# Patient Record
Sex: Female | Born: 1945 | Race: White | Hispanic: No | Smoking: Never smoker
Health system: Southern US, Community
[De-identification: ages and names within clinical notes are randomized; demographics above are authoritative.]

## PROBLEM LIST (undated history)

## (undated) DIAGNOSIS — Z95810 Presence of automatic (implantable) cardiac defibrillator: Secondary | ICD-10-CM

## (undated) DIAGNOSIS — I517 Cardiomegaly: Secondary | ICD-10-CM

## (undated) DIAGNOSIS — E039 Hypothyroidism, unspecified: Secondary | ICD-10-CM

## (undated) DIAGNOSIS — E785 Hyperlipidemia, unspecified: Secondary | ICD-10-CM

## (undated) DIAGNOSIS — J42 Unspecified chronic bronchitis: Secondary | ICD-10-CM

## (undated) DIAGNOSIS — F329 Major depressive disorder, single episode, unspecified: Secondary | ICD-10-CM

## (undated) DIAGNOSIS — N289 Disorder of kidney and ureter, unspecified: Secondary | ICD-10-CM

## (undated) DIAGNOSIS — K219 Gastro-esophageal reflux disease without esophagitis: Secondary | ICD-10-CM

## (undated) DIAGNOSIS — K589 Irritable bowel syndrome without diarrhea: Secondary | ICD-10-CM

## (undated) DIAGNOSIS — I340 Nonrheumatic mitral (valve) insufficiency: Secondary | ICD-10-CM

## (undated) DIAGNOSIS — F419 Anxiety disorder, unspecified: Secondary | ICD-10-CM

## (undated) DIAGNOSIS — Z8674 Personal history of sudden cardiac arrest: Secondary | ICD-10-CM

## (undated) DIAGNOSIS — I10 Essential (primary) hypertension: Secondary | ICD-10-CM

## (undated) DIAGNOSIS — R51 Headache: Secondary | ICD-10-CM

## (undated) DIAGNOSIS — J189 Pneumonia, unspecified organism: Secondary | ICD-10-CM

## (undated) DIAGNOSIS — I509 Heart failure, unspecified: Secondary | ICD-10-CM

## (undated) DIAGNOSIS — F32A Depression, unspecified: Secondary | ICD-10-CM

## (undated) DIAGNOSIS — R011 Cardiac murmur, unspecified: Secondary | ICD-10-CM

## (undated) DIAGNOSIS — M1990 Unspecified osteoarthritis, unspecified site: Secondary | ICD-10-CM

## (undated) DIAGNOSIS — I480 Paroxysmal atrial fibrillation: Secondary | ICD-10-CM

## (undated) DIAGNOSIS — M419 Scoliosis, unspecified: Secondary | ICD-10-CM

## (undated) HISTORY — DX: Nonrheumatic mitral (valve) insufficiency: I34.0

## (undated) HISTORY — PX: APPENDECTOMY: SHX54

## (undated) HISTORY — PX: COLONOSCOPY: SHX174

## (undated) HISTORY — PX: FRACTURE SURGERY: SHX138

## (undated) HISTORY — DX: Hyperlipidemia, unspecified: E78.5

## (undated) HISTORY — PX: CARDIAC CATHETERIZATION: SHX172

## (undated) HISTORY — PX: HERNIA REPAIR: SHX51

## (undated) HISTORY — DX: Paroxysmal atrial fibrillation: I48.0

## (undated) HISTORY — PX: ABDOMINAL HYSTERECTOMY: SHX81

---

## 2001-04-02 ENCOUNTER — Ambulatory Visit (HOSPITAL_COMMUNITY): Admission: RE | Admit: 2001-04-02 | Discharge: 2001-04-02 | Payer: Self-pay | Admitting: Internal Medicine

## 2001-04-02 ENCOUNTER — Encounter: Payer: Self-pay | Admitting: Internal Medicine

## 2001-10-10 ENCOUNTER — Emergency Department (HOSPITAL_COMMUNITY): Admission: EM | Admit: 2001-10-10 | Discharge: 2001-10-11 | Payer: Self-pay | Admitting: Emergency Medicine

## 2001-10-10 ENCOUNTER — Encounter: Payer: Self-pay | Admitting: Emergency Medicine

## 2002-01-20 ENCOUNTER — Encounter: Payer: Self-pay | Admitting: Internal Medicine

## 2002-01-20 ENCOUNTER — Encounter: Admission: RE | Admit: 2002-01-20 | Discharge: 2002-01-20 | Payer: Self-pay | Admitting: Internal Medicine

## 2002-02-28 ENCOUNTER — Other Ambulatory Visit: Admission: RE | Admit: 2002-02-28 | Discharge: 2002-02-28 | Payer: Self-pay | Admitting: Internal Medicine

## 2002-03-15 ENCOUNTER — Encounter: Payer: Self-pay | Admitting: Emergency Medicine

## 2002-03-15 ENCOUNTER — Emergency Department (HOSPITAL_COMMUNITY): Admission: EM | Admit: 2002-03-15 | Discharge: 2002-03-15 | Payer: Self-pay | Admitting: Emergency Medicine

## 2002-06-05 ENCOUNTER — Encounter: Admission: RE | Admit: 2002-06-05 | Discharge: 2002-06-05 | Payer: Self-pay | Admitting: Internal Medicine

## 2002-06-05 ENCOUNTER — Encounter: Payer: Self-pay | Admitting: Internal Medicine

## 2004-02-09 ENCOUNTER — Encounter: Admission: RE | Admit: 2004-02-09 | Discharge: 2004-02-09 | Payer: Self-pay | Admitting: Family Medicine

## 2004-06-24 ENCOUNTER — Ambulatory Visit: Payer: Self-pay | Admitting: *Deleted

## 2004-07-08 ENCOUNTER — Ambulatory Visit: Payer: Self-pay | Admitting: Family Medicine

## 2004-12-07 ENCOUNTER — Ambulatory Visit: Payer: Self-pay | Admitting: Internal Medicine

## 2005-03-02 ENCOUNTER — Encounter: Admission: RE | Admit: 2005-03-02 | Discharge: 2005-03-02 | Payer: Self-pay | Admitting: Nephrology

## 2006-01-05 ENCOUNTER — Ambulatory Visit: Payer: Self-pay | Admitting: Internal Medicine

## 2006-01-27 ENCOUNTER — Encounter: Admission: RE | Admit: 2006-01-27 | Discharge: 2006-01-27 | Payer: Self-pay | Admitting: Internal Medicine

## 2007-07-04 ENCOUNTER — Encounter (INDEPENDENT_AMBULATORY_CARE_PROVIDER_SITE_OTHER): Payer: Self-pay | Admitting: *Deleted

## 2007-11-09 ENCOUNTER — Inpatient Hospital Stay (HOSPITAL_COMMUNITY): Admission: EM | Admit: 2007-11-09 | Discharge: 2007-11-23 | Payer: Self-pay | Admitting: Emergency Medicine

## 2007-11-14 ENCOUNTER — Encounter (INDEPENDENT_AMBULATORY_CARE_PROVIDER_SITE_OTHER): Payer: Self-pay | Admitting: Cardiology

## 2008-06-24 ENCOUNTER — Inpatient Hospital Stay (HOSPITAL_COMMUNITY): Admission: EM | Admit: 2008-06-24 | Discharge: 2008-07-02 | Payer: Self-pay | Admitting: Emergency Medicine

## 2008-08-28 ENCOUNTER — Ambulatory Visit (HOSPITAL_COMMUNITY): Admission: RE | Admit: 2008-08-28 | Discharge: 2008-08-28 | Payer: Self-pay | Admitting: Nephrology

## 2008-10-17 HISTORY — PX: CARDIAC DEFIBRILLATOR PLACEMENT: SHX171

## 2009-05-07 ENCOUNTER — Encounter: Admission: RE | Admit: 2009-05-07 | Discharge: 2009-05-07 | Payer: Self-pay | Admitting: Nephrology

## 2009-09-02 ENCOUNTER — Ambulatory Visit: Payer: Self-pay | Admitting: Internal Medicine

## 2009-09-02 ENCOUNTER — Inpatient Hospital Stay (HOSPITAL_COMMUNITY): Admission: EM | Admit: 2009-09-02 | Discharge: 2009-09-13 | Payer: Self-pay | Admitting: Emergency Medicine

## 2009-09-03 ENCOUNTER — Encounter: Payer: Self-pay | Admitting: Pulmonary Disease

## 2009-09-04 ENCOUNTER — Encounter: Payer: Self-pay | Admitting: Internal Medicine

## 2009-09-11 ENCOUNTER — Encounter: Payer: Self-pay | Admitting: Internal Medicine

## 2009-10-17 DIAGNOSIS — Z8674 Personal history of sudden cardiac arrest: Secondary | ICD-10-CM

## 2009-10-17 HISTORY — DX: Personal history of sudden cardiac arrest: Z86.74

## 2009-10-23 ENCOUNTER — Encounter (INDEPENDENT_AMBULATORY_CARE_PROVIDER_SITE_OTHER): Payer: Self-pay | Admitting: *Deleted

## 2009-12-03 ENCOUNTER — Encounter: Admission: RE | Admit: 2009-12-03 | Discharge: 2009-12-03 | Payer: Self-pay | Admitting: Nephrology

## 2009-12-28 DIAGNOSIS — E785 Hyperlipidemia, unspecified: Secondary | ICD-10-CM | POA: Insufficient documentation

## 2009-12-28 DIAGNOSIS — I10 Essential (primary) hypertension: Secondary | ICD-10-CM | POA: Insufficient documentation

## 2009-12-28 DIAGNOSIS — K589 Irritable bowel syndrome without diarrhea: Secondary | ICD-10-CM | POA: Insufficient documentation

## 2009-12-28 DIAGNOSIS — E039 Hypothyroidism, unspecified: Secondary | ICD-10-CM

## 2009-12-28 DIAGNOSIS — I499 Cardiac arrhythmia, unspecified: Secondary | ICD-10-CM | POA: Insufficient documentation

## 2009-12-28 DIAGNOSIS — I509 Heart failure, unspecified: Secondary | ICD-10-CM | POA: Insufficient documentation

## 2010-04-20 ENCOUNTER — Ambulatory Visit: Payer: Self-pay | Admitting: Internal Medicine

## 2010-04-20 DIAGNOSIS — Z95810 Presence of automatic (implantable) cardiac defibrillator: Secondary | ICD-10-CM

## 2010-04-20 DIAGNOSIS — I5022 Chronic systolic (congestive) heart failure: Secondary | ICD-10-CM | POA: Insufficient documentation

## 2010-07-01 ENCOUNTER — Encounter: Admission: RE | Admit: 2010-07-01 | Discharge: 2010-07-01 | Payer: Self-pay | Admitting: Nephrology

## 2010-07-23 ENCOUNTER — Encounter: Payer: Self-pay | Admitting: Internal Medicine

## 2010-10-13 ENCOUNTER — Encounter (INDEPENDENT_AMBULATORY_CARE_PROVIDER_SITE_OTHER): Payer: Self-pay | Admitting: *Deleted

## 2010-11-12 ENCOUNTER — Encounter: Payer: Self-pay | Admitting: Internal Medicine

## 2010-11-18 NOTE — Letter (Signed)
Summary: Appointment - Missed  [facility], [place]  [address] [place]   [place], [place] [address]   Phone: [phone]  Fax: [phone]     October 23, 2009 MRN: [id number]   JOHNIKA BRIGNOLA [address] [place], [place]  [address]   Dear Ms. Mahaffy,  [place] records indicate you missed your appointment on 09/23/09 [facility].                                    It is very important that we reach you to reschedule this appointment. We look forward to participating in your health care needs. Please contact us at the number listed above at your earliest convenience to reschedule this appointment.     Sincerely,   Luanna Cole Scheduling Team

## 2010-11-18 NOTE — Letter (Signed)
Summary: Device-Delinquent Phone Proofreader, [place]  [address] [place]   [place], [place] [address]   Phone: [phone]  Fax: [phone]     October 13, 2010 MRN: [id number]   [name] [address] [place], [place]  [address]   Dear Ms. Faulcon,  According to our records, you were scheduled for a device phone transmission on  07-22-2010.     We did not receive any results from this check.  If you transmitted on your scheduled day, please call us to help troubleshoot your system.  If you forgot to send your transmission, please send one upon receipt of this letter.  Thank you,  Shelly Bombard  October 13, 2010 3:40 PM   [facility] certified

## 2010-11-18 NOTE — Assessment & Plan Note (Signed)
Summary: pc2  Medications Added * COREG  * SIMVASTATIN  HYDROXYZINE HCL 10 MG TABS (HYDROXYZINE HCL) Out      Allergies Added: ! CODEINE ! DEMEROL  Visit Type:  Follow-up   History of Present Illness: [name] returns today for followup.  She is a pleasant 65 yo woman with a h/o VF arrest, s/p ICD implant.  She denies c/p, sob, or peripheral edema.  No intercurrent ICD therapies.    Current Medications (verified): 1)  Coreg 2)  Simvastatin 3)  Hydroxyzine Hcl 10 Mg Tabs (Hydroxyzine Hcl) .... Out  Allergies (verified): 1)  ! Codeine 2)  ! Demerol  Past History:  Past Medical History: Last updated: 12/28/2009 Current Problems:  CARDIAC ARRHYTHMIA (ICD-427.9) HYPERLIPIDEMIA (ICD-272.4) HYPOTHYROIDISM (ICD-244.9) IBS (ICD-564.1) HYPERTENSION (ICD-401.9) CHF (ICD-428.0)    Review of Systems  The patient denies chest pain, syncope, dyspnea on exertion, and peripheral edema.    Vital Signs:  Patient profile:   65 year old female Height:      64 inches Weight:      171 pounds BMI:     29.46 Pulse rate:   71 / minute BP sitting:   106 / 72  (left arm)  Vitals Entered By: Margaretmary Bayley CMA (April 20, 2010 10:10 AM)  Physical Exam  General:  Middle aged, well developed, well nourished, in no acute distress.  HEENT: normal Neck: supple. No JVD. Carotids 2+ bilaterally no bruits Cor: RRR no rubs, gallops or murmur Lungs: CTA. well healed ICD insertion. Ab: soft, nontender. nondistended. No HSM. Good bowel sounds Ext: warm. no cyanosis, clubbing or edema Neuro: alert and oriented. Grossly nonfocal. affect pleasant     ICD Specifications Following MD:  Cristopher Peru, MD     Referring MD:  [facility] ICD Vendor:  [employer]     ICD Model Number:  [id number]     ICD Serial Number:  [id number] ICD DOI:  09/09/2009     ICD Implanting MD:  Cristopher Peru, MD  Lead 1:    Location: RV     DOI: 09/09/2009     Model #: [id number]     Serial #: [id number]     Status: active  Indications::   VF ARREST   ICD Follow Up Remote Check?  No Charge Time:  8.6 seconds     Battery Est. Longevity:  9.1 years Underlying rhythm:  SR ICD Dependent:  No       ICD Device Measurements Right Ventricle:  Amplitude: 12 mV, Impedance: 430 ohms, Threshold: 0.75 V at 0.4 msec Shock Impedance: 41 ohms   Episodes Coumadin:  No Shock:  0     ATP:  0     Nonsustained:  0     Ventricular Pacing:  0%  Brady Parameters Mode VVI     Lower Rate Limit:  40      Tachy Zones VF:  187     Next Remote Date:  07/22/2010     Next Cardiology Appt Due:  04/17/2011 Tech Comments:  RV reprogrammed 2.5@0 .4.  Merlin transmissions every 3 months.  ROV 1 year with Dr. Lovena Le.   Alma Friendly, LPN  July  5, 624THL 075-GRM AM  MD Comments:  Agree with above.  Impression & Recommendations:  Problem # 1:  AUTOMATIC IMPLANTABLE CARDIAC DEFIBRILLATOR SITU (ICD-V45.02) Her device is working normally.  will recheck in several months.  Problem # 2:  CHRONIC SYSTOLIC HEART FAILURE (123456) Her symptoms remain class 2.  A low  sodium diet is recommended.   The patient did not bring her meds in today, and I have asked her to bring her meds in or call us.  Problem # 3:  HYPERLIPIDEMIA (P102836.4) Will ask her to continue a low fat diet.

## 2010-11-18 NOTE — Cardiovascular Report (Signed)
Summary: Office Visit   Office Visit   Imported By: Sallee Provencal 04/21/2010 15:40:05  _____________________________________________________________________  External Attachment:    Type:   Image     Comment:   External Document

## 2010-11-18 NOTE — Letter (Signed)
Summary: Device-Delinquent Phone Proofreader, [place]  [address] [place]   [place], [place] [address]   Phone: [phone]  Fax: [phone]     July 23, 2010 MRN: [id number]   [name] [address] [place],   [address]   Dear [name],  According to our records, you were scheduled for a device phone transmission on 07-22-2010.     We did not receive any results from this check.  If you transmitted on your scheduled day, please call us to help troubleshoot your system.  If you forgot to send your transmission, please send one upon receipt of this letter.  Thank you,   [facility]

## 2010-11-24 NOTE — Cardiovascular Report (Signed)
Summary: Certified Letter Returned - Not doing f/u  Certified Letter Returned - Not doing f/u   Imported By: Ranell Patrick 11/16/2010 15:18:02  _____________________________________________________________________  External Attachment:    Type:   Image     Comment:   External Document

## 2010-12-09 ENCOUNTER — Ambulatory Visit
Admission: RE | Admit: 2010-12-09 | Discharge: 2010-12-09 | Disposition: A | Discharge status: Home / Self Care | Payer: Medicaid Other | Source: Ambulatory Visit | Attending: Nephrology | Admitting: Nephrology

## 2010-12-09 ENCOUNTER — Other Ambulatory Visit: Payer: Self-pay | Admitting: Nephrology

## 2010-12-09 DIAGNOSIS — R05 Cough: Secondary | ICD-10-CM

## 2011-01-19 LAB — PROTIME-INR
INR: 1.14 (ref 0.00–1.49)
Prothrombin Time: 14.5 seconds (ref 11.6–15.2)

## 2011-01-19 LAB — BASIC METABOLIC PANEL
BUN: 10 mg/dL (ref 6–23)
BUN: 11 mg/dL (ref 6–23)
BUN: 14 mg/dL (ref 6–23)
BUN: 14 mg/dL (ref 6–23)
BUN: 15 mg/dL (ref 6–23)
BUN: 18 mg/dL (ref 6–23)
BUN: 4 mg/dL — ABNORMAL LOW (ref 6–23)
BUN: 5 mg/dL — ABNORMAL LOW (ref 6–23)
BUN: 7 mg/dL (ref 6–23)
BUN: 9 mg/dL (ref 6–23)
CO2: 18 mEq/L — ABNORMAL LOW (ref 19–32)
CO2: 18 mEq/L — ABNORMAL LOW (ref 19–32)
CO2: 21 mEq/L (ref 19–32)
CO2: 21 mEq/L (ref 19–32)
CO2: 22 mEq/L (ref 19–32)
CO2: 24 mEq/L (ref 19–32)
CO2: 33 mEq/L — ABNORMAL HIGH (ref 19–32)
Calcium: 7.6 mg/dL — ABNORMAL LOW (ref 8.4–10.5)
Calcium: 7.8 mg/dL — ABNORMAL LOW (ref 8.4–10.5)
Calcium: 7.9 mg/dL — ABNORMAL LOW (ref 8.4–10.5)
Calcium: 8 mg/dL — ABNORMAL LOW (ref 8.4–10.5)
Calcium: 8.3 mg/dL — ABNORMAL LOW (ref 8.4–10.5)
Calcium: 8.4 mg/dL (ref 8.4–10.5)
Calcium: 8.5 mg/dL (ref 8.4–10.5)
Calcium: 8.5 mg/dL (ref 8.4–10.5)
Chloride: 102 mEq/L (ref 96–112)
Chloride: 106 mEq/L (ref 96–112)
Chloride: 107 mEq/L (ref 96–112)
Chloride: 109 mEq/L (ref 96–112)
Chloride: 109 mEq/L (ref 96–112)
Chloride: 109 mEq/L (ref 96–112)
Chloride: 111 mEq/L (ref 96–112)
Creatinine, Ser: 0.6 mg/dL (ref 0.4–1.2)
Creatinine, Ser: 0.64 mg/dL (ref 0.4–1.2)
Creatinine, Ser: 0.67 mg/dL (ref 0.4–1.2)
Creatinine, Ser: 1.23 mg/dL — ABNORMAL HIGH (ref 0.4–1.2)
Creatinine, Ser: 1.26 mg/dL — ABNORMAL HIGH (ref 0.4–1.2)
Creatinine, Ser: 1.28 mg/dL — ABNORMAL HIGH (ref 0.4–1.2)
GFR calc Af Amer: 49 mL/min — ABNORMAL LOW (ref >60)
GFR calc Af Amer: 52 mL/min — ABNORMAL LOW (ref >60)
GFR calc Af Amer: >60 mL/min (ref >60)
GFR calc Af Amer: >60 mL/min (ref >60)
GFR calc Af Amer: >60 mL/min (ref >60)
GFR calc Af Amer: >60 mL/min (ref >60)
GFR calc Af Amer: >60 mL/min (ref >60)
GFR calc non Af Amer: 40 mL/min — ABNORMAL LOW (ref >60)
GFR calc non Af Amer: 41 mL/min — ABNORMAL LOW (ref >60)
GFR calc non Af Amer: 42 mL/min — ABNORMAL LOW (ref >60)
GFR calc non Af Amer: 44 mL/min — ABNORMAL LOW (ref >60)
GFR calc non Af Amer: 46 mL/min — ABNORMAL LOW (ref >60)
GFR calc non Af Amer: 47 mL/min — ABNORMAL LOW (ref >60)
GFR calc non Af Amer: 49 mL/min — ABNORMAL LOW (ref >60)
GFR calc non Af Amer: 50 mL/min — ABNORMAL LOW (ref >60)
GFR calc non Af Amer: >60 mL/min (ref >60)
GFR calc non Af Amer: >60 mL/min (ref >60)
GFR calc non Af Amer: >60 mL/min (ref >60)
Glucose, Bld: 106 mg/dL — ABNORMAL HIGH (ref 70–99)
Glucose, Bld: 111 mg/dL — ABNORMAL HIGH (ref 70–99)
Glucose, Bld: 112 mg/dL — ABNORMAL HIGH (ref 70–99)
Glucose, Bld: 117 mg/dL — ABNORMAL HIGH (ref 70–99)
Glucose, Bld: 126 mg/dL — ABNORMAL HIGH (ref 70–99)
Glucose, Bld: 184 mg/dL — ABNORMAL HIGH (ref 70–99)
Glucose, Bld: 198 mg/dL — ABNORMAL HIGH (ref 70–99)
Glucose, Bld: 255 mg/dL — ABNORMAL HIGH (ref 70–99)
Glucose, Bld: 94 mg/dL (ref 70–99)
Glucose, Bld: 96 mg/dL (ref 70–99)
Glucose, Bld: 99 mg/dL (ref 70–99)
Potassium: 3.7 mEq/L (ref 3.5–5.1)
Potassium: 3.8 mEq/L (ref 3.5–5.1)
Potassium: 3.8 mEq/L (ref 3.5–5.1)
Potassium: 3.9 mEq/L (ref 3.5–5.1)
Potassium: 3.9 mEq/L (ref 3.5–5.1)
Potassium: 4 mEq/L (ref 3.5–5.1)
Potassium: 4.1 mEq/L (ref 3.5–5.1)
Potassium: 4.1 mEq/L (ref 3.5–5.1)
Potassium: 4.2 mEq/L (ref 3.5–5.1)
Potassium: 4.5 mEq/L (ref 3.5–5.1)
Potassium: 4.7 mEq/L (ref 3.5–5.1)
Sodium: 134 mEq/L — ABNORMAL LOW (ref 135–145)
Sodium: 135 mEq/L (ref 135–145)
Sodium: 136 mEq/L (ref 135–145)
Sodium: 137 mEq/L (ref 135–145)
Sodium: 137 mEq/L (ref 135–145)
Sodium: 138 mEq/L (ref 135–145)
Sodium: 138 mEq/L (ref 135–145)
Sodium: 138 mEq/L (ref 135–145)
Sodium: 138 mEq/L (ref 135–145)
Sodium: 139 mEq/L (ref 135–145)
Sodium: 143 mEq/L (ref 135–145)

## 2011-01-19 LAB — COMPREHENSIVE METABOLIC PANEL
ALT: 688 U/L — ABNORMAL HIGH (ref 0–35)
CO2: 20 mEq/L (ref 19–32)
Calcium: 8.2 mg/dL — ABNORMAL LOW (ref 8.4–10.5)
GFR calc non Af Amer: 55 mL/min — ABNORMAL LOW (ref >60)
Glucose, Bld: 108 mg/dL — ABNORMAL HIGH (ref 70–99)
Sodium: 137 mEq/L (ref 135–145)

## 2011-01-19 LAB — BRAIN NATRIURETIC PEPTIDE: Pro B Natriuretic peptide (BNP): 1483 pg/mL — ABNORMAL HIGH (ref 0.0–100.0)

## 2011-01-19 LAB — MAGNESIUM
Magnesium: 1.8 mg/dL (ref 1.5–2.5)
Magnesium: 1.9 mg/dL (ref 1.5–2.5)
Magnesium: 1.9 mg/dL (ref 1.5–2.5)
Magnesium: 1.9 mg/dL (ref 1.5–2.5)
Magnesium: 2.2 mg/dL (ref 1.5–2.5)
Magnesium: 2.2 mg/dL (ref 1.5–2.5)
Magnesium: 2.3 mg/dL (ref 1.5–2.5)
Magnesium: 2.3 mg/dL (ref 1.5–2.5)

## 2011-01-19 LAB — CROSSMATCH: ABO/RH(D): O POS

## 2011-01-19 LAB — HEPATIC FUNCTION PANEL
Alkaline Phosphatase: 76 U/L (ref 39–117)
Bilirubin, Direct: 0.2 mg/dL (ref 0.0–0.3)
Indirect Bilirubin: 0.8 mg/dL (ref 0.3–0.9)
Total Bilirubin: 1 mg/dL (ref 0.3–1.2)

## 2011-01-19 LAB — DIFFERENTIAL
Basophils Absolute: 0 10*3/uL (ref 0.0–0.1)
Basophils Absolute: 0 10*3/uL (ref 0.0–0.1)
Basophils Relative: 0 % (ref 0–1)
Eosinophils Absolute: 0 10*3/uL (ref 0.0–0.7)
Eosinophils Absolute: 0.1 10*3/uL (ref 0.0–0.7)
Eosinophils Relative: 1 % (ref 0–5)
Lymphocytes Relative: 22 % (ref 12–46)
Monocytes Relative: 12 % (ref 3–12)
Neutro Abs: 4.3 10*3/uL (ref 1.7–7.7)
Neutrophils Relative %: 78 % — ABNORMAL HIGH (ref 43–77)

## 2011-01-19 LAB — BLOOD GAS, ARTERIAL
Acid-base deficit: 4.3 mmol/L — ABNORMAL HIGH (ref 0.0–2.0)
Acid-base deficit: 4.7 mmol/L — ABNORMAL HIGH (ref 0.0–2.0)
Acid-base deficit: 6.1 mmol/L — ABNORMAL HIGH (ref 0.0–2.0)
Bicarbonate: 18.4 mEq/L — ABNORMAL LOW (ref 20.0–24.0)
Bicarbonate: 19.8 mEq/L — ABNORMAL LOW (ref 20.0–24.0)
Bicarbonate: 20.5 mEq/L (ref 20.0–24.0)
FIO2: 0.3 %
FIO2: 0.8 %
MECHVT: 500 mL
MECHVT: 500 mL
MECHVT: 500 mL
O2 Saturation: 94.8 %
O2 Saturation: 99.8 %
PEEP: 5 cmH2O
Patient temperature: 91.4
Patient temperature: 98.6
RATE: 12 resp/min
TCO2: 19.6 mmol/L (ref 0–100)
TCO2: 21.7 mmol/L (ref 0–100)
pCO2 arterial: 28.8 mmHg — ABNORMAL LOW (ref 35.0–45.0)
pCO2 arterial: 36.4 mmHg (ref 35.0–45.0)
pH, Arterial: 7.401 — ABNORMAL HIGH (ref 7.350–7.400)
pO2, Arterial: 191 mmHg — ABNORMAL HIGH (ref 80.0–100.0)
pO2, Arterial: 73.7 mmHg — ABNORMAL LOW (ref 80.0–100.0)

## 2011-01-19 LAB — URINE CULTURE: Special Requests: NEGATIVE

## 2011-01-19 LAB — GLUCOSE, CAPILLARY
Glucose-Capillary: 101 mg/dL — ABNORMAL HIGH (ref 70–99)
Glucose-Capillary: 106 mg/dL — ABNORMAL HIGH (ref 70–99)
Glucose-Capillary: 109 mg/dL — ABNORMAL HIGH (ref 70–99)
Glucose-Capillary: 109 mg/dL — ABNORMAL HIGH (ref 70–99)
Glucose-Capillary: 112 mg/dL — ABNORMAL HIGH (ref 70–99)
Glucose-Capillary: 114 mg/dL — ABNORMAL HIGH (ref 70–99)
Glucose-Capillary: 115 mg/dL — ABNORMAL HIGH (ref 70–99)
Glucose-Capillary: 117 mg/dL — ABNORMAL HIGH (ref 70–99)
Glucose-Capillary: 129 mg/dL — ABNORMAL HIGH (ref 70–99)
Glucose-Capillary: 129 mg/dL — ABNORMAL HIGH (ref 70–99)
Glucose-Capillary: 132 mg/dL — ABNORMAL HIGH (ref 70–99)
Glucose-Capillary: 136 mg/dL — ABNORMAL HIGH (ref 70–99)
Glucose-Capillary: 139 mg/dL — ABNORMAL HIGH (ref 70–99)
Glucose-Capillary: 147 mg/dL — ABNORMAL HIGH (ref 70–99)
Glucose-Capillary: 150 mg/dL — ABNORMAL HIGH (ref 70–99)
Glucose-Capillary: 153 mg/dL — ABNORMAL HIGH (ref 70–99)
Glucose-Capillary: 171 mg/dL — ABNORMAL HIGH (ref 70–99)
Glucose-Capillary: 187 mg/dL — ABNORMAL HIGH (ref 70–99)
Glucose-Capillary: 206 mg/dL — ABNORMAL HIGH (ref 70–99)
Glucose-Capillary: 206 mg/dL — ABNORMAL HIGH (ref 70–99)
Glucose-Capillary: 244 mg/dL — ABNORMAL HIGH (ref 70–99)
Glucose-Capillary: 271 mg/dL — ABNORMAL HIGH (ref 70–99)
Glucose-Capillary: 67 mg/dL — ABNORMAL LOW (ref 70–99)
Glucose-Capillary: 67 mg/dL — ABNORMAL LOW (ref 70–99)
Glucose-Capillary: 82 mg/dL (ref 70–99)
Glucose-Capillary: 91 mg/dL (ref 70–99)
Glucose-Capillary: 94 mg/dL (ref 70–99)
Glucose-Capillary: 97 mg/dL (ref 70–99)
Glucose-Capillary: 97 mg/dL (ref 70–99)
Glucose-Capillary: 98 mg/dL (ref 70–99)
Glucose-Capillary: 99 mg/dL (ref 70–99)

## 2011-01-19 LAB — CBC
HCT: 24 % — ABNORMAL LOW (ref 36.0–46.0)
HCT: 24.5 % — ABNORMAL LOW (ref 36.0–46.0)
HCT: 24.7 % — ABNORMAL LOW (ref 36.0–46.0)
HCT: 27.1 % — ABNORMAL LOW (ref 36.0–46.0)
HCT: 27.2 % — ABNORMAL LOW (ref 36.0–46.0)
HCT: 29 % — ABNORMAL LOW (ref 36.0–46.0)
HCT: 29.7 % — ABNORMAL LOW (ref 36.0–46.0)
HCT: 31.6 % — ABNORMAL LOW (ref 36.0–46.0)
HCT: 31.8 % — ABNORMAL LOW (ref 36.0–46.0)
Hemoglobin: 10.1 g/dL — ABNORMAL LOW (ref 12.0–15.0)
Hemoglobin: 10.9 g/dL — ABNORMAL LOW (ref 12.0–15.0)
Hemoglobin: 11 g/dL — ABNORMAL LOW (ref 12.0–15.0)
Hemoglobin: 8.3 g/dL — ABNORMAL LOW (ref 12.0–15.0)
Hemoglobin: 8.6 g/dL — ABNORMAL LOW (ref 12.0–15.0)
Hemoglobin: 9.3 g/dL — ABNORMAL LOW (ref 12.0–15.0)
Hemoglobin: 9.3 g/dL — ABNORMAL LOW (ref 12.0–15.0)
MCHC: 34.7 g/dL (ref 30.0–36.0)
MCHC: 34.7 g/dL (ref 30.0–36.0)
MCHC: 34.9 g/dL (ref 30.0–36.0)
MCHC: 35 g/dL (ref 30.0–36.0)
MCV: 94.6 fL (ref 78.0–100.0)
MCV: 95.7 fL (ref 78.0–100.0)
MCV: 96.4 fL (ref 78.0–100.0)
MCV: 97.3 fL (ref 78.0–100.0)
MCV: 97.4 fL (ref 78.0–100.0)
MCV: 97.8 fL (ref 78.0–100.0)
MCV: 98.1 fL (ref 78.0–100.0)
Platelets: 118 10*3/uL — ABNORMAL LOW (ref 150–400)
Platelets: 150 10*3/uL (ref 150–400)
Platelets: 172 10*3/uL (ref 150–400)
Platelets: 209 10*3/uL (ref 150–400)
Platelets: 216 10*3/uL (ref 150–400)
Platelets: 253 10*3/uL (ref 150–400)
Platelets: 306 10*3/uL (ref 150–400)
RBC: 2.22 MIL/uL — ABNORMAL LOW (ref 3.87–5.11)
RBC: 2.98 MIL/uL — ABNORMAL LOW (ref 3.87–5.11)
RDW: 13.2 % (ref 11.5–15.5)
RDW: 13.5 % (ref 11.5–15.5)
RDW: 13.8 % (ref 11.5–15.5)
RDW: 15.1 % (ref 11.5–15.5)
RDW: 15.3 % (ref 11.5–15.5)
RDW: 15.5 % (ref 11.5–15.5)
WBC: 5.6 10*3/uL (ref 4.0–10.5)
WBC: 6.5 10*3/uL (ref 4.0–10.5)
WBC: 6.6 10*3/uL (ref 4.0–10.5)
WBC: 7.7 10*3/uL (ref 4.0–10.5)
WBC: 7.9 10*3/uL (ref 4.0–10.5)
WBC: 8 10*3/uL (ref 4.0–10.5)

## 2011-01-19 LAB — URINE MICROSCOPIC-ADD ON

## 2011-01-19 LAB — APTT: aPTT: 28 seconds (ref 24–37)

## 2011-01-19 LAB — POCT I-STAT, CHEM 8
BUN: 12 mg/dL (ref 6–23)
Calcium, Ion: 1.16 mmol/L (ref 1.12–1.32)
Creatinine, Ser: 0.8 mg/dL (ref 0.4–1.2)
Glucose, Bld: 100 mg/dL — ABNORMAL HIGH (ref 70–99)
TCO2: 21 mmol/L (ref 0–100)

## 2011-01-19 LAB — PHOSPHORUS
Phosphorus: 3 mg/dL (ref 2.3–4.6)
Phosphorus: 3.1 mg/dL (ref 2.3–4.6)
Phosphorus: 3.2 mg/dL (ref 2.3–4.6)
Phosphorus: 3.2 mg/dL (ref 2.3–4.6)
Phosphorus: 3.4 mg/dL (ref 2.3–4.6)
Phosphorus: 3.4 mg/dL (ref 2.3–4.6)
Phosphorus: 3.4 mg/dL (ref 2.3–4.6)
Phosphorus: 4.5 mg/dL (ref 2.3–4.6)

## 2011-01-19 LAB — URINALYSIS, ROUTINE W REFLEX MICROSCOPIC
Glucose, UA: 500 mg/dL — AB
Ketones, ur: NEGATIVE mg/dL
Leukocytes, UA: NEGATIVE
Nitrite: NEGATIVE
Specific Gravity, Urine: 1.029 (ref 1.005–1.030)
pH: 5 (ref 5.0–8.0)

## 2011-01-19 LAB — ABO/RH: ABO/RH(D): O POS

## 2011-01-19 LAB — CARDIAC PANEL(CRET KIN+CKTOT+MB+TROPI)
CK, MB: 8.7 ng/mL — ABNORMAL HIGH (ref 0.3–4.0)
Troponin I: 0.05 ng/mL (ref 0.00–0.06)

## 2011-01-19 LAB — CULTURE, RESPIRATORY W GRAM STAIN

## 2011-01-19 LAB — CULTURE, BLOOD (ROUTINE X 2): Culture: NO GROWTH

## 2011-01-19 LAB — CK TOTAL AND CKMB (NOT AT ARMC): Relative Index: 5.5 — ABNORMAL HIGH (ref 0.0–2.5)

## 2011-01-19 LAB — LEGIONELLA ANTIGEN, URINE: Legionella Antigen, Urine: NEGATIVE

## 2011-01-19 LAB — HEMOCCULT GUIAC POC 1CARD (OFFICE): Fecal Occult Bld: NEGATIVE

## 2011-01-19 LAB — D-DIMER, QUANTITATIVE

## 2011-01-19 LAB — CLOSTRIDIUM DIFFICILE EIA

## 2011-01-19 LAB — LACTIC ACID, PLASMA: Lactic Acid, Venous: 1.2 mmol/L (ref 0.5–2.2)

## 2011-02-24 ENCOUNTER — Other Ambulatory Visit: Payer: Self-pay | Admitting: Nephrology

## 2011-02-24 ENCOUNTER — Ambulatory Visit
Admission: RE | Admit: 2011-02-24 | Discharge: 2011-02-24 | Disposition: A | Discharge status: Home / Self Care | Payer: Medicaid Other | Source: Ambulatory Visit | Attending: Nephrology | Admitting: Nephrology

## 2011-02-24 DIAGNOSIS — R05 Cough: Secondary | ICD-10-CM

## 2011-03-01 NOTE — Cardiovascular Report (Signed)
[name], [name]                ACCOUNT NO.:  [id number]   MEDICAL RECORD NO.:  [id number]          PATIENT TYPE:  INP   LOCATION:  33                         FACILITY:  [facility]   PHYSICIAN:  Mohan N. Terrence Dupont, M.D. DATE OF BIRTH:  January 19, 1946   DATE OF PROCEDURE:  DATE OF DISCHARGE:                            CARDIAC CATHETERIZATION   PROCEDURE:  Left cardiac catheterization with selective left and right  coronary angiography, left ventriculography via right groin using  Judkins technique.   INDICATIONS FOR PROCEDURE:  Ms. [name] is 65 year old white female with  past medical history significant for hypertension, hypothyroidism,  irritable bowel syndrome, history of CHF secondary to systolic heart  failure, hypercholesteremia, and scoliosis was admitted on June 24, 2008, following syncopal episode.  Cardiology consult is called as the  patient had episodes of bradycardia with heart rate in 30s and  occasional pause of 2.14 second.  The patient states she has been  feeling dizzy off and on for last few days as if room was spinning, took  Antivert with relief and then subsequently had syncopal episode.  The  patient also complains of dull aching retrosternal and right-sided chest  pain off and on with exertion relieves with aspirin.  States lately she  feels tired, fatigued with minimal exertion.  Denies any PND, orthopnea,  or leg swelling.  The patient was noted to be on beta blockers and dig,  although her dig level was in therapeutic range.  EKG showed normal  sinus bradycardia with LVH with strain pattern versus anterolateral wall  ischemic changes.  Due to multiple risk factors, chest pain, history of  heart failure, and abnormal EKG, discussed with the patient regarding  left cath, possible PTCA stenting, its risks and benefits i.e. death,  MI, stroke, need for emergency CABG, risk of restenosis, local vascular  complications, etc., and consented for the procedure.   PROCEDURE:  After obtaining the informed consent, the patient was  brought to the cath lab and was placed on fluoroscopy table.  Right  groin was prepped and draped in usual fashion.  A 2% Xylocaine was used  for local anesthesia in the right groin.  With the help of thin-wall  needle, 6-French arterial sheath was placed.  The sheath was aspirated  and flushed.  Next, 6-French left Judkins catheter was advanced over the  wire under fluoroscopic guidance up to the ascending aorta.  Wire was  pulled out.  The catheter was aspirated and connected to the manifold.  Catheter was further advanced and engaged into left coronary ostium.  Multiple views of the left system were taken.  Next, the catheter was  disengaged and was pulled out over the wire and was replaced with 6-  [place] right Judkins catheter, which was advanced over the wire under  fluoroscopic guidance up to the ascending aorta.  Wire was pulled out.  The catheter was aspirated and connected to the manifold.  Catheter was  further advanced and engaged into right coronary ostium.  Multiple views  of the right system were taken.  Next, the catheter was disengaged and  was pulled out over the wire and was replaced with 6-French pigtail  catheter, which was advanced over the wire under fluoroscopic guidance  up to the ascending aorta.  Wire was pulled out.  The catheter was  aspirated and connected to the manifold.  Catheter was further advanced  across the aortic valve into the LV.  LV pressures were recorded.  Next,  LV graft was done in 30 degrees RAO position.  Post angiographic  pressures were recorded from LV and then pullback pressures were  recorded from the aorta.  There was no gradient across the aortic valve.  Next, pigtail catheter was pulled out over the wire.  Sheaths were  aspirated and flushed.   FINDINGS:  LV showed mild global hypokinesia, EF of 45%-50%.  There was  2+ MR.  Left main was long which was patent.  LAD  was patent proximally  and in midportion, distally it tapers down before reaching apex.  Diagonal 1  to 3 were small which were patent.  Ramus was small which was patent.  Left circumflex was patent.  RCA was large which has 10%-15% proximal  and distal stenosis.  PDA and PLV branches were patent.  The patient  tolerated procedure well.  There are no complications.  The patient was  transferred to recovery room in stable condition.      [name]. Terrence Dupont, M.D.  Electronically Signed     MNH/MEDQ  D:  07/01/2008  T:  07/01/2008  Job:  [id number]   cc:   Jessica Minors, MD  Cath Lab

## 2011-03-01 NOTE — H&P (Signed)
NAMEMACLYNN, [name]                ACCOUNT NO.:  [id number]   MEDICAL RECORD NO.:  [id number]          PATIENT TYPE:  INP   LOCATION:  [id number]                         FACILITY:  [facility]   PHYSICIAN:  Benito Mccreedy, M.D.DATE OF BIRTH:  1946-07-21   DATE OF ADMISSION:  06/24/2008  DATE OF DISCHARGE:                              HISTORY & PHYSICAL   CHIEF COMPLAINT:  Syncope.   HISTORY OF PRESENT ILLNESS:  The patient is a 65 year old lady who  presented with 1-day history of syncope.  She has been feeling dizzy  with vertiginous component.  She said she passed out for an unknown  period.  No known bowel or bladder incontinence.  There was no  antecedent chest pain or shortness of breath but had felt little bit  nauseated, diaphoresis, and vomited post syncopal episode.  No extremity  weakness, headaches, or visual changes.  No abdominal pain.  No diarrhea  or constipation.  She apparently has been treated for some sort of  respiratory illness by PCP, Dr. __________ with Avelox a couple of days  ago.  On account of her syncopal episode the patient came to the ED  whereupon she was seen by the ED physician.  A 12-lead EKG was obtained,  which was negative for any acute changes/ischemic changes.   White count was 6.5, hemoglobin 12.3, hematocrit 36.2, MCV 97.6, and  platelet count 224.  Protime was 13.2, INR 1.0, and PTT 32.  Sodium 136,  potassium 4.0, chloride 97, CO2 of 28, glucose 111, BUN 11, creatinine  0.8, and calcium 9.4.  Total CPK was 43, CK-MB was 1.8, and troponin I  was 0.03, and B-type natriuretic peptide was 335.  The patient's digoxin  level was 0.9, and UA was negative for evidence of UTI.  She had an x-  ray of her chest done, which showed cardiomegaly without acute  infiltrates.  The patient was admitted for further evaluation and  management of syncope.   PAST MEDICAL HISTORY:  Positive for history of,  1. CHF.  2. Hypertension.  3. Irritable bowel syndrome.  4.  Hypothyroidism.  5. Hyperlipidemia.  6. Cardiac dysrhythmia.   MEDICATIONS:  The patient is on,  1. Levothyroxine 88 mcg daily.  2. Metoprolol 25 mg b.i.d.  3. Lasix 40 mg daily.  4. Digoxin 0.25 mg daily.  5. She is also supposed to be on aspirin 325 mg daily.  6. Atacand 8 mg daily.   ALLERGIES:  The patient is allergic to CODEINE and to MEDROL, reactions  unclear.   FAMILY HISTORY:  Positive for heart disease.   SOCIAL HISTORY:  The patient does not smoke cigarette and not drink  alcohol.   REVIEW OF SYSTEMS:  Significant positive/negatives as listed above in  the HPI, otherwise unremarkable.   PHYSICAL EXAMINATION:  VITAL SIGNS:  BP of 141/64, pulse 74,  respirations 20, and temperature 97.4 degrees Fahrenheit.  GENERAL:  Not in acute distress.  HEENT:  Oropharynx moist.  __________ present.  NECK:  Supple.  No JVD.  LUNGS:  __________ sounds at the bases.  CARDIAC:  Regular.  No gallops.  ABDOMEN:  Soft.  Bowel sounds present.  EXTREMITIES:  No cyanosis.  NEUROLOGIC:  She is alert and oriented x3.   The labs as mentioned above.   Plus/minus syncope, etiology unclear in a patient with significant  cardiac history.  The patient admitted for cardiac monitoring, serial  cardiac enzymes, and orthostatic checks.  Possible cardiology consult  and further recommendations made as __________.      Benito Mccreedy, M.D.  Electronically Signed     GO/MEDQ  D:  06/24/2008  T:  06/24/2008  Job:  [id number]

## 2011-03-01 NOTE — Consult Note (Signed)
NAMESHARNAE, Moyer                ACCOUNT NO.:  [id number]   MEDICAL RECORD NO.:  [id number]          PATIENT TYPE:  INP   LOCATION:  [id number]                         FACILITY:  [facility]   PHYSICIAN:  Felizardo Hoffmann, M.D.  DATE OF BIRTH:  01-17-1946   DATE OF CONSULTATION:  06/27/2008  DATE OF DISCHARGE:                                 CONSULTATION   REASON FOR CONSULTATION:  Depression.   HISTORY OF PRESENT ILLNESS:  [name] is a 65 year old female  admitted to the [facility] on June 24, 2008 due to chest pain and  syncope.   [name] describes a effective treatment with Paxil for depression.  She has been having some depressed mood associated with worrying about  her physical condition.  However, she does describe intact interests as  well as future hope.  She does not have any thoughts of harming herself  or others.  She does not have any delusions or hallucinations.  Her  orientation and memory function are intact.  She expressed her greatest  worry and dysphoria this morning.   This afternoon she is expressing mainly worry about her brain.  She does  describe a number of headaches and is concerned, and she wants a brain  image.   In terms of the consistency of her mood state, she describes regular  intact interests and energy as well as normal mood.  She is not  describing any adverse effects from Paxil.   PAST PSYCHIATRIC HISTORY:  The patient developed depression several  years ago and was treated with Zoloft, which was associated with an  exacerbation of her irritable bowel syndrome.   Paxil was started for anti depression and anti panic with excellent  results.   The patient does have a history of excessive worry, feeling on edge.  She also describes short lived periods of intense anxiety that have  resolved with Paxil.   FAMILY PSYCHIATRIC HISTORY:  None known.   SOCIAL HISTORY:  [name] has a supportive husband.  She lives at  home with him.   She has a son at home.  She also has a Loss adjuster, chartered [place].   She does not use any alcohol or illegal drugs.   PAST MEDICAL HISTORY:  Congestive heart failure, hypertension, irritable  well syndrome, hypothyroidism, cardiac dysrhythmia.   MEDICATIONS:  The MAR is reviewed.  Psychotropics include:  1. Valium 2 mg t.i.d. for anxiety.  2. Synthroid 88 mcg daily.  3. Paxil 40 mg daily.  4. Ambien 10 mg nightly.   ALLERGIES:  CODEINE, DEMEROL.   TSH unremarkable.   REVIEW OF SYSTEMS:  Unremarkable.   EXAMINATION:  VITAL SIGNS:  Temperature 97.9, pulse 62, respiratory rate  20, blood pressure 130/70, O2 saturation on room air 94%.   MENTAL STATUS EXAM:  [name] is alert.  She is oriented to all  spheres.  Concentration within normal limits.  Affect mildly anxious.  Mood mildly anxious.  Memory intact.  Speech normal.  Thought process  logical, coherent, goal-directed.  No looseness of associations.  Thought content, no thoughts of harming herself,  no thoughts of harming  others, no delusions, no hallucinations.  Insight is intact.  Judgment  is intact.   ASSESSMENT:  AXIS I:  1. 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  2. 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  AXIS II:  None.  AXIS III:  See past medical history.  AXIS IV:  General medical.  AXIS V:  55.   [name] is not at risk to harm herself or others.  She agrees to  call emergency services immediately for any thoughts of harming herself,  thoughts of harming others, or other emergency symptoms.   The undersigned provided ego supportive psychotherapy and education.   The indications, alternatives and adverse effects of Paxil were  discussed.  She understands and wants to proceed with Paxil.   RECOMMENDATIONS:  1. No change in the Paxil for anti depression/anti anxiety.  2. The patient could greatly benefit from the case  manager helping her      to set up psychotherapy including cognitive behavioral therapy,      deep breathing, and progressive muscle relaxation.  3. While at a head CT could involve a negative therapeutic effect of      providing form of a compulsion for the patient, it could mainly      provide a positive therapeutic benefit of reinforcing a scientific      cognition for the patient that she could utilize and cognitive      behavioral therapy for example, the doctors have provided a      scientific evaluation which reinforces that it is unlikely that I      have a general medical source of my symptoms.  Cognition such as      this reinforces reason and counteracts catastrophic thinking which      can reduce anxiety.  4. Therefore, if there is general medical justification for a head CT,      for example, given her headache pattern, it would provide      psychologic benefit.      Felizardo Hoffmann, M.D.  Electronically Signed    JW/MEDQ  D:  06/29/2008  T:  06/29/2008  Job:  [id number]

## 2011-03-04 NOTE — Discharge Summary (Signed)
[name], [name]                ACCOUNT NO.:  [id number]   MEDICAL RECORD NO.:  [id number]          PATIENT TYPE:  INP   LOCATION:  2038                         FACILITY:  [facility]   PHYSICIAN:  Melburn Hake, M.D.DATE OF BIRTH:  [birth year]   DATE OF ADMISSION:  11/09/2007  DATE OF DISCHARGE:  11/23/2007                               DISCHARGE SUMMARY   ADMISSION DIAGNOSES:  Shortness of breath, coughing.   DISCHARGE DIAGNOSES:  1. Bronchitis, coughing, and also some congestive heart failure and      cardiomegaly.  2. Hypertension.  3. Pneumonia.  4. Irritable bowel syndrome.  5. Hypothyroidism.  6. Hyperlipidemia.  7. Cardiac dysrhythmia.   BRIEF HISTORY/PHYSICAL/HOSPITAL COURSE:  Ms. [name] is a 65-year-  old white female who resides with her husband.  She had been in the  office several days prior to admission with coughing and some evidence  of bronchitis.  The chest x-ray at that time did show some cardiomegaly  but no evidence of congestive heart failure at that time.  However, in  the emergency room, chest x-ray did show some congestive failure and  cardiomegaly.  She had been treated with Norvasc on blood pressure and a  Proventil inhaler.   Did show that she had a history also of hypothyroid, and she had been on  Synthroid.  She had run out of the Synthroid and had been off for a time  when we saw her.  The patient lives with her husband, who has severe  COPD.  The patient's mother died as a diabetic, and father deceased with  hypertension.  She has one brother who has liver disease, he is an  alcoholic.  She has a sister with coronary artery disease and another  brother who is a hypertensive.   PHYSICAL EXAMINATION:  Temp 98.4, pulse 92, respirations 18, blood  pressure 133/88, 99% saturation on 2 liters.  HEENT:  She has some caries in her teeth.  CHEST:  Some slight rales at the bases, regular sinus rhythm.  ABDOMEN:  Soft.  Active bowel sounds.  She  can move all of her extremities.  She had no leg edema.   Patient had a hysterectomy at the age of 44.   Patient has some congestive failure, cardiomegaly, and bronchitis.   Patient did have a chest x-ray with some evidence of left lower lobe  pneumonia.   Hemoglobin on admission, she had a BNP of 2095.  She had some congestive  heart failure, some pneumonia, some hypertension, and hypothyroidism.  She complained of some constipation also.   The patient was admitted and put to bedrest.  We gave her some Lasix and  Toprol XL 6.25 mg b.i.d.  Lasix 40 mg daily.  Put her on some  prophylaxis, Lovenox.  She was started on Lanoxin.  In the hospital, we  had her on Altace 2.5 mg daily, Lopressor 25 mg b.i.d., and the  cardiologist put her on those and an aspirin.  Because of her  constipation, she got a soapsuds enema until clear.  We tried a Fleet  enema  also.  Diflucan 150 mg for 11 days.  Miconazole vaginal cream for  fungus daily.  Started on some PT/OT.  The patient started having a  cough on Altace, and we stopped that and put her on Atacand 8 mg daily  and Allegra 110 mg daily.   Because of her depression, we started her on some Paxil 40 mg daily,  Synthroid 88 mcg daily, and gave her an Advair inhaler 100/50 1 puff  twice daily.  The patient had been started on Cipro, and we had been  treating her with Cipro.  She was on Zofran for nausea, 4 mg q.6h. as  needed for nausea.  She had been the Cipro for most of her stay for her  left lower lobe pneumonia.   The patient began to feel better.  Her shortness of breath improved.  Her depression seemingly got some better.  We discharged her on Paxil 40  mg a day.  We did not want to keep her on an ACE because it caused her  to cough, so we have put her on an ARB.  We are sending her home on  Lopressor 25 mg b.i.d. and Atacand 8 mg daily, and that is the ARB we  are going to send her home on.  Lasix 40 mg daily.  We may reduce that  to  every other day.  She stated that she would not need the Monistat,  she was not having any more vaginal itching or discharge.  Synthroid we  are going to keep her on, 88 mcg daily.  The Advair inhaler 100/50 1  puff b.i.d.  We will keep her on a baby aspirin 81 mg daily and  Phenergan 25 mg p.r.n. nausea.   The patient is to return to the office in about two weeks and see Dr.  Terrence Dupont in about two weeks as a cardiology outpatient.           ______________________________  Melburn Hake, M.D.     CEF/MEDQ  D:  12/22/2007  T:  12/22/2007  Job:  [id number]

## 2011-03-04 NOTE — Discharge Summary (Signed)
NAMEMARLAINA, MATURINO                ACCOUNT NO.:  [id number]   MEDICAL RECORD NO.:  [id number]          PATIENT TYPE:  INP   LOCATION:  [id number]                         FACILITY:  [facility]   PHYSICIAN:  Melburn Hake, M.D.DATE OF BIRTH:  09-15-1946   DATE OF ADMISSION:  06/24/2008  DATE OF DISCHARGE:  07/02/2008                               DISCHARGE SUMMARY   ADMITTING DIAGNOSIS:  Circulatory collapse.   DISCHARGE DIAGNOSES:  1. Circulatory collapse.  2. Systolic heart failure, unspecified.  3. Congestive heart failure.  4. Hypothyroidism.  5. Hypertension.  6. Irritable colon.  7. Hyperlipidemia and some chronic depression.   BRIEF HISTORY AND PHYSICAL AND HOSPITAL COURSE:  Ms. [name] is a  65 year old white female.  She was brought to the emergency room earlier  this morning after passing out at home.  She lives with her husband.  She stated that the room started spinning last Saturday, and she got  severe headache, some loss of balance, and occasional nausea.  She  denies shortness of breath or chest pain.  The patient has been admitted  in January 2009 with symptoms of chest pain, she has worked up for  congestive heart failure and coronary artery disease.  On this  admission, she has not complained of chest pain.  She states that after  the last episode of dizziness at home, she woke up in [facility]  Emergency Room.  In the admission, she had briefly marked bradycardia  and some congestive heart failure.  She has a history of irritable bowel  syndrome.   PHYSICAL EXAMINATION:  VITAL SIGNS:  Temperature of 98.1, pulse of 67,  respirations were 18, the blood pressure 142/78, and 94% on room air O2.  CHEST:  Clear to auscultation and percussion.  Regular sinus rhythm.  She has had some labyrinthitis for which she has been treated with some  diazepam and also she had some congestive heart failure and decrease in  her ejection fraction of heart.  The patient has been  very depressed and  we restarted her Paxil and her Synthroid for her hypothyroidism and we  will give her diazepam for labyrinthitis.   PERTINENT LABS:  C-reactive protein is 1.0, which was slightly elevated.  TSH, check of her thyroid was 3.538 which is in the normal range.  She  is on Synthroid.  CK-MB were all negative.  Lipids showed increase in  her cholesterol 265.  Glucose has been slightly elevated, not too bad.  Remainder of her labs have been fairly stable.   The therapeutic CT of the brain showed no specific abnormality.  The  patient did get cardiac cath by Dr. Terrence Dupont and the left ventricle  showed mild global hypokinesis.  The ejection fraction of 45-50% and 2+  mitral regurgitation.  The left main was patent, LAD was patent  proximally in the mid portion, distally it tapered down before reaching  the apex.  Diagonal 1 to 3 were small, which were patent.  Ramus was  small, which was patent.  The left circumflex was patent.  The right  carotid  artery was large which has 15% proximal and distal stenosis.  PDA and PLV branches were patent.  The patient was otherwise normal.   So, after giving the CT of the brain with no acute changes and cardiac  cath, which was completed and medical treatment recommended Coreg 3.12  mg every morning.  The patient states she feels better, but she still  gets some headaches, seemed to be helped by Tylenol.  The patient has  had some low hemoglobin and is complained of being fatigue from time-to-  time, some hypokalemia in the past.  The patient felt some better and  she has had some decrease in hemoglobin and so we will work that up as  an outpatient, so we will increase the Coreg that she was on from 3.125  daily to 3.125 b.i.d., lisinopril 10 mg daily, and Crestor 20 mg daily  was added by Dr. Terrence Dupont.  We will continue the patient on aspirin and  diazepam 2 mg t.i.d., Flexeril 10 mg b.i.d., and Tylenol as needed for  pain, Tessalon Perles  for cough and that seems to help her, Paxil 40 mg  daily and Advair inhaler 250/50 one puff twice a day, Lasix 40 mg daily,  and Synthroid 88 mcg daily.  The patient states that __________stomach  and so we would work up and follow the hemoglobin as an outpatient.  The  patient was discharged to home.  She should to return back to the office  in 2 weeks.           ______________________________  Melburn Hake, M.D.     CEF/MEDQ  D:  10/23/2008  T:  10/24/2008  Job:  [id number]

## 2011-07-08 LAB — RENAL FUNCTION PANEL
Albumin: 3.4 — ABNORMAL LOW
Albumin: 3.5
Albumin: 3.7
Albumin: 4
Albumin: 4.2
BUN: 13
BUN: 15
CO2: 30
CO2: 30
Calcium: 8.7
Calcium: 9
Calcium: 9.7
Chloride: 101
Chloride: 101
Chloride: 100
Chloride: 98
Creatinine, Ser: 0.87
Creatinine, Ser: 0.96
GFR calc Af Amer: >60
GFR calc Af Amer: >60
GFR calc Af Amer: >60
GFR calc Af Amer: >60
GFR calc non Af Amer: >60
GFR calc non Af Amer: >60
GFR calc non Af Amer: >60
GFR calc non Af Amer: >60
Glucose, Bld: 95
Glucose, Bld: 104 — ABNORMAL HIGH
Phosphorus: 4.3
Phosphorus: 4.6
Phosphorus: 4.7 — ABNORMAL HIGH
Phosphorus: 4.8 — ABNORMAL HIGH
Potassium: 4.2
Potassium: 4.4
Potassium: 4.6
Sodium: 137
Sodium: 138
Sodium: 138
Sodium: 139

## 2011-07-08 LAB — DIFFERENTIAL
Basophils Absolute: 0
Basophils Absolute: 0
Basophils Absolute: 0
Basophils Absolute: 0
Basophils Absolute: 0
Basophils Relative: 0
Basophils Relative: 0
Basophils Relative: 0
Basophils Relative: 1
Basophils Relative: 1
Eosinophils Absolute: 0
Eosinophils Absolute: 0.1
Eosinophils Absolute: 0.2
Eosinophils Relative: 4
Eosinophils Relative: 5
Lymphocytes Relative: 26
Lymphocytes Relative: 32
Lymphocytes Relative: 36
Lymphocytes Relative: 24
Lymphocytes Relative: 25
Lymphs Abs: 0.3 — ABNORMAL LOW
Lymphs Abs: 1
Lymphs Abs: 1.4
Monocytes Absolute: 0.4
Monocytes Absolute: 0.4
Monocytes Absolute: 0.4
Monocytes Absolute: 0.5
Monocytes Relative: 4
Monocytes Relative: 7
Monocytes Relative: 8
Monocytes Relative: 9
Neutro Abs: 1.7
Neutro Abs: 3.4
Neutro Abs: 6.7
Neutro Abs: 3.5
Neutrophils Relative %: 46
Neutrophils Relative %: 49
Neutrophils Relative %: 61
Neutrophils Relative %: 64
Neutrophils Relative %: 88 — ABNORMAL HIGH
Neutrophils Relative %: 92 — ABNORMAL HIGH

## 2011-07-08 LAB — COMPREHENSIVE METABOLIC PANEL
ALT: 163 — ABNORMAL HIGH
AST: 130 — ABNORMAL HIGH
Albumin: 3.5
Albumin: 3.8
Alkaline Phosphatase: 67
Alkaline Phosphatase: 69
BUN: 8
Calcium: 8.2 — ABNORMAL LOW
Calcium: 8.7
Creatinine, Ser: 0.8
GFR calc Af Amer: >60
GFR calc Af Amer: >60
Glucose, Bld: 122 — ABNORMAL HIGH
Glucose, Bld: 131 — ABNORMAL HIGH
Potassium: 3.6
Potassium: 4
Sodium: 134 — ABNORMAL LOW
Sodium: 137
Total Protein: 6.3
Total Protein: 6.5

## 2011-07-08 LAB — URINALYSIS, ROUTINE W REFLEX MICROSCOPIC
Hgb urine dipstick: NEGATIVE
Leukocytes, UA: NEGATIVE
Nitrite: NEGATIVE
Specific Gravity, Urine: 1.031 — ABNORMAL HIGH
Urobilinogen, UA: 1

## 2011-07-08 LAB — CBC
HCT: 30.6 — ABNORMAL LOW
HCT: 37.1
HCT: 38.8
HCT: 38.9
Hemoglobin: 10.3 — ABNORMAL LOW
Hemoglobin: 10.3 — ABNORMAL LOW
Hemoglobin: 10.6 — ABNORMAL LOW
Hemoglobin: 11.7 — ABNORMAL LOW
Hemoglobin: 12.3
Hemoglobin: 13.1
MCHC: 33.2
MCHC: 33.4
MCHC: 33.4
MCHC: 33.6
MCHC: 33.7
MCV: 95.4
MCV: 96.2
MCV: 97.1
Platelets: 181
Platelets: 186
Platelets: 200
Platelets: 215
Platelets: 275
Platelets: 293
RBC: 3.17 — ABNORMAL LOW
RBC: 3.38 — ABNORMAL LOW
RBC: 3.8 — ABNORMAL LOW
RBC: 3.89
RBC: 4.04
RDW: 15.4
RDW: 15.4
RDW: 15.6 — ABNORMAL HIGH
RDW: 15.6 — ABNORMAL HIGH
RDW: 15.6 — ABNORMAL HIGH
RDW: 15.7 — ABNORMAL HIGH
RDW: 14.3
WBC: 3.1 — ABNORMAL LOW
WBC: 3.8 — ABNORMAL LOW
WBC: 5.5
WBC: 5.7

## 2011-07-08 LAB — CK TOTAL AND CKMB (NOT AT ARMC)
CK, MB: 1.7
Relative Index: INVALID
Relative Index: INVALID
Relative Index: INVALID
Total CK: 47
Total CK: 50

## 2011-07-08 LAB — URINE MICROSCOPIC-ADD ON

## 2011-07-08 LAB — TROPONIN I
Troponin I: 0.03
Troponin I: 0.05
Troponin I: 0.13 — ABNORMAL HIGH

## 2011-07-08 LAB — AFB CULTURE WITH SMEAR (NOT AT ARMC): Acid Fast Smear: NONE SEEN

## 2011-07-08 LAB — BASIC METABOLIC PANEL
CO2: 25
Calcium: 8.7
Creatinine, Ser: 0.7
GFR calc Af Amer: >60

## 2011-07-08 LAB — TSH: TSH: 0.776

## 2011-07-08 LAB — LIPID PANEL
Cholesterol: 123
LDL Cholesterol: 81
Triglycerides: 73

## 2011-07-08 LAB — B-NATRIURETIC PEPTIDE (CONVERTED LAB)
Pro B Natriuretic peptide (BNP): 1795 — ABNORMAL HIGH
Pro B Natriuretic peptide (BNP): 2091 — ABNORMAL HIGH
Pro B Natriuretic peptide (BNP): 264 — ABNORMAL HIGH
Pro B Natriuretic peptide (BNP): 268 — ABNORMAL HIGH
Pro B Natriuretic peptide (BNP): 584 — ABNORMAL HIGH

## 2011-07-08 LAB — CARDIAC PANEL(CRET KIN+CKTOT+MB+TROPI)
CK, MB: 1.7
Relative Index: INVALID
Total CK: 82
Troponin I: 0.08 — ABNORMAL HIGH

## 2011-07-08 LAB — URINE CULTURE: Colony Count: NO GROWTH

## 2011-07-08 LAB — T4, FREE: Free T4: 1.14

## 2011-07-08 LAB — MAGNESIUM: Magnesium: 2.2

## 2011-07-18 LAB — CBC
HCT: 30.1 — ABNORMAL LOW
Hemoglobin: 10.4 — ABNORMAL LOW
MCHC: 34.6
MCV: 95.8
RDW: 13.1

## 2011-07-18 LAB — DIFFERENTIAL
Basophils Absolute: 0
Basophils Relative: 0
Eosinophils Absolute: 0.1
Eosinophils Relative: 3
Monocytes Absolute: 0.3
Monocytes Relative: 7

## 2011-07-18 LAB — RENAL FUNCTION PANEL
Albumin: 3.8
BUN: 14
Creatinine, Ser: 0.78
Glucose, Bld: 101 — ABNORMAL HIGH
Phosphorus: 4.3

## 2011-07-18 LAB — CARDIAC PANEL(CRET KIN+CKTOT+MB+TROPI): Total CK: 50

## 2011-07-20 LAB — DIFFERENTIAL
Basophils Absolute: 0
Basophils Relative: 0
Eosinophils Absolute: 0.1
Eosinophils Absolute: 0.1
Eosinophils Absolute: 0.2
Eosinophils Relative: 2
Lymphocytes Relative: 24
Lymphocytes Relative: 29
Lymphs Abs: 1.6
Lymphs Abs: 1.6
Monocytes Absolute: 0.6
Monocytes Relative: 7
Monocytes Relative: 8
Neutro Abs: 4.8
Neutrophils Relative %: 62
Neutrophils Relative %: 74

## 2011-07-20 LAB — CBC
HCT: 31.4 — ABNORMAL LOW
HCT: 31.7 — ABNORMAL LOW
HCT: 33.3 — ABNORMAL LOW
HCT: 33.4 — ABNORMAL LOW
HCT: 33.7 — ABNORMAL LOW
Hemoglobin: 10.8 — ABNORMAL LOW
Hemoglobin: 10.9 — ABNORMAL LOW
Hemoglobin: 11.3 — ABNORMAL LOW
Hemoglobin: 11.4 — ABNORMAL LOW
MCHC: 34
MCHC: 34
MCHC: 34.3
MCV: 95.9
MCV: 96.3
MCV: 97.6
MCV: 98
Platelets: 193
Platelets: 210
Platelets: 211
Platelets: 224
Platelets: 235
RBC: 3.26 — ABNORMAL LOW
RBC: 3.39 — ABNORMAL LOW
RBC: 3.48 — ABNORMAL LOW
RBC: 3.71 — ABNORMAL LOW
RDW: 13.6
RDW: 13.6
RDW: 13.6
WBC: 6.5
WBC: 6.5
WBC: 6.9
WBC: 9

## 2011-07-20 LAB — RENAL FUNCTION PANEL
Albumin: 4.1
BUN: 17
CO2: 25
Calcium: 9.1
Calcium: 9.3
Chloride: 102
Chloride: 103
Creatinine, Ser: 0.84
GFR calc Af Amer: >60
GFR calc Af Amer: >60
GFR calc non Af Amer: >60
Glucose, Bld: 99
Sodium: 138

## 2011-07-20 LAB — COMPREHENSIVE METABOLIC PANEL
ALT: 17
ALT: 22
AST: 34
Albumin: 3.9
Alkaline Phosphatase: 69
Alkaline Phosphatase: 71
Alkaline Phosphatase: 71
BUN: 10
BUN: 14
CO2: 26
CO2: 27
Calcium: 9.1
Calcium: 9.1
GFR calc Af Amer: >60
GFR calc non Af Amer: >60
GFR calc non Af Amer: >60
Glucose, Bld: 101 — ABNORMAL HIGH
Glucose, Bld: 103 — ABNORMAL HIGH
Glucose, Bld: 106 — ABNORMAL HIGH
Potassium: 4.3
Potassium: 4.6
Sodium: 137
Sodium: 139
Sodium: 140
Total Protein: 6.9

## 2011-07-20 LAB — URINE MICROSCOPIC-ADD ON

## 2011-07-20 LAB — BASIC METABOLIC PANEL
BUN: 11
BUN: 20
Chloride: 100
Chloride: 97
Creatinine, Ser: 0.89
Creatinine, Ser: 0.98
GFR calc Af Amer: >60
GFR calc non Af Amer: 58 — ABNORMAL LOW
GFR calc non Af Amer: >60
Glucose, Bld: 102 — ABNORMAL HIGH
Potassium: 4
Potassium: 4.4

## 2011-07-20 LAB — CK TOTAL AND CKMB (NOT AT ARMC)
CK, MB: 1.9
Relative Index: INVALID
Total CK: 57

## 2011-07-20 LAB — CARDIAC PANEL(CRET KIN+CKTOT+MB+TROPI)
CK, MB: 1.5
CK, MB: 1.8
Relative Index: INVALID
Total CK: 42
Total CK: 43
Total CK: 48
Troponin I: 0.03

## 2011-07-20 LAB — EXTRACTABLE NUCLEAR ANTIGEN ANTIBODY
ENA SM Ab Ser-aCnc: <0.2 AI (ref <1.0)
Ribonucleic Protein(ENA) Antibody, IgG: <0.2 AI (ref <1.0)
SSA (Ro) (ENA) Antibody, IgG: <0.2 AI (ref <1.0)
SSB (La) (ENA) Antibody, IgG: <0.2 AI (ref <1.0)
Scleroderma (Scl-70) (ENA) Antibody, IgG: <0.2 AI (ref <1.0)

## 2011-07-20 LAB — DIGOXIN LEVEL
Digoxin Level: 0.9
Digoxin Level: 0.9

## 2011-07-20 LAB — C-REACTIVE PROTEIN: CRP: 1 — ABNORMAL HIGH (ref <0.6)

## 2011-07-20 LAB — PROTIME-INR
INR: 1
Prothrombin Time: 13.2

## 2011-07-20 LAB — TSH: TSH: 3.538

## 2011-07-20 LAB — LIPID PANEL
HDL: 49
Total CHOL/HDL Ratio: 5.4
VLDL: 36

## 2011-07-20 LAB — SEDIMENTATION RATE: Sed Rate: 30 — ABNORMAL HIGH

## 2011-07-20 LAB — URINALYSIS, ROUTINE W REFLEX MICROSCOPIC
Bilirubin Urine: NEGATIVE
Glucose, UA: NEGATIVE
Hgb urine dipstick: NEGATIVE
Protein, ur: 100 — AB
Urobilinogen, UA: 0.2

## 2011-07-20 LAB — B-NATRIURETIC PEPTIDE (CONVERTED LAB): Pro B Natriuretic peptide (BNP): 335 — ABNORMAL HIGH

## 2011-09-29 ENCOUNTER — Other Ambulatory Visit: Payer: Self-pay | Admitting: Nephrology

## 2011-09-29 ENCOUNTER — Ambulatory Visit
Admission: RE | Admit: 2011-09-29 | Discharge: 2011-09-29 | Disposition: A | Discharge status: Home / Self Care | Payer: Medicaid Other | Source: Ambulatory Visit | Attending: Nephrology | Admitting: Nephrology

## 2011-09-29 DIAGNOSIS — R05 Cough: Secondary | ICD-10-CM

## 2012-05-07 ENCOUNTER — Telehealth: Payer: Self-pay | Admitting: Internal Medicine

## 2012-05-07 NOTE — Telephone Encounter (Signed)
New msg Pt said her defib shocked her on Tues and hasnt happened again. She wanted to talk to you

## 2012-05-08 NOTE — Telephone Encounter (Signed)
LMOM in regards to defib shocking pt

## 2012-05-09 NOTE — Telephone Encounter (Signed)
LMOM for pt to return call/ [phone]

## 2012-05-10 NOTE — Telephone Encounter (Signed)
Pt scheduled for Dr Lovena Le on 05-25-12 @ 1115 for icd shock

## 2012-05-25 ENCOUNTER — Encounter: Payer: Self-pay | Admitting: Internal Medicine

## 2012-05-25 ENCOUNTER — Ambulatory Visit (INDEPENDENT_AMBULATORY_CARE_PROVIDER_SITE_OTHER): Payer: Self-pay | Admitting: Internal Medicine

## 2012-05-25 VITALS — BP 111/76 | HR 67 | Ht 64.0 in | Wt 173.8 lb

## 2012-05-25 DIAGNOSIS — I5022 Chronic systolic (congestive) heart failure: Secondary | ICD-10-CM

## 2012-05-25 DIAGNOSIS — Z95810 Presence of automatic (implantable) cardiac defibrillator: Secondary | ICD-10-CM

## 2012-05-25 DIAGNOSIS — I499 Cardiac arrhythmia, unspecified: Secondary | ICD-10-CM

## 2012-05-25 LAB — ICD DEVICE OBSERVATION
BRDY-0002RV: 40 {beats}/min
DEV-0020ICD: NEGATIVE
DEVICE MODEL ICD: 745406
RV LEAD AMPLITUDE: 12 mv
RV LEAD AMPLITUDE: 12 mv
RV LEAD IMPEDENCE ICD: 362.5 Ohm
RV LEAD THRESHOLD: 0.75 V
TOT-0007: 3
TOT-0008: 0
TOT-0009: 1
TOT-0010: 12
TOT-0010: 12
TZAT-0001SLOWVT: 1
TZAT-0004SLOWVT: 8
TZAT-0019SLOWVT: 7.5 V
TZON-0004SLOWVT: 40
TZON-0010SLOWVT: 40 ms
TZST-0001SLOWVT: 3
TZST-0001SLOWVT: 4
TZST-0003SLOWVT: 25 J
TZST-0003SLOWVT: 40 J
VF: 0

## 2012-05-25 NOTE — Progress Notes (Signed)
HPI [name] returns today for followup. She is a very pleasant 66 year old woman with a nonischemic cardiomyopathy, status post ICD implantation. In the interim, she has been stable. She denies chest pain or shortness of breath. She has had one shock to her defibrillator secondary to atrial fibrillation with a rapid ventricular response. Prior to the episode, she felt palpitations and some minimal dyspnea but no other symptoms. She had her dose of carvedilol up titrated by Dr. Terrence Dupont. She denies syncope. Allergies  Allergen Reactions  . Codeine   . Meperidine Hcl      Current Outpatient Prescriptions  Medication Sig Dispense Refill  . ARIPiprazole (ABILIFY) 5 MG tablet Take 5 mg by mouth daily.      . carvedilol (COREG) 12.5 MG tablet Take 12.5 mg by mouth 2 (two) times daily.      . diazepam (VALIUM) 2 MG tablet Take 2 mg by mouth every 6 (six) hours as needed.      . hydrOXYzine (ATARAX/VISTARIL) 10 MG tablet Take 10 mg by mouth daily.      Marland Kitchen levothyroxine (SYNTHROID, LEVOTHROID) 88 MCG tablet Take 88 mcg by mouth daily.      Marland Kitchen lisinopril (PRINIVIL,ZESTRIL) 10 MG tablet Take 10 mg by mouth 2 (two) times daily.      . Pancrelipase, Lip-Prot-Amyl, (CREON PO) Take by mouth 3 (three) times daily with meals.      . pantoprazole (PROTONIX) 40 MG tablet Take 40 mg by mouth daily.      Marland Kitchen PAROXETINE HCL PO Take 30 mg by mouth daily. Two tablets daily      . simvastatin (ZOCOR) 40 MG tablet Take 40 mg by mouth at bedtime.      . sulindac (CLINORIL) 200 MG tablet Take 200 mg by mouth 2 (two) times daily with a meal.         No past medical history on file.  ROS:   All systems reviewed and negative except as noted in the HPI.   No past surgical history on file.   No family history on file.   History   Social History  . Marital Status: Widowed    Spouse Name: N/A    Number of Children: N/A  . Years of Education: N/A   Occupational History  . Not on file.   Social History  Main Topics  . Smoking status: Never Smoker   . Smokeless tobacco: Never Used  . Alcohol Use: Not on file  . Drug Use: Not on file  . Sexually Active: Not on file   Other Topics Concern  . Not on file   Social History Narrative  . No narrative on file     BP 111/76  Pulse 67  Ht 5\' 4"  (1.626 m)  Wt 173 lb 12.8 oz (78.835 kg)  BMI 29.83 kg/m2  Physical Exam:  Well appearing middle-aged woman, NAD HEENT: Unremarkable Neck:  No JVD, no thyromegally Lungs:  Clear with no wheezes, rales, or rhonchi. Well-healed ICD incision. HEART:  Regular rate rhythm, no murmurs, no rubs, no clicks Abd:  soft, positive bowel sounds, no organomegally, no rebound, no guarding Ext:  2 plus pulses, no edema, no cyanosis, no clubbing Skin:  No rashes no nodules Neuro:  CN II through XII intact, motor grossly intact  DEVICE  Normal device function.  See PaceArt for details.   Assess/Plan:

## 2012-05-25 NOTE — Patient Instructions (Signed)
Your physician recommends that you schedule a follow-up appointment in: 3 months with device clinic

## 2012-05-25 NOTE — Assessment & Plan Note (Addendum)
Her [employer] single chamber ICD is working normally. We'll plan to recheck in several months. She did receive an ICD shock for atrial fibrillation. Today we reprogrammed her ICD to reduce the likelihood of unnecessary shocks.

## 2012-05-25 NOTE — Assessment & Plan Note (Signed)
Her symptoms are class II. She will continue her current medical therapy and maintain a low-sodium diet.

## 2012-06-28 ENCOUNTER — Other Ambulatory Visit: Payer: Self-pay

## 2012-06-28 ENCOUNTER — Emergency Department (HOSPITAL_COMMUNITY)
Admission: EM | Admit: 2012-06-28 | Discharge: 2012-06-28 | Disposition: A | Discharge status: Home / Self Care | Payer: Medicare Other | Attending: Emergency Medicine | Admitting: Emergency Medicine

## 2012-06-28 ENCOUNTER — Emergency Department (HOSPITAL_COMMUNITY): Payer: Medicare Other

## 2012-06-28 ENCOUNTER — Encounter (HOSPITAL_COMMUNITY): Payer: Self-pay

## 2012-06-28 DIAGNOSIS — I498 Other specified cardiac arrhythmias: Secondary | ICD-10-CM | POA: Diagnosis not present

## 2012-06-28 DIAGNOSIS — I517 Cardiomegaly: Secondary | ICD-10-CM | POA: Insufficient documentation

## 2012-06-28 DIAGNOSIS — R5381 Other malaise: Secondary | ICD-10-CM | POA: Diagnosis not present

## 2012-06-28 DIAGNOSIS — I4891 Unspecified atrial fibrillation: Secondary | ICD-10-CM | POA: Insufficient documentation

## 2012-06-28 DIAGNOSIS — Z95810 Presence of automatic (implantable) cardiac defibrillator: Secondary | ICD-10-CM | POA: Diagnosis not present

## 2012-06-28 DIAGNOSIS — I499 Cardiac arrhythmia, unspecified: Secondary | ICD-10-CM | POA: Diagnosis not present

## 2012-06-28 DIAGNOSIS — R5383 Other fatigue: Secondary | ICD-10-CM | POA: Insufficient documentation

## 2012-06-28 DIAGNOSIS — I10 Essential (primary) hypertension: Secondary | ICD-10-CM | POA: Diagnosis not present

## 2012-06-28 DIAGNOSIS — T85698A Other mechanical complication of other specified internal prosthetic devices, implants and grafts, initial encounter: Secondary | ICD-10-CM | POA: Diagnosis not present

## 2012-06-28 DIAGNOSIS — R079 Chest pain, unspecified: Secondary | ICD-10-CM | POA: Diagnosis not present

## 2012-06-28 HISTORY — DX: Unspecified osteoarthritis, unspecified site: M19.90

## 2012-06-28 HISTORY — DX: Essential (primary) hypertension: I10

## 2012-06-28 LAB — BASIC METABOLIC PANEL
CO2: 24 mEq/L (ref 19–32)
Chloride: 101 mEq/L (ref 96–112)
Glucose, Bld: 103 mg/dL — ABNORMAL HIGH (ref 70–99)
Potassium: 4.5 mEq/L (ref 3.5–5.1)
Sodium: 137 mEq/L (ref 135–145)

## 2012-06-28 MED ORDER — METOPROLOL SUCCINATE ER 25 MG PO TB24: 50.0000 mg | ORAL_TABLET | Freq: Every day | ORAL | Status: DC

## 2012-06-28 MED ORDER — METOPROLOL SUCCINATE ER 50 MG PO TB24
50.0000 mg | ORAL_TABLET | ORAL | Status: DC
  Filled 2012-06-28: qty 1

## 2012-06-28 MED ORDER — METOPROLOL SUCCINATE ER 50 MG PO TB24
50.0000 mg | ORAL_TABLET | Freq: Every day | ORAL | Status: DC
  Administered 2012-06-28: 50 mg via ORAL

## 2012-06-28 MED ORDER — METOPROLOL SUCCINATE ER 50 MG PO TB24
50.0000 mg | ORAL_TABLET | Freq: Every day | ORAL | Status: DC
  Filled 2012-06-28: qty 1

## 2012-06-28 NOTE — ED Notes (Signed)
Family at bedside. 

## 2012-06-28 NOTE — ED Provider Notes (Signed)
History     CSN: [id number]  Arrival date & time 06/28/12  77   First MD Initiated Contact with Patient 06/28/12 1714      Chief Complaint  Patient presents with  . ICD fired     (Consider location/radiation/quality/duration/timing/severity/associated sxs/prior treatment) The history is provided by the patient.   patient states that she was at the grocery store and felt her AICD fire. She states it felt as if she got hit in head. She did not feel dizzy or feels her heart racing before. The lightheadedness or dizziness. She's had a recent episode where she had to get her AICD adjusted do to it firing due to A. fib. She sees Dr. Lovena Le. No chest pain. No cough.  Past Medical History  Diagnosis Date  . Arthritis   . Hypertension   . A-fib   . High cholesterol     Past Surgical History  Procedure Date  . Cardiac defibrillator placement   . Abdominal hysterectomy   . Hernia repair as child    x 2    History reviewed. No pertinent family history.  History  Substance Use Topics  . Smoking status: Never Smoker   . Smokeless tobacco: Never Used  . Alcohol Use: No    OB History    Grav Para Term Preterm Abortions TAB SAB Ect Mult Living                  Review of Systems  Constitutional: Positive for fatigue. Negative for activity change and appetite change.  HENT: Negative for neck stiffness.   Eyes: Negative for pain.  Respiratory: Negative for chest tightness and shortness of breath.   Cardiovascular: Negative for chest pain and leg swelling.  Gastrointestinal: Negative for nausea, vomiting, abdominal pain and diarrhea.  Genitourinary: Negative for flank pain.  Musculoskeletal: Negative for back pain.  Skin: Negative for rash.  Neurological: Negative for weakness, numbness and headaches.  Psychiatric/Behavioral: Negative for behavioral problems.    Allergies  Codeine and Meperidine hcl  Home Medications   Current Outpatient Rx  Name Route Sig Dispense  Refill  . ARIPIPRAZOLE 5 MG PO TABS Oral Take 5 mg by mouth daily.    . ASPIRIN 81 MG PO CHEW Oral Chew 81 mg by mouth as needed. For chest pain    . GOODY HEADACHE PO Oral Take 1 packet by mouth as needed. For pain    . CARVEDILOL 12.5 MG PO TABS Oral Take 12.5 mg by mouth 2 (two) times daily.    Marland Kitchen DIAZEPAM 2 MG PO TABS Oral Take 2 mg by mouth every 6 (six) hours as needed. For anxiety    . OMEGA-3 FATTY ACIDS 1000 MG PO CAPS Oral Take 1 g by mouth daily.    Marland Kitchen HYDROXYZINE HCL 10 MG PO TABS Oral Take 10 mg by mouth at bedtime.     Marland Kitchen LEVOTHYROXINE SODIUM 88 MCG PO TABS Oral Take 88 mcg by mouth daily.    Marland Kitchen LISINOPRIL 10 MG PO TABS Oral Take 10 mg by mouth 2 (two) times daily.    Marland Kitchen PANCRELIPASE (LIP-PROT-AMYL) 24000 UNITS PO CPEP Oral Take 1 capsule by mouth 3 (three) times daily with meals.    Marland Kitchen PANTOPRAZOLE SODIUM 40 MG PO TBEC Oral Take 40 mg by mouth daily.    Marland Kitchen PAROXETINE HCL 30 MG PO TABS Oral Take 30 mg by mouth every morning.    Marland Kitchen SIMVASTATIN 40 MG PO TABS Oral Take 40 mg by mouth  at bedtime.    . SULINDAC 200 MG PO TABS Oral Take 200 mg by mouth 2 (two) times daily with a meal.    . METOPROLOL SUCCINATE ER 25 MG PO TB24 Oral Take 2 tablets (50 mg total) by mouth daily. 14 tablet 0    BP 115/66  Pulse 80  Temp 97.9 F (36.6 C) (Oral)  Resp 13  SpO2 100%  Physical Exam  Nursing note and vitals reviewed. Constitutional: She is oriented to person, place, and time. She appears well-developed and well-nourished.  HENT:  Head: Normocephalic and atraumatic.  Eyes: EOM are normal. Pupils are equal, round, and reactive to light.  Neck: Normal range of motion. Neck supple.  Cardiovascular: Normal rate, regular rhythm and normal heart sounds.   No murmur heard. Pulmonary/Chest: Effort normal and breath sounds normal. No respiratory distress. She has no wheezes. She has no rales.       AICD to right upper chest wall.  Abdominal: Soft. Bowel sounds are normal. She exhibits no distension.  There is no tenderness. There is no rebound and no guarding.  Musculoskeletal: Normal range of motion.  Neurological: She is alert and oriented to person, place, and time. No cranial nerve deficit.  Skin: Skin is warm and dry.  Psychiatric: She has a normal mood and affect. Her speech is normal.    ED Course  Procedures (including critical care time)  Labs Reviewed  BASIC METABOLIC PANEL - Abnormal; Notable for the following:    Glucose, Bld 103 (*)     GFR calc non Af Amer 55 (*)     GFR calc Af Amer 64 (*)     All other components within normal limits  MAGNESIUM   Dg Chest 2 View  06/28/2012  *RADIOLOGY REPORT*  Clinical Data: AICD fired.  CHEST - 2 VIEW  Comparison: 09/29/2011.  Findings: Poor inspiration.  Mild increase in enlargement of the cardiac silhouette and increased prominence of the pulmonary vasculature, both magnified by the decreased inspiration.  No pleural fluid.  Stable right subclavian AICD lead.  Unremarkable bones.  IMPRESSION: Mildly progressive cardiomegaly with interval pulmonary vascular congestion.   Original Report Authenticated By: Gerald Stabs, M.D.      1. Atrial fibrillation with RVR     Date: 06/28/2012  Rate: 75  Rhythm: normal sinus rhythm  QRS Axis: normal  Intervals: normal  ST/T Wave abnormalities: nonspecific T wave changes  Conduction Disutrbances:none  Narrative Interpretation:   Old EKG Reviewed: unchanged     MDM  Patient's AICD fired. After interrogation showed that fired for in A. fib with a rate of 230. After discussion with Dr. Caryl Comes, patient was started on Toprol and will be discharged home to follow with her EP. She is in sinus rhythm here.        Jasper Riling. Alvino Chapel, MD 06/28/12 2028

## 2012-06-28 NOTE — ED Notes (Signed)
Per EMS, pt was grocery shopping and her ICD fired x 1. Pt denies any pain at that time or now. 20g to LAC, VSS. States felt like "something fell on my head"

## 2012-06-28 NOTE — ED Notes (Signed)
Pharmacy reports they are sending Metoprolol now

## 2012-07-03 ENCOUNTER — Ambulatory Visit (INDEPENDENT_AMBULATORY_CARE_PROVIDER_SITE_OTHER): Payer: Medicare Other | Admitting: Internal Medicine

## 2012-07-03 ENCOUNTER — Encounter: Payer: Self-pay | Admitting: Internal Medicine

## 2012-07-03 VITALS — BP 142/90 | HR 86 | Ht 64.0 in | Wt 172.0 lb

## 2012-07-03 DIAGNOSIS — Z95810 Presence of automatic (implantable) cardiac defibrillator: Secondary | ICD-10-CM

## 2012-07-03 DIAGNOSIS — I10 Essential (primary) hypertension: Secondary | ICD-10-CM | POA: Diagnosis not present

## 2012-07-03 DIAGNOSIS — I5022 Chronic systolic (congestive) heart failure: Secondary | ICD-10-CM

## 2012-07-03 MED ORDER — FUROSEMIDE 40 MG PO TABS: 40.0000 mg | ORAL_TABLET | Freq: Every day | ORAL | Status: DC

## 2012-07-03 MED ORDER — POTASSIUM CHLORIDE CRYS ER 20 MEQ PO TBCR: 20.0000 meq | EXTENDED_RELEASE_TABLET | Freq: Every day | ORAL | Status: DC

## 2012-07-03 NOTE — Patient Instructions (Signed)
Your physician recommends that you schedule a follow-up appointment in: 3-4 weeks with Dr Lovena Le   Your physician has requested that you have an echocardiogram. Echocardiography is a painless test that uses sound waves to create images of your heart. It provides your doctor with information about the size and shape of your heart and how well your heart's chambers and valves are working. This procedure takes approximately one hour. There are no restrictions for this procedure.---THIS WEEK   Your physician has recommended you make the following change in your medication: 1) Start Furosemide 40mg  daily 2) Potassium 56meq daily

## 2012-07-03 NOTE — Assessment & Plan Note (Signed)
Her symptoms are not well controlled. Today we started the patient on Lasix. I'll see her back in several weeks.

## 2012-07-03 NOTE — Assessment & Plan Note (Signed)
Her device is working normally but is been reprogrammed to help minimize shocks in atrial fibrillation. Additional antiarrhythmic drug therapy would be considered if these episodes recur.

## 2012-07-03 NOTE — Assessment & Plan Note (Signed)
Her blood pressure is mildly elevated. Hopefully Lasix therapy and adjustment of her medical therapy will help improve her blood pressure.

## 2012-07-03 NOTE — Progress Notes (Signed)
HPI [name] returns today for followup. She is a very pleasant 66 year old woman with a history of chronic systolic heart failure, status post ICD implantation, with recent hospitalization where she received an ICD shock secondary to atrial fibrillation with a very rapid ventricular response. She complains of fatigue weakness and shortness of breath. No chest pain. She has dyspnea with exertion. Allergies  Allergen Reactions  . Codeine Nausea And Vomiting  . Meperidine Hcl Nausea And Vomiting     Current Outpatient Prescriptions  Medication Sig Dispense Refill  . ARIPiprazole (ABILIFY) 5 MG tablet Take 5 mg by mouth daily.      Marland Kitchen aspirin 325 MG tablet Take 325 mg by mouth daily.      . Aspirin-Acetaminophen-Caffeine (GOODY HEADACHE PO) Take 1 packet by mouth as needed. For pain      . diazepam (VALIUM) 2 MG tablet Take 2 mg by mouth every 6 (six) hours as needed. For anxiety      . fish oil-omega-3 fatty acids 1000 MG capsule Take 1 g by mouth daily.      . hydrOXYzine (ATARAX/VISTARIL) 10 MG tablet Take 10 mg by mouth at bedtime.       Marland Kitchen levothyroxine (SYNTHROID, LEVOTHROID) 88 MCG tablet Take 88 mcg by mouth daily.      Marland Kitchen lisinopril (PRINIVIL,ZESTRIL) 10 MG tablet Take 10 mg by mouth 2 (two) times daily.      . metoprolol succinate (TOPROL-XL) 25 MG 24 hr tablet Take 2 tablets (50 mg total) by mouth daily.  14 tablet  0  . Pancrelipase, Lip-Prot-Amyl, 24000 UNITS CPEP Take 1 capsule by mouth 3 (three) times daily with meals.      . pantoprazole (PROTONIX) 40 MG tablet Take 40 mg by mouth daily.      Marland Kitchen PARoxetine (PAXIL) 30 MG tablet Take 30 mg by mouth every morning.      . simvastatin (ZOCOR) 40 MG tablet Take 40 mg by mouth at bedtime.      . sulindac (CLINORIL) 200 MG tablet Take 200 mg by mouth 2 (two) times daily with a meal.      . furosemide (LASIX) 40 MG tablet Take 1 tablet (40 mg total) by mouth daily.  90 tablet  3  . potassium chloride SA (KLOR-CON M20) 20 MEQ tablet Take  1 tablet (20 mEq total) by mouth daily.  90 tablet  3     Past Medical History  Diagnosis Date  . Arthritis   . Hypertension   . A-fib   . High cholesterol     ROS:   All systems reviewed and negative except as noted in the HPI.   Past Surgical History  Procedure Date  . Cardiac defibrillator placement   . Abdominal hysterectomy   . Hernia repair as child    x 2     No family history on file.   History   Social History  . Marital Status: Widowed    Spouse Name: N/A    Number of Children: N/A  . Years of Education: N/A   Occupational History  . Not on file.   Social History Main Topics  . Smoking status: Never Smoker   . Smokeless tobacco: Never Used  . Alcohol Use: No  . Drug Use: No  . Sexually Active: Not on file   Other Topics Concern  . Not on file   Social History Narrative  . No narrative on file     BP 142/90  Pulse 86  Ht 5\' 4"  (1.626 m)  Wt 172 lb (78.019 kg)  BMI 29.52 kg/m2  SpO2 92%  Physical Exam:  ill appearing 65 year old woman, who looks older than her stated age, but in NAD HEENT: Unremarkable Neck:  7 cm JVD, no thyromegally Lungs:  Clear except for rales in the bases bilaterally. O2 saturation 90% on room air at rest HEART:  Regular rate rhythm, no murmurs, no rubs, no clicks Abd:  soft, positive bowel sounds, no organomegally, no rebound, no guarding Ext:  2 plus pulses, no edema, no cyanosis, no clubbing Skin:  No rashes no nodules Neuro:  CN II through XII intact, motor grossly intact  DEVICE  Normal device function.  See PaceArt for details.   Assess/Plan:

## 2012-07-04 ENCOUNTER — Other Ambulatory Visit (HOSPITAL_COMMUNITY): Payer: Self-pay | Admitting: Internal Medicine

## 2012-07-04 DIAGNOSIS — R0600 Dyspnea, unspecified: Secondary | ICD-10-CM

## 2012-07-05 ENCOUNTER — Ambulatory Visit (HOSPITAL_COMMUNITY): Payer: Medicare Other | Attending: Cardiology | Admitting: Radiology

## 2012-07-05 DIAGNOSIS — I5020 Unspecified systolic (congestive) heart failure: Secondary | ICD-10-CM | POA: Insufficient documentation

## 2012-07-05 DIAGNOSIS — R0609 Other forms of dyspnea: Secondary | ICD-10-CM | POA: Insufficient documentation

## 2012-07-05 DIAGNOSIS — R0600 Dyspnea, unspecified: Secondary | ICD-10-CM

## 2012-07-05 DIAGNOSIS — I059 Rheumatic mitral valve disease, unspecified: Secondary | ICD-10-CM | POA: Insufficient documentation

## 2012-07-05 DIAGNOSIS — I10 Essential (primary) hypertension: Secondary | ICD-10-CM | POA: Insufficient documentation

## 2012-07-05 DIAGNOSIS — I428 Other cardiomyopathies: Secondary | ICD-10-CM | POA: Diagnosis not present

## 2012-07-05 DIAGNOSIS — I369 Nonrheumatic tricuspid valve disorder, unspecified: Secondary | ICD-10-CM | POA: Diagnosis not present

## 2012-07-05 DIAGNOSIS — R0989 Other specified symptoms and signs involving the circulatory and respiratory systems: Secondary | ICD-10-CM | POA: Diagnosis not present

## 2012-07-05 DIAGNOSIS — I2510 Atherosclerotic heart disease of native coronary artery without angina pectoris: Secondary | ICD-10-CM | POA: Diagnosis not present

## 2012-07-05 NOTE — Progress Notes (Signed)
Echocardiogram performed.  

## 2012-07-06 ENCOUNTER — Telehealth: Payer: Self-pay | Admitting: Internal Medicine

## 2012-07-06 NOTE — Telephone Encounter (Signed)
Walk in pt From " Pt Needs conformation he needs to still be taking Medication" Sent to [facility] 07/06/12/KM

## 2012-07-09 DIAGNOSIS — I498 Other specified cardiac arrhythmias: Secondary | ICD-10-CM | POA: Diagnosis not present

## 2012-07-09 DIAGNOSIS — I5020 Unspecified systolic (congestive) heart failure: Secondary | ICD-10-CM | POA: Diagnosis not present

## 2012-07-09 DIAGNOSIS — I10 Essential (primary) hypertension: Secondary | ICD-10-CM | POA: Diagnosis not present

## 2012-07-09 DIAGNOSIS — R002 Palpitations: Secondary | ICD-10-CM | POA: Diagnosis not present

## 2012-07-09 DIAGNOSIS — E7800 Pure hypercholesterolemia, unspecified: Secondary | ICD-10-CM | POA: Diagnosis not present

## 2012-07-13 ENCOUNTER — Telehealth: Payer: Self-pay | Admitting: Internal Medicine

## 2012-07-20 ENCOUNTER — Encounter: Payer: Self-pay | Admitting: *Deleted

## 2012-07-26 ENCOUNTER — Telehealth: Payer: Self-pay | Admitting: Internal Medicine

## 2012-07-26 NOTE — Telephone Encounter (Signed)
Pt advised, verbalized understanding. Pt has rescheduled her appt with Dr Lovena Le from 07/27/12 to 08/15/12.

## 2012-07-26 NOTE — Telephone Encounter (Signed)
Pt would like results of echo

## 2012-07-27 ENCOUNTER — Encounter: Payer: Medicare Other | Admitting: Internal Medicine

## 2012-08-15 ENCOUNTER — Encounter: Payer: Medicare Other | Admitting: Internal Medicine

## 2012-08-22 ENCOUNTER — Encounter: Payer: Medicare Other | Admitting: Internal Medicine

## 2012-08-26 ENCOUNTER — Emergency Department (HOSPITAL_COMMUNITY): Payer: Medicare Other

## 2012-08-26 ENCOUNTER — Encounter (HOSPITAL_COMMUNITY): Payer: Self-pay | Admitting: Emergency Medicine

## 2012-08-26 ENCOUNTER — Emergency Department (HOSPITAL_COMMUNITY)
Admission: EM | Admit: 2012-08-26 | Discharge: 2012-08-28 | Disposition: A | Discharge status: Other Acute Inpatient Hospital | Payer: Medicare Other | Attending: Emergency Medicine | Admitting: Emergency Medicine

## 2012-08-26 DIAGNOSIS — K2990 Gastroduodenitis, unspecified, without bleeding: Secondary | ICD-10-CM | POA: Diagnosis not present

## 2012-08-26 DIAGNOSIS — F3289 Other specified depressive episodes: Secondary | ICD-10-CM | POA: Diagnosis not present

## 2012-08-26 DIAGNOSIS — R45851 Suicidal ideations: Secondary | ICD-10-CM | POA: Diagnosis not present

## 2012-08-26 DIAGNOSIS — I4891 Unspecified atrial fibrillation: Secondary | ICD-10-CM | POA: Diagnosis not present

## 2012-08-26 DIAGNOSIS — I10 Essential (primary) hypertension: Secondary | ICD-10-CM | POA: Diagnosis not present

## 2012-08-26 DIAGNOSIS — R197 Diarrhea, unspecified: Secondary | ICD-10-CM | POA: Diagnosis not present

## 2012-08-26 DIAGNOSIS — Z79899 Other long term (current) drug therapy: Secondary | ICD-10-CM | POA: Diagnosis not present

## 2012-08-26 DIAGNOSIS — E7800 Pure hypercholesterolemia, unspecified: Secondary | ICD-10-CM | POA: Insufficient documentation

## 2012-08-26 DIAGNOSIS — R110 Nausea: Secondary | ICD-10-CM | POA: Diagnosis not present

## 2012-08-26 DIAGNOSIS — Z7982 Long term (current) use of aspirin: Secondary | ICD-10-CM | POA: Insufficient documentation

## 2012-08-26 DIAGNOSIS — F329 Major depressive disorder, single episode, unspecified: Secondary | ICD-10-CM | POA: Insufficient documentation

## 2012-08-26 DIAGNOSIS — M129 Arthropathy, unspecified: Secondary | ICD-10-CM | POA: Diagnosis not present

## 2012-08-26 DIAGNOSIS — R109 Unspecified abdominal pain: Secondary | ICD-10-CM | POA: Diagnosis not present

## 2012-08-26 LAB — CBC WITH DIFFERENTIAL/PLATELET
Basophils Absolute: 0 10*3/uL (ref 0.0–0.1)
Basophils Relative: 0 % (ref 0–1)
HCT: 30.5 % — ABNORMAL LOW (ref 36.0–46.0)
Hemoglobin: 10.7 g/dL — ABNORMAL LOW (ref 12.0–15.0)
Lymphocytes Relative: 20 % (ref 12–46)
Monocytes Absolute: 0.5 10*3/uL (ref 0.1–1.0)
Neutro Abs: 4.1 10*3/uL (ref 1.7–7.7)
Neutrophils Relative %: 71 % (ref 43–77)
RDW: 12.3 % (ref 11.5–15.5)
WBC: 5.8 10*3/uL (ref 4.0–10.5)

## 2012-08-26 LAB — COMPREHENSIVE METABOLIC PANEL
ALT: 10 U/L (ref 0–35)
AST: 25 U/L (ref 0–37)
Albumin: 4.6 g/dL (ref 3.5–5.2)
Alkaline Phosphatase: 65 U/L (ref 39–117)
CO2: 22 mEq/L (ref 19–32)
Chloride: 95 mEq/L — ABNORMAL LOW (ref 96–112)
Creatinine, Ser: 1.72 mg/dL — ABNORMAL HIGH (ref 0.50–1.10)
GFR calc non Af Amer: 30 mL/min — ABNORMAL LOW (ref >90)
Potassium: 3.9 mEq/L (ref 3.5–5.1)
Total Bilirubin: 0.4 mg/dL (ref 0.3–1.2)

## 2012-08-26 LAB — RAPID URINE DRUG SCREEN, HOSP PERFORMED
Amphetamines: NOT DETECTED
Benzodiazepines: POSITIVE — AB
Cocaine: NOT DETECTED
Opiates: NOT DETECTED

## 2012-08-26 LAB — ETHANOL: Alcohol, Ethyl (B): <11 mg/dL (ref 0–11)

## 2012-08-26 LAB — LIPASE, BLOOD: Lipase: 56 U/L (ref 11–59)

## 2012-08-26 LAB — URINALYSIS, ROUTINE W REFLEX MICROSCOPIC
Glucose, UA: NEGATIVE mg/dL
Hgb urine dipstick: NEGATIVE
Ketones, ur: 15 mg/dL — AB
Protein, ur: NEGATIVE mg/dL
Urobilinogen, UA: 0.2 mg/dL (ref 0.0–1.0)

## 2012-08-26 LAB — URINE MICROSCOPIC-ADD ON

## 2012-08-26 MED ORDER — LEVOTHYROXINE SODIUM 88 MCG PO TABS
88.0000 ug | ORAL_TABLET | Freq: Every day | ORAL | Status: DC
  Administered 2012-08-26 – 2012-08-28 (×3): 88 ug via ORAL
  Filled 2012-08-26 (×3): qty 1

## 2012-08-26 MED ORDER — METOPROLOL SUCCINATE ER 50 MG PO TB24
50.0000 mg | ORAL_TABLET | Freq: Every day | ORAL | Status: DC
  Administered 2012-08-27 – 2012-08-28 (×2): 50 mg via ORAL
  Filled 2012-08-26 (×2): qty 1

## 2012-08-26 MED ORDER — METOPROLOL SUCCINATE ER 50 MG PO TB24
50.0000 mg | ORAL_TABLET | Freq: Every day | ORAL | Status: DC
  Filled 2012-08-26 (×2): qty 1

## 2012-08-26 MED ORDER — FENTANYL CITRATE 0.05 MG/ML IJ SOLN
50.0000 ug | Freq: Once | INTRAMUSCULAR | Status: AC
  Administered 2012-08-26: 50 ug via INTRAVENOUS
  Filled 2012-08-26: qty 2

## 2012-08-26 MED ORDER — ACETAMINOPHEN 325 MG PO TABS
650.0000 mg | ORAL_TABLET | Freq: Four times a day (QID) | ORAL | Status: DC | PRN
  Administered 2012-08-27 – 2012-08-28 (×3): 650 mg via ORAL
  Filled 2012-08-26 (×3): qty 2

## 2012-08-26 MED ORDER — SODIUM CHLORIDE 0.9 % IV BOLUS (SEPSIS)
1000.0000 mL | Freq: Once | INTRAVENOUS | Status: AC
  Administered 2012-08-26: 1000 mL via INTRAVENOUS

## 2012-08-26 MED ORDER — HYDROXYZINE HCL 10 MG PO TABS
10.0000 mg | ORAL_TABLET | Freq: Every day | ORAL | Status: DC
  Administered 2012-08-26 – 2012-08-27 (×2): 10 mg via ORAL
  Filled 2012-08-26 (×4): qty 1

## 2012-08-26 MED ORDER — PAROXETINE HCL 30 MG PO TABS
60.0000 mg | ORAL_TABLET | Freq: Every day | ORAL | Status: DC
  Administered 2012-08-26 – 2012-08-28 (×3): 60 mg via ORAL
  Filled 2012-08-26 (×3): qty 2

## 2012-08-26 MED ORDER — ARIPIPRAZOLE 5 MG PO TABS
5.0000 mg | ORAL_TABLET | Freq: Every day | ORAL | Status: DC
  Administered 2012-08-26 – 2012-08-28 (×3): 5 mg via ORAL
  Filled 2012-08-26 (×3): qty 1

## 2012-08-26 MED ORDER — SULINDAC 200 MG PO TABS
200.0000 mg | ORAL_TABLET | Freq: Two times a day (BID) | ORAL | Status: DC
  Administered 2012-08-27 – 2012-08-28 (×4): 200 mg via ORAL
  Filled 2012-08-26 (×6): qty 1

## 2012-08-26 MED ORDER — FUROSEMIDE 20 MG PO TABS
40.0000 mg | ORAL_TABLET | Freq: Every day | ORAL | Status: DC
  Administered 2012-08-26 – 2012-08-28 (×3): 40 mg via ORAL
  Filled 2012-08-26 (×3): qty 2

## 2012-08-26 MED ORDER — ASPIRIN EC 325 MG PO TBEC
325.0000 mg | DELAYED_RELEASE_TABLET | ORAL | Status: DC
  Administered 2012-08-26 – 2012-08-28 (×2): 325 mg via ORAL
  Filled 2012-08-26 (×2): qty 1

## 2012-08-26 MED ORDER — HYDROMORPHONE HCL PF 1 MG/ML IJ SOLN: 1.0000 mg | Freq: Once | INTRAMUSCULAR | Status: DC

## 2012-08-26 MED ORDER — ONDANSETRON HCL 4 MG/2ML IJ SOLN
4.0000 mg | Freq: Once | INTRAMUSCULAR | Status: AC
  Administered 2012-08-26: 4 mg via INTRAVENOUS
  Filled 2012-08-26: qty 2

## 2012-08-26 MED ORDER — LISINOPRIL 10 MG PO TABS
10.0000 mg | ORAL_TABLET | Freq: Two times a day (BID) | ORAL | Status: DC
  Administered 2012-08-27 – 2012-08-28 (×3): 10 mg via ORAL
  Filled 2012-08-26 (×5): qty 1

## 2012-08-26 MED ORDER — PANTOPRAZOLE SODIUM 40 MG PO TBEC
40.0000 mg | DELAYED_RELEASE_TABLET | Freq: Every day | ORAL | Status: DC
Start: 2012-08-26 — End: 2012-08-28
  Administered 2012-08-26 – 2012-08-28 (×3): 40 mg via ORAL
  Filled 2012-08-26 (×3): qty 1

## 2012-08-26 MED ORDER — DIAZEPAM 2 MG PO TABS
2.0000 mg | ORAL_TABLET | Freq: Three times a day (TID) | ORAL | Status: DC
  Administered 2012-08-27 – 2012-08-28 (×5): 2 mg via ORAL
  Filled 2012-08-26 (×6): qty 1

## 2012-08-26 MED ORDER — DICYCLOMINE HCL 10 MG PO CAPS
10.0000 mg | ORAL_CAPSULE | Freq: Once | ORAL | Status: AC
  Administered 2012-08-26: 10 mg via ORAL
  Filled 2012-08-26: qty 1

## 2012-08-26 NOTE — ED Notes (Signed)
Pt given paper scrubs to change into and security has arrived to wand pt.

## 2012-08-26 NOTE — ED Notes (Signed)
Patient transported to X-ray 

## 2012-08-26 NOTE — ED Provider Notes (Signed)
History     CSN: [id number]  Arrival date & time 08/26/12  1650   First MD Initiated Contact with Patient 08/26/12 1653      Chief Complaint  Patient presents with  . Diarrhea    (Consider location/radiation/quality/duration/timing/severity/associated sxs/prior treatment) HPI Pt with history of arthritis and depression reports about 2 weeks of diarrhea, nausea, dry heaves and intermittent abdominal cramping since she ran out of her antidepressants. She Has had frequent back and leg spasms over the last two days as well. Last vomiting and diarrhea was earlier today. She denies fever, no blood in stool, no recent admissions or antibiotics.   Past Medical History  Diagnosis Date  . Arthritis   . Hypertension   . A-fib   . High cholesterol     Past Surgical History  Procedure Date  . Cardiac defibrillator placement   . Abdominal hysterectomy   . Hernia repair as child    x 2    No family history on file.  History  Substance Use Topics  . Smoking status: Never Smoker   . Smokeless tobacco: Never Used  . Alcohol Use: No    OB History    Grav Para Term Preterm Abortions TAB SAB Ect Mult Living                  Review of Systems All other systems reviewed and are negative except as noted in HPI.   Allergies  Codeine and Meperidine hcl  Home Medications   Current Outpatient Rx  Name  Route  Sig  Dispense  Refill  . ARIPIPRAZOLE 5 MG PO TABS   Oral   Take 5 mg by mouth daily.         . ASPIRIN 325 MG PO TABS   Oral   Take 325 mg by mouth daily.         Gabriel Earing HEADACHE PO   Oral   Take 1 packet by mouth as needed. For pain         . DIAZEPAM 2 MG PO TABS   Oral   Take 2 mg by mouth every 6 (six) hours as needed. For anxiety         . OMEGA-3 FATTY ACIDS 1000 MG PO CAPS   Oral   Take 1 g by mouth daily.         . FUROSEMIDE 40 MG PO TABS   Oral   Take 1 tablet (40 mg total) by mouth daily.   90 tablet   3   . HYDROXYZINE HCL 10 MG  PO TABS   Oral   Take 10 mg by mouth at bedtime.          Marland Kitchen LEVOTHYROXINE SODIUM 88 MCG PO TABS   Oral   Take 88 mcg by mouth daily.         Marland Kitchen LISINOPRIL 10 MG PO TABS   Oral   Take 10 mg by mouth 2 (two) times daily.         Marland Kitchen METOPROLOL SUCCINATE ER 25 MG PO TB24   Oral   Take 2 tablets (50 mg total) by mouth daily.   14 tablet   0   . PANCRELIPASE (LIP-PROT-AMYL) 24000 UNITS PO CPEP   Oral   Take 1 capsule by mouth 3 (three) times daily with meals.         Marland Kitchen PANTOPRAZOLE SODIUM 40 MG PO TBEC   Oral   Take 40 mg by mouth daily.         Marland Kitchen  PAROXETINE HCL 30 MG PO TABS   Oral   Take 30 mg by mouth every morning.         Marland Kitchen POTASSIUM CHLORIDE CRYS ER 20 MEQ PO TBCR   Oral   Take 1 tablet (20 mEq total) by mouth daily.   90 tablet   3   . SIMVASTATIN 40 MG PO TABS   Oral   Take 40 mg by mouth at bedtime.         . SULINDAC 200 MG PO TABS   Oral   Take 200 mg by mouth 2 (two) times daily with a meal.           BP 115/58  Pulse 68  Temp 98.6 F (37 C)  Resp 18  SpO2 98%  Physical Exam  Nursing note and vitals reviewed. Constitutional: She is oriented to person, place, and time. She appears well-developed and well-nourished.  HENT:  Head: Normocephalic and atraumatic.  Eyes: EOM are normal. Pupils are equal, round, and reactive to light.  Neck: Normal range of motion. Neck supple.  Cardiovascular: Normal rate and intact distal pulses.   Murmur heard. Pulmonary/Chest: Effort normal and breath sounds normal.  Abdominal: Bowel sounds are normal. She exhibits no distension. There is no tenderness.  Musculoskeletal: Normal range of motion. She exhibits no edema and no tenderness.  Neurological: She is alert and oriented to person, place, and time. She has normal strength. No cranial nerve deficit or sensory deficit.  Skin: Skin is warm and dry. No rash noted.  Psychiatric: She has a normal mood and affect.    ED Course  Procedures (including  critical care time)  Labs Reviewed  CBC WITH DIFFERENTIAL - Abnormal; Notable for the following:    RBC 3.41 (*)     Hemoglobin 10.7 (*)     HCT 30.5 (*)     All other components within normal limits  COMPREHENSIVE METABOLIC PANEL - Abnormal; Notable for the following:    Sodium 131 (*)     Chloride 95 (*)     BUN 36 (*)     Creatinine, Ser 1.72 (*)     GFR calc non Af Amer 30 (*)     GFR calc Af Amer 35 (*)     All other components within normal limits  URINALYSIS, ROUTINE W REFLEX MICROSCOPIC - Abnormal; Notable for the following:    APPearance CLOUDY (*)     Bilirubin Urine SMALL (*)     Ketones, ur 15 (*)     Leukocytes, UA MODERATE (*)     All other components within normal limits  LIPASE, BLOOD  URINE MICROSCOPIC-ADD ON   Dg Abd Acute W/chest  08/26/2012  *RADIOLOGY REPORT*  Clinical Data: Abdominal pain, vomiting  ACUTE ABDOMEN SERIES (ABDOMEN 2 VIEW & CHEST 1 VIEW)  Comparison: 06/28/2012  Findings: Cardiomegaly again noted.  A single lead cardiac pacemaker is unchanged in position.  No acute infiltrate or pulmonary edema.  There is nonspecific nonobstructive bowel gas pattern.  No free abdominal air.  Distal colonic diverticula are noted.  IMPRESSION: No acute disease.  Nonspecific nonobstructive bowel gas pattern. No free abdominal air.   Original Report Authenticated By: Lahoma Crocker, M.D.      No diagnosis found.    MDM  Pt has not had any vomiting or diarrhea in the ED. Now states she is suicidal because she doesn't feel well. She does not feel safe at home. Will move to Pod-C to hold for  ACT eval. Restart antidepressant. Add additional screening labs.        Charles B. Karle Starch, MD 08/28/12 [phone]

## 2012-08-26 NOTE — ED Notes (Signed)
Pt. From home via EMS. Pt states she has had Diarrhea for the last week with stomach cramping.

## 2012-08-26 NOTE — ED Notes (Signed)
[name] (Son) [phone]

## 2012-08-26 NOTE — BH Assessment (Signed)
Assessment Note   [name] is an 66 y.o. female. Pt reports she is depressed and has been off her paxil due to a problem getting the prescription for 2-3 weeks.  Pt states she has been "going downhill ever since issues came up with my heart."  Pt reports she began to have suicidal thoughts last night and seriously considered hanging herself.  Pt quite tearful while relaying this information.  Pt states she is having financial problems as well and still deals with the death of her husband in 01-28-2008.  Pt reports she continues to have SI currently.  Denies HI/AV.  No prior psych or CD treatment.  Pt denies any alcohol or drug use.  Axis I: Major Depression, single episode Axis II: Deferred Axis III:  Past Medical History  Diagnosis Date  . Arthritis   . Hypertension   . A-fib   . High cholesterol    Axis IV: economic problems and medical issues Axis V: 31-40 impairment in reality testing  Past Medical History:  Past Medical History  Diagnosis Date  . Arthritis   . Hypertension   . A-fib   . High cholesterol     Past Surgical History  Procedure Date  . Cardiac defibrillator placement   . Abdominal hysterectomy   . Hernia repair as child    x 2    Family History: No family history on file.  Social History:  reports that she has never smoked. She has never used smokeless tobacco. She reports that she does not drink alcohol or use illicit drugs.  Additional Social History:  Alcohol / Drug Use Pain Medications: Pt denies alcohol or drug use. History of alcohol / drug use?: No history of alcohol / drug abuse  CIWA: CIWA-Ar BP: 128/49 mmHg Pulse Rate: 75  COWS:    Allergies:  Allergies  Allergen Reactions  . Codeine Nausea And Vomiting  . Meperidine Hcl Nausea And Vomiting    Home Medications:  (Not in a hospital admission)  OB/GYN Status:  No LMP recorded. Patient has had a hysterectomy.  General Assessment Data Location of Assessment: [facility] ED ACT Assessment:  Yes Living Arrangements: Alone Can pt return to current living arrangement?: Yes Admission Status: Voluntary Is patient capable of signing voluntary admission?: Yes Transfer from: [facility]  Education Status Is patient currently in school?: No  Risk to self Suicidal Ideation: Yes-Currently Present Suicidal Intent: No Is patient at risk for suicide?: Yes Suicidal Plan?: Yes-Currently Present Specify Current Suicidal Plan: hang self Access to Means: Yes Specify Access to Suicidal Means: phone cord What has been your use of drugs/alcohol within the last 12 months?: pt denies use Previous Attempts/Gestures: No Intentional Self Injurious Behavior: None Family Suicide History: No Recent stressful life event(s): Financial Problems;Other (Comment) (medical issues with heart) Persecutory voices/beliefs?: No Depression: Yes Depression Symptoms: Despondent;Insomnia;Tearfulness;Isolating;Fatigue;Loss of interest in usual pleasures;Feeling worthless/self pity;Feeling angry/irritable Substance abuse history and/or treatment for substance abuse?: No Suicide prevention information given to non-admitted patients: Not applicable  Risk to Others Homicidal Ideation: No Thoughts of Harm to Others: No Current Homicidal Intent: No Current Homicidal Plan: No Access to Homicidal Means: No History of harm to others?: No Assessment of Violence: None Noted Does patient have access to weapons?: No Criminal Charges Pending?: No Does patient have a court date: No  Psychosis Hallucinations: None noted Delusions: None noted  Mental Status Report Appear/Hygiene: Other (Comment);Disheveled (casual) Eye Contact: Fair Motor Activity: Unremarkable Speech: Logical/coherent Level of Consciousness: Alert Mood: Depressed Affect:  Appropriate to circumstance;Frightened Anxiety Level: None Thought Processes: Coherent;Relevant Judgement: Unimpaired Orientation: Person;Place;Time;Situation Obsessive  Compulsive Thoughts/Behaviors: None  Cognitive Functioning Concentration: Normal Memory: Recent Intact;Remote Intact IQ: Average Insight: Good Impulse Control: Fair Appetite: Poor Weight Loss:  (unsure) Weight Gain: 0  Sleep: Decreased Total Hours of Sleep:  (unsure how much-up and down all night) Vegetative Symptoms: None  ADLScreening [facility] Assessment Services) Patient's cognitive ability adequate to safely complete daily activities?: Yes Patient able to express need for assistance with ADLs?: Yes Independently performs ADLs?: Yes (appropriate for developmental age)  Abuse/Neglect [facility]) Physical Abuse: Denies Verbal Abuse: Denies Sexual Abuse: Denies  Prior Inpatient Therapy Prior Inpatient Therapy: No  Prior Outpatient Therapy Prior Outpatient Therapy: No  ADL Screening (condition at time of admission) Patient's cognitive ability adequate to safely complete daily activities?: Yes Patient able to express need for assistance with ADLs?: Yes Independently performs ADLs?: Yes (appropriate for developmental age) Weakness of Legs: None Weakness of Arms/Hands: None  Home Assistive Devices/Equipment Home Assistive Devices/Equipment: None    Abuse/Neglect Assessment (Assessment to be complete while patient is alone) Physical Abuse: Denies Verbal Abuse: Denies Sexual Abuse: Denies Exploitation of patient/patient's resources: Denies Self-Neglect: Denies Values / Beliefs Cultural Requests During Hospitalization: None Spiritual Requests During Hospitalization: None   Advance Directives (For Healthcare) Advance Directive: Patient does not have advance directive;Patient would not like information    Additional Information 1:1 In Past 12 Months?: No CIRT Risk: No Elopement Risk: No Does patient have medical clearance?: Yes     Disposition: Pt will be referred for inpt treatment. Disposition Disposition of Patient: Inpatient treatment program Type of inpatient  treatment program: Adult  On Site Evaluation by:   Reviewed with Physician:     Joanne Chars 08/26/2012 10:12 PM

## 2012-08-27 LAB — BASIC METABOLIC PANEL
Calcium: 9.2 mg/dL (ref 8.4–10.5)
Creatinine, Ser: 1.64 mg/dL — ABNORMAL HIGH (ref 0.50–1.10)
GFR calc Af Amer: 37 mL/min — ABNORMAL LOW (ref >90)

## 2012-08-27 MED ORDER — DICYCLOMINE HCL 10 MG PO CAPS
10.0000 mg | ORAL_CAPSULE | Freq: Three times a day (TID) | ORAL | Status: DC | PRN
  Administered 2012-08-27: 10 mg via ORAL
  Filled 2012-08-27: qty 1

## 2012-08-27 MED ORDER — SODIUM CHLORIDE 0.9 % IV SOLN
INTRAVENOUS | Status: DC
  Administered 2012-08-27 – 2012-08-28 (×2): via INTRAVENOUS

## 2012-08-27 MED ORDER — SODIUM CHLORIDE 0.9 % IV BOLUS (SEPSIS)
250.0000 mL | Freq: Once | INTRAVENOUS | Status: AC
  Administered 2012-08-27: 250 mL via INTRAVENOUS

## 2012-08-27 NOTE — ED Notes (Signed)
Patient resting while watching tv and talking with sitter with NAD. Sitter at bedside.

## 2012-08-27 NOTE — ED Provider Notes (Signed)
Patient is awaiting approval from [place]. [facility] requested chest x-ray and EKG. They have been ordered. Patient's BUN and creatinine is a little elevated her sodium and chloride a little on the low side. Patient is on Lasix is probably contributory to this. We'll give patient some normal saline IV to help hydrate her. No history of congestive heart failure.  Mervin Kung, MD 08/27/12 1336

## 2012-08-27 NOTE — BH Assessment (Signed)
[facility] Assessment Progress Note      Update:  [facility] declined pt due to her acuity and recommended geropsych.  Called [facility] and per [facility], beds available @ [phone].  Received call back from April asking if pt could be made IVC for transport as well as if another BMP could be drawn due to low levels of sodium and chloride as well as pt's BUN and creatinine levels elevated.  [facility] EDP Zackowski.  Writer followed up and EDP Wentz ordered it to be redrawn.  Horton Chin at [facility] @ [phone] and she will inform her that IVC papers to be faxed as well as new labs once received.  Oncoming staff to follow up and fax for review.

## 2012-08-27 NOTE — ED Notes (Signed)
Pt complaint of abdominal cramping and diarrhea. Pt ambulated to bathroom and provided with soap and wash cloth to clean up

## 2012-08-27 NOTE — ED Notes (Signed)
Patient sitting up in chair eating dinner with NAD. Sitter at bedside.

## 2012-08-27 NOTE — ED Notes (Signed)
Patient sleeping with NAD. Sitter at bedside.

## 2012-08-27 NOTE — ED Notes (Signed)
Patient sleeping and was easily aroused with NAD. Sitter at bedside.

## 2012-08-27 NOTE — ED Notes (Signed)
Patient resting with NAD. Sitter at bedside and has assisted patient to restroom.

## 2012-08-27 NOTE — ED Notes (Signed)
Patient resting with NAD. Sitter at bedside.

## 2012-08-27 NOTE — ED Notes (Signed)
First meeting with patient. Patient up with assistance to restroom. IV fluids running. Sitter at bedside.

## 2012-08-27 NOTE — BHH Counselor (Signed)
Patient has been declined at [facility] per Nena Polio PA; patient is too medically acute needs geropsychiatry.

## 2012-08-28 DIAGNOSIS — Z9079 Acquired absence of other genital organ(s): Secondary | ICD-10-CM | POA: Diagnosis not present

## 2012-08-28 DIAGNOSIS — G8929 Other chronic pain: Secondary | ICD-10-CM | POA: Diagnosis present

## 2012-08-28 DIAGNOSIS — M1990 Unspecified osteoarthritis, unspecified site: Secondary | ICD-10-CM | POA: Diagnosis present

## 2012-08-28 DIAGNOSIS — Z79899 Other long term (current) drug therapy: Secondary | ICD-10-CM | POA: Diagnosis not present

## 2012-08-28 DIAGNOSIS — Z95810 Presence of automatic (implantable) cardiac defibrillator: Secondary | ICD-10-CM | POA: Diagnosis not present

## 2012-08-28 DIAGNOSIS — F332 Major depressive disorder, recurrent severe without psychotic features: Secondary | ICD-10-CM | POA: Diagnosis not present

## 2012-08-28 DIAGNOSIS — E039 Hypothyroidism, unspecified: Secondary | ICD-10-CM | POA: Diagnosis present

## 2012-08-28 DIAGNOSIS — Z885 Allergy status to narcotic agent status: Secondary | ICD-10-CM | POA: Diagnosis not present

## 2012-08-28 DIAGNOSIS — E785 Hyperlipidemia, unspecified: Secondary | ICD-10-CM | POA: Diagnosis present

## 2012-08-28 DIAGNOSIS — F322 Major depressive disorder, single episode, severe without psychotic features: Secondary | ICD-10-CM | POA: Diagnosis present

## 2012-08-28 DIAGNOSIS — Z9889 Other specified postprocedural states: Secondary | ICD-10-CM | POA: Diagnosis not present

## 2012-08-28 DIAGNOSIS — F329 Major depressive disorder, single episode, unspecified: Secondary | ICD-10-CM | POA: Diagnosis not present

## 2012-08-28 DIAGNOSIS — I4891 Unspecified atrial fibrillation: Secondary | ICD-10-CM | POA: Diagnosis present

## 2012-08-28 DIAGNOSIS — M549 Dorsalgia, unspecified: Secondary | ICD-10-CM | POA: Diagnosis present

## 2012-08-28 DIAGNOSIS — M4120 Other idiopathic scoliosis, site unspecified: Secondary | ICD-10-CM | POA: Diagnosis present

## 2012-08-28 DIAGNOSIS — K589 Irritable bowel syndrome without diarrhea: Secondary | ICD-10-CM | POA: Diagnosis present

## 2012-08-28 DIAGNOSIS — D649 Anemia, unspecified: Secondary | ICD-10-CM | POA: Diagnosis present

## 2012-08-28 DIAGNOSIS — Z7982 Long term (current) use of aspirin: Secondary | ICD-10-CM | POA: Diagnosis not present

## 2012-08-28 DIAGNOSIS — E7800 Pure hypercholesterolemia, unspecified: Secondary | ICD-10-CM | POA: Diagnosis present

## 2012-08-28 DIAGNOSIS — E860 Dehydration: Secondary | ICD-10-CM | POA: Diagnosis present

## 2012-08-28 DIAGNOSIS — N179 Acute kidney failure, unspecified: Secondary | ICD-10-CM | POA: Diagnosis present

## 2012-08-28 DIAGNOSIS — R45851 Suicidal ideations: Secondary | ICD-10-CM | POA: Diagnosis not present

## 2012-08-28 DIAGNOSIS — R011 Cardiac murmur, unspecified: Secondary | ICD-10-CM | POA: Diagnosis present

## 2012-08-28 DIAGNOSIS — I10 Essential (primary) hypertension: Secondary | ICD-10-CM | POA: Diagnosis present

## 2012-08-28 DIAGNOSIS — Z23 Encounter for immunization: Secondary | ICD-10-CM | POA: Diagnosis not present

## 2012-08-28 NOTE — ED Notes (Signed)
This RN spoke with April at behavorial health. She is requesting repeat labs and rule out c. Diff if she can continues to have diarrhea if possible before pt can come to facility.

## 2012-08-28 NOTE — ED Notes (Signed)
Patient resting with NAD. Sitter at bedside.

## 2012-08-28 NOTE — ED Notes (Signed)
Waiting on return call from sheriff to transport pt to [facility].

## 2012-08-28 NOTE — ED Notes (Signed)
Updated on meds given & medication scheduled/ ordered per pt request.

## 2012-08-28 NOTE — ED Notes (Signed)
Per pt. Request belongings given to son Corian Ruse. Except 1 navy blue zipper hoodie and pharmacy medications as listed on patient belonging inventory

## 2012-08-28 NOTE — ED Notes (Signed)
Sleeping, sitter at [facility].

## 2012-08-28 NOTE — ED Notes (Signed)
Awake, sitting upright, watching TV, calm, NAD, sitter at [facility], no changes.

## 2012-08-28 NOTE — ED Notes (Signed)
This RN spoke to [employer]. States pt. Will be picked up next.

## 2012-08-28 NOTE — ED Notes (Signed)
Patient resting while watching tv with NAD. Sitter at bedside.

## 2012-08-28 NOTE — ED Notes (Signed)
Sitter given comb, lotion and tissue box for pt.

## 2012-08-28 NOTE — ED Notes (Signed)
Called sheriff for transport. VM left.

## 2012-08-28 NOTE — ED Notes (Signed)
Sleeping, sitter at [facility].

## 2012-08-28 NOTE — BH Assessment (Signed)
Assessment Note  Update:  Received call from Nunzio Cory at [facility] stating pt accepted to Dr. Ronnald Ramp @ 1600 and that pt could be transported.  Updated EDP Linker and ED staff.  As pt is IVC, Called and left message for [employer] for transport.  ED staff to arrange transport.  Updated assessment disposition, completed assessment notification and faxed to [facility] to log.  Pt's family aware of transport, as her son here visiting at [facility].  Pt to be transferred to [facility].     Disposition:  Disposition Disposition of Patient: Inpatient treatment program Type of inpatient treatment program: Adult (Pt accepted [facility])  On Site Evaluation by:   Reviewed with Physician:  Chalmers Guest 08/28/2012 6:16 PM

## 2012-08-28 NOTE — ED Notes (Signed)
CONTACT  Kaymani Fauth (son of patient)    (Home) [phone]  Call @ Night  (Cell) [phone]     Call during Day

## 2012-08-28 NOTE — ED Notes (Signed)
Patient resting and awake at this time with NAD. Sitter at bedside.

## 2012-08-28 NOTE — ED Notes (Signed)
Pt denies any episodes of diarrhea in the last 24 hours. Pt noted to be sitting in bed eating peaches.

## 2012-09-03 ENCOUNTER — Encounter: Payer: Self-pay | Admitting: Internal Medicine

## 2012-09-20 ENCOUNTER — Encounter: Payer: Self-pay | Admitting: Internal Medicine

## 2012-09-20 ENCOUNTER — Ambulatory Visit (INDEPENDENT_AMBULATORY_CARE_PROVIDER_SITE_OTHER): Payer: Medicare Other | Admitting: Internal Medicine

## 2012-09-20 VITALS — BP 144/83 | HR 77 | Ht 64.0 in | Wt 167.0 lb

## 2012-09-20 DIAGNOSIS — I499 Cardiac arrhythmia, unspecified: Secondary | ICD-10-CM | POA: Diagnosis not present

## 2012-09-20 DIAGNOSIS — Z95810 Presence of automatic (implantable) cardiac defibrillator: Secondary | ICD-10-CM

## 2012-09-20 DIAGNOSIS — I5022 Chronic systolic (congestive) heart failure: Secondary | ICD-10-CM

## 2012-09-20 DIAGNOSIS — I059 Rheumatic mitral valve disease, unspecified: Secondary | ICD-10-CM | POA: Diagnosis not present

## 2012-09-20 DIAGNOSIS — I340 Nonrheumatic mitral (valve) insufficiency: Secondary | ICD-10-CM

## 2012-09-20 LAB — ICD DEVICE OBSERVATION
BRDY-0002RV: 40 {beats}/min
HV IMPEDENCE: 35 Ohm
PACEART VT: 0
RV LEAD THRESHOLD: 0.75 V
TOT-0010: 16
TZAT-0001SLOWVT: 1
TZAT-0012SLOWVT: 200 ms
TZAT-0020SLOWVT: 1 ms
TZON-0005SLOWVT: 6
TZON-0010SLOWVT: 40 ms
TZST-0001SLOWVT: 2
TZST-0001SLOWVT: 4
TZST-0003SLOWVT: 25 J
TZST-0003SLOWVT: 40 J
VENTRICULAR PACING ICD: 0.22 pct
VF: 3

## 2012-09-20 MED ORDER — SIMVASTATIN 40 MG PO TABS: 20.0000 mg | ORAL_TABLET | Freq: Every evening | ORAL | Status: DC

## 2012-09-20 NOTE — Progress Notes (Signed)
HPI [name] returns today for followup. She is a very pleasant 66 year old woman, with coronary disease, mitral regurgitation, chronic systolic heart failure, status post ventricular fibrillation arrest. She is status post ICD implantation. Several weeks ago, she underwent 2-D echo which demonstrated severe mitral regurgitation and mild left ventricular dysfunction, ejection fraction 50%. She has had class III heart failure symptoms in the last several weeks. She does admit to dietary and medical noncompliance. She is been out of her diuretic for over 2 weeks. She did not understand that her Lasix would help reduce her dyspnea. Minimal peripheral edema. She denies anginal symptoms. Allergies  Allergen Reactions  . Codeine Nausea And Vomiting  . Meperidine Hcl Nausea And Vomiting     Current Outpatient Prescriptions  Medication Sig Dispense Refill  . acetaminophen (TYLENOL) 500 MG tablet Take 1,000 mg by mouth every 6 (six) hours as needed. For pain      . ARIPiprazole (ABILIFY) 5 MG tablet Take 5 mg by mouth daily.      Marland Kitchen aspirin EC 325 MG tablet Take 325 mg by mouth every other day.      . Aspirin-Acetaminophen-Caffeine (GOODY HEADACHE PO) Take 1 packet by mouth as needed. For pain      . diazepam (VALIUM) 2 MG tablet Take 2 mg by mouth 3 (three) times daily. For anxiety      . fish oil-omega-3 fatty acids 1000 MG capsule Take 1 g by mouth daily.      . furosemide (LASIX) 40 MG tablet Take 1 tablet (40 mg total) by mouth daily.  90 tablet  3  . hydrOXYzine (ATARAX/VISTARIL) 10 MG tablet Take 10 mg by mouth at bedtime.       Marland Kitchen levothyroxine (SYNTHROID, LEVOTHROID) 88 MCG tablet Take 88 mcg by mouth daily.      Marland Kitchen lisinopril (PRINIVIL,ZESTRIL) 10 MG tablet Take 10 mg by mouth 2 (two) times daily.      . metoprolol succinate (TOPROL-XL) 50 MG 24 hr tablet Take 50 mg by mouth daily. Take with or immediately following a meal.      . Pancrelipase, Lip-Prot-Amyl, 24000 UNITS CPEP Take 1 capsule by  mouth 3 (three) times daily with meals.      . pantoprazole (PROTONIX) 40 MG tablet Take 40 mg by mouth daily.      Marland Kitchen PARoxetine (PAXIL) 30 MG tablet Take 60 mg by mouth daily.       . Polyvinyl Alcohol-Povidone (MURINE TEARS FOR DRY EYES OP) Place 1 drop into both eyes as needed. For dry eyes      . potassium chloride SA (KLOR-CON M20) 20 MEQ tablet Take 1 tablet (20 mEq total) by mouth daily.  90 tablet  3  . simvastatin (ZOCOR) 40 MG tablet Take 0.5 tablets (20 mg total) by mouth every evening.  30 tablet    . sulindac (CLINORIL) 200 MG tablet Take 200 mg by mouth 2 (two) times daily with a meal.         Past Medical History  Diagnosis Date  . Arthritis   . Hypertension   . A-fib   . High cholesterol     ROS:   All systems reviewed and negative except as noted in the HPI.   Past Surgical History  Procedure Date  . Cardiac defibrillator placement   . Abdominal hysterectomy   . Hernia repair as child    x 2     No family history on file.   History   Social  History  . Marital Status: Widowed    Spouse Name: N/A    Number of Children: N/A  . Years of Education: N/A   Occupational History  . Not on file.   Social History Main Topics  . Smoking status: Never Smoker   . Smokeless tobacco: Never Used  . Alcohol Use: No  . Drug Use: No  . Sexually Active: Not on file   Other Topics Concern  . Not on file   Social History Narrative  . No narrative on file     BP 144/83  Pulse 77  Ht 5\' 4"  (1.626 m)  Wt 167 lb (75.751 kg)  BMI 28.67 kg/m2  Physical Exam:  Well appearing NAD HEENT: Unremarkable Neck:  9 cm JVD, no thyromegally Lungs:  Clear except for basilar rales. No wheezes or rhonchi. Well-healed ICD incision. HEART:  Regular rate rhythm, grade 3/6 systolic murmur, no rubs, no clicks Abd:  soft, positive bowel sounds, no organomegally, no rebound, no guarding Ext:  2 plus pulses, no edema, no cyanosis, no clubbing Skin:  No rashes no  nodules Neuro:  CN II through XII intact, motor grossly intact  DEVICE  Normal device function.  See PaceArt for details.   Assess/Plan:

## 2012-09-20 NOTE — Patient Instructions (Addendum)
Your physician wants you to follow-up in: 3 months with the device clinic and 12 months with Dr Knox Saliva will receive a reminder letter in the mail two months in advance. If you don't receive a letter, please call our office to schedule the follow-up appointment.   Your physician has requested that you have a TEE. During a TEE, sound waves are used to create images of your heart. It provides your doctor with information about the size and shape of your heart and how well your heart's chambers and valves are working. In this test, a transducer is attached to the end of a flexible tube that's guided down your throat and into your esophagus (the tube leading from you mouth to your stomach) to get a more detailed image of your heart. You are not awake for the procedure. Please see the instruction sheet given to you today. For further information please visit [email or web address]. Scheduled for 09/24/12 at [facility].  Check in at Short Stay at 11:00am  DO NOT eat or drink after midnight the night before  You will need someone to drive you home   Your physician has recommended you make the following change in your medication:  1) Start Furosemide 40mg  take 2 daily  2) Potassium 32meq daily---take 2 the first day then 1 daily thereafter 3) Decrease Simvastatin to 20mg  daily---take 1/2 tablet daily

## 2012-09-20 NOTE — Assessment & Plan Note (Signed)
Her device is working normally. There been no ventricular arrhythmias.

## 2012-09-20 NOTE — Assessment & Plan Note (Signed)
The patient has worsening mitral regurgitation. She will undergo transesophageal echocardiography in the next several days. She may require repeat catheterization if her mitral regurgitation is bad enough to warrant surgery.

## 2012-09-20 NOTE — Assessment & Plan Note (Signed)
Her fluid index is elevated, her exam suggest mild volume overload, and she is been out of diuretic therapy for 2 weeks. I've recommended that she start back her Lasix and will take 2 tablets the first day followed by 40 mg daily thereafter.

## 2012-09-24 ENCOUNTER — Encounter (HOSPITAL_COMMUNITY)
Admission: RE | Disposition: A | Discharge status: Home / Self Care | Payer: Self-pay | Source: Ambulatory Visit | Attending: Internal Medicine

## 2012-09-24 ENCOUNTER — Ambulatory Visit (HOSPITAL_COMMUNITY)
Admission: RE | Admit: 2012-09-24 | Discharge: 2012-09-24 | Disposition: A | Discharge status: Home / Self Care | Payer: Medicare Other | Source: Ambulatory Visit | Attending: Internal Medicine | Admitting: Internal Medicine

## 2012-09-24 ENCOUNTER — Encounter (HOSPITAL_COMMUNITY): Payer: Self-pay | Admitting: *Deleted

## 2012-09-24 DIAGNOSIS — I059 Rheumatic mitral valve disease, unspecified: Secondary | ICD-10-CM | POA: Diagnosis not present

## 2012-09-24 DIAGNOSIS — I340 Nonrheumatic mitral (valve) insufficiency: Secondary | ICD-10-CM

## 2012-09-24 HISTORY — DX: Gastro-esophageal reflux disease without esophagitis: K21.9

## 2012-09-24 HISTORY — DX: Hypothyroidism, unspecified: E03.9

## 2012-09-24 HISTORY — DX: Depression, unspecified: F32.A

## 2012-09-24 HISTORY — DX: Headache: R51

## 2012-09-24 HISTORY — DX: Heart failure, unspecified: I50.9

## 2012-09-24 HISTORY — PX: TEE WITHOUT CARDIOVERSION: SHX5443

## 2012-09-24 HISTORY — DX: Major depressive disorder, single episode, unspecified: F32.9

## 2012-09-24 SURGERY — ECHOCARDIOGRAM, TRANSESOPHAGEAL
Anesthesia: Moderate Sedation

## 2012-09-24 MED ORDER — MIDAZOLAM HCL 10 MG/2ML IJ SOLN
INTRAMUSCULAR | Status: DC | PRN
  Administered 2012-09-24 (×3): 2 mg via INTRAVENOUS

## 2012-09-24 MED ORDER — MIDAZOLAM HCL 5 MG/ML IJ SOLN
INTRAMUSCULAR | Status: AC
  Filled 2012-09-24: qty 3

## 2012-09-24 MED ORDER — SODIUM CHLORIDE 0.9 % IV SOLN: INTRAVENOUS | Status: DC

## 2012-09-24 MED ORDER — FENTANYL CITRATE 0.05 MG/ML IJ SOLN
INTRAMUSCULAR | Status: DC | PRN
  Administered 2012-09-24 (×2): 25 ug via INTRAVENOUS

## 2012-09-24 MED ORDER — FENTANYL CITRATE 0.05 MG/ML IJ SOLN
INTRAMUSCULAR | Status: AC
  Filled 2012-09-24: qty 2

## 2012-09-24 MED ORDER — LIDOCAINE VISCOUS 2 % MT SOLN
OROMUCOSAL | Status: DC | PRN
  Administered 2012-09-24: 5 mL via OROMUCOSAL

## 2012-09-24 NOTE — Op Note (Signed)
Full report to follow 

## 2012-09-24 NOTE — Progress Notes (Signed)
  Echocardiogram Echocardiogram Transesophageal has been performed.  Philipp Deputy 09/24/2012, 2:41 PM

## 2012-09-25 ENCOUNTER — Encounter (HOSPITAL_COMMUNITY): Payer: Self-pay | Admitting: Internal Medicine

## 2012-09-26 ENCOUNTER — Encounter: Payer: Medicare Other | Admitting: Thoracic Surgery (Cardiothoracic Vascular Surgery)

## 2012-09-28 ENCOUNTER — Encounter: Payer: Medicare Other | Admitting: Thoracic Surgery (Cardiothoracic Vascular Surgery)

## 2012-10-22 ENCOUNTER — Telehealth: Payer: Self-pay | Admitting: Internal Medicine

## 2012-10-22 NOTE — Telephone Encounter (Signed)
New Problem:    Called in because patient stated that Dr. Lovena Le increase her dose of furosemide (LASIX) 40 MG tablet to two tablets daily and they need a new prescription.  Please call back.

## 2012-10-24 ENCOUNTER — Telehealth: Payer: Self-pay | Admitting: *Deleted

## 2012-10-24 MED ORDER — FUROSEMIDE 40 MG PO TABS: 80.0000 mg | ORAL_TABLET | Freq: Every day | ORAL | Status: DC

## 2012-10-24 NOTE — Telephone Encounter (Signed)
Pt was scheduled for 09/26/12 appt with Dr. Roxy Manns which she cancelled due to transporation issues.  We rescheduled the patient for 09/28/12 and she did not come.  I have left several voice messages on the patient's home AM since then asking pt to call our office to reschedule her appt with Dr. Roxy Manns with no response.  Again today at 9:50 am  I have left a voice message asking pt to call our office so we can reschedule her appt to see Dr. Roxy Manns.

## 2012-11-05 ENCOUNTER — Ambulatory Visit: Payer: Medicare Other | Admitting: Internal Medicine

## 2012-12-13 ENCOUNTER — Ambulatory Visit: Payer: Medicare Other | Admitting: Internal Medicine

## 2012-12-20 ENCOUNTER — Encounter: Payer: Medicare Other | Admitting: Cardiology

## 2013-01-15 ENCOUNTER — Encounter: Payer: Self-pay | Admitting: Internal Medicine

## 2013-01-17 ENCOUNTER — Encounter: Payer: Self-pay | Admitting: Internal Medicine

## 2013-02-08 ENCOUNTER — Ambulatory Visit: Payer: Medicare Other | Admitting: Internal Medicine

## 2013-02-13 ENCOUNTER — Encounter: Payer: Self-pay | Admitting: Internal Medicine

## 2013-03-22 DIAGNOSIS — E7800 Pure hypercholesterolemia, unspecified: Secondary | ICD-10-CM | POA: Diagnosis not present

## 2013-03-22 DIAGNOSIS — I509 Heart failure, unspecified: Secondary | ICD-10-CM | POA: Diagnosis not present

## 2013-03-22 DIAGNOSIS — E039 Hypothyroidism, unspecified: Secondary | ICD-10-CM | POA: Diagnosis not present

## 2013-03-22 DIAGNOSIS — I428 Other cardiomyopathies: Secondary | ICD-10-CM | POA: Diagnosis not present

## 2013-03-22 DIAGNOSIS — I10 Essential (primary) hypertension: Secondary | ICD-10-CM | POA: Diagnosis not present

## 2013-05-09 ENCOUNTER — Encounter (HOSPITAL_COMMUNITY): Payer: Self-pay | Admitting: *Deleted

## 2013-05-09 ENCOUNTER — Emergency Department (HOSPITAL_COMMUNITY)
Admission: EM | Admit: 2013-05-09 | Discharge: 2013-05-09 | Disposition: A | Discharge status: Home / Self Care | Payer: Medicare Other | Attending: Emergency Medicine | Admitting: Emergency Medicine

## 2013-05-09 ENCOUNTER — Emergency Department (HOSPITAL_COMMUNITY): Payer: Medicare Other

## 2013-05-09 DIAGNOSIS — E7800 Pure hypercholesterolemia, unspecified: Secondary | ICD-10-CM | POA: Insufficient documentation

## 2013-05-09 DIAGNOSIS — I509 Heart failure, unspecified: Secondary | ICD-10-CM | POA: Insufficient documentation

## 2013-05-09 DIAGNOSIS — Z950 Presence of cardiac pacemaker: Secondary | ICD-10-CM | POA: Diagnosis not present

## 2013-05-09 DIAGNOSIS — M129 Arthropathy, unspecified: Secondary | ICD-10-CM | POA: Diagnosis not present

## 2013-05-09 DIAGNOSIS — R1011 Right upper quadrant pain: Secondary | ICD-10-CM | POA: Diagnosis not present

## 2013-05-09 DIAGNOSIS — I4891 Unspecified atrial fibrillation: Secondary | ICD-10-CM | POA: Diagnosis not present

## 2013-05-09 DIAGNOSIS — Z95810 Presence of automatic (implantable) cardiac defibrillator: Secondary | ICD-10-CM | POA: Diagnosis not present

## 2013-05-09 DIAGNOSIS — E039 Hypothyroidism, unspecified: Secondary | ICD-10-CM | POA: Insufficient documentation

## 2013-05-09 DIAGNOSIS — Z90710 Acquired absence of both cervix and uterus: Secondary | ICD-10-CM | POA: Insufficient documentation

## 2013-05-09 DIAGNOSIS — Z8709 Personal history of other diseases of the respiratory system: Secondary | ICD-10-CM | POA: Diagnosis not present

## 2013-05-09 DIAGNOSIS — R112 Nausea with vomiting, unspecified: Secondary | ICD-10-CM | POA: Insufficient documentation

## 2013-05-09 DIAGNOSIS — R197 Diarrhea, unspecified: Secondary | ICD-10-CM

## 2013-05-09 DIAGNOSIS — R1013 Epigastric pain: Secondary | ICD-10-CM | POA: Insufficient documentation

## 2013-05-09 DIAGNOSIS — Z9889 Other specified postprocedural states: Secondary | ICD-10-CM | POA: Diagnosis not present

## 2013-05-09 DIAGNOSIS — I10 Essential (primary) hypertension: Secondary | ICD-10-CM | POA: Insufficient documentation

## 2013-05-09 DIAGNOSIS — Z79899 Other long term (current) drug therapy: Secondary | ICD-10-CM | POA: Insufficient documentation

## 2013-05-09 DIAGNOSIS — F329 Major depressive disorder, single episode, unspecified: Secondary | ICD-10-CM | POA: Insufficient documentation

## 2013-05-09 DIAGNOSIS — K219 Gastro-esophageal reflux disease without esophagitis: Secondary | ICD-10-CM | POA: Diagnosis not present

## 2013-05-09 DIAGNOSIS — F3289 Other specified depressive episodes: Secondary | ICD-10-CM | POA: Insufficient documentation

## 2013-05-09 LAB — URINALYSIS, ROUTINE W REFLEX MICROSCOPIC
Ketones, ur: 15 mg/dL — AB
Nitrite: NEGATIVE
Protein, ur: >300 mg/dL — AB
Specific Gravity, Urine: 1.025 (ref 1.005–1.030)
Urobilinogen, UA: 1 mg/dL (ref 0.0–1.0)

## 2013-05-09 LAB — COMPREHENSIVE METABOLIC PANEL
ALT: 119 U/L — ABNORMAL HIGH (ref 0–35)
Albumin: 4.5 g/dL (ref 3.5–5.2)
Alkaline Phosphatase: 67 U/L (ref 39–117)
BUN: 22 mg/dL (ref 6–23)
Chloride: 100 mEq/L (ref 96–112)
GFR calc Af Amer: 71 mL/min — ABNORMAL LOW (ref >90)
Glucose, Bld: 139 mg/dL — ABNORMAL HIGH (ref 70–99)
Potassium: 4 mEq/L (ref 3.5–5.1)
Total Bilirubin: 0.9 mg/dL (ref 0.3–1.2)

## 2013-05-09 LAB — CBC WITH DIFFERENTIAL/PLATELET
Basophils Absolute: 0 10*3/uL (ref 0.0–0.1)
Basophils Relative: 0 % (ref 0–1)
Eosinophils Absolute: 0 10*3/uL (ref 0.0–0.7)
HCT: 29 % — ABNORMAL LOW (ref 36.0–46.0)
MCH: 32.4 pg (ref 26.0–34.0)
MCHC: 33.1 g/dL (ref 30.0–36.0)
Monocytes Absolute: 0.3 10*3/uL (ref 0.1–1.0)
Neutro Abs: 5.9 10*3/uL (ref 1.7–7.7)
RDW: 14.5 % (ref 11.5–15.5)

## 2013-05-09 LAB — POCT I-STAT TROPONIN I: Troponin i, poc: 0.06 ng/mL (ref 0.00–0.08)

## 2013-05-09 LAB — LIPASE, BLOOD: Lipase: 19 U/L (ref 11–59)

## 2013-05-09 LAB — URINE MICROSCOPIC-ADD ON

## 2013-05-09 MED ORDER — HYDROMORPHONE HCL PF 1 MG/ML IJ SOLN
0.5000 mg | Freq: Once | INTRAMUSCULAR | Status: AC
  Administered 2013-05-09: 0.5 mg via INTRAVENOUS
  Filled 2013-05-09: qty 1

## 2013-05-09 MED ORDER — LACTATED RINGERS IV BOLUS (SEPSIS)
1000.0000 mL | Freq: Once | INTRAVENOUS | Status: AC
  Administered 2013-05-09: 1000 mL via INTRAVENOUS

## 2013-05-09 MED ORDER — ONDANSETRON HCL 4 MG PO TABS: 4.0000 mg | ORAL_TABLET | Freq: Four times a day (QID) | ORAL | Status: DC

## 2013-05-09 MED ORDER — ONDANSETRON HCL 4 MG/2ML IJ SOLN
4.0000 mg | Freq: Once | INTRAMUSCULAR | Status: AC
  Administered 2013-05-09: 4 mg via INTRAVENOUS
  Filled 2013-05-09: qty 2

## 2013-05-09 MED ORDER — PANTOPRAZOLE SODIUM 40 MG IV SOLR
40.0000 mg | Freq: Once | INTRAVENOUS | Status: AC
  Administered 2013-05-09: 40 mg via INTRAVENOUS
  Filled 2013-05-09: qty 40

## 2013-05-09 MED ORDER — GI COCKTAIL ~~LOC~~
30.0000 mL | Freq: Once | ORAL | Status: AC
  Administered 2013-05-09: 30 mL via ORAL
  Filled 2013-05-09: qty 30

## 2013-05-09 NOTE — ED Provider Notes (Signed)
History    CSN: [id number] Arrival date & time 05/09/13  [id number]  First MD Initiated Contact with Patient 05/09/13 0636     Chief Complaint  Patient presents with  . Nausea  . Emesis  . Diarrhea   HPI [name] is a 67 y.o. female who presents to the emergency department with nausea vomiting and diarrhea since Monday. Nausea and vomiting has been accompanied with some upper abdominal pain in both left and right upper quadrants she makes a straight-line motion across both upper quadrants and says this pain does not radiate, it is intermittent, it is dull and occasionally burning.  She does have a history of gastroesophageal reflux disease for which he takes medication for. She is keeping food and medications down. During the period lasts 4 days she's also had associated diarrhea without blood or black stools. Denies any chest pain, shortness of breath, fever, chills, rash, myalgias  Past Medical History  Diagnosis Date  . Arthritis   . Hypertension   . A-fib   . High cholesterol   . Hypothyroidism   . Depression   . Shortness of breath     exertion  . Pacemaker     icd  . CHF (congestive heart failure)   . GERD (gastroesophageal reflux disease)   . Headache    Past Surgical History  Procedure Laterality Date  . Cardiac defibrillator placement    . Abdominal hysterectomy    . Hernia repair  as child    x 2  . Tee without cardioversion  09/24/2012    Procedure: TRANSESOPHAGEAL ECHOCARDIOGRAM (TEE);  Surgeon: Fay Records, MD;  Location: [facility] ENDOSCOPY;  Service: Cardiovascular;  Laterality: N/A;   No family history on file. History  Substance Use Topics  . Smoking status: Never Smoker   . Smokeless tobacco: Never Used  . Alcohol Use: No   OB History   Grav Para Term Preterm Abortions TAB SAB Ect Mult Living                 Review of Systems At least 10pt or greater review of systems completed and are negative except where specified in the HPI.  Allergies  Codeine  and Meperidine hcl  Home Medications   Current Outpatient Rx  Name  Route  Sig  Dispense  Refill  . acetaminophen (TYLENOL) 500 MG tablet   Oral   Take 1,000 mg by mouth every 6 (six) hours as needed. For pain         . aspirin EC 325 MG tablet   Oral   Take 325 mg by mouth every other day.         . Aspirin-Acetaminophen-Caffeine (GOODY HEADACHE PO)   Oral   Take 1 packet by mouth as needed. For pain         . diazepam (VALIUM) 2 MG tablet   Oral   Take 2 mg by mouth 3 (three) times daily. For anxiety         . fish oil-omega-3 fatty acids 1000 MG capsule   Oral   Take 1 g by mouth daily.         . furosemide (LASIX) 40 MG tablet   Oral   Take 2 tablets (80 mg total) by mouth daily.   180 tablet   2   . hydrOXYzine (ATARAX/VISTARIL) 10 MG tablet   Oral   Take 10 mg by mouth at bedtime.          Marland Kitchen  levothyroxine (SYNTHROID, LEVOTHROID) 88 MCG tablet   Oral   Take 88 mcg by mouth daily.         Marland Kitchen lisinopril (PRINIVIL,ZESTRIL) 10 MG tablet   Oral   Take 10 mg by mouth 2 (two) times daily.         . metoprolol succinate (TOPROL-XL) 50 MG 24 hr tablet   Oral   Take 50 mg by mouth daily. Take with or immediately following a meal.         . pantoprazole (PROTONIX) 40 MG tablet   Oral   Take 40 mg by mouth daily.         Marland Kitchen PARoxetine (PAXIL) 30 MG tablet   Oral   Take 60 mg by mouth daily.          . Polyvinyl Alcohol-Povidone (MURINE TEARS FOR DRY EYES OP)   Both Eyes   Place 1 drop into both eyes as needed. For dry eyes         . potassium chloride SA (KLOR-CON M20) 20 MEQ tablet   Oral   Take 1 tablet (20 mEq total) by mouth daily.   90 tablet   3   . simvastatin (ZOCOR) 40 MG tablet   Oral   Take 0.5 tablets (20 mg total) by mouth every evening.   30 tablet       There were no vitals taken for this visit. Physical Exam  Nursing notes reviewed.  Electronic medical record reviewed. VITAL SIGNS:   Filed Vitals:   05/09/13  0648 05/09/13 0715  BP: 153/83 146/90  Pulse: 81 78  Temp: 98.2 F (36.8 C)   TempSrc: Oral   Resp: 18   Height: 5\' 4"  (1.626 m)   Weight: 170 lb (77.111 kg)   SpO2: 94% 90%   CONSTITUTIONAL: Awake, oriented, appears non-toxic HENT: Atraumatic, normocephalic, oral mucosa pink and moist, airway patent. Nares patent without drainage. External ears normal. EYES: Conjunctiva clear, EOMI, PERRLA NECK: Trachea midline, non-tender, supple CARDIOVASCULAR: Normal heart rate, Normal rhythm, No murmurs, rubs, gallops PULMONARY/CHEST: Clear to auscultation, no rhonchi, wheezes, or rales. Symmetrical breath sounds. Non-tender. ABDOMINAL: Non-distended, soft, mildly tender to palpation in the epigastrium.  BS normal. NEUROLOGIC: Non-focal, moving all four extremities, no gross sensory or motor deficits. EXTREMITIES: No clubbing, cyanosis, or edema SKIN: Warm, Dry, No erythema, No rash  ED Course  Procedures (including critical care time)  Date: 05/09/2013  Rate: 70  Rhythm: normal sinus rhythm  QRS Axis: normal  Intervals: normal  ST/T Wave abnormalities: normal  Conduction Disutrbances: none  Narrative Interpretation: unremarkable   Labs Reviewed  COMPREHENSIVE METABOLIC PANEL - Abnormal; Notable for the following:    Glucose, Bld 139 (*)    AST 161 (*)    ALT 119 (*)    GFR calc non Af Amer 62 (*)    GFR calc Af Amer 71 (*)    All other components within normal limits  CBC WITH DIFFERENTIAL - Abnormal; Notable for the following:    RBC 2.96 (*)    Hemoglobin 9.6 (*)    HCT 29.0 (*)    Neutrophils Relative % 87 (*)    Lymphocytes Relative 9 (*)    Lymphs Abs 0.6 (*)    All other components within normal limits  LIPASE, BLOOD  URINALYSIS, ROUTINE W REFLEX MICROSCOPIC  POCT I-STAT TROPONIN I   No results found. 1. Nausea and vomiting   2. Diarrhea   3. Epigastric pain  MDM  [name] is a 67 y.o. female presents with 40s history of nausea vomiting and diarrhea  suggestive of acute gastroenteritis. Patient may also have exacerbation of her chronic gastroesophageal reflux disease. Treat the patient with some pain medicine, nausea medicine, GI cocktail as well as Protonix dose. Basic labs checked secondary to possible dehydration although the patient has good skin turgor does not appear dehydrated, vital signs are stable and within normal limits the exception of some mild hypertension, she is on medication for.   Urinalysis is consistent with some mild dehydration, no elevation in BUN or creatinine, she does have some abnormal liver function tests including AST 161 and ALT of 119. No elevation in lipase or alkaline phosphatase or bilirubin-does not suggest an obstructive process at this time. Obtain a right upper quadrant ultrasound for further elucidation. Do not think she's got a urinary tract infection either. Patient is feeling much better after medication, she no longer has pain, she is no longer nauseous. Patient's symptoms have been going on since Monday, do not think are cardiac related, her EKG is unchanged and troponin is negative.  If [place] is unremarkable and pt is still asymptomatic with an unremarkable abdominal exam, I think she may be able to be discharged home with Zofran - pt has loperamide at home.  Care of patient transferred to Dr. Gilford Rile for final disposition.    Rhunette Croft, MD 05/09/13 [phone]

## 2013-05-09 NOTE — ED Notes (Signed)
Per EMS: pt coming from home with c/o n/v/d for two-three days. Pt reports diarrhea on Monday, n/v started on Tuesday. Pt has no vomited with EMS, pt reports last episode of vomiting was 6pm last night. Pt is A&Ox4, respirations equal and unlabored, skin warm and dry.

## 2013-05-14 ENCOUNTER — Encounter (HOSPITAL_COMMUNITY): Payer: Self-pay | Admitting: Emergency Medicine

## 2013-05-14 ENCOUNTER — Emergency Department (HOSPITAL_COMMUNITY): Payer: Medicare Other

## 2013-05-14 ENCOUNTER — Inpatient Hospital Stay (HOSPITAL_COMMUNITY)
Admission: EM | Admit: 2013-05-14 | Discharge: 2013-05-31 | DRG: 216 | Disposition: A | Discharge status: Home Health | Payer: Medicare Other | Attending: Cardiothoracic Surgery | Admitting: Cardiothoracic Surgery

## 2013-05-14 DIAGNOSIS — I509 Heart failure, unspecified: Secondary | ICD-10-CM | POA: Diagnosis present

## 2013-05-14 DIAGNOSIS — E7800 Pure hypercholesterolemia, unspecified: Secondary | ICD-10-CM | POA: Diagnosis not present

## 2013-05-14 DIAGNOSIS — E039 Hypothyroidism, unspecified: Secondary | ICD-10-CM | POA: Diagnosis not present

## 2013-05-14 DIAGNOSIS — I4891 Unspecified atrial fibrillation: Secondary | ICD-10-CM | POA: Diagnosis not present

## 2013-05-14 DIAGNOSIS — R059 Cough, unspecified: Secondary | ICD-10-CM | POA: Diagnosis not present

## 2013-05-14 DIAGNOSIS — Z888 Allergy status to other drugs, medicaments and biological substances status: Secondary | ICD-10-CM

## 2013-05-14 DIAGNOSIS — D62 Acute posthemorrhagic anemia: Secondary | ICD-10-CM | POA: Diagnosis not present

## 2013-05-14 DIAGNOSIS — R0609 Other forms of dyspnea: Secondary | ICD-10-CM | POA: Diagnosis not present

## 2013-05-14 DIAGNOSIS — R404 Transient alteration of awareness: Secondary | ICD-10-CM | POA: Diagnosis not present

## 2013-05-14 DIAGNOSIS — Z7901 Long term (current) use of anticoagulants: Secondary | ICD-10-CM | POA: Diagnosis not present

## 2013-05-14 DIAGNOSIS — K219 Gastro-esophageal reflux disease without esophagitis: Secondary | ICD-10-CM | POA: Diagnosis not present

## 2013-05-14 DIAGNOSIS — I2789 Other specified pulmonary heart diseases: Secondary | ICD-10-CM | POA: Diagnosis present

## 2013-05-14 DIAGNOSIS — I499 Cardiac arrhythmia, unspecified: Secondary | ICD-10-CM | POA: Diagnosis present

## 2013-05-14 DIAGNOSIS — I519 Heart disease, unspecified: Secondary | ICD-10-CM | POA: Diagnosis not present

## 2013-05-14 DIAGNOSIS — J9819 Other pulmonary collapse: Secondary | ICD-10-CM | POA: Diagnosis not present

## 2013-05-14 DIAGNOSIS — R7401 Elevation of levels of liver transaminase levels: Secondary | ICD-10-CM | POA: Diagnosis not present

## 2013-05-14 DIAGNOSIS — Z452 Encounter for adjustment and management of vascular access device: Secondary | ICD-10-CM | POA: Diagnosis not present

## 2013-05-14 DIAGNOSIS — Z79899 Other long term (current) drug therapy: Secondary | ICD-10-CM | POA: Diagnosis not present

## 2013-05-14 DIAGNOSIS — Z7982 Long term (current) use of aspirin: Secondary | ICD-10-CM

## 2013-05-14 DIAGNOSIS — I059 Rheumatic mitral valve disease, unspecified: Secondary | ICD-10-CM | POA: Diagnosis not present

## 2013-05-14 DIAGNOSIS — Z8674 Personal history of sudden cardiac arrest: Secondary | ICD-10-CM | POA: Diagnosis not present

## 2013-05-14 DIAGNOSIS — I5023 Acute on chronic systolic (congestive) heart failure: Secondary | ICD-10-CM | POA: Diagnosis not present

## 2013-05-14 DIAGNOSIS — Z9889 Other specified postprocedural states: Secondary | ICD-10-CM

## 2013-05-14 DIAGNOSIS — I5022 Chronic systolic (congestive) heart failure: Secondary | ICD-10-CM | POA: Diagnosis not present

## 2013-05-14 DIAGNOSIS — Z4682 Encounter for fitting and adjustment of non-vascular catheter: Secondary | ICD-10-CM | POA: Diagnosis not present

## 2013-05-14 DIAGNOSIS — K083 Retained dental root: Secondary | ICD-10-CM | POA: Diagnosis present

## 2013-05-14 DIAGNOSIS — K0530 Chronic periodontitis, unspecified: Secondary | ICD-10-CM | POA: Diagnosis not present

## 2013-05-14 DIAGNOSIS — D649 Anemia, unspecified: Secondary | ICD-10-CM

## 2013-05-14 DIAGNOSIS — R5381 Other malaise: Secondary | ICD-10-CM | POA: Diagnosis not present

## 2013-05-14 DIAGNOSIS — R05 Cough: Secondary | ICD-10-CM | POA: Diagnosis not present

## 2013-05-14 DIAGNOSIS — Z95810 Presence of automatic (implantable) cardiac defibrillator: Secondary | ICD-10-CM

## 2013-05-14 DIAGNOSIS — J811 Chronic pulmonary edema: Secondary | ICD-10-CM | POA: Diagnosis not present

## 2013-05-14 DIAGNOSIS — E785 Hyperlipidemia, unspecified: Secondary | ICD-10-CM | POA: Diagnosis present

## 2013-05-14 DIAGNOSIS — M899 Disorder of bone, unspecified: Secondary | ICD-10-CM | POA: Diagnosis present

## 2013-05-14 DIAGNOSIS — I340 Nonrheumatic mitral (valve) insufficiency: Secondary | ICD-10-CM | POA: Diagnosis present

## 2013-05-14 DIAGNOSIS — F329 Major depressive disorder, single episode, unspecified: Secondary | ICD-10-CM | POA: Diagnosis present

## 2013-05-14 DIAGNOSIS — K589 Irritable bowel syndrome without diarrhea: Secondary | ICD-10-CM | POA: Diagnosis present

## 2013-05-14 DIAGNOSIS — M4120 Other idiopathic scoliosis, site unspecified: Secondary | ICD-10-CM | POA: Diagnosis present

## 2013-05-14 DIAGNOSIS — I10 Essential (primary) hypertension: Secondary | ICD-10-CM | POA: Diagnosis not present

## 2013-05-14 DIAGNOSIS — F411 Generalized anxiety disorder: Secondary | ICD-10-CM | POA: Diagnosis not present

## 2013-05-14 DIAGNOSIS — I5033 Acute on chronic diastolic (congestive) heart failure: Secondary | ICD-10-CM | POA: Diagnosis not present

## 2013-05-14 DIAGNOSIS — K029 Dental caries, unspecified: Secondary | ICD-10-CM | POA: Diagnosis present

## 2013-05-14 DIAGNOSIS — Z602 Problems related to living alone: Secondary | ICD-10-CM

## 2013-05-14 DIAGNOSIS — F3289 Other specified depressive episodes: Secondary | ICD-10-CM | POA: Diagnosis present

## 2013-05-14 DIAGNOSIS — R0602 Shortness of breath: Secondary | ICD-10-CM | POA: Diagnosis not present

## 2013-05-14 DIAGNOSIS — Z01810 Encounter for preprocedural cardiovascular examination: Secondary | ICD-10-CM | POA: Diagnosis not present

## 2013-05-14 DIAGNOSIS — R0989 Other specified symptoms and signs involving the circulatory and respiratory systems: Secondary | ICD-10-CM | POA: Diagnosis not present

## 2013-05-14 DIAGNOSIS — J90 Pleural effusion, not elsewhere classified: Secondary | ICD-10-CM | POA: Diagnosis not present

## 2013-05-14 LAB — CBC WITH DIFFERENTIAL/PLATELET
Eosinophils Relative: 1 % (ref 0–5)
HCT: 28.8 % — ABNORMAL LOW (ref 36.0–46.0)
Hemoglobin: 9.7 g/dL — ABNORMAL LOW (ref 12.0–15.0)
Lymphocytes Relative: 16 % (ref 12–46)
Lymphs Abs: 1 10*3/uL (ref 0.7–4.0)
MCV: 98.6 fL (ref 78.0–100.0)
Monocytes Relative: 6 % (ref 3–12)
Platelets: 236 10*3/uL (ref 150–400)
RBC: 2.92 MIL/uL — ABNORMAL LOW (ref 3.87–5.11)
WBC: 5.8 10*3/uL (ref 4.0–10.5)

## 2013-05-14 NOTE — ED Provider Notes (Signed)
CSN: [id number]     Arrival date & time 05/14/13  2214 History     First MD Initiated Contact with Patient 05/14/13 2304     Chief Complaint  Patient presents with  . Weakness   (Consider location/radiation/quality/duration/timing/severity/associated sxs/prior Treatment) Patient is a 67 y.o. female presenting with weakness. The history is provided by the patient.  Weakness  She was seen in the emergency Department 5 days ago with vomiting and diarrhea. She continues to have nausea. Over the last several days, she has noted dyspnea with exertion and when she does walk, she states that her arms and legs start to ache. That has gotten worse to the point where she states she can only go up one or 2 steps before she is out of breath and arms and legs ache. She continues to have epigastric pain without radiation. There's been a cough which is intermittently productive of yellowish sputum. She denies chest pain, heaviness, tightness, pressure. She states she is no longer having any diarrhea. Nothing makes symptoms better nothing makes it worse. She has been running a low-grade fever at home but has not taken her temperature.  Past Medical History  Diagnosis Date  . Arthritis   . Hypertension   . A-fib   . High cholesterol   . Hypothyroidism   . Depression   . Shortness of breath     exertion  . Pacemaker     icd  . CHF (congestive heart failure)   . GERD (gastroesophageal reflux disease)   . [id number])    Past Surgical History  Procedure Laterality Date  . Cardiac defibrillator placement    . Abdominal hysterectomy    . Hernia repair  as child    x 2  . Tee without cardioversion  09/24/2012    Procedure: TRANSESOPHAGEAL ECHOCARDIOGRAM (TEE);  Surgeon: Fay Records, MD;  Location: [facility] ENDOSCOPY;  Service: Cardiovascular;  Laterality: N/A;   History reviewed. No pertinent family history. History  Substance Use Topics  . Smoking status: Never Smoker   . Smokeless tobacco: Never  Used  . Alcohol Use: No   OB History   Grav Para Term Preterm Abortions TAB SAB Ect Mult Living                 Review of Systems  Neurological: Positive for weakness.  All other systems reviewed and are negative.    Allergies  Codeine and Meperidine hcl  Home Medications   Current Outpatient Rx  Name  Route  Sig  Dispense  Refill  . acetaminophen (TYLENOL) 500 MG tablet   Oral   Take 1,000 mg by mouth every 6 (six) hours as needed. For pain         . aspirin EC 325 MG tablet   Oral   Take 325 mg by mouth every other day.         . Aspirin-Acetaminophen-Caffeine (GOODY HEADACHE PO)   Oral   Take 1 packet by mouth as needed. For pain         . diazepam (VALIUM) 2 MG tablet   Oral   Take 2 mg by mouth 3 (three) times daily. For anxiety         . fish oil-omega-3 fatty acids 1000 MG capsule   Oral   Take 1 g by mouth daily.         . furosemide (LASIX) 40 MG tablet   Oral   Take 2 tablets (80 mg total) by mouth daily.  180 tablet   2   . hydrOXYzine (ATARAX/VISTARIL) 10 MG tablet   Oral   Take 10 mg by mouth at bedtime.          Marland Kitchen levothyroxine (SYNTHROID, LEVOTHROID) 88 MCG tablet   Oral   Take 88 mcg by mouth daily.         Marland Kitchen lisinopril (PRINIVIL,ZESTRIL) 10 MG tablet   Oral   Take 10 mg by mouth 2 (two) times daily.         . metoprolol succinate (TOPROL-XL) 50 MG 24 hr tablet   Oral   Take 50 mg by mouth daily. Take with or immediately following a meal.         . ondansetron (ZOFRAN) 4 MG tablet   Oral   Take 1 tablet (4 mg total) by mouth every 6 (six) hours.   12 tablet   0   . pantoprazole (PROTONIX) 40 MG tablet   Oral   Take 40 mg by mouth daily.         Marland Kitchen PARoxetine (PAXIL) 30 MG tablet   Oral   Take 60 mg by mouth daily.          . Polyvinyl Alcohol-Povidone (MURINE TEARS FOR DRY EYES OP)   Both Eyes   Place 1 drop into both eyes as needed. For dry eyes         . potassium chloride SA (KLOR-CON M20) 20  MEQ tablet   Oral   Take 1 tablet (20 mEq total) by mouth daily.   90 tablet   3   . simvastatin (ZOCOR) 40 MG tablet   Oral   Take 0.5 tablets (20 mg total) by mouth every evening.   30 tablet       BP 141/77  Pulse 65  Temp(Src) 98.8 F (37.1 C) (Oral)  Resp 16  SpO2 96% Physical Exam  Nursing note and vitals reviewed.  67 year old female, resting comfortably and in no acute distress. Vital signs are significant for borderline hypertension with blood pressure 141/77. Oxygen saturation is 96%, which is normal. Head is normocephalic and atraumatic. PERRLA, EOMI. Oropharynx is clear. Conjunctivae are pale. Neck is nontender and supple without adenopathy or JVD. Back is nontender and there is no CVA tenderness. Lungs are clear without rales, wheezes, or rhonchi. Chest is nontender. Heart has regular rate and rhythm with 3/6 systolic ejection murmur along the left sternal border with radiation to the apex. Abdomen is soft, flat, with mild epigastric tenderness. There is no rebound or guarding. There are no masses or hepatosplenomegaly and peristalsis is normoactive. Extremities have trace edema, full range of motion is present. Skin is warm and dry without rash. Neurologic: Mental status is normal, cranial nerves are intact, there are no motor or sensory deficits.  ED Course   Procedures (including critical care time)  Results for orders placed during the hospital encounter of 05/14/13  CBC WITH DIFFERENTIAL      Result Value Range   WBC 5.8  4.0 - 10.5 K/uL   RBC 2.92 (*) 3.87 - 5.11 MIL/uL   Hemoglobin 9.7 (*) 12.0 - 15.0 g/dL   HCT 28.8 (*) 36.0 - 46.0 %   MCV 98.6  78.0 - 100.0 fL   MCH 33.2  26.0 - 34.0 pg   MCHC 33.7  30.0 - 36.0 g/dL   RDW 15.6 (*) 11.5 - 15.5 %   Platelets 236  150 - 400 K/uL   Neutrophils Relative % 77  43 - 77 %   Neutro Abs 4.5  1.7 - 7.7 K/uL   Lymphocytes Relative 16  12 - 46 %   Lymphs Abs 1.0  0.7 - 4.0 K/uL   Monocytes Relative 6  3  - 12 %   Monocytes Absolute 0.3  0.1 - 1.0 K/uL   Eosinophils Relative 1  0 - 5 %   Eosinophils Absolute 0.1  0.0 - 0.7 K/uL   Basophils Relative 0  0 - 1 %   Basophils Absolute 0.0  0.0 - 0.1 K/uL  URINALYSIS, ROUTINE W REFLEX MICROSCOPIC      Result Value Range   Color, Urine AMBER (*) YELLOW   APPearance CLEAR  CLEAR   Specific Gravity, Urine 1.020  1.005 - 1.030   pH 6.0  5.0 - 8.0   Glucose, UA NEGATIVE  NEGATIVE mg/dL   Hgb urine dipstick TRACE (*) NEGATIVE   Bilirubin Urine SMALL (*) NEGATIVE   Ketones, ur 15 (*) NEGATIVE mg/dL   Protein, ur >300 (*) NEGATIVE mg/dL   Urobilinogen, UA 1.0  0.0 - 1.0 mg/dL   Nitrite NEGATIVE  NEGATIVE   Leukocytes, UA SMALL (*) NEGATIVE  TROPONIN I      Result Value Range   Troponin I <0.30  <0.30 ng/mL  COMPREHENSIVE METABOLIC PANEL      Result Value Range   Sodium 136  135 - 145 mEq/L   Potassium 4.8  3.5 - 5.1 mEq/L   Chloride 100  96 - 112 mEq/L   CO2 22  19 - 32 mEq/L   Glucose, Bld 101 (*) 70 - 99 mg/dL   BUN 15  6 - 23 mg/dL   Creatinine, Ser 1.01  0.50 - 1.10 mg/dL   Calcium 9.2  8.4 - 10.5 mg/dL   Total Protein 6.4  6.0 - 8.3 g/dL   Albumin 3.8  3.5 - 5.2 g/dL   AST 69 (*) 0 - 37 U/L   ALT 78 (*) 0 - 35 U/L   Alkaline Phosphatase 64  39 - 117 U/L   Total Bilirubin 0.9  0.3 - 1.2 mg/dL   GFR calc non Af Amer 57 (*) >90 mL/min   GFR calc Af Amer 66 (*) >90 mL/min  LIPASE, BLOOD      Result Value Range   Lipase 19  11 - 59 U/L  URINE MICROSCOPIC-ADD ON      Result Value Range   Squamous Epithelial / LPF FEW (*) RARE   WBC, UA 3-6  <3 WBC/hpf   RBC / HPF 0-2  <3 RBC/hpf   Bacteria, UA RARE  RARE  PRO B NATRIURETIC PEPTIDE      Result Value Range   Pro B Natriuretic peptide (BNP) 13764.0 (*) 0 - 125 pg/mL  D-DIMER, QUANTITATIVE      Result Value Range   D-Dimer, Quant 1.34 (*) 0.00 - 0.48 ug/mL-FEU   Dg Chest 2 View  05/14/2013   *RADIOLOGY REPORT*  Clinical Data: Weakness, shortness of breath and cough.  CHEST - 2  VIEW  Comparison: 06/28/2012  Findings: Implanted cardiac defibrillator shows stable appearance. There is stable cardiomegaly without evidence of pulmonary edema. No infiltrates or pleural fluid are identified.  The bony thorax shows osteopenia and probable mild loss of height of some of the mid thoracic vertebral bodies which is stable.  IMPRESSION: No active disease.  Stable cardiomegaly and appearance of defibrillator.   Original Report Authenticated By: Aletta Edouard, M.D.   Ct Angio Chest  Pe W/cm &/or Wo Cm  05/15/2013   *RADIOLOGY REPORT*  Clinical Data: Short of breath.  Dyspnea.  Chest pain.  Elevated D- dimer.  CT ANGIOGRAPHY CHEST  Technique:  Multidetector CT imaging of the chest using the standard protocol during bolus administration of intravenous contrast. Multiplanar reconstructed images including MIPs were obtained and reviewed to evaluate the vascular anatomy.  Contrast: 159mL OMNIPAQUE IOHEXOL 350 MG/ML SOLN  Comparison: Radiograph 05/14/2013.  Findings: Technically adequate study without pulmonary embolus. Cardiomegaly.  Small pericardial effusion.  Dense calcification of the tracheobronchial tree.  Small bilateral dependently layering pleural effusions.  Aortic atherosclerosis without aneurysm.  Small hiatal hernia.  There is diffuse ground-glass attenuation of the lungs.  This probably relates to a combination of expiratory phase imaging, areas of air trapping and interstitial pulmonary edema.  Interlobular apical septal thickening is present, indicating interstitial pulmonary edema.  There is no axillary adenopathy.  No mediastinal or hilar adenopathy.  Enlargement of the pulmonary artery is probably due to vascular congestion and volume overload.  Exaggerated thoracic kyphosis.  No aggressive osseous lesions.  Right subclavian AICD.  IMPRESSION: 1.  Technically adequate study without pulmonary embolism. 2.  Cardiomegaly, pleural effusions, pulmonary edema are all consistent with CHF.    Original Report Authenticated By: Dereck Ligas, M.D.   Images viewed by me.    Date: 05/14/2013  Rate: 65  Rhythm: normal sinus rhythm  QRS Axis: normal  Intervals: normal  ST/T Wave abnormalities: nonspecific ST changes  Conduction Disutrbances:none  Narrative Interpretation: Nonspecific ST changes. When compared with ECG of 05/09/2013, QT interval has shortened.  Old EKG Reviewed: changes noted   1. Acute on chronic systolic CHF (congestive heart failure), NYHA class 3   2. Anemia   3. Elevated transaminase level     MDM  Weakness with nausea, cough, exertional dyspnea. This may be part of an ongoing viral syndrome. There are ED record from 5 days ago says she was anemic and hemoglobin will need to be rechecked. Chest x-ray or be obtained to rule out pneumonia and urinalysis obtained to rule out UTI.  Initial workup is unremarkable. Hemoglobin is stable. There is mild elevation of transaminases which is improved over most recent transaminases and urinalysis does not show evidence of significant UTI. She will be ambulated in the ED. BNP and d-dimer will be checked.  D-dimer is noted to be elevated and BNP is significantly elevated. I suspect that her main problem is congestive heart failure and she's given a dose of furosemide. However, she will be sent for CT angiogram because of elevated d-dimer.  CT is negative for pulmonary embolism. She is still dyspneic and not stable to go home. Case is discussed with Dr. Arnoldo Morale of [facility] who agrees to admit the patient under observation status.  Delora Fuel, MD [id number]

## 2013-05-14 NOTE — ED Notes (Signed)
Lab finished at [facility], pt alert, NAD< calm, interactive, resps e/u, speaking in clear complete sentences, speaking with EDP Dr. Roxanne Mins at [facility].

## 2013-05-14 NOTE — ED Notes (Signed)
Pt to ED via EMS with c/o generalize weakness, onset about a week. Pt alert and oriented x4. BP-150/100, HR-68, SpO2-98% on room air, RR-18. No S/S of distress noted at this time.

## 2013-05-15 ENCOUNTER — Encounter (HOSPITAL_COMMUNITY): Payer: Self-pay | Admitting: Radiology

## 2013-05-15 ENCOUNTER — Telehealth (HOSPITAL_COMMUNITY): Payer: Self-pay | Admitting: *Deleted

## 2013-05-15 ENCOUNTER — Emergency Department (HOSPITAL_COMMUNITY): Payer: Medicare Other

## 2013-05-15 DIAGNOSIS — F411 Generalized anxiety disorder: Secondary | ICD-10-CM | POA: Diagnosis present

## 2013-05-15 DIAGNOSIS — I059 Rheumatic mitral valve disease, unspecified: Secondary | ICD-10-CM

## 2013-05-15 DIAGNOSIS — I4891 Unspecified atrial fibrillation: Secondary | ICD-10-CM | POA: Diagnosis present

## 2013-05-15 DIAGNOSIS — E785 Hyperlipidemia, unspecified: Secondary | ICD-10-CM

## 2013-05-15 LAB — URINALYSIS, ROUTINE W REFLEX MICROSCOPIC
Glucose, UA: NEGATIVE mg/dL
Ketones, ur: 15 mg/dL — AB
pH: 6 (ref 5.0–8.0)

## 2013-05-15 LAB — D-DIMER, QUANTITATIVE: D-Dimer, Quant: 1.34 ug/mL-FEU — ABNORMAL HIGH (ref 0.00–0.48)

## 2013-05-15 LAB — LIPID PANEL
LDL Cholesterol: 90 mg/dL (ref 0–99)
Total CHOL/HDL Ratio: 4.1 RATIO
Triglycerides: 140 mg/dL (ref <150)
VLDL: 28 mg/dL (ref 0–40)

## 2013-05-15 LAB — COMPREHENSIVE METABOLIC PANEL
ALT: 83 U/L — ABNORMAL HIGH (ref 0–35)
AST: 69 U/L — ABNORMAL HIGH (ref 0–37)
AST: 75 U/L — ABNORMAL HIGH (ref 0–37)
Albumin: 3.8 g/dL (ref 3.5–5.2)
Alkaline Phosphatase: 64 U/L (ref 39–117)
Alkaline Phosphatase: 67 U/L (ref 39–117)
BUN: 15 mg/dL (ref 6–23)
CO2: 25 mEq/L (ref 19–32)
Chloride: 100 mEq/L (ref 96–112)
GFR calc Af Amer: 57 mL/min — ABNORMAL LOW (ref >90)
GFR calc non Af Amer: 49 mL/min — ABNORMAL LOW (ref >90)
Glucose, Bld: 102 mg/dL — ABNORMAL HIGH (ref 70–99)
Potassium: 4.8 mEq/L (ref 3.5–5.1)
Potassium: 3.9 mEq/L (ref 3.5–5.1)
Sodium: 136 mEq/L (ref 135–145)
Total Bilirubin: 0.9 mg/dL (ref 0.3–1.2)
Total Protein: 6.6 g/dL (ref 6.0–8.3)

## 2013-05-15 LAB — CBC WITH DIFFERENTIAL/PLATELET
Basophils Absolute: 0 10*3/uL (ref 0.0–0.1)
Basophils Relative: 0 % (ref 0–1)
Eosinophils Absolute: 0.1 10*3/uL (ref 0.0–0.7)
HCT: 28.8 % — ABNORMAL LOW (ref 36.0–46.0)
MCH: 33.3 pg (ref 26.0–34.0)
MCHC: 34 g/dL (ref 30.0–36.0)
Monocytes Absolute: 0.6 10*3/uL (ref 0.1–1.0)
Monocytes Relative: 9 % (ref 3–12)
Neutro Abs: 5 10*3/uL (ref 1.7–7.7)
RDW: 15.6 % — ABNORMAL HIGH (ref 11.5–15.5)

## 2013-05-15 LAB — MAGNESIUM: Magnesium: 2.4 mg/dL (ref 1.5–2.5)

## 2013-05-15 LAB — TROPONIN I
Troponin I: <0.3 ng/mL (ref <0.30)
Troponin I: <0.3 ng/mL (ref <0.30)
Troponin I: <0.3 ng/mL (ref <0.30)

## 2013-05-15 LAB — URINE MICROSCOPIC-ADD ON

## 2013-05-15 LAB — PRO B NATRIURETIC PEPTIDE: Pro B Natriuretic peptide (BNP): 13764 pg/mL — ABNORMAL HIGH (ref 0–125)

## 2013-05-15 MED ORDER — HYDROXYZINE HCL 10 MG PO TABS
10.0000 mg | ORAL_TABLET | Freq: Every day | ORAL | Status: DC
  Administered 2013-05-15 – 2013-05-30 (×15): 10 mg via ORAL
  Filled 2013-05-15 (×20): qty 1

## 2013-05-15 MED ORDER — FUROSEMIDE 10 MG/ML IJ SOLN: 80.0000 mg | Freq: Once | INTRAMUSCULAR | Status: DC

## 2013-05-15 MED ORDER — ONDANSETRON HCL 4 MG/2ML IJ SOLN
4.0000 mg | Freq: Four times a day (QID) | INTRAMUSCULAR | Status: DC | PRN
  Administered 2013-05-23: 4 mg via INTRAVENOUS
  Filled 2013-05-15 (×2): qty 2

## 2013-05-15 MED ORDER — METOPROLOL SUCCINATE ER 50 MG PO TB24
50.0000 mg | ORAL_TABLET | Freq: Every day | ORAL | Status: DC
  Administered 2013-05-15 – 2013-05-22 (×7): 50 mg via ORAL
  Filled 2013-05-15 (×10): qty 1

## 2013-05-15 MED ORDER — ENOXAPARIN SODIUM 40 MG/0.4ML ~~LOC~~ SOLN
40.0000 mg | SUBCUTANEOUS | Status: DC
  Administered 2013-05-15: 40 mg via SUBCUTANEOUS
  Filled 2013-05-15: qty 0.4

## 2013-05-15 MED ORDER — PAROXETINE HCL 30 MG PO TABS
60.0000 mg | ORAL_TABLET | Freq: Every day | ORAL | Status: DC
  Administered 2013-05-15 – 2013-05-31 (×16): 60 mg via ORAL
  Filled 2013-05-15 (×17): qty 2

## 2013-05-15 MED ORDER — SODIUM CHLORIDE 0.9 % IV SOLN: 250.0000 mL | INTRAVENOUS | Status: DC | PRN

## 2013-05-15 MED ORDER — WARFARIN SODIUM 5 MG PO TABS: 5.0000 mg | ORAL_TABLET | Freq: Every morning | ORAL | Status: DC

## 2013-05-15 MED ORDER — ENOXAPARIN SODIUM 80 MG/0.8ML ~~LOC~~ SOLN
70.0000 mg | Freq: Two times a day (BID) | SUBCUTANEOUS | Status: DC
  Administered 2013-05-15: 70 mg via SUBCUTANEOUS
  Filled 2013-05-15 (×4): qty 0.8

## 2013-05-15 MED ORDER — FUROSEMIDE 80 MG PO TABS
80.0000 mg | ORAL_TABLET | Freq: Every day | ORAL | Status: DC
  Administered 2013-05-16 – 2013-05-22 (×6): 80 mg via ORAL
  Filled 2013-05-15 (×9): qty 1

## 2013-05-15 MED ORDER — ACETAMINOPHEN 325 MG PO TABS
650.0000 mg | ORAL_TABLET | ORAL | Status: DC | PRN
  Administered 2013-05-15 – 2013-05-23 (×7): 650 mg via ORAL
  Filled 2013-05-15 (×7): qty 2

## 2013-05-15 MED ORDER — ASPIRIN EC 325 MG PO TBEC
325.0000 mg | DELAYED_RELEASE_TABLET | Freq: Every day | ORAL | Status: DC
  Administered 2013-05-15 – 2013-05-23 (×8): 325 mg via ORAL
  Filled 2013-05-15 (×10): qty 1

## 2013-05-15 MED ORDER — FUROSEMIDE 10 MG/ML IJ SOLN
80.0000 mg | Freq: Once | INTRAMUSCULAR | Status: AC
  Administered 2013-05-15: 80 mg via INTRAVENOUS
  Filled 2013-05-15: qty 8

## 2013-05-15 MED ORDER — ENOXAPARIN SODIUM 40 MG/0.4ML ~~LOC~~ SOLN: 40.0000 mg | SUBCUTANEOUS | Status: DC

## 2013-05-15 MED ORDER — ASPIRIN 81 MG PO CHEW
324.0000 mg | CHEWABLE_TABLET | ORAL | Status: AC
  Administered 2013-05-16: 324 mg via ORAL
  Filled 2013-05-15: qty 4

## 2013-05-15 MED ORDER — SODIUM CHLORIDE 0.9 % IV SOLN
250.0000 mL | INTRAVENOUS | Status: DC | PRN
  Administered 2013-05-24: 13:00:00 via INTRAVENOUS

## 2013-05-15 MED ORDER — SODIUM CHLORIDE 0.9 % IJ SOLN: 3.0000 mL | INTRAMUSCULAR | Status: DC | PRN

## 2013-05-15 MED ORDER — LEVOTHYROXINE SODIUM 88 MCG PO TABS
88.0000 ug | ORAL_TABLET | Freq: Every day | ORAL | Status: DC
  Administered 2013-05-15: 88 ug via ORAL
  Filled 2013-05-15 (×3): qty 1

## 2013-05-15 MED ORDER — ACETAMINOPHEN 325 MG PO TABS
650.0000 mg | ORAL_TABLET | Freq: Once | ORAL | Status: AC
  Administered 2013-05-15: 650 mg via ORAL
  Filled 2013-05-15: qty 2

## 2013-05-15 MED ORDER — SODIUM CHLORIDE 0.9 % IJ SOLN
3.0000 mL | Freq: Two times a day (BID) | INTRAMUSCULAR | Status: DC
  Administered 2013-05-15 – 2013-05-23 (×15): 3 mL via INTRAVENOUS

## 2013-05-15 MED ORDER — POLYVINYL ALCOHOL 1.4 % OP SOLN
1.0000 [drp] | Freq: Four times a day (QID) | OPHTHALMIC | Status: DC | PRN
  Administered 2013-05-15 – 2013-05-16 (×2): 1 [drp] via OPHTHALMIC
  Filled 2013-05-15: qty 15

## 2013-05-15 MED ORDER — COUMADIN BOOK
Freq: Once | Status: AC
  Administered 2013-05-15: 13:00:00
  Filled 2013-05-15: qty 1

## 2013-05-15 MED ORDER — DIAZEPAM 2 MG PO TABS: 2.0000 mg | ORAL_TABLET | Freq: Three times a day (TID) | ORAL | Status: DC

## 2013-05-15 MED ORDER — POTASSIUM CHLORIDE CRYS ER 20 MEQ PO TBCR
40.0000 meq | EXTENDED_RELEASE_TABLET | Freq: Once | ORAL | Status: AC
  Administered 2013-05-15: 40 meq via ORAL
  Filled 2013-05-15 (×2): qty 2

## 2013-05-15 MED ORDER — WARFARIN - PHARMACIST DOSING INPATIENT: Freq: Every day | Status: DC

## 2013-05-15 MED ORDER — LISINOPRIL 20 MG PO TABS
20.0000 mg | ORAL_TABLET | Freq: Two times a day (BID) | ORAL | Status: DC
  Administered 2013-05-15 – 2013-05-23 (×15): 20 mg via ORAL
  Filled 2013-05-15 (×24): qty 1

## 2013-05-15 MED ORDER — WARFARIN VIDEO: Freq: Once | Status: DC

## 2013-05-15 MED ORDER — SODIUM CHLORIDE 0.9 % IJ SOLN
3.0000 mL | Freq: Two times a day (BID) | INTRAMUSCULAR | Status: DC
  Administered 2013-05-15: 3 mL via INTRAVENOUS

## 2013-05-15 MED ORDER — PANTOPRAZOLE SODIUM 40 MG PO TBEC
40.0000 mg | DELAYED_RELEASE_TABLET | Freq: Every day | ORAL | Status: DC
  Administered 2013-05-15 – 2013-05-23 (×9): 40 mg via ORAL
  Filled 2013-05-15 (×6): qty 1

## 2013-05-15 MED ORDER — WARFARIN SODIUM 5 MG PO TABS
5.0000 mg | ORAL_TABLET | Freq: Once | ORAL | Status: DC
  Filled 2013-05-15: qty 1

## 2013-05-15 MED ORDER — IOHEXOL 350 MG/ML SOLN
100.0000 mL | Freq: Once | INTRAVENOUS | Status: AC | PRN
  Administered 2013-05-15: 100 mL via INTRAVENOUS

## 2013-05-15 MED ORDER — SODIUM CHLORIDE 0.9 % IV SOLN
INTRAVENOUS | Status: DC
  Administered 2013-05-15: via INTRAVENOUS

## 2013-05-15 MED ORDER — POLYVINYL ALCOHOL-POVIDONE 5-6 MG/ML OP SOLN: Freq: Four times a day (QID) | OPHTHALMIC | Status: DC | PRN

## 2013-05-15 MED ORDER — DIAZEPAM 2 MG PO TABS
2.0000 mg | ORAL_TABLET | Freq: Three times a day (TID) | ORAL | Status: DC | PRN
  Administered 2013-05-15 (×3): 2 mg via ORAL
  Filled 2013-05-15 (×3): qty 1

## 2013-05-15 MED ORDER — DIAZEPAM 5 MG PO TABS
5.0000 mg | ORAL_TABLET | ORAL | Status: AC
  Administered 2013-05-16: 5 mg via ORAL
  Filled 2013-05-15: qty 1

## 2013-05-15 NOTE — ED Notes (Signed)
Back from CT, no changes, alert, NAD, calm, interactive.

## 2013-05-15 NOTE — Progress Notes (Signed)
05/15/13 1100 In to complete heart failure home health screen.  Pt. lives by herself, however her son helps her if needed.  She is able to ambulate without assistive devices and can take care of herself.  She does not want home health at this time.  She states she is able to afford her medications, and she stated she had all of her medications, except for a few that she had not refilled.  She is able to go to her physician appointments without difficulty. Llana Aliment, RN, BSN NCM [phone]

## 2013-05-15 NOTE — ED Notes (Signed)
Pts that she is unable to stand. Due to legs are very week.

## 2013-05-15 NOTE — ED Notes (Addendum)
Pt alert, NAD, calm, interactive, resps e/u, speaking in clear complete sentences, skin W&D, c/o some nausea and bilateral lower leg pain, 7/10. (Denies: sob, dizziness, other pains or other sx), VSS. No dyspnea. States, "unable to stand or walk d/t the pain, "think it is related to my heart". No cramping or tightness of muscles noted. No obvious leg restlessness noted.  CMS & ROM intact.

## 2013-05-15 NOTE — Progress Notes (Signed)
ANTICOAGULATION CONSULT NOTE - Initial Consult  Pharmacy Consult for Lovenox + Warfarin  Indication: atrial fibrillation  Allergies  Allergen Reactions  . Codeine Nausea And Vomiting  . Meperidine Hcl Nausea And Vomiting    Patient Measurements: Height: 5\' 4"  (162.6 cm) Weight: 155 lb 9.6 oz (70.58 kg) (scale a) IBW/kg (Calculated) : 54.7  Vital Signs: Temp: 98.5 F (36.9 C) (07/30 0752) Temp src: Oral (07/30 0752) BP: 144/75 mmHg (07/30 0752) Pulse Rate: 60 (07/30 0752)  Labs:  Recent Labs  05/14/13 2325 05/15/13 1050  HGB 9.7* 9.8*  HCT 28.8* 28.8*  PLT 236 265  LABPROT  --  12.9  INR  --  0.99  CREATININE 1.01  --   TROPONINI <0.30  --     Estimated Creatinine Clearance: 53.6 ml/min (by C-G formula based on Cr of 1.01).   Medical History: Past Medical History  Diagnosis Date  . Arthritis   . Hypertension   . A-fib   . High cholesterol   . Hypothyroidism   . Depression   . Shortness of breath     exertion  . Pacemaker     icd  . CHF (congestive heart failure)   . GERD (gastroesophageal reflux disease)   . [id number])     Assessment: 67 y.o F with PMH significant for Afib however only on ASA every other day PTA. CHADS2 score of 2 and admitting MD could not find documentation for why warfarin had not been started. Pharmacy was consulted to start lovenox bridge to therapeutic INR with warfarin. Baseline INR 0.99, SCr 1.01, CRCl~50-55 ml/min. Wt: 70 kg. Warf points~2  The patient received a dose of lovenox 40 mg for VTE px at 1000 this morning. Will schedule the next full dose to be given this evening. Per Dr. Sherral Hammers -- to continue ASA for now until INR >/= 2. Will plan to educate on warfarin prior to discharge if continued on this per cardiology.   Goal of Therapy:  INR 2-3 Anti-Xa level 0.6-1.2 units/ml 4hrs after LMWH dose given Monitor platelets by anticoagulation protocol: Yes   Plan:  1. Lovenox 70 mg SQ every 12 hours 2. Warfarin 5 mg x 1  dose at 1800 today 3. Warfarin book/video 4. Daily PT/INR, CBC every 3 days 5. Will continue to monitor for any signs/symptoms of bleeding and will follow up with PT/INR in the a.m.   Alycia Rossetti, PharmD, BCPS Clinical Pharmacist Pager: [phone] 05/15/2013 12:51 PM

## 2013-05-15 NOTE — ED Notes (Signed)
VSS, no dyspnea or respiritory distress, continuous persistent dry cough remains, given lasix, call bell in hand, bedpan at side in stretcher, verbalizes understanding of use. Tylenol given for HA and leg pain. Alert, NAD, calm, interactive, watching TV.

## 2013-05-15 NOTE — ED Notes (Signed)
test

## 2013-05-15 NOTE — H&P (Signed)
[facility] History and Physical  [name] [id number] DOB: January 28, 1946 DOA: 05/14/2013  Referring physician:  PCP: Ria Bush, MD  Specialists:   Chief Complaint: Increasing DOE, fatigue  HPI: [name] is a 67 y.o. WF PMHx CHF, mitral regurg, cardiac arrhythmia, atrial fibrillation, HTN, HLD, hypothyroidism. She was seen in the emergency Department 5 days ago with vomiting and diarrhea. Which she said resolved by Tuesday. States Over the last several days, she has noted dyspnea with exertion when she walks as little as 4 feet, positive fatigue, negative chest pain, negative firing of her defibrillator ([employer] single chamber ICD ), positive anorexia, slightly elevated temperature measured at 99.3, right lower extremity swelling, bilateral lower extremity coldness. There's been a cough which is intermittently productive of yellowish sputum. Nothing makes symptoms better nothing makes it worse. States she had a cardiac echo in 2013 but does not recall results states cardiac defibrillator placed 2010. Sees 3 cardiologist @  [facility] ( Dr Barbaraann Boys, Dr taylor, Dr Harrington Challenger)  Review of Systems: The patient denies anorexia, fever, weight loss,, vision loss, decreased hearing, hoarseness, chest pain, syncope,  peripheral edema, balance deficits, hemoptysis, abdominal pain, melena, hematochezia, severe indigestion/heartburn, hematuria, incontinence, genital sores, muscle weakness, suspicious skin lesions, transient blindness, difficulty walking, depression, unusual weight change, abnormal bleeding, enlarged lymph nodes, angioedema, and breast masses.      Procedure;   CT angiogram of chest PE protocol 05/15/2013 1. Technically adequate study without pulmonary embolism.  2. Cardiomegaly, pleural effusions, pulmonary edema are all  consistent with CHF.     Past Medical History  Diagnosis Date  . Arthritis   . Hypertension   . A-fib   . High cholesterol   . Hypothyroidism    . Depression   . Shortness of breath     exertion  . Pacemaker     icd  . CHF (congestive heart failure)   . GERD (gastroesophageal reflux disease)   . [id number])    Past Surgical History  Procedure Laterality Date  . Cardiac defibrillator placement    . Abdominal hysterectomy    . Hernia repair  as child    x 2  . Tee without cardioversion  09/24/2012    Procedure: TRANSESOPHAGEAL ECHOCARDIOGRAM (TEE);  Surgeon: Fay Records, MD;  Location: [facility] ENDOSCOPY;  Service: Cardiovascular;  Laterality: N/A;   Social History:  reports that she has never smoked. She has never used smokeless tobacco. She reports that she does not drink alcohol or use illicit drugs.   Allergies  Allergen Reactions  . Codeine Nausea And Vomiting  . Meperidine Hcl Nausea And Vomiting    History reviewed. No pertinent family history.   Prior to Admission medications   Medication Sig Start Date End Date Taking? Authorizing Provider  acetaminophen (TYLENOL) 500 MG tablet Take 1,000 mg by mouth every 6 (six) hours as needed. For pain   Yes Historical Provider, MD  aspirin EC 325 MG tablet Take 325 mg by mouth every other day.   Yes Historical Provider, MD  Aspirin-Acetaminophen-Caffeine (GOODY HEADACHE PO) Take 1 packet by mouth as needed. For pain   Yes Historical Provider, MD  diazepam (VALIUM) 2 MG tablet Take 2 mg by mouth 3 (three) times daily. For anxiety   Yes Historical Provider, MD  fish oil-omega-3 fatty acids 1000 MG capsule Take 1 g by mouth daily.   Yes Historical Provider, MD  furosemide (LASIX) 40 MG tablet Take 2 tablets (80 mg total) by mouth daily. 10/24/12  Yes Evans Lance, MD  hydrOXYzine (ATARAX/VISTARIL) 10 MG tablet Take 10 mg by mouth at bedtime.    Yes Historical Provider, MD  levothyroxine (SYNTHROID, LEVOTHROID) 88 MCG tablet Take 88 mcg by mouth daily.   Yes Historical Provider, MD  lisinopril (PRINIVIL,ZESTRIL) 10 MG tablet Take 10 mg by mouth 2 (two) times daily.   Yes  Historical Provider, MD  metoprolol succinate (TOPROL-XL) 50 MG 24 hr tablet Take 50 mg by mouth daily. Take with or immediately following a meal.   Yes Historical Provider, MD  pantoprazole (PROTONIX) 40 MG tablet Take 40 mg by mouth daily.   Yes Historical Provider, MD  PARoxetine (PAXIL) 30 MG tablet Take 60 mg by mouth daily.    Yes Historical Provider, MD  Polyvinyl Alcohol-Povidone (MURINE TEARS FOR DRY EYES OP) Place 1 drop into both eyes as needed. For dry eyes   Yes Historical Provider, MD  potassium chloride SA (KLOR-CON M20) 20 MEQ tablet Take 1 tablet (20 mEq total) by mouth daily. 07/03/12  Yes Evans Lance, MD  simvastatin (ZOCOR) 40 MG tablet Take 0.5 tablets (20 mg total) by mouth every evening. 09/20/12  Yes Evans Lance, MD  ondansetron (ZOFRAN) 4 MG tablet Take 1 tablet (4 mg total) by mouth every 6 (six) hours. 05/09/13   Rhunette Croft, MD   Physical Exam: Filed Vitals:   05/15/13 0615 05/15/13 0630 05/15/13 0645 05/15/13 0715  BP: 127/84 137/82 128/81 139/96  Pulse: 72 64 74 65  Temp:      TempSrc:      Resp:      SpO2: 99% 100% 99% 100%     General: Alert, NAD  Cardiovascular: Regular rhythm and rate, grade 4 holosystolic murmur, negative rubs or gallops, PD/TP pulses 2+ bilateral  Respiratory: Clear to auscultation bilaterally  Abdomen: Soft nontender nondistended plus bowel sounds    Labs on Admission:  Basic Metabolic Panel:  Recent Labs Lab 05/09/13 0720 05/14/13 2325  NA 139 136  K 4.0 4.8  CL 100 100  CO2 21 22  GLUCOSE 139* 101*  BUN 22 15  CREATININE 0.95 1.01  CALCIUM 9.6 9.2   Liver Function Tests:  Recent Labs Lab 05/09/13 0720 05/14/13 2325  AST 161* 69*  ALT 119* 78*  ALKPHOS 67 64  BILITOT 0.9 0.9  PROT 7.3 6.4  ALBUMIN 4.5 3.8    Recent Labs Lab 05/09/13 0720 05/14/13 2325  LIPASE 19 19   No results found for this basename: AMMONIA,  in the last 168 hours CBC:  Recent Labs Lab 05/09/13 0720 05/14/13 2325   WBC 6.8 5.8  NEUTROABS 5.9 4.5  HGB 9.6* 9.7*  HCT 29.0* 28.8*  MCV 98.0 98.6  PLT 230 236   Cardiac Enzymes:  Recent Labs Lab 05/14/13 2325  TROPONINI <0.30    BNP (last 3 results)  Recent Labs  05/15/13 0200  PROBNP 13764.0*   CBG: No results found for this basename: GLUCAP,  in the last 168 hours  Radiological Exams on Admission: Dg Chest 2 View  05/14/2013   *RADIOLOGY REPORT*  Clinical Data: Weakness, shortness of breath and cough.  CHEST - 2 VIEW  Comparison: 06/28/2012  Findings: Implanted cardiac defibrillator shows stable appearance. There is stable cardiomegaly without evidence of pulmonary edema. No infiltrates or pleural fluid are identified.  The bony thorax shows osteopenia and probable mild loss of height of some of the mid thoracic vertebral bodies which is stable.  IMPRESSION: No active disease.  Stable cardiomegaly and appearance of defibrillator.   Original Report Authenticated By: Aletta Edouard, M.D.   Ct Angio Chest Pe W/cm &/or Wo Cm  05/15/2013   *RADIOLOGY REPORT*  Clinical Data: Short of breath.  Dyspnea.  Chest pain.  Elevated D- dimer.  CT ANGIOGRAPHY CHEST  Technique:  Multidetector CT imaging of the chest using the standard protocol during bolus administration of intravenous contrast. Multiplanar reconstructed images including MIPs were obtained and reviewed to evaluate the vascular anatomy.  Contrast: 12mL OMNIPAQUE IOHEXOL 350 MG/ML SOLN  Comparison: Radiograph 05/14/2013.  Findings: Technically adequate study without pulmonary embolus. Cardiomegaly.  Small pericardial effusion.  Dense calcification of the tracheobronchial tree.  Small bilateral dependently layering pleural effusions.  Aortic atherosclerosis without aneurysm.  Small hiatal hernia.  There is diffuse ground-glass attenuation of the lungs.  This probably relates to a combination of expiratory phase imaging, areas of air trapping and interstitial pulmonary edema.  Interlobular apical  septal thickening is present, indicating interstitial pulmonary edema.  There is no axillary adenopathy.  No mediastinal or hilar adenopathy.  Enlargement of the pulmonary artery is probably due to vascular congestion and volume overload.  Exaggerated thoracic kyphosis.  No aggressive osseous lesions.  Right subclavian AICD.  IMPRESSION: 1.  Technically adequate study without pulmonary embolism. 2.  Cardiomegaly, pleural effusions, pulmonary edema are all consistent with CHF.   Original Report Authenticated By: Dereck Ligas, M.D.    EKG: Pending  Assessment/Plan Active Problems:   * No active hospital problems. *   1. CHF; patient unsure what her baseline weight is current weight is 158.1 pounds, diuresis patient. We'll review patient's past history to determine what her baseline weight should be. Strict ins and outs. Patient will obtain 2-D cardiac echo 2. Atrial fibrillation; currently rate controlled. Start patient on Coumadin. We'll also contact [facility] and let them know that we have a patient there is present 3.  HLD we'll obtain lipid panel and treat accordingly 4. Hypothyroidism we'll obtain TSH/free T4 level   Code Status: Full Family Communication:  Disposition Plan:   Time spent: 60 minutes  Allie Bossier [facility] Pager [phone]  If 7PM-7AM, please contact night-coverage [email or web address] Password [facility] 05/15/2013, 7:27 AM

## 2013-05-15 NOTE — Consult Note (Signed)
Reason for Consult: Mitral valve prolapse with severe mitral regurgitation Referring Physician: Dr. Mellissa Kohut Grandpre is an 67 y.o. female.  HPI: Patient is 67 year old female with past medical history significant for hypertension, history of V. fib cardiac arrest in November of 2010 status post single-chamber ICD history of recurrent congestive heart failure secondary to valvular heart disease, hypercholesteremia, hypothyroidism, irritable bowel syndrome, history of scoliosis, GERD, was admitted because of progressive increasing shortness of breath for last 2 weeks. Patient states she gets shortness of breath with minimal exertion. Also gives history of PND orthopnea and L. neck swelling. Patient denies any palpitation lightheadedness or syncopal episode. Denies any chest pain. Patient had TEE done in December of 2013 which showed a fail posterior mitral leaflet with severe mitral regurgitation and today also had 2-D echo done which showed similar findings.  Past Medical History  Diagnosis Date  . Arthritis   . Hypertension   . A-fib   . High cholesterol   . Hypothyroidism   . Depression   . Shortness of breath     exertion  . Pacemaker     icd  . CHF (congestive heart failure)   . GERD (gastroesophageal reflux disease)   . [id number])     Past Surgical History  Procedure Laterality Date  . Cardiac defibrillator placement    . Abdominal hysterectomy    . Hernia repair  as child    x 2  . Tee without cardioversion  09/24/2012    Procedure: TRANSESOPHAGEAL ECHOCARDIOGRAM (TEE);  Surgeon: Fay Records, MD;  Location: [facility] ENDOSCOPY;  Service: Cardiovascular;  Laterality: N/A;    History reviewed. No pertinent family history.  Social History:  reports that she has never smoked. She has never used smokeless tobacco. She reports that she does not drink alcohol or use illicit drugs.  Allergies:  Allergies  Allergen Reactions  . Codeine Nausea And Vomiting  . Meperidine Hcl  Nausea And Vomiting    Medications: I have reviewed the patient's current medications.  Results for orders placed during the hospital encounter of 05/14/13 (from the past 48 hour(s))  CBC WITH DIFFERENTIAL     Status: Abnormal   Collection Time    05/14/13 11:25 PM      Result Value Range   WBC 5.8  4.0 - 10.5 K/uL   RBC 2.92 (*) 3.87 - 5.11 MIL/uL   Hemoglobin 9.7 (*) 12.0 - 15.0 g/dL   HCT 28.8 (*) 36.0 - 46.0 %   MCV 98.6  78.0 - 100.0 fL   MCH 33.2  26.0 - 34.0 pg   MCHC 33.7  30.0 - 36.0 g/dL   RDW 15.6 (*) 11.5 - 15.5 %   Platelets 236  150 - 400 K/uL   Neutrophils Relative % 77  43 - 77 %   Neutro Abs 4.5  1.7 - 7.7 K/uL   Lymphocytes Relative 16  12 - 46 %   Lymphs Abs 1.0  0.7 - 4.0 K/uL   Monocytes Relative 6  3 - 12 %   Monocytes Absolute 0.3  0.1 - 1.0 K/uL   Eosinophils Relative 1  0 - 5 %   Eosinophils Absolute 0.1  0.0 - 0.7 K/uL   Basophils Relative 0  0 - 1 %   Basophils Absolute 0.0  0.0 - 0.1 K/uL  TROPONIN I     Status: None   Collection Time    05/14/13 11:25 PM      Result Value Range  Troponin I <0.30  <0.30 ng/mL   Comment:            Due to the release kinetics of cTnI,     a negative result within the first hours     of the onset of symptoms does not rule out     myocardial infarction with certainty.     If myocardial infarction is still suspected,     repeat the test at appropriate intervals.  COMPREHENSIVE METABOLIC PANEL     Status: Abnormal   Collection Time    05/14/13 11:25 PM      Result Value Range   Sodium 136  135 - 145 mEq/L   Potassium 4.8  3.5 - 5.1 mEq/L   Chloride 100  96 - 112 mEq/L   CO2 22  19 - 32 mEq/L   Glucose, Bld 101 (*) 70 - 99 mg/dL   BUN 15  6 - 23 mg/dL   Creatinine, Ser 1.01  0.50 - 1.10 mg/dL   Calcium 9.2  8.4 - 10.5 mg/dL   Total Protein 6.4  6.0 - 8.3 g/dL   Albumin 3.8  3.5 - 5.2 g/dL   AST 69 (*) 0 - 37 U/L   ALT 78 (*) 0 - 35 U/L   Alkaline Phosphatase 64  39 - 117 U/L   Total Bilirubin 0.9  0.3 -  1.2 mg/dL   GFR calc non Af Amer 57 (*) >90 mL/min   GFR calc Af Amer 66 (*) >90 mL/min   Comment:            The eGFR has been calculated     using the CKD EPI equation.     This calculation has not been     validated in all clinical     situations.     eGFR's persistently     <90 mL/min signify     possible Chronic Kidney Disease.  LIPASE, BLOOD     Status: None   Collection Time    05/14/13 11:25 PM      Result Value Range   Lipase 19  11 - 59 U/L  URINALYSIS, ROUTINE W REFLEX MICROSCOPIC     Status: Abnormal   Collection Time    05/15/13 12:01 AM      Result Value Range   Color, Urine AMBER (*) YELLOW   Comment: BIOCHEMICALS MAY BE AFFECTED BY COLOR   APPearance CLEAR  CLEAR   Specific Gravity, Urine 1.020  1.005 - 1.030   pH 6.0  5.0 - 8.0   Glucose, UA NEGATIVE  NEGATIVE mg/dL   Hgb urine dipstick TRACE (*) NEGATIVE   Bilirubin Urine SMALL (*) NEGATIVE   Ketones, ur 15 (*) NEGATIVE mg/dL   Protein, ur >300 (*) NEGATIVE mg/dL   Urobilinogen, UA 1.0  0.0 - 1.0 mg/dL   Nitrite NEGATIVE  NEGATIVE   Leukocytes, UA SMALL (*) NEGATIVE  URINE MICROSCOPIC-ADD ON     Status: Abnormal   Collection Time    05/15/13 12:01 AM      Result Value Range   Squamous Epithelial / LPF FEW (*) RARE   WBC, UA 3-6  <3 WBC/hpf   RBC / HPF 0-2  <3 RBC/hpf   Bacteria, UA RARE  RARE  D-DIMER, QUANTITATIVE     Status: Abnormal   Collection Time    05/15/13  1:36 AM      Result Value Range   D-Dimer, Quant 1.34 (*) 0.00 - 0.48 ug/mL-FEU  Comment:            AT THE INHOUSE ESTABLISHED CUTOFF     VALUE OF 0.48 ug/mL FEU,     THIS ASSAY HAS BEEN DOCUMENTED     IN THE LITERATURE TO HAVE     A SENSITIVITY AND NEGATIVE     PREDICTIVE VALUE OF AT LEAST     98 TO 99%.  THE TEST RESULT     SHOULD BE CORRELATED WITH     AN ASSESSMENT OF THE CLINICAL     PROBABILITY OF DVT / VTE.  PRO B NATRIURETIC PEPTIDE     Status: Abnormal   Collection Time    05/15/13  2:00 AM      Result Value Range    Pro B Natriuretic peptide (BNP) 13764.0 (*) 0 - 125 pg/mL  COMPREHENSIVE METABOLIC PANEL     Status: Abnormal   Collection Time    05/15/13 10:50 AM      Result Value Range   Sodium 136  135 - 145 mEq/L   Potassium 3.9  3.5 - 5.1 mEq/L   Chloride 96  96 - 112 mEq/L   CO2 25  19 - 32 mEq/L   Glucose, Bld 102 (*) 70 - 99 mg/dL   BUN 17  6 - 23 mg/dL   Creatinine, Ser 1.15 (*) 0.50 - 1.10 mg/dL   Calcium 9.5  8.4 - 10.5 mg/dL   Total Protein 6.6  6.0 - 8.3 g/dL   Albumin 4.1  3.5 - 5.2 g/dL   AST 75 (*) 0 - 37 U/L   ALT 83 (*) 0 - 35 U/L   Alkaline Phosphatase 67  39 - 117 U/L   Total Bilirubin 1.1  0.3 - 1.2 mg/dL   GFR calc non Af Amer 49 (*) >90 mL/min   GFR calc Af Amer 57 (*) >90 mL/min   Comment:            The eGFR has been calculated     using the CKD EPI equation.     This calculation has not been     validated in all clinical     situations.     eGFR's persistently     <90 mL/min signify     possible Chronic Kidney Disease.  CBC WITH DIFFERENTIAL     Status: Abnormal   Collection Time    05/15/13 10:50 AM      Result Value Range   WBC 7.0  4.0 - 10.5 K/uL   RBC 2.94 (*) 3.87 - 5.11 MIL/uL   Hemoglobin 9.8 (*) 12.0 - 15.0 g/dL   HCT 28.8 (*) 36.0 - 46.0 %   MCV 98.0  78.0 - 100.0 fL   MCH 33.3  26.0 - 34.0 pg   MCHC 34.0  30.0 - 36.0 g/dL   RDW 15.6 (*) 11.5 - 15.5 %   Platelets 265  150 - 400 K/uL   Neutrophils Relative % 71  43 - 77 %   Neutro Abs 5.0  1.7 - 7.7 K/uL   Lymphocytes Relative 18  12 - 46 %   Lymphs Abs 1.3  0.7 - 4.0 K/uL   Monocytes Relative 9  3 - 12 %   Monocytes Absolute 0.6  0.1 - 1.0 K/uL   Eosinophils Relative 2  0 - 5 %   Eosinophils Absolute 0.1  0.0 - 0.7 K/uL   Basophils Relative 0  0 - 1 %   Basophils Absolute 0.0  0.0 -  0.1 K/uL  PROTIME-INR     Status: None   Collection Time    05/15/13 10:50 AM      Result Value Range   Prothrombin Time 12.9  11.6 - 15.2 seconds   INR 0.99  0.00 - 1.49  MAGNESIUM     Status: None    Collection Time    05/15/13 10:50 AM      Result Value Range   Magnesium 2.4  1.5 - 2.5 mg/dL  TROPONIN I     Status: None   Collection Time    05/15/13 10:50 AM      Result Value Range   Troponin I <0.30  <0.30 ng/mL   Comment:            Due to the release kinetics of cTnI,     a negative result within the first hours     of the onset of symptoms does not rule out     myocardial infarction with certainty.     If myocardial infarction is still suspected,     repeat the test at appropriate intervals.  TROPONIN I     Status: None   Collection Time    05/15/13  3:02 PM      Result Value Range   Troponin I <0.30  <0.30 ng/mL   Comment:            Due to the release kinetics of cTnI,     a negative result within the first hours     of the onset of symptoms does not rule out     myocardial infarction with certainty.     If myocardial infarction is still suspected,     repeat the test at appropriate intervals.  LIPID PANEL     Status: Abnormal   Collection Time    05/15/13  3:02 PM      Result Value Range   Cholesterol 156  0 - 200 mg/dL   Triglycerides 140  <150 mg/dL   HDL 38 (*) >39 mg/dL   Total CHOL/HDL Ratio 4.1     VLDL 28  0 - 40 mg/dL   LDL Cholesterol 90  0 - 99 mg/dL   Comment:            Total Cholesterol/HDL:CHD Risk     Coronary Heart Disease Risk Table                         Men   Women      1/2 Average Risk   3.4   3.3      Average Risk       5.0   4.4      2 X Average Risk   9.6   7.1      3 X Average Risk  23.4   11.0                Use the calculated Patient Ratio     above and the CHD Risk Table     to determine the patient's CHD Risk.                ATP III CLASSIFICATION (LDL):      <100     mg/dL   Optimal      100-129  mg/dL   Near or Above  Optimal      130-159  mg/dL   Borderline      160-189  mg/dL   High      >190     mg/dL   Very High    Dg Chest 2 View  05/14/2013   *RADIOLOGY REPORT*  Clinical Data: Weakness,  shortness of breath and cough.  CHEST - 2 VIEW  Comparison: 06/28/2012  Findings: Implanted cardiac defibrillator shows stable appearance. There is stable cardiomegaly without evidence of pulmonary edema. No infiltrates or pleural fluid are identified.  The bony thorax shows osteopenia and probable mild loss of height of some of the mid thoracic vertebral bodies which is stable.  IMPRESSION: No active disease.  Stable cardiomegaly and appearance of defibrillator.   Original Report Authenticated By: Aletta Edouard, M.D.   Ct Angio Chest Pe W/cm &/or Wo Cm  05/15/2013   *RADIOLOGY REPORT*  Clinical Data: Short of breath.  Dyspnea.  Chest pain.  Elevated D- dimer.  CT ANGIOGRAPHY CHEST  Technique:  Multidetector CT imaging of the chest using the standard protocol during bolus administration of intravenous contrast. Multiplanar reconstructed images including MIPs were obtained and reviewed to evaluate the vascular anatomy.  Contrast: 169mL OMNIPAQUE IOHEXOL 350 MG/ML SOLN  Comparison: Radiograph 05/14/2013.  Findings: Technically adequate study without pulmonary embolus. Cardiomegaly.  Small pericardial effusion.  Dense calcification of the tracheobronchial tree.  Small bilateral dependently layering pleural effusions.  Aortic atherosclerosis without aneurysm.  Small hiatal hernia.  There is diffuse ground-glass attenuation of the lungs.  This probably relates to a combination of expiratory phase imaging, areas of air trapping and interstitial pulmonary edema.  Interlobular apical septal thickening is present, indicating interstitial pulmonary edema.  There is no axillary adenopathy.  No mediastinal or hilar adenopathy.  Enlargement of the pulmonary artery is probably due to vascular congestion and volume overload.  Exaggerated thoracic kyphosis.  No aggressive osseous lesions.  Right subclavian AICD.  IMPRESSION: 1.  Technically adequate study without pulmonary embolism. 2.  Cardiomegaly, pleural effusions,  pulmonary edema are all consistent with CHF.   Original Report Authenticated By: Dereck Ligas, M.D.    Review of Systems  Constitutional: Negative for weight loss.  Eyes: Negative for blurred vision and double vision.  Respiratory: Positive for cough and shortness of breath. Negative for hemoptysis and wheezing.   Cardiovascular: Positive for orthopnea, leg swelling and PND. Negative for chest pain, palpitations and claudication.  Gastrointestinal: Positive for nausea. Negative for abdominal pain and diarrhea.  Genitourinary: Negative for dysuria.  Neurological: Negative for dizziness and weakness.   Blood pressure 134/84, pulse 62, temperature 98.1 F (36.7 C), temperature source Oral, resp. rate 20, height 5\' 4"  (1.626 m), weight 70.58 kg (155 lb 9.6 oz), SpO2 100.00%. Physical Exam  Constitutional: She is oriented to person, place, and time.  HENT:  Head: Normocephalic and atraumatic.  Eyes: Conjunctivae are normal. Left eye exhibits no discharge. No scleral icterus.  Neck: Normal range of motion. Neck supple. No JVD present. No tracheal deviation present. No thyromegaly present.  Cardiovascular: Exam reveals no gallop.   Regular rate and rhythm S1-S2 normal there is 3/6 systolic murmur at apex radiating to axilla and systolic click noted  Respiratory: Effort normal and breath sounds normal. No respiratory distress. She has no wheezes. She has no rales.  Musculoskeletal: She exhibits no edema and no tenderness.  Neurological: She is alert and oriented to person, place, and time.    Assessment/Plan: Symptomatic severe mitral regurgitation with flail posterior  mitral leaflet History of V. fib cardiac arrest status post single-chamber ICD in the past Hypertension Compensated congestive heart failure secondary to valvular heart disease as above Hypercholesteremia Hypothyroidism Irritable bowel syndrome Chronic anemia Scoliosis GERD Plan Discussed with patient regarding left  cardiac cath and possible MVR is risk and benefits i.e. death MI stroke  local vascular complications etc. and consents for left cath. We'll get CVTS consult for possible MVR  Mari Battaglia N 05/15/2013, 5:24 PM

## 2013-05-15 NOTE — Progress Notes (Signed)
  Echocardiogram 2D Echocardiogram has been performed.  [name] 05/15/2013, 10:36 AM

## 2013-05-15 NOTE — ED Notes (Signed)
Pt states she her legs are to weak and unable to walk.

## 2013-05-15 NOTE — Plan of Care (Signed)
Problem: Phase I Progression Outcomes Goal: EF % per last Echo/documented,Core Reminder form on chart Outcome: Completed/Met Date Met:  05/15/13 Echo performed today

## 2013-05-16 ENCOUNTER — Other Ambulatory Visit: Payer: Self-pay

## 2013-05-16 ENCOUNTER — Encounter (HOSPITAL_COMMUNITY)
Admission: EM | Disposition: A | Discharge status: Home Health | Payer: Self-pay | Source: Home / Self Care | Attending: Cardiothoracic Surgery

## 2013-05-16 ENCOUNTER — Inpatient Hospital Stay (HOSPITAL_COMMUNITY): Payer: Medicare Other

## 2013-05-16 DIAGNOSIS — I059 Rheumatic mitral valve disease, unspecified: Secondary | ICD-10-CM

## 2013-05-16 DIAGNOSIS — I10 Essential (primary) hypertension: Secondary | ICD-10-CM

## 2013-05-16 DIAGNOSIS — I5022 Chronic systolic (congestive) heart failure: Secondary | ICD-10-CM

## 2013-05-16 HISTORY — PX: LEFT HEART CATHETERIZATION WITH CORONARY ANGIOGRAM: SHX5451

## 2013-05-16 LAB — URINALYSIS, ROUTINE W REFLEX MICROSCOPIC
Bilirubin Urine: NEGATIVE
Glucose, UA: NEGATIVE mg/dL
Hgb urine dipstick: NEGATIVE
Ketones, ur: NEGATIVE mg/dL
Leukocytes, UA: NEGATIVE
Nitrite: NEGATIVE
Protein, ur: 30 mg/dL — AB
Specific Gravity, Urine: 1.029 (ref 1.005–1.030)
Urobilinogen, UA: 1 mg/dL (ref 0.0–1.0)
pH: 6 (ref 5.0–8.0)

## 2013-05-16 LAB — COMPREHENSIVE METABOLIC PANEL
AST: 59 U/L — ABNORMAL HIGH (ref 0–37)
BUN: 23 mg/dL (ref 6–23)
CO2: 22 mEq/L (ref 19–32)
Chloride: 99 mEq/L (ref 96–112)
Creatinine, Ser: 1.18 mg/dL — ABNORMAL HIGH (ref 0.50–1.10)
GFR calc Af Amer: 55 mL/min — ABNORMAL LOW (ref >90)
GFR calc non Af Amer: 47 mL/min — ABNORMAL LOW (ref >90)
Glucose, Bld: 108 mg/dL — ABNORMAL HIGH (ref 70–99)
Total Bilirubin: 0.9 mg/dL (ref 0.3–1.2)

## 2013-05-16 LAB — PROTIME-INR
INR: 1.27 (ref 0.00–1.49)
Prothrombin Time: 15.6 seconds — ABNORMAL HIGH (ref 11.6–15.2)

## 2013-05-16 LAB — SURGICAL PCR SCREEN
MRSA, PCR: NEGATIVE
Staphylococcus aureus: POSITIVE — AB

## 2013-05-16 LAB — URINE MICROSCOPIC-ADD ON

## 2013-05-16 SURGERY — LEFT HEART CATHETERIZATION WITH CORONARY ANGIOGRAM
Anesthesia: LOCAL

## 2013-05-16 MED ORDER — FENTANYL CITRATE 0.05 MG/ML IJ SOLN
INTRAMUSCULAR | Status: AC
  Filled 2013-05-16: qty 2

## 2013-05-16 MED ORDER — LIDOCAINE HCL (PF) 1 % IJ SOLN
INTRAMUSCULAR | Status: AC
  Filled 2013-05-16: qty 30

## 2013-05-16 MED ORDER — ACETAMINOPHEN 325 MG PO TABS: 650.0000 mg | ORAL_TABLET | ORAL | Status: DC | PRN

## 2013-05-16 MED ORDER — ONDANSETRON HCL 4 MG/2ML IJ SOLN: 4.0000 mg | Freq: Four times a day (QID) | INTRAMUSCULAR | Status: DC | PRN

## 2013-05-16 MED ORDER — NITROGLYCERIN 0.2 MG/ML ON CALL CATH LAB
INTRAVENOUS | Status: AC
Start: 2013-05-16 — End: 2013-05-16
  Filled 2013-05-16: qty 1

## 2013-05-16 MED ORDER — LEVOTHYROXINE SODIUM 100 MCG PO TABS
100.0000 ug | ORAL_TABLET | Freq: Every day | ORAL | Status: DC
  Administered 2013-05-17 – 2013-05-31 (×13): 100 ug via ORAL
  Filled 2013-05-16 (×19): qty 1

## 2013-05-16 MED ORDER — GUAIFENESIN-DM 100-10 MG/5ML PO SYRP
5.0000 mL | ORAL_SOLUTION | ORAL | Status: DC | PRN
  Administered 2013-05-16 – 2013-05-23 (×8): 5 mL via ORAL
  Filled 2013-05-16 (×8): qty 5

## 2013-05-16 MED ORDER — FAMOTIDINE 20 MG PO TABS
20.0000 mg | ORAL_TABLET | Freq: Once | ORAL | Status: AC
  Administered 2013-05-16: 20 mg via ORAL
  Filled 2013-05-16: qty 1

## 2013-05-16 MED ORDER — MIDAZOLAM HCL 2 MG/2ML IJ SOLN
INTRAMUSCULAR | Status: AC
  Filled 2013-05-16: qty 2

## 2013-05-16 MED ORDER — SIMETHICONE 80 MG PO CHEW
160.0000 mg | CHEWABLE_TABLET | Freq: Once | ORAL | Status: AC | PRN
  Administered 2013-05-16: 160 mg via ORAL
  Filled 2013-05-16: qty 2

## 2013-05-16 MED ORDER — SODIUM CHLORIDE 0.9 % IV SOLN
INTRAVENOUS | Status: AC
  Administered 2013-05-16: 13:00:00 via INTRAVENOUS

## 2013-05-16 MED ORDER — HEPARIN (PORCINE) IN NACL 2-0.9 UNIT/ML-% IJ SOLN
INTRAMUSCULAR | Status: AC
  Filled 2013-05-16: qty 1000

## 2013-05-16 NOTE — Progress Notes (Signed)
[facility] Progress note  [name] [id number] DOB: 1946/01/10 DOA: 05/14/2013  Referring physician:  PCP: Ria Bush, MD  Specialists:   Chief Complaint: Increasing DOE, fatigue  HPI: [name] is a 67 y.o. WF PMHx CHF, mitral regurg, cardiac arrhythmia, atrial fibrillation, HTN, HLD, hypothyroidism. She was seen in the emergency Department 5 days ago with vomiting and diarrhea. Which she said resolved by Tuesday. States Over the last several days, she has noted dyspnea with exertion when she walks as little as 4 feet, positive fatigue, negative chest pain, negative firing of her defibrillator ([employer] single chamber ICD ), positive anorexia, slightly elevated temperature measured at 99.3, right lower extremity swelling, bilateral lower extremity coldness. There's been a cough which is intermittently productive of yellowish sputum. Nothing makes symptoms better nothing makes it worse. States she had a cardiac echo in 2013 but does not recall results states cardiac defibrillator placed 2010. Sees 3 cardiologist @  [facility] ( Dr Barbaraann Boys, Dr taylor, Dr Harrington Challenger). TODAY S/P day 2 catheterization by Dr. Charolette Forward (cardiology). Patient will require mitral valve repair /replacement and we're awaiting results of dictation to determine future course of care. Patient resting comfortably today with no complaints.   Procedure;   CT angiogram of chest PE protocol 05/15/2013 1. Technically adequate study without pulmonary embolism.  2. Cardiomegaly, pleural effusions, pulmonary edema are all  consistent with CHF.   Cardiac catheterization 05/15/2013    Past Medical History  Diagnosis Date  . Arthritis   . Hypertension   . A-fib   . High cholesterol   . Hypothyroidism   . Depression   . Shortness of breath     exertion  . Pacemaker     icd  . CHF (congestive heart failure)   . GERD (gastroesophageal reflux disease)   . [id number])    Past Surgical History   Procedure Laterality Date  . Cardiac defibrillator placement    . Abdominal hysterectomy    . Hernia repair  as child    x 2  . Tee without cardioversion  09/24/2012    Procedure: TRANSESOPHAGEAL ECHOCARDIOGRAM (TEE);  Surgeon: Fay Records, MD;  Location: [facility]COPY;  Service: Cardiovascular;  Laterality: N/A;   Social History:  reports that she has never smoked. She has never used smokeless tobacco. She reports that she does not drink alcohol or use illicit drugs.   Allergies  Allergen Reactions  . Codeine Nausea And Vomiting  . Meperidine Hcl Nausea And Vomiting    History reviewed. No pertinent family history.   Prior to Admission medications   Medication Sig Start Date End Date Taking? Authorizing Provider  acetaminophen (TYLENOL) 500 MG tablet Take 1,000 mg by mouth every 6 (six) hours as needed. For pain   Yes Historical Provider, MD  aspirin EC 325 MG tablet Take 325 mg by mouth every other day.   Yes Historical Provider, MD  Aspirin-Acetaminophen-Caffeine (GOODY HEADACHE PO) Take 1 packet by mouth as needed. For pain   Yes Historical Provider, MD  diazepam (VALIUM) 2 MG tablet Take 2 mg by mouth 3 (three) times daily. For anxiety   Yes Historical Provider, MD  fish oil-omega-3 fatty acids 1000 MG capsule Take 1 g by mouth daily.   Yes Historical Provider, MD  furosemide (LASIX) 40 MG tablet Take 2 tablets (80 mg total) by mouth daily. 10/24/12  Yes Evans Lance, MD  hydrOXYzine (ATARAX/VISTARIL) 10 MG tablet Take 10 mg by mouth at bedtime.  Yes Historical Provider, MD  levothyroxine (SYNTHROID, LEVOTHROID) 88 MCG tablet Take 88 mcg by mouth daily.   Yes Historical Provider, MD  lisinopril (PRINIVIL,ZESTRIL) 10 MG tablet Take 10 mg by mouth 2 (two) times daily.   Yes Historical Provider, MD  metoprolol succinate (TOPROL-XL) 50 MG 24 hr tablet Take 50 mg by mouth daily. Take with or immediately following a meal.   Yes Historical Provider, MD  pantoprazole (PROTONIX) 40 MG  tablet Take 40 mg by mouth daily.   Yes Historical Provider, MD  PARoxetine (PAXIL) 30 MG tablet Take 60 mg by mouth daily.    Yes Historical Provider, MD  Polyvinyl Alcohol-Povidone (MURINE TEARS FOR DRY EYES OP) Place 1 drop into both eyes as needed. For dry eyes   Yes Historical Provider, MD  potassium chloride SA (KLOR-CON M20) 20 MEQ tablet Take 1 tablet (20 mEq total) by mouth daily. 07/03/12  Yes Evans Lance, MD  simvastatin (ZOCOR) 40 MG tablet Take 0.5 tablets (20 mg total) by mouth every evening. 09/20/12  Yes Evans Lance, MD  ondansetron (ZOFRAN) 4 MG tablet Take 1 tablet (4 mg total) by mouth every 6 (six) hours. 05/09/13   Rhunette Croft, MD   Physical Exam: Filed Vitals:   05/16/13 1200 05/16/13 1329 05/16/13 1330 05/16/13 1356  BP: 128/66 132/69 132/69 132/116  Pulse: 68 71 66 68  Temp:    97.4 F (36.3 C)  TempSrc:    Oral  Resp:    18  Height:      Weight:      SpO2:    96%     General: Alert, NAD  Cardiovascular: Regular rhythm and rate, grade 4 holosystolic murmur, negative rubs or gallops, PD/TP pulses 2+ bilateral  Respiratory: Clear to auscultation bilaterally  Abdomen: Soft nontender nondistended plus bowel sounds  Muscle skeletal; right inguinal catheterization incision covered, dry, no signs of infection    Labs on Admission:  Basic Metabolic Panel:  Recent Labs Lab 05/14/13 2325 05/15/13 1050 05/16/13 0540  NA 136 136 135  K 4.8 3.9 4.3  CL 100 96 99  CO2 22 25 22   GLUCOSE 101* 102* 108*  BUN 15 17 23   CREATININE 1.01 1.15* 1.18*  CALCIUM 9.2 9.5 9.2  MG  --  2.4  --    Liver Function Tests:  Recent Labs Lab 05/14/13 2325 05/15/13 1050 05/16/13 0540  AST 69* 75* 59*  ALT 78* 83* 70*  ALKPHOS 64 67 68  BILITOT 0.9 1.1 0.9  PROT 6.4 6.6 6.4  ALBUMIN 3.8 4.1 4.0    Recent Labs Lab 05/14/13 2325  LIPASE 19   No results found for this basename: AMMONIA,  in the last 168 hours CBC:  Recent Labs Lab 05/14/13 2325  05/15/13 1050  WBC 5.8 7.0  NEUTROABS 4.5 5.0  HGB 9.7* 9.8*  HCT 28.8* 28.8*  MCV 98.6 98.0  PLT 236 265   Cardiac Enzymes:  Recent Labs Lab 05/14/13 2325 05/15/13 1050 05/15/13 1502 05/15/13 2033  TROPONINI <0.30 <0.30 <0.30 <0.30    BNP (last 3 results)  Recent Labs  05/15/13 0200 05/16/13 0540  PROBNP 13764.0* 15859.0*   CBG: No results found for this basename: GLUCAP,  in the last 168 hours  Radiological Exams on Admission: Dg Chest 2 View  05/14/2013   *RADIOLOGY REPORT*  Clinical Data: Weakness, shortness of breath and cough.  CHEST - 2 VIEW  Comparison: 06/28/2012  Findings: Implanted cardiac defibrillator shows stable appearance. There is  stable cardiomegaly without evidence of pulmonary edema. No infiltrates or pleural fluid are identified.  The bony thorax shows osteopenia and probable mild loss of height of some of the mid thoracic vertebral bodies which is stable.  IMPRESSION: No active disease.  Stable cardiomegaly and appearance of defibrillator.   Original Report Authenticated By: Aletta Edouard, M.D.   Ct Angio Chest Pe W/cm &/or Wo Cm  05/15/2013   *RADIOLOGY REPORT*  Clinical Data: Short of breath.  Dyspnea.  Chest pain.  Elevated D- dimer.  CT ANGIOGRAPHY CHEST  Technique:  Multidetector CT imaging of the chest using the standard protocol during bolus administration of intravenous contrast. Multiplanar reconstructed images including MIPs were obtained and reviewed to evaluate the vascular anatomy.  Contrast: 170mL OMNIPAQUE IOHEXOL 350 MG/ML SOLN  Comparison: Radiograph 05/14/2013.  Findings: Technically adequate study without pulmonary embolus. Cardiomegaly.  Small pericardial effusion.  Dense calcification of the tracheobronchial tree.  Small bilateral dependently layering pleural effusions.  Aortic atherosclerosis without aneurysm.  Small hiatal hernia.  There is diffuse ground-glass attenuation of the lungs.  This probably relates to a combination of  expiratory phase imaging, areas of air trapping and interstitial pulmonary edema.  Interlobular apical septal thickening is present, indicating interstitial pulmonary edema.  There is no axillary adenopathy.  No mediastinal or hilar adenopathy.  Enlargement of the pulmonary artery is probably due to vascular congestion and volume overload.  Exaggerated thoracic kyphosis.  No aggressive osseous lesions.  Right subclavian AICD.  IMPRESSION: 1.  Technically adequate study without pulmonary embolism. 2.  Cardiomegaly, pleural effusions, pulmonary edema are all consistent with CHF.   Original Report Authenticated By: Dereck Ligas, M.D.    EKG: Pending  Assessment/Plan Principal Problem:   Chronic systolic heart failure Active Problems:   HYPOTHYROIDISM   HYPERLIPIDEMIA   HYPERTENSION   CARDIAC ARRHYTHMIA   Mitral regurgitation   Anxiety state, unspecified   Atrial fibrillation   1. CHF; patient unsure what her baseline weight is current weight is 153 pounds,. We'll continue to diuresis patient. Continue Strict I/O and QD Weights. BNP= 15859 2. Atrial fibrillation; currently not in A-Fib. Continue to hold Coumadin 2dary to possible surgery. (Awaiting Medicare from cardiology) start patient on SCD for DVT  Prophylaxis 3.  HLD; patient's cholesterol level within NCEP guidelines 4. Hypothyroidism; TSH= 9.99(high), free T4= 1.55(NL), patient is therefore somewhat therapeutic hypothyroid will increase her Synthroid to 100 mcg daily 5. Mitral Valve Regurgitation; Dr Darcey Nora (CTSurgery) has been contacted by Dr.Mohan [facility] (cardiology), . And we'll see patient and determine the plan of care concerning repair/replacement of mitral   Code Status: Full Family Communication:  Disposition Plan: Per cardiology/CT surgery  Time spent: 60 minutes  WOODS, Geraldo Docker [facility] Pager [phone]  If 7PM-7AM, please contact night-coverage [email or web address] Password [facility] 05/16/2013, 4:17  PM

## 2013-05-16 NOTE — CV Procedure (Signed)
Left cath report date on 05/16/2013 dictation number is [phone]

## 2013-05-16 NOTE — Cardiovascular Report (Signed)
[name], [name]                ACCOUNT NO.:  [id number]  MEDICAL RECORD NO.:  [id number]  LOCATION:  [id number]                        FACILITY:  [facility]  PHYSICIAN:  Allegra Lai. Terrence Dupont, M.D. DATE OF BIRTH:  1945/10/18  DATE OF PROCEDURE:  05/16/2013 DATE OF DISCHARGE:                           CARDIAC CATHETERIZATION   PROCEDURE:  Left cardiac cath with selective left and right coronary angiography, left ventriculography via right groin using Judkins technique.  INDICATION FOR THE PROCEDURE:  [name] is a 67 year old female with past medical history significant for hypertension, history of VFib cardiac arrest in November, 2010, status post single-chamber ICD, history of recurrent congestive heart failure secondary to valvular heart disease, hypercholesterolemia, hypothyroidism, irritable bowel syndrome, history of scoliosis, GERD, was admitted because of progressive increasing shortness of breath for last 2 weeks.  The patient states she gets short of breath with minimal exertion and also gives history of PND, orthopnea, and leg swelling.  The patient denies any palpitation, lightheadedness, or syncopal episode.  Denies any chest pain.  The patient had TEE done in December, 2013 which showed flail posterior mitral leaflet with severe mitral regurgitation, and also had 2D echo yesterday which showed similar findings due to progressive exertional dyspnea and severe mitral regurgitation with flail leaflet. The patient is brought to the cath lab for preop evaluation for MVR to rule out any significant coronary artery disease.  Discussed with patient at length, its risks and benefits, i.e., death, MI, stroke, local vascular complications, etc., and consented for left cath.  DESCRIPTION OF PROCEDURE:  After obtaining the informed consent, patient was brought to the cath lab and was placed on fluoroscopy table.  Right groin was prepped and draped in usual fashion.  Xylocaine 1% was  used for local anesthesia in the right groin.  With the help of thin wall needle, a 6-French arterial sheath was placed.  The sheath was aspirated and flushed.  Next, 6-French left Judkins catheter was advanced over the wire under fluoroscopic guidance up to the ascending aorta.  Wire was pulled out.  The catheter was aspirated and connected to the Manifold. Catheter was further advanced and engaged into left coronary ostium. Multiple views of the left system were taken.  Next, catheter was disengaged and was pulled out over the wire and was replaced with 6- [place] right Judkins catheter, which was advanced over the wire under fluoroscopic guidance up to the ascending aorta.  Wire was pulled out. The catheter was aspirated and connected to the Manifold.  Catheter was further advanced and engaged into right coronary ostium.  Multiple views of the right system were taken.  Next, catheter was disengaged and was pulled out over the wire and was replaced with 6-French pigtail catheter, which was advanced over the wire under fluoroscopic guidance up to the ascending aorta.  Wire was pulled out.  The catheter was aspirated and connected to the Manifold.  Catheter was further advanced across the aortic valve into the LV.  LV pressures were recorded.  Next, LV graft was done in 30-degree RAO position.  Post-angiographic pressures were recorded from LV and then pullback pressures were recorded from the aorta.  There was no gradient  across the aortic valve. Next, the pigtail catheter was pulled out over the wire.  Sheaths were aspirated and flushed.  FINDINGS:  LV showed mild global hypokinesia, LVH, EF of 45-50%, there was 4+ MR, left main was long which was patent, LAD has 15-20% mid stenosis, diagonal 1 has 5-10% ostial stenosis, diagonal 2 is patent, ramus is patent, left circumflex has 5-10% proximal stenosis, RCA has 5- 10% proximal and mid stenosis, and PDA and PLV branches were  patent.  The patient tolerated procedure well.  There were no complications.  The patient was transferred to recovery room in stable condition.  PLAN:  CVTS consultation has already been called for possible MVR.     Allegra Lai. Terrence Dupont, M.D.     MNH/MEDQ  D:  05/16/2013  T:  05/16/2013  Job:  [id number]

## 2013-05-16 NOTE — Progress Notes (Signed)
Pt returned to room in stable condition, resting in bed comfortably with no complaints or concerns at this time.  Telemetry notified to resume. R groin dressing CDI, no drainage noted.  Call bell within reach.  Coolidge Breeze, RN 05/16/2013

## 2013-05-16 NOTE — Consult Note (Signed)
[address]       [place], [address]             [phone]        [name] Medical Record [id number] Date of Birth: July 24, 1946  Referring: No ref. provider found Primary Care: Ria Bush, MD  Chief Complaint:    Chief Complaint  Patient presents with  . Weakness   Patient examined, cardiac catheterization with coronary arteriograms and 2-D echocardiogram reviewed  History of Present Illness:     67 year old Caucasian female nonsmoker admitted with progressive symptoms of CHF now class IIIB. She has known mitral regurgitation and is been followed in the cardiology office. December of last year she had a transesophageal echo showing severe MR but she was to anxious to accept surgery. She now feels she is ready for surgery. She is status post AICD placement for an episode of ventricular arrhythmia several years ago. More recently she has had atrial arrhythmias but is currently in sinus rhythm. She is noted increased shortness of breath, increased edema, some orthopnea, but no PND.  Coronary tear grams today show no coronary disease. Right heart cath was not performed. 2-D echocardiogram shows a flail posterior leaflet with anterior jet of MR, fairly low preserved LV function, mild to moderate TR and no aortic valve disease.   Current Activity/ Functional Status: Patient lives alone not very active due to her heart failure   Zubrod Score: At the time of surgery this patient's most appropriate activity status/level should be described as: []  Normal activity, no symptoms []  Symptoms, fully ambulatory [x]  Symptoms, in bed less than or equal to 50% of the time []  Symptoms, in bed greater than 50% of the time but less than 100% []  Bedridden []  Moribund  Past Medical History  Diagnosis Date  . Arthritis   . Hypertension   . A-fib   . High cholesterol   . Hypothyroidism   . Depression   . Shortness of breath     exertion  .  Pacemaker     icd  . CHF (congestive heart failure)   . GERD (gastroesophageal reflux disease)   . [id number])     Past Surgical History  Procedure Laterality Date  . Cardiac defibrillator placement    . Abdominal hysterectomy    . Hernia repair  as child    x 2  . Tee without cardioversion  09/24/2012    Procedure: TRANSESOPHAGEAL ECHOCARDIOGRAM (TEE);  Surgeon: Fay Records, MD;  Location: [facility] ENDOSCOPY;  Service: Cardiovascular;  Laterality: N/A;    History  Smoking status  . Never Smoker   Smokeless tobacco  . Never Used    History  Alcohol Use No    History   Social History  . Marital Status: Widowed    Spouse Name: N/A    Number of Children: N/A  . Years of Education: N/A   Occupational History  . Not on file.   Social History Main Topics  . Smoking status: Never Smoker   . Smokeless tobacco: Never Used  . Alcohol Use: No  . Drug Use: No  . Sexually Active: Not on file   Other Topics Concern  . Not on file   Social History Narrative  . No narrative on file    Allergies  Allergen Reactions  . Codeine Nausea And Vomiting  . Meperidine Hcl Nausea And Vomiting    Current Facility-Administered Medications  Medication Dose Route Frequency Provider  Last Rate Last Dose  . 0.9 %  sodium chloride infusion  250 mL Intravenous PRN Allie Bossier, MD      . acetaminophen (TYLENOL) tablet 650 mg  650 mg Oral Q4H PRN Allie Bossier, MD   650 mg at 05/15/13 2333  . aspirin EC tablet 325 mg  325 mg Oral Daily Allie Bossier, MD   325 mg at 05/16/13 1329  . diazepam (VALIUM) tablet 2 mg  2 mg Oral TID PRN Allie Bossier, MD   2 mg at 05/15/13 2333  . furosemide (LASIX) injection 80 mg  80 mg Intravenous Once Allie Bossier, MD      . furosemide (LASIX) tablet 80 mg  80 mg Oral Daily Allie Bossier, MD   80 mg at 05/16/13 1329  . guaiFENesin-dextromethorphan (ROBITUSSIN DM) 100-10 MG/5ML syrup 5 mL  5 mL Oral Q4H PRN Allie Bossier, MD   5 mL at 05/16/13 0410    . hydrOXYzine (ATARAX/VISTARIL) tablet 10 mg  10 mg Oral QHS Allie Bossier, MD   10 mg at 05/15/13 2334  . [START ON 05/17/2013] levothyroxine (SYNTHROID, LEVOTHROID) tablet 100 mcg  100 mcg Oral QAC breakfast Allie Bossier, MD      . lisinopril (PRINIVIL,ZESTRIL) tablet 20 mg  20 mg Oral BID Allie Bossier, MD   20 mg at 05/16/13 1329  . metoprolol succinate (TOPROL-XL) 24 hr tablet 50 mg  50 mg Oral Daily Allie Bossier, MD   50 mg at 05/16/13 1330  . ondansetron (ZOFRAN) injection 4 mg  4 mg Intravenous Q6H PRN Allie Bossier, MD      . pantoprazole (PROTONIX) EC tablet 40 mg  40 mg Oral Daily Allie Bossier, MD   40 mg at 05/16/13 1330  . PARoxetine (PAXIL) tablet 60 mg  60 mg Oral Daily Allie Bossier, MD   60 mg at 05/16/13 1330  . polyvinyl alcohol (LIQUIFILM TEARS) 1.4 % ophthalmic solution 1 drop  1 drop Both Eyes QID PRN Allie Bossier, MD   1 drop at 05/16/13 1334  . sodium chloride 0.9 % injection 3 mL  3 mL Intravenous Q12H Allie Bossier, MD   3 mL at 05/16/13 1334  . sodium chloride 0.9 % injection 3 mL  3 mL Intravenous PRN Allie Bossier, MD      . sodium chloride 0.9 % injection 3 mL  3 mL Intravenous PRN Allie Bossier, MD        Prescriptions prior to admission  Medication Sig Dispense Refill  . acetaminophen (TYLENOL) 500 MG tablet Take 1,000 mg by mouth every 6 (six) hours as needed. For pain      . aspirin EC 325 MG tablet Take 325 mg by mouth every other day.      . Aspirin-Acetaminophen-Caffeine (GOODY HEADACHE PO) Take 1 packet by mouth as needed. For pain      . diazepam (VALIUM) 2 MG tablet Take 2 mg by mouth 3 (three) times daily. For anxiety      . fish oil-omega-3 fatty acids 1000 MG capsule Take 1 g by mouth daily.      . furosemide (LASIX) 40 MG tablet Take 2 tablets (80 mg total) by mouth daily.  180 tablet  2  . hydrOXYzine (ATARAX/VISTARIL) 10 MG tablet Take 10 mg by mouth at bedtime.       Marland Kitchen levothyroxine (SYNTHROID, LEVOTHROID) 88 MCG tablet Take 88 mcg  by  mouth daily.      Marland Kitchen lisinopril (PRINIVIL,ZESTRIL) 10 MG tablet Take 10 mg by mouth 2 (two) times daily.      . metoprolol succinate (TOPROL-XL) 50 MG 24 hr tablet Take 50 mg by mouth daily. Take with or immediately following a meal.      . pantoprazole (PROTONIX) 40 MG tablet Take 40 mg by mouth daily.      Marland Kitchen PARoxetine (PAXIL) 30 MG tablet Take 60 mg by mouth daily.       . Polyvinyl Alcohol-Povidone (MURINE TEARS FOR DRY EYES OP) Place 1 drop into both eyes as needed. For dry eyes      . potassium chloride SA (KLOR-CON M20) 20 MEQ tablet Take 1 tablet (20 mEq total) by mouth daily.  90 tablet  3  . simvastatin (ZOCOR) 40 MG tablet Take 0.5 tablets (20 mg total) by mouth every evening.  30 tablet    . ondansetron (ZOFRAN) 4 MG tablet Take 1 tablet (4 mg total) by mouth every 6 (six) hours.  12 tablet  0    History reviewed. No pertinent family history.   Review of Systems:     Cardiac Review of Systems: Y or N  Chest Pain [   no ]  Resting SOB [no   ] Exertional SOB  Tot[place].Blacker  ]  Vertell Limber [place].Blacker  ]   Pedal Edema [ yes  ]    Palpitations Tot[place].Blacker  ] Syncope  [ no ]   Presyncope [no   ]  General Review of Systems: [Y] = yes [  ]=no Constitional: recent weight change [  ]; anorexia [  ]; fatigue [  ]; nausea [  ]; night sweats [  ]; fever [  ]; or chills [  ]                                                               Dental: poor dentition[  ]; Last Dentist visit: Greater than one year, states she has a broken tooth on the right  Eye : blurred vision [  ]; diplopia [   ]; vision changes [  ];  Amaurosis fugax[  ]; Resp: cough [  ];  wheezing[  ];  hemoptysis[  ]; shortness of breath[  ]; paroxysmal nocturnal dyspnea[ no ]; dyspnea on exertion[yes  ]; or orthopnea[ES  ];  GI:  gallstones[  ], vomiting[  ];  dysphagia[  ]; melena[  ];  hematochezia [  ]; heartburn[  ];   Hx of  Colonoscopy[  ]; GU: kidney stones [  ]; hematuria[  ];   dysuria [  ];  nocturia[  ];  history of     obstruction [  ];  urinary frequency [  ]             Skin: rash, swelling[  ];, hair loss[  ];  peripheral edema[  ];  or itching[  ]; Musculosketetal: myalgias[  ];  joint swelling[  ];  joint erythema[  ];  joint pain[  ];  back pain[  ];  Heme/Lymph: bruising[  ];  bleeding[  ];  anemia[  ];  Neuro: TIA[  ];  headaches[  ];  stroke[  ];  vertigo[  ];  seizures[  ];  paresthesias[  ];  difficulty walking[  ];  Psych:depression[  ]; anxiety[  ];  Endocrine: diabetes[  ];  thyroid dysfunction[  ];  Immunizations: Flu [  ]; Pneumococcal[  ];  Other: Right-hand dominant  Physical Exam: BP 132/116  Pulse 68  Temp(Src) 97.4 F (36.3 C) (Oral)  Resp 18  Ht 5\' 4"  (1.626 m)  Wt 153 lb 14.1 oz (69.8 kg)  BMI 26.4 kg/m2  SpO2 96%  Physical exam General appearance-middle-aged thanks white female no acute distress HEENT-pupils equal normocephalic dentition appears in mild this repair Neck-no JVD mass or bruit Thorax-no deformity or tenderness breath sounds clear except for scattered basilar rales C. OR-regular rhythm for over 6 holosystolic murmur radiating to left axilla, no S3 gallop Abdomen-nontender no mass or organomegaly Extremities-nontender well-perfused good pulses, no hematoma at cardiac cath site Neurologic no focal motor deficit   Diagnostic Studies & Laboratory data:     Recent Radiology Findings:   Dg Orthopantogram  05/16/2013   *RADIOLOGY REPORT*  Clinical Data: Mitral valve regurgitation.  The patient needs mitral valve replacement.  ORTHOPANTOGRAM/PANORAMIC  Comparison: None.  Findings: There are multiple missing teeth. There are no visible periapical abscesses.  The maxillary sinuses are clear.  No visible dental caries.  IMPRESSION: No evidence of active periodontal disease.   Original Report Authenticated By: Lorriane Shire, M.D.   Dg Chest 2 View  05/14/2013   *RADIOLOGY REPORT*  Clinical Data: Weakness, shortness of breath and cough.  CHEST - 2 VIEW  Comparison: 06/28/2012   Findings: Implanted cardiac defibrillator shows stable appearance. There is stable cardiomegaly without evidence of pulmonary edema. No infiltrates or pleural fluid are identified.  The bony thorax shows osteopenia and probable mild loss of height of some of the mid thoracic vertebral bodies which is stable.  IMPRESSION: No active disease.  Stable cardiomegaly and appearance of defibrillator.   Original Report Authenticated By: Aletta Edouard, M.D.   Ct Angio Chest Pe W/cm &/or Wo Cm  05/15/2013   *RADIOLOGY REPORT*  Clinical Data: Short of breath.  Dyspnea.  Chest pain.  Elevated D- dimer.  CT ANGIOGRAPHY CHEST  Technique:  Multidetector CT imaging of the chest using the standard protocol during bolus administration of intravenous contrast. Multiplanar reconstructed images including MIPs were obtained and reviewed to evaluate the vascular anatomy.  Contrast: 160mL OMNIPAQUE IOHEXOL 350 MG/ML SOLN  Comparison: Radiograph 05/14/2013.  Findings: Technically adequate study without pulmonary embolus. Cardiomegaly.  Small pericardial effusion.  Dense calcification of the tracheobronchial tree.  Small bilateral dependently layering pleural effusions.  Aortic atherosclerosis without aneurysm.  Small hiatal hernia.  There is diffuse ground-glass attenuation of the lungs.  This probably relates to a combination of expiratory phase imaging, areas of air trapping and interstitial pulmonary edema.  Interlobular apical septal thickening is present, indicating interstitial pulmonary edema.  There is no axillary adenopathy.  No mediastinal or hilar adenopathy.  Enlargement of the pulmonary artery is probably due to vascular congestion and volume overload.  Exaggerated thoracic kyphosis.  No aggressive osseous lesions.  Right subclavian AICD.  IMPRESSION: 1.  Technically adequate study without pulmonary embolism. 2.  Cardiomegaly, pleural effusions, pulmonary edema are all consistent with CHF.   Original Report Authenticated By:  Dereck Ligas, M.D.      Recent Lab Findings: Lab Results  Component Value Date   WBC 7.0 05/15/2013   HGB 9.8* 05/15/2013   HCT 28.8* 05/15/2013   PLT 265 05/15/2013   GLUCOSE 108* 05/16/2013   CHOL 156  05/15/2013   TRIG 140 05/15/2013   HDL 38* 05/15/2013   LDLCALC 90 05/15/2013   ALT 70* 05/16/2013   AST 59* 05/16/2013   NA 135 05/16/2013   K 4.3 05/16/2013   CL 99 05/16/2013   CREATININE 1.18* 05/16/2013   BUN 23 05/16/2013   CO2 22 05/16/2013   TSH 9.996* 05/15/2013   INR 1.27 05/16/2013      Assessment / Plan:     I discussed mitral valve repair-replaced with patient and she is willing to proceed. We will contact the dental clinic for an evaluation and have her totally scheduled for surgery Thursday a.m. August 7. Will ask cardiac rehabilitation 0 she is in the hospital.       @ME1 @ 05/16/2013 6:21 PM

## 2013-05-16 NOTE — Progress Notes (Signed)
Pt. Alert and oriented this am. No s/s of distress noted. Pt. Denies pain. Pt. To go for Cardiac cath this am. Educational video viewed by pt. Pt. Stated she had "butterflies" about the procedure but was okay. Pt. Resting in bed currently. Call light within reach. RN will continue to monitor pt. For changes in condition. Samreen Seltzer, Katherine Roan

## 2013-05-16 NOTE — Progress Notes (Signed)
05/16/13.Marland KitchenMarland KitchenFuller Mandril, RN, BSN, [employer] [phone] Spoke with pt at beside regarding discharge planning for disease mangement of CHF.  Offered pt list of Home Health agencies.  Pt chose [facility] to render services. Janae Sauce of [facility] notified.  No DME needs indentified at this time.

## 2013-05-16 NOTE — H&P (Signed)
  No changes in the H&P 

## 2013-05-17 ENCOUNTER — Inpatient Hospital Stay (HOSPITAL_COMMUNITY): Payer: Medicare Other

## 2013-05-17 DIAGNOSIS — I509 Heart failure, unspecified: Secondary | ICD-10-CM

## 2013-05-17 DIAGNOSIS — I340 Nonrheumatic mitral (valve) insufficiency: Secondary | ICD-10-CM

## 2013-05-17 DIAGNOSIS — Z01810 Encounter for preprocedural cardiovascular examination: Secondary | ICD-10-CM

## 2013-05-17 HISTORY — DX: Heart failure, unspecified: I50.9

## 2013-05-17 HISTORY — DX: Nonrheumatic mitral (valve) insufficiency: I34.0

## 2013-05-17 LAB — COMPREHENSIVE METABOLIC PANEL
ALT: 62 U/L — ABNORMAL HIGH (ref 0–35)
BUN: 19 mg/dL (ref 6–23)
CO2: 29 mEq/L (ref 19–32)
Calcium: 9.5 mg/dL (ref 8.4–10.5)
GFR calc Af Amer: 51 mL/min — ABNORMAL LOW (ref >90)
GFR calc non Af Amer: 44 mL/min — ABNORMAL LOW (ref >90)
Glucose, Bld: 103 mg/dL — ABNORMAL HIGH (ref 70–99)
Sodium: 140 mEq/L (ref 135–145)
Total Protein: 7.2 g/dL (ref 6.0–8.3)

## 2013-05-17 LAB — HEMOGLOBIN A1C
Hgb A1c MFr Bld: 5.1 % (ref <5.7)
Mean Plasma Glucose: 100 mg/dL (ref <117)

## 2013-05-17 LAB — PREALBUMIN: Prealbumin: 24 mg/dL (ref 17.0–34.0)

## 2013-05-17 LAB — PROTIME-INR: Prothrombin Time: 13.5 seconds (ref 11.6–15.2)

## 2013-05-17 MED ORDER — ENSURE COMPLETE PO LIQD
237.0000 mL | Freq: Two times a day (BID) | ORAL | Status: DC
  Administered 2013-05-17 – 2013-05-23 (×8): 237 mL via ORAL

## 2013-05-17 MED ORDER — ENSURE PUDDING PO PUDG
1.0000 | Freq: Three times a day (TID) | ORAL | Status: DC
  Administered 2013-05-17 – 2013-05-20 (×8): 1 via ORAL

## 2013-05-17 MED ORDER — ALBUTEROL SULFATE (5 MG/ML) 0.5% IN NEBU
2.5000 mg | INHALATION_SOLUTION | Freq: Once | RESPIRATORY_TRACT | Status: AC
  Administered 2013-05-17: 2.5 mg via RESPIRATORY_TRACT

## 2013-05-17 NOTE — Progress Notes (Signed)
VASCULAR LAB PRELIMINARY  PRELIMINARY  PRELIMINARY  PRELIMINARY  Pre-op Cardiac Surgery  Carotid Findings:  Bilateral - 1 - 39 % ICA stenosis. Vertebral artery flow is antegrade  Upper Extremity Right Left  Brachial Pressures 135 Triphasic 135 Triphasic  Radial Waveforms Triphasic Triphasic  Ulnar Waveforms Triphasic Triphasic  Palmar Arch (Allen's Test) Normal Abnormal   Findings:  Doppler waveforms on the right remained normal with both radial and ulnar compressions. Left Doppler waveforms reamined normal with radial compression and OBLITERATED with ulnar compression   Raffaele Derise, RVTS  05/17/2013, 10:37 AM

## 2013-05-17 NOTE — Progress Notes (Signed)
Subjective:  Patient denies any chest pain or shortness of breath. Appreciate CVTS. consult. Schedule for MVR on August 7. Scheduled to see a dentist prior to surgery  Objective:  Vital Signs in the last 24 hours: Temp:  [97.4 F (36.3 C)-98 F (36.7 C)] 98 F (36.7 C) (08/01 0627) Pulse Rate:  [63-71] 63 (08/01 0627) Resp:  [18] 18 (08/01 0627) BP: (128-142)/(66-116) 140/71 mmHg (08/01 0627) SpO2:  [94 %-96 %] 95 % (08/01 0627) Weight:  [67.9 kg (149 lb 11.1 oz)] 67.9 kg (149 lb 11.1 oz) (08/01 0627)  Intake/Output from previous day: 07/31 0701 - 08/01 0700 In: 562 [P.O.:562] Out: 525 [Urine:525] Intake/Output from this shift: Total I/O In: 600 [P.O.:600] Out: 800 [Urine:800]  Physical Exam: Neck: no adenopathy, no carotid bruit, no JVD and supple, symmetrical, trachea midline Lungs: clear to auscultation bilaterally Heart: regular rate and rhythm, S1, S2 normal and 3/6 systolic murmur noted soft click noted Abdomen: soft, non-tender; bowel sounds normal; no masses,  no organomegaly Extremities: extremities normal, atraumatic, no cyanosis or edema and Right groin stable  Lab Results:  Recent Labs  05/14/13 2325 05/15/13 1050  WBC 5.8 7.0  HGB 9.7* 9.8*  PLT 236 265    Recent Labs  05/16/13 0540 05/17/13 0525  NA 135 140  K 4.3 3.8  CL 99 97  CO2 22 29  GLUCOSE 108* 103*  BUN 23 19  CREATININE 1.18* 1.25*    Recent Labs  05/15/13 1502 05/15/13 2033  TROPONINI <0.30 <0.30   Hepatic Function Panel  Recent Labs  05/17/13 0525  PROT 7.2  ALBUMIN 4.2  AST 53*  ALT 62*  ALKPHOS 71  BILITOT 0.7    Recent Labs  05/15/13 1502  CHOL 156   No results found for this basename: PROTIME,  in the last 72 hours  Imaging: Imaging results have been reviewed and Dg Orthopantogram  05/16/2013   *RADIOLOGY REPORT*  Clinical Data: Mitral valve regurgitation.  The patient needs mitral valve replacement.  ORTHOPANTOGRAM/PANORAMIC  Comparison: None.  Findings:  There are multiple missing teeth. There are no visible periapical abscesses.  The maxillary sinuses are clear.  No visible dental caries.  IMPRESSION: No evidence of active periodontal disease.   Original Report Authenticated By: Lorriane Shire, M.D.    Cardiac Studies:  Assessment/Plan:  Symptomatic severe mitral regurgitation with flail posterior mitral leaflet  status post left cath History of V. fib cardiac arrest status post single-chamber ICD in the past  Hypertension  Compensated congestive heart failure secondary to valvular heart disease as above  Hypercholesteremia  Hypothyroidism  Irritable bowel syndrome  Chronic anemia  Scoliosis  GERD  Plan  Continue present management Evaluation for MVR in progress scheduled for MVR on August 7 I will sign off please call if needed  LOS: 3 days    Zacharius Funari N 05/17/2013, 10:52 AM   He

## 2013-05-17 NOTE — Progress Notes (Signed)
INITIAL NUTRITION ASSESSMENT  DOCUMENTATION CODES Per approved criteria  -Not Applicable   INTERVENTION: 1. Continue Ensure Pudding po once each day, each supplement provides 170 kcal and 4 grams of protein.  2. Ensure Complete po BID, each supplement provides 350 kcal and 13 grams of protein.  NUTRITION DIAGNOSIS: Inadequate oral intake related to CHF as evidenced by reported decrease in appetite and weight loss.   Goal: Pt to meet >/= 90% of their estimated nutrition needs   Monitor:  Weight, po intake, fluid balance, labs  Reason for Assessment: Consult  67 y.o. female  Admitting Dx: Chronic systolic heart failure  ASSESSMENT:  Pt admitted with c/o generalized weakness. She underwent a CT angiogram of chest PO protocol 7/30. Pt was diuresed. SP day 3 catheterization. Scheduled for mitral valve repair-replacement 8/7.   Pt says that she has had a good appetite since being in the hospital. She reports a decreased appetite PTA because she was not feeling well. She was unsure of her usual body weight but thought that her weight loss was mostly from fluid. She is currently receiving ensure pudding and would like to also have Ensure Plus to supplement her meals.   Height: Ht Readings from Last 1 Encounters:  05/15/13 5\' 4"  (1.626 m)    Weight: Wt Readings from Last 1 Encounters:  05/17/13 149 lb 11.1 oz (67.9 kg)    Ideal Body Weight: 54.7 kg  % Ideal Body Weight: 124%  Wt Readings from Last 10 Encounters:  05/17/13 149 lb 11.1 oz (67.9 kg)  05/17/13 149 lb 11.1 oz (67.9 kg)  05/09/13 170 lb (77.111 kg)  09/20/12 167 lb (75.751 kg)  07/03/12 172 lb (78.019 kg)  05/25/12 173 lb 12.8 oz (78.835 kg)  04/20/10 171 lb (77.565 kg)    Usual Body Weight: unknown  % Usual Body Weight: unknown  BMI:  Body mass index is 25.68 kg/(m^2).  Estimated Nutritional Needs: Kcal: 1700-1900 Protein: 90-100 g Fluid: 1.7-1.9 L  Skin: WNL  Diet Order: Cardiac  EDUCATION  NEEDS: -No education needs identified at this time   Intake/Output Summary (Last 24 hours) at 05/17/13 1242 Last data filed at 05/17/13 1057  Gross per 24 hour  Intake   1162 ml  Output   1326 ml  Net   -164 ml    Last BM: 8/1   Labs:   Recent Labs Lab 05/15/13 1050 05/16/13 0540 05/17/13 0525  NA 136 135 140  K 3.9 4.3 3.8  CL 96 99 97  CO2 25 22 29   BUN 17 23 19   CREATININE 1.15* 1.18* 1.25*  CALCIUM 9.5 9.2 9.5  MG 2.4  --   --   GLUCOSE 102* 108* 103*    CBG (last 3)  No results found for this basename: GLUCAP,  in the last 72 hours  Scheduled Meds: . aspirin EC  325 mg Oral Daily  . feeding supplement  1 Container Oral TID WC  . furosemide  80 mg Intravenous Once  . furosemide  80 mg Oral Daily  . hydrOXYzine  10 mg Oral QHS  . levothyroxine  100 mcg Oral QAC breakfast  . lisinopril  20 mg Oral BID  . metoprolol succinate  50 mg Oral Daily  . pantoprazole  40 mg Oral Daily  . PARoxetine  60 mg Oral Daily  . sodium chloride  3 mL Intravenous Q12H    Continuous Infusions:   Past Medical History  Diagnosis Date  . Arthritis   .  Hypertension   . A-fib   . High cholesterol   . Hypothyroidism   . Depression   . Shortness of breath     exertion  . Pacemaker     icd  . CHF (congestive heart failure)   . GERD (gastroesophageal reflux disease)   . [id number])     Past Surgical History  Procedure Laterality Date  . Cardiac defibrillator placement    . Abdominal hysterectomy    . Hernia repair  as child    x 2  . Tee without cardioversion  09/24/2012    Procedure: TRANSESOPHAGEAL ECHOCARDIOGRAM (TEE);  Surgeon: Fay Records, MD;  Location: [facility] [place];  Service: Cardiovascular;  Laterality: N/A;    Terrace Arabia RD, LDN

## 2013-05-17 NOTE — Progress Notes (Signed)
Pt. Alert and stable this am. No s/s of distress or discomfort noted. Pt. Denies pain this am.  Pt. Rested well during the night. R groin Level 0. VSS. Blood pressure 140/71, pulse 63, temperature 98 F (36.7 C), temperature source Oral, resp. rate 18, height 5\' 4"  (1.626 m), weight 67.9 kg (149 lb 11.1 oz), SpO2 95.00%. RN will continue to monitor pt. For changes in condition. Madisun Hargrove, Katherine Roan

## 2013-05-17 NOTE — Progress Notes (Signed)
Pt going to Vascular lab to have pre procedure labs drawn. CMT notified that pt leaving floor. Ticket to ride given.

## 2013-05-17 NOTE — Progress Notes (Signed)
Sarah from respiratory therapy called and stated she was tubing results from session to unit. Papers received and placed in patients hard chart

## 2013-05-17 NOTE — Progress Notes (Signed)
Patient evaluated for community based chronic disease management services with [facility] [facility] as a benefit of patient's [employer]. Spoke with patient at bedside to explain [facility] services. She self admits to poor diet compliance. Lives alone with her dog and has a son that resides close by. He provides meals and medication management.  Patient would like to discuss services with her PCP prior to engagement.  She has a scale at home but does not weigh herself.  Left contact information and [facility] literature at bedside. Made inpatient Case Manager aware that [place] [facility] following. Of note, [facility] [place] Care Management services does not replace or interfere with any services that are arranged by inpatient case management or social work. For additional questions or referrals please contact Corliss Blacker BSN RN [facility] Liaison at [phone].

## 2013-05-17 NOTE — Progress Notes (Deleted)
Patient evaluated for community based chronic disease management services with [place] [facility] as a benefit of patient's [employer].  Spoke with patient at bedside to explain [place] [facility] services.  She self admits to poor diet compliance.  Lives alone with her dog and has a son that resides close by.  He provides meals and medication management.  Patient would like to discuss services with her PCP prior to engagement.  Left contact information and [facility] literature at bedside. Made inpatient Case Manager aware that [place] Management following. Of note, [facility] Care Management services does not replace or interfere with any services that are arranged by inpatient case management or social work.  For additional questions or referrals please contact Corliss Blacker BSN RN [facility] Liaison at [phone].

## 2013-05-17 NOTE — Progress Notes (Signed)
[facility] Progress note  [name] [id number] DOB: 1946/05/24 DOA: 05/14/2013  Referring physician:  PCP: Ria Bush, MD  Specialists:   Chief Complaint: Increasing DOE, fatigue  HPI: [name] is a 67 y.o. WF PMHx CHF, mitral regurg, cardiac arrhythmia, atrial fibrillation, HTN, HLD, hypothyroidism. She was seen in the emergency Department 5 days ago with vomiting and diarrhea. Which she said resolved by Tuesday. States Over the last several days, she has noted dyspnea with exertion when she walks as little as 4 feet, positive fatigue, negative chest pain, negative firing of her defibrillator (St. Jude single chamber ICD ), positive anorexia, slightly elevated temperature measured at 99.3, right lower extremity swelling, bilateral lower extremity coldness. There's been a cough which is intermittently productive of yellowish sputum. Nothing makes symptoms better nothing makes it worse. States she had a cardiac echo in 2013 but does not recall results states cardiac defibrillator placed 2010. Sees 3 cardiologist @  [facility] ( Dr Barbaraann Boys, Dr taylor, Dr Harrington Challenger). TODAY S/P day 2 catheterization by Dr. Charolette Forward (cardiology). Patient will require mitral valve repair /replacement and we're awaiting results of dictation to determine future course of care. Patient resting comfortably today with no complaints.   Procedure;   CT angiogram of chest PE protocol 05/15/2013 1. Technically adequate study without pulmonary embolism.  2. Cardiomegaly, pleural effusions, pulmonary edema are all  consistent with CHF.   Cardiac catheterization 05/15/2013    Past Medical History  Diagnosis Date  . Arthritis   . Hypertension   . A-fib   . High cholesterol   . Hypothyroidism   . Depression   . Shortness of breath     exertion  . Pacemaker     icd  . CHF (congestive heart failure)   . GERD (gastroesophageal reflux disease)   . [id number])    Past Surgical History   Procedure Laterality Date  . Cardiac defibrillator placement    . Abdominal hysterectomy    . Hernia repair  as child    x 2  . Tee without cardioversion  09/24/2012    Procedure: TRANSESOPHAGEAL ECHOCARDIOGRAM (TEE);  Surgeon: Fay Records, MD;  Location: [facility] ENDOSCOPY;  Service: Cardiovascular;  Laterality: N/A;   Social History:  reports that she has never smoked. She has never used smokeless tobacco. She reports that she does not drink alcohol or use illicit drugs.   Allergies  Allergen Reactions  . Codeine Nausea And Vomiting  . Meperidine Hcl Nausea And Vomiting    History reviewed. No pertinent family history.   Prior to Admission medications   Medication Sig Start Date End Date Taking? Authorizing Provider  acetaminophen (TYLENOL) 500 MG tablet Take 1,000 mg by mouth every 6 (six) hours as needed. For pain   Yes Historical Provider, MD  aspirin EC 325 MG tablet Take 325 mg by mouth every other day.   Yes Historical Provider, MD  Aspirin-Acetaminophen-Caffeine (GOODY HEADACHE PO) Take 1 packet by mouth as needed. For pain   Yes Historical Provider, MD  diazepam (VALIUM) 2 MG tablet Take 2 mg by mouth 3 (three) times daily. For anxiety   Yes Historical Provider, MD  fish oil-omega-3 fatty acids 1000 MG capsule Take 1 g by mouth daily.   Yes Historical Provider, MD  furosemide (LASIX) 40 MG tablet Take 2 tablets (80 mg total) by mouth daily. 10/24/12  Yes Evans Lance, MD  hydrOXYzine (ATARAX/VISTARIL) 10 MG tablet Take 10 mg by mouth at bedtime.  Yes Historical Provider, MD  levothyroxine (SYNTHROID, LEVOTHROID) 88 MCG tablet Take 88 mcg by mouth daily.   Yes Historical Provider, MD  lisinopril (PRINIVIL,ZESTRIL) 10 MG tablet Take 10 mg by mouth 2 (two) times daily.   Yes Historical Provider, MD  metoprolol succinate (TOPROL-XL) 50 MG 24 hr tablet Take 50 mg by mouth daily. Take with or immediately following a meal.   Yes Historical Provider, MD  pantoprazole (PROTONIX) 40 MG  tablet Take 40 mg by mouth daily.   Yes Historical Provider, MD  PARoxetine (PAXIL) 30 MG tablet Take 60 mg by mouth daily.    Yes Historical Provider, MD  Polyvinyl Alcohol-Povidone (MURINE TEARS FOR DRY EYES OP) Place 1 drop into both eyes as needed. For dry eyes   Yes Historical Provider, MD  potassium chloride SA (KLOR-CON M20) 20 MEQ tablet Take 1 tablet (20 mEq total) by mouth daily. 07/03/12  Yes Evans Lance, MD  simvastatin (ZOCOR) 40 MG tablet Take 0.5 tablets (20 mg total) by mouth every evening. 09/20/12  Yes Evans Lance, MD  ondansetron (ZOFRAN) 4 MG tablet Take 1 tablet (4 mg total) by mouth every 6 (six) hours. 05/09/13   Rhunette Croft, MD   Physical Exam: Filed Vitals:   05/16/13 2055 05/16/13 2252 05/17/13 0627 05/17/13 1058  BP: 133/72 134/82 140/71 137/69  Pulse: 70 63 63 63  Temp: 98 F (36.7 C)  98 F (36.7 C)   TempSrc: Oral  Oral   Resp: 18  18   Height:      Weight:   67.9 kg (149 lb 11.1 oz)   SpO2: 94%  95% 94%     General: Alert, NAD  Cardiovascular: Regular rhythm and rate, grade 4 holosystolic murmur, negative rubs or gallops, PD/TP pulses 2+ bilateral  Respiratory: Clear to auscultation bilaterally  Abdomen: Soft nontender nondistended plus bowel sounds  Muscle skeletal; right inguinal catheterization incision covered, dry, no signs of infection    Labs on Admission:  Basic Metabolic Panel:  Recent Labs Lab 05/14/13 2325 05/15/13 1050 05/16/13 0540 05/17/13 0525  NA 136 136 135 140  K 4.8 3.9 4.3 3.8  CL 100 96 99 97  CO2 22 25 22 29   GLUCOSE 101* 102* 108* 103*  BUN 15 17 23 19   CREATININE 1.01 1.15* 1.18* 1.25*  CALCIUM 9.2 9.5 9.2 9.5  MG  --  2.4  --   --    Liver Function Tests:  Recent Labs Lab 05/14/13 2325 05/15/13 1050 05/16/13 0540 05/17/13 0525  AST 69* 75* 59* 53*  ALT 78* 83* 70* 62*  ALKPHOS 64 67 68 71  BILITOT 0.9 1.1 0.9 0.7  PROT 6.4 6.6 6.4 7.2  ALBUMIN 3.8 4.1 4.0 4.2    Recent Labs Lab  05/14/13 2325  LIPASE 19   No results found for this basename: AMMONIA,  in the last 168 hours CBC:  Recent Labs Lab 05/14/13 2325 05/15/13 1050  WBC 5.8 7.0  NEUTROABS 4.5 5.0  HGB 9.7* 9.8*  HCT 28.8* 28.8*  MCV 98.6 98.0  PLT 236 265   Cardiac Enzymes:  Recent Labs Lab 05/14/13 2325 05/15/13 1050 05/15/13 1502 05/15/13 2033  TROPONINI <0.30 <0.30 <0.30 <0.30    BNP (last 3 results)  Recent Labs  05/15/13 0200 05/16/13 0540  PROBNP 13764.0* 15859.0*   CBG: No results found for this basename: GLUCAP,  in the last 168 hours  Radiological Exams on Admission: Dg Orthopantogram  05/17/2013   **ADDENDUM** CREATED:  05/17/2013 12:37:51  At the request of the ordering physician, the Panorex was repeated. There is improvement in blurring of the central teeth related to Panorex technique.  **END ADDENDUM** SIGNED BY: Monia Sabal. Carlis Abbott, M.D.  05/16/2013   *RADIOLOGY REPORT*  Clinical Data: Mitral valve regurgitation.  The patient needs mitral valve replacement.  ORTHOPANTOGRAM/PANORAMIC  Comparison: None.  Findings: There are multiple missing teeth. There are no visible periapical abscesses.  The maxillary sinuses are clear.  No visible dental caries.  IMPRESSION: No evidence of active periodontal disease.   Original Report Authenticated By: Lorriane Shire, M.D.    EKG: Pending  Assessment/Plan Principal Problem:   Chronic systolic heart failure Active Problems:   HYPOTHYROIDISM   HYPERLIPIDEMIA   HYPERTENSION   CARDIAC ARRHYTHMIA   Mitral regurgitation   Anxiety state, unspecified   Atrial fibrillation   1. CHF; patient unsure what her baseline weight is current weight is 153 pounds,. We'll continue to diuresis patient. Continue Strict I/O and QD Weights. BNP= 15859 2. Atrial fibrillation; currently not in A-Fib. Continue to hold Coumadin 2dary to possible surgery. Start patient on SCD for DVT  Prophylaxis 3.  HLD; patient's cholesterol level within NCEP  guidelines 4. Hypothyroidism; TSH= 9.99(high), free T4= 1.55(NL), patient therefore meets criteria for sub clinical hypothyroidism will increase her Synthroid to 100 mcg daily 5. Mitral Valve Regurgitation; Dr Darcey Nora (CTSurgery) has been contacted by Dr.Mohan [facility] (cardiology), . And we'll see patient and determine the plan of care concerning repair/replacement of mitral. NOTE spoke with Dr.Van Trigt office and they have schedule patient for an inpatient dental consult prior to surgery which is scheduled for next week (Thursday)   Code Status: Full Family Communication:  Disposition Plan: Per cardiology/CT surgery  Time spent: 45 minutes  Allie Bossier [facility] Pager [phone]  If 7PM-7AM, please contact night-coverage [email or web address] Password [email or web address] 05/17/2013, 1:01 PM

## 2013-05-17 NOTE — Clinical Documentation Improvement (Signed)
THIS DOCUMENT IS NOT A PERMANENT PART OF THE MEDICAL RECORD  Please update your documentation with the medical record to reflect your response to this query. If you need help knowing how to do this please call [phone].  05/17/13  Dr. Sherral Hammers,  In a better effort to capture your patient's severity of illness, reflect appropriate length of stay and utilization of resources, a review of the patient medical record has revealed the following indicators:   - Congestive Heart Failure 2/2 Valvular Heart Disease documented in progress notes   Based on your clinical judgment, please document the Acuity and Type of Heart Failure monitored and treated this admission in the progress notes and discharge summary:  Acuity:  - Acute  - Acute on Chronic  - Chronic  And  Type:  - Systolic  - Diastolic  - Combined   In responding to this query please exercise your independent judgment.    The fact that a query is asked, does not imply that any particular answer is desired or expected.    Reviewed: 05/19/2013  Thank Dennis Bast,  Erling Conte  Clinical Documentation Specialist: [place]   This condition is Acute on Chronic and awaiting surgery for repair/replace which is in several of my notes please see A&P

## 2013-05-18 LAB — PROTIME-INR
INR: 1.04 (ref 0.00–1.49)
Prothrombin Time: 13.4 seconds (ref 11.6–15.2)

## 2013-05-18 LAB — COMPREHENSIVE METABOLIC PANEL
Albumin: 4.2 g/dL (ref 3.5–5.2)
BUN: 24 mg/dL — ABNORMAL HIGH (ref 6–23)
Chloride: 91 mEq/L — ABNORMAL LOW (ref 96–112)
Creatinine, Ser: 1.19 mg/dL — ABNORMAL HIGH (ref 0.50–1.10)
GFR calc Af Amer: 54 mL/min — ABNORMAL LOW (ref >90)
GFR calc non Af Amer: 47 mL/min — ABNORMAL LOW (ref >90)
Glucose, Bld: 100 mg/dL — ABNORMAL HIGH (ref 70–99)
Total Bilirubin: 0.7 mg/dL (ref 0.3–1.2)

## 2013-05-18 LAB — GLUCOSE, CAPILLARY: Glucose-Capillary: 162 mg/dL — ABNORMAL HIGH (ref 70–99)

## 2013-05-18 MED ORDER — ALUM & MAG HYDROXIDE-SIMETH 200-200-20 MG/5ML PO SUSP
30.0000 mL | ORAL | Status: DC | PRN
  Administered 2013-05-18: 30 mL via ORAL
  Filled 2013-05-18: qty 30

## 2013-05-18 NOTE — Progress Notes (Signed)
[facility] Progress note  Mechele Lovinger [id number] DOB: 09/10/1946 DOA: 05/14/2013  Referring physician:  PCP: Ria Bush, MD  Specialists:   Chief Complaint: Increasing DOE, fatigue  HPI: [name] is a 67 y.o. WF PMHx CHF, mitral regurg, cardiac arrhythmia, atrial fibrillation, HTN, HLD, hypothyroidism. She was seen in the emergency Department 5 days ago with vomiting and diarrhea. Which she said resolved by Tuesday. States Over the last several days, she has noted dyspnea with exertion when she walks as little as 4 feet, positive fatigue, negative chest pain, negative firing of her defibrillator (St. Jude single chamber ICD ), positive anorexia, slightly elevated temperature measured at 99.3, right lower extremity swelling, bilateral lower extremity coldness. There's been a cough which is intermittently productive of yellowish sputum. Nothing makes symptoms better nothing makes it worse. States she had a cardiac echo in 2013 but does not recall results states cardiac defibrillator placed 2010. Sees 3 cardiologist @  [facility] ( Dr Barbaraann Boys, Dr taylor, Dr Harrington Challenger). TODAY S/P day 3 catheterization by Dr. Charolette Forward (cardiology). Patient will require mitral valve repair /replacement by Dr Nils Pyle (CT Surgeon), patient resting comfortably  Procedure;   CT angiogram of chest PE protocol 05/15/2013 1. Technically adequate study without pulmonary embolism.  2. Cardiomegaly, pleural effusions, pulmonary edema are all  consistent with CHF.   Cardiac catheterization 05/15/2013    Past Medical History  Diagnosis Date  . Arthritis   . Hypertension   . A-fib   . High cholesterol   . Hypothyroidism   . Depression   . Shortness of breath     exertion  . Pacemaker     icd  . CHF (congestive heart failure)   . GERD (gastroesophageal reflux disease)   . [id number])    Past Surgical History  Procedure Laterality Date  . Cardiac defibrillator placement    .  Abdominal hysterectomy    . Hernia repair  as child    x 2  . Tee without cardioversion  09/24/2012    Procedure: TRANSESOPHAGEAL ECHOCARDIOGRAM (TEE);  Surgeon: Fay Records, MD;  Location: [facility] ENDOSCOPY;  Service: Cardiovascular;  Laterality: N/A;   Social History:  reports that she has never smoked. She has never used smokeless tobacco. She reports that she does not drink alcohol or use illicit drugs.   Allergies  Allergen Reactions  . Codeine Nausea And Vomiting  . Meperidine Hcl Nausea And Vomiting    History reviewed. No pertinent family history.   Prior to Admission medications   Medication Sig Start Date End Date Taking? Authorizing Provider  acetaminophen (TYLENOL) 500 MG tablet Take 1,000 mg by mouth every 6 (six) hours as needed. For pain   Yes Historical Provider, MD  aspirin EC 325 MG tablet Take 325 mg by mouth every other day.   Yes Historical Provider, MD  Aspirin-Acetaminophen-Caffeine (GOODY HEADACHE PO) Take 1 packet by mouth as needed. For pain   Yes Historical Provider, MD  diazepam (VALIUM) 2 MG tablet Take 2 mg by mouth 3 (three) times daily. For anxiety   Yes Historical Provider, MD  fish oil-omega-3 fatty acids 1000 MG capsule Take 1 g by mouth daily.   Yes Historical Provider, MD  furosemide (LASIX) 40 MG tablet Take 2 tablets (80 mg total) by mouth daily. 10/24/12  Yes Evans Lance, MD  hydrOXYzine (ATARAX/VISTARIL) 10 MG tablet Take 10 mg by mouth at bedtime.    Yes Historical Provider, MD  levothyroxine (SYNTHROID, LEVOTHROID) 88 MCG tablet  Take 88 mcg by mouth daily.   Yes Historical Provider, MD  lisinopril (PRINIVIL,ZESTRIL) 10 MG tablet Take 10 mg by mouth 2 (two) times daily.   Yes Historical Provider, MD  metoprolol succinate (TOPROL-XL) 50 MG 24 hr tablet Take 50 mg by mouth daily. Take with or immediately following a meal.   Yes Historical Provider, MD  pantoprazole (PROTONIX) 40 MG tablet Take 40 mg by mouth daily.   Yes Historical Provider, MD   PARoxetine (PAXIL) 30 MG tablet Take 60 mg by mouth daily.    Yes Historical Provider, MD  Polyvinyl Alcohol-Povidone (MURINE TEARS FOR DRY EYES OP) Place 1 drop into both eyes as needed. For dry eyes   Yes Historical Provider, MD  potassium chloride SA (KLOR-CON M20) 20 MEQ tablet Take 1 tablet (20 mEq total) by mouth daily. 07/03/12  Yes Evans Lance, MD  simvastatin (ZOCOR) 40 MG tablet Take 0.5 tablets (20 mg total) by mouth every evening. 09/20/12  Yes Evans Lance, MD  ondansetron (ZOFRAN) 4 MG tablet Take 1 tablet (4 mg total) by mouth every 6 (six) hours. 05/09/13   Rhunette Croft, MD   Physical Exam: Filed Vitals:   05/17/13 1335 05/17/13 2057 05/18/13 0523 05/18/13 1026  BP: 124/71 120/88 131/83 117/50  Pulse: 63 57 59   Temp: 98.2 F (36.8 C) 98.3 F (36.8 C) 98.4 F (36.9 C)   TempSrc: Oral Oral Oral   Resp: 18 18 18    Height:      Weight:   67.2 kg (148 lb 2.4 oz)   SpO2: 96% 95% 99%      General: Alert, NAD  Cardiovascular: Regular rhythm and rate, grade 4 holosystolic murmur, negative rubs or gallops, PD/TP pulses 2+ bilateral  Respiratory: Clear to auscultation bilaterally  Abdomen: Soft nontender nondistended plus bowel sounds    Labs on Admission:  Basic Metabolic Panel:  Recent Labs Lab 05/14/13 2325 05/15/13 1050 05/16/13 0540 05/17/13 0525 05/18/13 0545  NA 136 136 135 140 135  K 4.8 3.9 4.3 3.8 3.7  CL 100 96 99 97 91*  CO2 22 25 22 29 28   GLUCOSE 101* 102* 108* 103* 100*  BUN 15 17 23 19  24*  CREATININE 1.01 1.15* 1.18* 1.25* 1.19*  CALCIUM 9.2 9.5 9.2 9.5 9.6  MG  --  2.4  --   --   --    Liver Function Tests:  Recent Labs Lab 05/14/13 2325 05/15/13 1050 05/16/13 0540 05/17/13 0525 05/18/13 0545  AST 69* 75* 59* 53* 45*  ALT 78* 83* 70* 62* 52*  ALKPHOS 64 67 68 71 74  BILITOT 0.9 1.1 0.9 0.7 0.7  PROT 6.4 6.6 6.4 7.2 7.1  ALBUMIN 3.8 4.1 4.0 4.2 4.2    Recent Labs Lab 05/14/13 2325  LIPASE 19   No results found for  this basename: AMMONIA,  in the last 168 hours CBC:  Recent Labs Lab 05/14/13 2325 05/15/13 1050  WBC 5.8 7.0  NEUTROABS 4.5 5.0  HGB 9.7* 9.8*  HCT 28.8* 28.8*  MCV 98.6 98.0  PLT 236 265   Cardiac Enzymes:  Recent Labs Lab 05/14/13 2325 05/15/13 1050 05/15/13 1502 05/15/13 2033  TROPONINI <0.30 <0.30 <0.30 <0.30    BNP (last 3 results)  Recent Labs  05/15/13 0200 05/16/13 0540  PROBNP 13764.0* 15859.0*   CBG: No results found for this basename: GLUCAP,  in the last 168 hours  Radiological Exams on Admission: Dg Orthopantogram  05/17/2013   **ADDENDUM** CREATED:  05/17/2013 12:37:51  At the request of the ordering physician, the Panorex was repeated. There is improvement in blurring of the central teeth related to Panorex technique.  **END ADDENDUM** SIGNED BY: Monia Sabal. Carlis Abbott, M.D.  05/16/2013   *RADIOLOGY REPORT*  Clinical Data: Mitral valve regurgitation.  The patient needs mitral valve replacement.  ORTHOPANTOGRAM/PANORAMIC  Comparison: None.  Findings: There are multiple missing teeth. There are no visible periapical abscesses.  The maxillary sinuses are clear.  No visible dental caries.  IMPRESSION: No evidence of active periodontal disease.   Original Report Authenticated By: Lorriane Shire, M.D.    EKG: Pending  Assessment/Plan Principal Problem:   Chronic systolic heart failure Active Problems:   HYPOTHYROIDISM   HYPERLIPIDEMIA   HYPERTENSION   CARDIAC ARRHYTHMIA   Mitral regurgitation   Anxiety state, unspecified   Atrial fibrillation   1. CHF; patient unsure what her baseline weight is current weight is 153 pounds,. We'll continue to diuresis patient. Continue Strict I/O and QD Weights. BNP= 15859 2. Atrial fibrillation; currently not in A-Fib. Continue to hold Coumadin 2dary to possible surgery. Start patient on SCD for DVT  Prophylaxis 3.  HLD; patient's cholesterol level within NCEP guidelines 4. Hypothyroidism; TSH= 9.99(high), free T4= 1.55(NL),  patient therefore meets criteria for sub clinical hypothyroidism will increase her Synthroid to 100 mcg daily 5. Mitral Valve Regurgitation; Dr Darcey Nora (CTSurgery) has been contacted by Dr.Mohan [facility] (cardiology), . And we'll see patient and determine the plan of care concerning repair/replacement of mitral. NOTE spoke with Dr.Van Trigt office and they have schedule patient for an inpatient dental consult prior to surgery which is scheduled for next week (Thursday)   Code Status: Full Family Communication:  Disposition Plan: Per cardiology/CT surgery  Time spent: 45 minutes  Allie Bossier [facility] Pager [phone]  If 7PM-7AM, please contact night-coverage [email or web address] Password [email or web address] 05/18/2013, 1:25 PM

## 2013-05-18 NOTE — Progress Notes (Signed)
Pre Op Visit by Cardiac Rehab.  [name] was very sleepy, did not feel she could focus.  We did discuss Cardiac Rehab Phase 1 and Phase 2 and we will be seeing her the first part of the week.  Leverne Humbles RN

## 2013-05-18 NOTE — Progress Notes (Signed)
Pt. Alert and oriented x4. No s/s of distress or discomfort noted. Pt. C/o headache this am. PRN tylenol given and effective. Pt. Currently resting in bed quietly. Call light within reach. Blood pressure 120/88, pulse 57, temperature 98.3 F (36.8 C), temperature source Oral, resp. rate 18, height 5\' 4"  (1.626 m), weight 67.9 kg (149 lb 11.1 oz), SpO2 95.00%.. RN will continue to monitor pt. For changes in condition. Delane Wessinger, Katherine Roan

## 2013-05-19 LAB — PROTIME-INR
INR: 0.89 (ref 0.00–1.49)
Prothrombin Time: 11.9 seconds (ref 11.6–15.2)

## 2013-05-19 LAB — COMPREHENSIVE METABOLIC PANEL
AST: 41 U/L — ABNORMAL HIGH (ref 0–37)
Albumin: 4.1 g/dL (ref 3.5–5.2)
Calcium: 9.6 mg/dL (ref 8.4–10.5)
Chloride: 93 mEq/L — ABNORMAL LOW (ref 96–112)
Creatinine, Ser: 1.28 mg/dL — ABNORMAL HIGH (ref 0.50–1.10)
Total Protein: 7.2 g/dL (ref 6.0–8.3)

## 2013-05-19 NOTE — Progress Notes (Signed)
[facility] Progress note  [name] [id number] DOB: 05-17-1946 DOA: 05/14/2013  Referring physician:  PCP: Ria Bush, MD  Specialists:   Chief Complaint: Increasing DOE, fatigue  HPI: [name] is a 67 y.o. WF PMHx CHF, mitral regurg, cardiac arrhythmia, atrial fibrillation, HTN, HLD, hypothyroidism. She was seen in the emergency Department 5 days ago with vomiting and diarrhea. Which she said resolved by Tuesday. States Over the last several days, she has noted dyspnea with exertion when she walks as little as 4 feet, positive fatigue, negative chest pain, negative firing of her defibrillator (St. Jude single chamber ICD ), positive anorexia, slightly elevated temperature measured at 99.3, right lower extremity swelling, bilateral lower extremity coldness. There's been a cough which is intermittently productive of yellowish sputum. Nothing makes symptoms better nothing makes it worse. States she had a cardiac echo in 2013 but does not recall results states cardiac defibrillator placed 2010. Sees 3 cardiologist @  [facility] ( Dr Barbaraann Boys, Dr taylor, Dr Harrington Challenger). TODAY S/P day 4 catheterization by Dr. Charolette Forward (cardiology). Patient will require mitral valve repair /replacement by Dr Nils Pyle (CT Surgeon), patient resting comfortably  Procedure;   CT angiogram of chest PE protocol 05/15/2013 1. Technically adequate study without pulmonary embolism.  2. Cardiomegaly, pleural effusions, pulmonary edema are all  consistent with CHF.   Cardiac catheterization 05/15/2013    Past Medical History  Diagnosis Date  . Arthritis   . Hypertension   . A-fib   . High cholesterol   . Hypothyroidism   . Depression   . Shortness of breath     exertion  . Pacemaker     icd  . CHF (congestive heart failure)   . GERD (gastroesophageal reflux disease)   . [id number])    Past Surgical History  Procedure Laterality Date  . Cardiac defibrillator placement    .  Abdominal hysterectomy    . Hernia repair  as child    x 2  . Tee without cardioversion  09/24/2012    Procedure: TRANSESOPHAGEAL ECHOCARDIOGRAM (TEE);  Surgeon: Fay Records, MD;  Location: [facility] ENDOSCOPY;  Service: Cardiovascular;  Laterality: N/A;   Social History:  reports that she has never smoked. She has never used smokeless tobacco. She reports that she does not drink alcohol or use illicit drugs.   Allergies  Allergen Reactions  . Codeine Nausea And Vomiting  . Meperidine Hcl Nausea And Vomiting    History reviewed. No pertinent family history.   Prior to Admission medications   Medication Sig Start Date End Date Taking? Authorizing Provider  acetaminophen (TYLENOL) 500 MG tablet Take 1,000 mg by mouth every 6 (six) hours as needed. For pain   Yes Historical Provider, MD  aspirin EC 325 MG tablet Take 325 mg by mouth every other day.   Yes Historical Provider, MD  Aspirin-Acetaminophen-Caffeine (GOODY HEADACHE PO) Take 1 packet by mouth as needed. For pain   Yes Historical Provider, MD  diazepam (VALIUM) 2 MG tablet Take 2 mg by mouth 3 (three) times daily. For anxiety   Yes Historical Provider, MD  fish oil-omega-3 fatty acids 1000 MG capsule Take 1 g by mouth daily.   Yes Historical Provider, MD  furosemide (LASIX) 40 MG tablet Take 2 tablets (80 mg total) by mouth daily. 10/24/12  Yes Evans Lance, MD  hydrOXYzine (ATARAX/VISTARIL) 10 MG tablet Take 10 mg by mouth at bedtime.    Yes Historical Provider, MD  levothyroxine (SYNTHROID, LEVOTHROID) 88 MCG tablet  Take 88 mcg by mouth daily.   Yes Historical Provider, MD  lisinopril (PRINIVIL,ZESTRIL) 10 MG tablet Take 10 mg by mouth 2 (two) times daily.   Yes Historical Provider, MD  metoprolol succinate (TOPROL-XL) 50 MG 24 hr tablet Take 50 mg by mouth daily. Take with or immediately following a meal.   Yes Historical Provider, MD  pantoprazole (PROTONIX) 40 MG tablet Take 40 mg by mouth daily.   Yes Historical Provider, MD   PARoxetine (PAXIL) 30 MG tablet Take 60 mg by mouth daily.    Yes Historical Provider, MD  Polyvinyl Alcohol-Povidone (MURINE TEARS FOR DRY EYES OP) Place 1 drop into both eyes as needed. For dry eyes   Yes Historical Provider, MD  potassium chloride SA (KLOR-CON M20) 20 MEQ tablet Take 1 tablet (20 mEq total) by mouth daily. 07/03/12  Yes Evans Lance, MD  simvastatin (ZOCOR) 40 MG tablet Take 0.5 tablets (20 mg total) by mouth every evening. 09/20/12  Yes Evans Lance, MD  ondansetron (ZOFRAN) 4 MG tablet Take 1 tablet (4 mg total) by mouth every 6 (six) hours. 05/09/13   Rhunette Croft, MD   Physical Exam: Filed Vitals:   05/18/13 1300 05/18/13 2113 05/19/13 0500 05/19/13 0933  BP: 113/69 103/61 110/70 94/62  Pulse: 66 68 68   Temp: 97.9 F (36.6 C) 98.4 F (36.9 C) 98.2 F (36.8 C)   TempSrc: Oral Oral Oral   Resp: 18 18 18    Height:      Weight:   67.4 kg (148 lb 9.4 oz)   SpO2: 92% 98% 95%      General: Alert, NAD  Cardiovascular: Regular rhythm and rate, grade 4 holosystolic murmur, negative rubs or gallops, PD/TP pulses 2+ bilateral  Respiratory: Clear to auscultation bilaterally  Abdomen: Soft nontender plus bowel sounds    Labs on Admission:  Basic Metabolic Panel:  Recent Labs Lab 05/15/13 1050 05/16/13 0540 05/17/13 0525 05/18/13 0545 05/19/13 0555  NA 136 135 140 135 134*  K 3.9 4.3 3.8 3.7 4.0  CL 96 99 97 91* 93*  CO2 25 22 29 28 27   GLUCOSE 102* 108* 103* 100* 125*  BUN 17 23 19  24* 27*  CREATININE 1.15* 1.18* 1.25* 1.19* 1.28*  CALCIUM 9.5 9.2 9.5 9.6 9.6  MG 2.4  --   --   --   --    Liver Function Tests:  Recent Labs Lab 05/15/13 1050 05/16/13 0540 05/17/13 0525 05/18/13 0545 05/19/13 0555  AST 75* 59* 53* 45* 41*  ALT 83* 70* 62* 52* 46*  ALKPHOS 67 68 71 74 100  BILITOT 1.1 0.9 0.7 0.7 0.4  PROT 6.6 6.4 7.2 7.1 7.2  ALBUMIN 4.1 4.0 4.2 4.2 4.1    Recent Labs Lab 05/14/13 2325  LIPASE 19   No results found for this  basename: AMMONIA,  in the last 168 hours CBC:  Recent Labs Lab 05/14/13 2325 05/15/13 1050  WBC 5.8 7.0  NEUTROABS 4.5 5.0  HGB 9.7* 9.8*  HCT 28.8* 28.8*  MCV 98.6 98.0  PLT 236 265   Cardiac Enzymes:  Recent Labs Lab 05/14/13 2325 05/15/13 1050 05/15/13 1502 05/15/13 2033  TROPONINI <0.30 <0.30 <0.30 <0.30    BNP (last 3 results)  Recent Labs  05/15/13 0200 05/16/13 0540  PROBNP 13764.0* 15859.0*   CBG:  Recent Labs Lab 05/18/13 2145  GLUCAP 162*    Radiological Exams on Admission: No results found.  EKG: Pending  Assessment/Plan Principal Problem:  Chronic systolic heart failure Active Problems:   HYPOTHYROIDISM   HYPERLIPIDEMIA   HYPERTENSION   CARDIAC ARRHYTHMIA   Mitral regurgitation   Anxiety state, unspecified   Atrial fibrillation   1. CHF; patient unsure what her baseline weight is current weight is 153 pounds,. We'll continue to diuresis patient. Continue Strict I/O and QD Weights. BNP= 15859 2. Atrial fibrillation; currently not in A-Fib. Continue to hold Coumadin 2dary to possible surgery. Start patient on SCD for DVT  Prophylaxis 3.  HLD; patient's cholesterol level within NCEP guidelines 4. Hypothyroidism; TSH= 9.99(high), free T4= 1.55(NL), patient therefore meets criteria for sub clinical hypothyroidism will increase her Synthroid to 100 mcg daily 5. Mitral Valve Regurgitation; Dr Darcey Nora (CTSurgery) has been contacted by Dr.Mohan Ocshner [facility] (cardiology), . And we'll see patient and determine the plan of care concerning repair/replacement of mitral. NOTE spoke with Dr.Van Trigt office and they have schedule patient for an inpatient dental consult prior to surgery which is scheduled for next week (Thursday)   Code Status: Full Family Communication:  Disposition Plan: Per cardiology/CT surgery  Time spent: 45 minutes  Allie Bossier [facility] Pager [phone]  If 7PM-7AM, please contact  night-coverage [email or web address] Password [facility] 05/19/2013, 12:39 PM

## 2013-05-20 DIAGNOSIS — I059 Rheumatic mitral valve disease, unspecified: Secondary | ICD-10-CM

## 2013-05-20 DIAGNOSIS — Z95811 Presence of heart assist device: Secondary | ICD-10-CM

## 2013-05-20 LAB — COMPREHENSIVE METABOLIC PANEL
ALT: 37 U/L — ABNORMAL HIGH (ref 0–35)
Albumin: 3.8 g/dL (ref 3.5–5.2)
Alkaline Phosphatase: 78 U/L (ref 39–117)
Calcium: 9.4 mg/dL (ref 8.4–10.5)
Potassium: 4 mEq/L (ref 3.5–5.1)
Sodium: 135 mEq/L (ref 135–145)
Total Protein: 6.7 g/dL (ref 6.0–8.3)

## 2013-05-20 LAB — PROTIME-INR: Prothrombin Time: 13 seconds (ref 11.6–15.2)

## 2013-05-20 MED ORDER — ALPRAZOLAM 0.25 MG PO TABS: 0.2500 mg | ORAL_TABLET | ORAL | Status: DC | PRN

## 2013-05-20 MED ORDER — CEFAZOLIN SODIUM-DEXTROSE 2-3 GM-% IV SOLR
2.0000 g | Freq: Once | INTRAVENOUS | Status: AC
Start: 2013-05-21 — End: 2013-05-21
  Administered 2013-05-21: 2 g via INTRAVENOUS
  Filled 2013-05-20 (×2): qty 50

## 2013-05-20 NOTE — Progress Notes (Signed)
1130 am sound asleep respiration easy and regular  1230 no apparent distress this time . No complaint presented

## 2013-05-20 NOTE — Progress Notes (Signed)
4 Days Post-Op Procedure(s) (LRB): LEFT HEART CATHETERIZATION WITH CORONARY ANGIOGRAM (N/A) Subjective: Severe mitral regurgitation with CHF Carotid Doppler show no significant disease PFTs with moderate obstruction FEV1 1.8, FVC 2.2, DLCO 48%  Appreciate dental evaluation and plan for dental therapy Will allow 2 days of healing and reschedule mitral valve repair-replacement for Friday, August 8. Plan discussed with patient  Objective: Vital signs in last 24 hours: Temp:  [98 F (36.7 C)-98.6 F (37 C)] 98 F (36.7 C) (08/04 1300) Pulse Rate:  [64-68] 64 (08/04 1300) Cardiac Rhythm:  [-] Normal sinus rhythm (08/03 2045) Resp:  [20] 20 (08/04 1300) BP: (107-122)/(56-73) 112/70 mmHg (08/04 1300) SpO2:  [95 %-98 %] 96 % (08/04 1300) Weight:  [149 lb 11.1 oz (67.9 kg)] 149 lb 11.1 oz (67.9 kg) (08/04 0557)  Hemodynamic parameters for last 24 hours:   sinus rhythm  Intake/Output from previous day: 08/03 0701 - 08/04 0700 In: 700 [P.O.:700] Out: 1200 [Urine:1200] Intake/Output this shift:    Holosystolic murmur to left axilla unchanged Lungs clear  Lab Results: No results found for this basename: WBC, HGB, HCT, PLT,  in the last 72 hours BMET:  Recent Labs  05/19/13 0555 05/20/13 0400  NA 134* 135  K 4.0 4.0  CL 93* 95*  CO2 27 28  GLUCOSE 125* 109*  BUN 27* 33*  CREATININE 1.28* 1.40*  CALCIUM 9.6 9.4    PT/INR:  Recent Labs  05/20/13 0400  LABPROT 13.0  INR 1.00   ABG    Component Value Date/Time   PHART 7.356 09/06/2009 0420   HCO3 19.8* 09/06/2009 0420   TCO2 20.9 09/06/2009 0420   ACIDBASEDEF 4.7* 09/06/2009 0420   O2SAT 94.8 09/06/2009 0420   CBG (last 3)   Recent Labs  05/18/13 2145  GLUCAP 162*    Assessment/Plan: S/P Procedure(s) (LRB): LEFT HEART CATHETERIZATION WITH CORONARY ANGIOGRAM (N/A) Mitral valve repair-replacement Friday, August 8   LOS: 6 days    [name],PETER 05/20/2013

## 2013-05-20 NOTE — Progress Notes (Signed)
[facility] Progress note  Markesia Kozal [id number] DOB: 03-14-1946 DOA: 05/14/2013  Referring physician:  PCP: Ria Bush, MD  Specialists:   Chief Complaint: Increasing DOE, fatigue  HPI: [name] is a 67 y.o. WF PMHx CHF, mitral regurg, cardiac arrhythmia, atrial fibrillation, HTN, HLD, hypothyroidism. She was seen in the emergency Department 5 days ago with vomiting and diarrhea. Which she said resolved by Tuesday. States Over the last several days, she has noted dyspnea with exertion when she walks as little as 4 feet, positive fatigue, negative chest pain, negative firing of her defibrillator (St. Jude single chamber ICD ), positive anorexia, slightly elevated temperature measured at 99.3, right lower extremity swelling, bilateral lower extremity coldness. There's been a cough which is intermittently productive of yellowish sputum. Nothing makes symptoms better nothing makes it worse. States she had a cardiac echo in 2013 but does not recall results states cardiac defibrillator placed 2010. Sees 3 cardiologist @  [facility] ( Dr Barbaraann Boys, Dr taylor, Dr Harrington Challenger). TODAY S/P day 5 catheterization by Dr. Charolette Forward (cardiology). Patient will require mitral valve repair /replacement by Dr Nils Pyle ([place] Surgeon). Dr. Jori MollEnrique Sack dentist right away patient this morning for presurgery clearance of mitral valve repair/replacement. Plan of care is as follows:  extraction of indicated teeth with alveoloplasty and gross debridement of remaining dentition the operating room with general anesthesia on 05/21/2013 at 7:30 AM. Patient agrees to proceed with extraction of tooth numbers 2, 4, and 18 and other teeth as needed. The patient will then proceed with anticipated mitral valve replacement with Dr. Prescott Gum as per his discretion. The patient will need to follow up with the dentist of her choice for routine periodontal therapy and evaluation for replacement of missing teeth as  indicated once medically stable from the anticipated heart valve surgery . The patient will then require antibiotic premedication prior to invasive dental procedures as per American heart association guidelines.   Procedure;   CT angiogram of chest PE protocol 05/15/2013 1. Technically adequate study without pulmonary embolism.  2. Cardiomegaly, pleural effusions, pulmonary edema are all  consistent with CHF.   Cardiac catheterization 05/15/2013    Past Medical History  Diagnosis Date  . Arthritis   . Hypertension   . A-fib   . High cholesterol   . Hypothyroidism   . Depression   . Shortness of breath     exertion  . Pacemaker     icd  . CHF (congestive heart failure)   . GERD (gastroesophageal reflux disease)   . [id number])    Past Surgical History  Procedure Laterality Date  . Cardiac defibrillator placement    . Abdominal hysterectomy    . Hernia repair  as child    x 2  . Tee without cardioversion  09/24/2012    Procedure: TRANSESOPHAGEAL ECHOCARDIOGRAM (TEE);  Surgeon: Fay Records, MD;  Location: [facility] ENDOSCOPY;  Service: Cardiovascular;  Laterality: N/A;   Social History:  reports that she has never smoked. She has never used smokeless tobacco. She reports that she does not drink alcohol or use illicit drugs.   Allergies  Allergen Reactions  . Codeine Nausea And Vomiting  . Meperidine Hcl Nausea And Vomiting    History reviewed. No pertinent family history.   Prior to Admission medications   Medication Sig Start Date End Date Taking? Authorizing Provider  acetaminophen (TYLENOL) 500 MG tablet Take 1,000 mg by mouth every 6 (six) hours as needed. For pain   Yes Historical  Provider, MD  aspirin EC 325 MG tablet Take 325 mg by mouth every other day.   Yes Historical Provider, MD  Aspirin-Acetaminophen-Caffeine (GOODY HEADACHE PO) Take 1 packet by mouth as needed. For pain   Yes Historical Provider, MD  diazepam (VALIUM) 2 MG tablet Take 2 mg by mouth 3  (three) times daily. For anxiety   Yes Historical Provider, MD  fish oil-omega-3 fatty acids 1000 MG capsule Take 1 g by mouth daily.   Yes Historical Provider, MD  furosemide (LASIX) 40 MG tablet Take 2 tablets (80 mg total) by mouth daily. 10/24/12  Yes Evans Lance, MD  hydrOXYzine (ATARAX/VISTARIL) 10 MG tablet Take 10 mg by mouth at bedtime.    Yes Historical Provider, MD  levothyroxine (SYNTHROID, LEVOTHROID) 88 MCG tablet Take 88 mcg by mouth daily.   Yes Historical Provider, MD  lisinopril (PRINIVIL,ZESTRIL) 10 MG tablet Take 10 mg by mouth 2 (two) times daily.   Yes Historical Provider, MD  metoprolol succinate (TOPROL-XL) 50 MG 24 hr tablet Take 50 mg by mouth daily. Take with or immediately following a meal.   Yes Historical Provider, MD  pantoprazole (PROTONIX) 40 MG tablet Take 40 mg by mouth daily.   Yes Historical Provider, MD  PARoxetine (PAXIL) 30 MG tablet Take 60 mg by mouth daily.    Yes Historical Provider, MD  Polyvinyl Alcohol-Povidone (MURINE TEARS FOR DRY EYES OP) Place 1 drop into both eyes as needed. For dry eyes   Yes Historical Provider, MD  potassium chloride SA (KLOR-CON M20) 20 MEQ tablet Take 1 tablet (20 mEq total) by mouth daily. 07/03/12  Yes Evans Lance, MD  simvastatin (ZOCOR) 40 MG tablet Take 0.5 tablets (20 mg total) by mouth every evening. 09/20/12  Yes Evans Lance, MD  ondansetron (ZOFRAN) 4 MG tablet Take 1 tablet (4 mg total) by mouth every 6 (six) hours. 05/09/13   Rhunette Croft, MD   Physical Exam: Filed Vitals:   05/19/13 2100 05/19/13 2123 05/20/13 0557 05/20/13 0931  BP: 122/70 114/65 113/73 107/56  Pulse: 64 65 68 66  Temp:  98.4 F (36.9 C) 98.6 F (37 C)   TempSrc:  Oral Oral   Resp:  20 20   Height:      Weight:   67.9 kg (149 lb 11.1 oz)   SpO2:  98% 95%      General: Alert, NAD  Cardiovascular: Regular rhythm and rate, grade 4 holosystolic murmur, negative rubs or gallops, PD/TP pulses 2+ bilateral  Respiratory: Clear to  auscultation bilaterally  Abdomen: Soft nontender plus bowel sounds    Labs on Admission:  Basic Metabolic Panel:  Recent Labs Lab 05/15/13 1050 05/16/13 0540 05/17/13 0525 05/18/13 0545 05/19/13 0555 05/20/13 0400  NA 136 135 140 135 134* 135  K 3.9 4.3 3.8 3.7 4.0 4.0  CL 96 99 97 91* 93* 95*  CO2 25 22 29 28 27 28   GLUCOSE 102* 108* 103* 100* 125* 109*  BUN 17 23 19  24* 27* 33*  CREATININE 1.15* 1.18* 1.25* 1.19* 1.28* 1.40*  CALCIUM 9.5 9.2 9.5 9.6 9.6 9.4  MG 2.4  --   --   --   --   --    Liver Function Tests:  Recent Labs Lab 05/16/13 0540 05/17/13 0525 05/18/13 0545 05/19/13 0555 05/20/13 0400  AST 59* 53* 45* 41* 35  ALT 70* 62* 52* 46* 37*  ALKPHOS 68 71 74 100 78  BILITOT 0.9 0.7 0.7 0.4 0.3  PROT 6.4 7.2 7.1 7.2 6.7  ALBUMIN 4.0 4.2 4.2 4.1 3.8    Recent Labs Lab 05/14/13 2325  LIPASE 19   No results found for this basename: AMMONIA,  in the last 168 hours CBC:  Recent Labs Lab 05/14/13 2325 05/15/13 1050  WBC 5.8 7.0  NEUTROABS 4.5 5.0  HGB 9.7* 9.8*  HCT 28.8* 28.8*  MCV 98.6 98.0  PLT 236 265   Cardiac Enzymes:  Recent Labs Lab 05/14/13 2325 05/15/13 1050 05/15/13 1502 05/15/13 2033  TROPONINI <0.30 <0.30 <0.30 <0.30    BNP (last 3 results)  Recent Labs  05/15/13 0200 05/16/13 0540  PROBNP 13764.0* 15859.0*   CBG:  Recent Labs Lab 05/18/13 2145  GLUCAP 162*    Radiological Exams on Admission: No results found.  EKG: Pending  Assessment/Plan Principal Problem:   Chronic systolic heart failure Active Problems:   HYPOTHYROIDISM   HYPERLIPIDEMIA   HYPERTENSION   CARDIAC ARRHYTHMIA   Mitral regurgitation   Anxiety state, unspecified   Atrial fibrillation   1. CHF; patient unsure what her baseline weight is current weight is 153 pounds,. We'll continue to diuresis patient. Continue Strict I/O and QD Weights. BNP= 15859 2. Atrial fibrillation; currently not in A-Fib. Continue to hold Coumadin 2dary to  possible surgery. Start patient on SCD for DVT  Prophylaxis 3.  HLD; patient's cholesterol level within NCEP guidelines 4. Hypothyroidism; TSH= 9.99(high), free T4= 1.55(NL), patient therefore meets criteria for sub clinical hypothyroidism continue her Synthroid to 100 mcg daily Mitral Valve Regurgitation; Dr Darcey Nora (CTSurgery) has been contacted by Dr.Mohan [facility] (cardiology), . And we'll see patient and determine the plan of care concerning repair/replacement of mitral. NOTE Dr. Jori Moll: [facility] dentist saw patient this morning for presurgery clearance of mitral valve repair/replacement. Plan of care is as follows:  extraction of indicated teeth with alveoloplasty and gross debridement of remaining dentition the operating room with general anesthesia on 05/21/2013 at 7:30 AM.  Code Status: Full Family Communication:  Disposition Plan: Per cardiology/CT surgery  Time spent: 45 minutes  Allie Bossier [facility] Pager [phone]  If 7PM-7AM, please contact night-coverage [email or web address] Password [email or web address] 05/20/2013, 11:16 AM

## 2013-05-20 NOTE — Progress Notes (Signed)
[id number] Offered to walk with pt. She said she walked earlier and does not want to walk right now. States will walk again later. Pt states she has watched preop video and has OHS booklet. Had pt show me use of IS. Needed assistance but can get to 2250 ml. Discussed with pt importance of mobility after surgery. When discussing sternal precautions after breast bone cut, she said I wish you did not tell me that. Demonstrated to pt getting up and down without using arms. Encouraged pt to walk with staff in preparation of surgery. Will follow up after surgery. [place]

## 2013-05-20 NOTE — Progress Notes (Signed)
Lanagan in with orders

## 2013-05-20 NOTE — Consult Note (Addendum)
05/20/2013  DENTAL CONSULTATION  Date of Consultation:  05/20/2013 Patient Name:   [name] Date of Birth:   07-Jan-1946 Medical Record Number: [id number]  VITALS: BP 113/73  Pulse 68  Temp(Src) 98.6 F (37 C) (Oral)  Resp 20  Ht 5\' 4"  (1.626 m)  Wt 149 lb 11.1 oz (67.9 kg)  BMI 25.68 kg/m2  SpO2 95%   CHIEF COMPLAINT: Patient referred for a medically necessary pre-heart valve surgery dental protocol consultation by Dr. Tharon Aquas Trigt.   HPI: Patient with recent identification of severe mitral regurgitation. The patient with anticipated mitral valve replacement or repair as needed. Patient is now seen as part of a pre-heart valve surgery dental protocol to rule out dental infection that may affect the patient's systemic health and the anticipated heart valve surgery.  The patient currently denies acute toothache, swellings, or abscesses.  The patient indicates that she has an upper right quadrant tooth that broke off several months ago but this does not hurt her. Patient last seen by the primary dentist  "awhile ago".  The patient indicates that her last dentist, Dr. Mancel Bale in [place], [place] retired aproximately 3 years ago. She has not been seen since then. Patient had a dental cleaning approximately that same time. The patient has no partial dentures.  Patient Active Problem List   Diagnosis Date Noted  . Anxiety state, unspecified 05/15/2013  . Atrial fibrillation 05/15/2013  . Mitral regurgitation 09/20/2012  . Chronic systolic heart failure [id number]  . ICD-St.Jude 04/20/2010  . HYPOTHYROIDISM 12/28/2009  . HYPERLIPIDEMIA 12/28/2009  . HYPERTENSION 12/28/2009  . CARDIAC ARRHYTHMIA 12/28/2009  . CHF 12/28/2009  . IBS 12/28/2009    PMH: Past Medical History  Diagnosis Date  . Arthritis   . Hypertension   . A-fib   . High cholesterol   . Hypothyroidism   . Depression   . Shortness of breath     exertion  . Pacemaker     icd  . CHF (congestive heart failure)    . GERD (gastroesophageal reflux disease)   . Headache(784.0)     PSH: Past Surgical History  Procedure Laterality Date  . Cardiac defibrillator placement    . Abdominal hysterectomy    . Hernia repair  as child    x 2  . Tee without cardioversion  09/24/2012    Procedure: TRANSESOPHAGEAL ECHOCARDIOGRAM (TEE);  Surgeon: Fay Records, MD;  Location: [facility] ENDOSCOPY;  Service: Cardiovascular;  Laterality: N/A;    ALLERGIES: Allergies  Allergen Reactions  . Codeine Nausea And Vomiting  . Meperidine Hcl Nausea And Vomiting    MEDICATIONS: Current Facility-Administered Medications  Medication Dose Route Frequency Provider Last Rate Last Dose  . 0.9 %  sodium chloride infusion  250 mL Intravenous PRN Allie Bossier, MD      . acetaminophen (TYLENOL) tablet 650 mg  650 mg Oral Q4H PRN Allie Bossier, MD   650 mg at 05/18/13 2229  . alum & mag hydroxide-simeth (MAALOX/MYLANTA) 200-200-20 MG/5ML suspension 30 mL  30 mL Oral Q4H PRN Allie Bossier, MD   30 mL at 05/18/13 1459  . aspirin EC tablet 325 mg  325 mg Oral Daily Allie Bossier, MD   325 mg at 05/19/13 0932  . diazepam (VALIUM) tablet 2 mg  2 mg Oral TID PRN Allie Bossier, MD   2 mg at 05/15/13 2333  . feeding supplement (ENSURE COMPLETE) liquid 237 mL  237 mL Oral BID BM Dagmar Hait, RD  237 mL at 05/18/13 1032  . feeding supplement (ENSURE) pudding 1 Container  1 Container Oral TID WC Ivin Poot, MD   1 Container at 05/20/13 0700  . furosemide (LASIX) injection 80 mg  80 mg Intravenous Once Allie Bossier, MD      . furosemide (LASIX) tablet 80 mg  80 mg Oral Daily Allie Bossier, MD   80 mg at 05/19/13 0931  . guaiFENesin-dextromethorphan (ROBITUSSIN DM) 100-10 MG/5ML syrup 5 mL  5 mL Oral Q4H PRN Allie Bossier, MD   5 mL at 05/17/13 2147  . hydrOXYzine (ATARAX/VISTARIL) tablet 10 mg  10 mg Oral QHS Allie Bossier, MD   10 mg at 05/19/13 2300  . levothyroxine (SYNTHROID, LEVOTHROID) tablet 100 mcg  100 mcg Oral QAC  breakfast Allie Bossier, MD   100 mcg at 05/20/13 [id number]  . lisinopril (PRINIVIL,ZESTRIL) tablet 20 mg  20 mg Oral BID Allie Bossier, MD   20 mg at 05/19/13 2110  . metoprolol succinate (TOPROL-XL) 24 hr tablet 50 mg  50 mg Oral Daily Allie Bossier, MD   50 mg at 05/19/13 0933  . ondansetron (ZOFRAN) injection 4 mg  4 mg Intravenous Q6H PRN Allie Bossier, MD      . pantoprazole (PROTONIX) EC tablet 40 mg  40 mg Oral Daily Allie Bossier, MD   40 mg at 05/19/13 0931  . PARoxetine (PAXIL) tablet 60 mg  60 mg Oral Daily Allie Bossier, MD   60 mg at 05/19/13 0931  . polyvinyl alcohol (LIQUIFILM TEARS) 1.4 % ophthalmic solution 1 drop  1 drop Both Eyes QID PRN Allie Bossier, MD   1 drop at 05/16/13 1334  . sodium chloride 0.9 % injection 3 mL  3 mL Intravenous Q12H Allie Bossier, MD   3 mL at 05/19/13 2115  . sodium chloride 0.9 % injection 3 mL  3 mL Intravenous PRN Allie Bossier, MD      . sodium chloride 0.9 % injection 3 mL  3 mL Intravenous PRN Allie Bossier, MD        LABS: Lab Results  Component Value Date   WBC 7.0 05/15/2013   HGB 9.8* 05/15/2013   HCT 28.8* 05/15/2013   MCV 98.0 05/15/2013   PLT 265 05/15/2013      Component Value Date/Time   NA 135 05/20/2013 0400   K 4.0 05/20/2013 0400   CL 95* 05/20/2013 0400   CO2 28 05/20/2013 0400   GLUCOSE 109* 05/20/2013 0400   BUN 33* 05/20/2013 0400   CREATININE 1.40* 05/20/2013 0400   CALCIUM 9.4 05/20/2013 0400   GFRNONAA 38* 05/20/2013 0400   GFRAA 45* 05/20/2013 0400   Lab Results  Component Value Date   INR 1.00 05/20/2013   INR 0.89 05/19/2013   INR 1.04 05/18/2013   No results found for this basename: PTT    SOCIAL HISTORY: History   Social History  . Marital Status: Widowed    Spouse Name: N/A    Number of Children: N/A  . Years of Education: N/A   Occupational History  . Not on file.   Social History Main Topics  . Smoking status: Never Smoker   . Smokeless tobacco: Never Used  . Alcohol Use: No  . Drug Use: No  . Sexually  Active: Not on file   Other Topics Concern  . Not on file   Social History Narrative  . No narrative on  file    FAMILY HISTORY: History reviewed. No pertinent family history.   REVIEW OF SYSTEMS: Reviewed from echart for this admission.  DENTAL HISTORY: CHIEF COMPLAINT: Patient referred for a medically necessary pre-heart valve surgery dental protocol consultation by Dr. Tharon Aquas trigt.   HPI: Patient with recent identification of severe mitral regurgitation. The patient with anticipated mitral valve replacement or repair as needed. Patient is now seen as part of a pre-heart valve surgery dental protocol to rule out dental infection that may affect the patient's systemic health and the anticipated heart valve surgery.  The patient currently denies acute toothache, swellings, or abscesses.  The patient indicates that she has an upper right quadrant tooth that broke off several months ago but this does not hurt her. Patient last seen by the primary dentist  "awhile ago".  The patient indicates that her last dentist, Dr. Mancel Bale in [place], [place] retired aproximately 3 years ago. She has not been seen since then. Patient had a dental cleaning approximately that same time. The patient has no partial dentures.  DENTAL EXAMINATION:  GENERAL: The patient is a well-developed, well-nourished female in no acute distress. HEAD AND NECK: There is no palpable submandibular lymphadenopathy. The patient denies acute TMJ symptoms. INTRAORAL EXAM: The patient has normal saliva. There is no evidence of abscess formation. DENTITION: The patient is missing tooth numbers 1, 13, 19, 20, and 32. The patient has a retained root segment in the area of #4. PERIODONTAL: The patient has chronic periodontitis with plaque and calculus accumulations, generalized gingival recession and incipient tooth mobility. DENTAL CARIES/SUBOPTIMAL RESTORATIONS: There are dental caries and suboptimal dental restorations noted.  The caries on tooth #18 appears to be impinging on the pulp. ENDODONTIC: The patient currently denies acute pulpitis symptoms. I am unable to detect any periapical pathology at this time. CROWN AND BRIDGE: There are crown restorations associated with tooth numbers 7, 8, 9, and 10 that appear to be acceptable. PROSTHODONTIC: There are no partial dentures. OCCLUSION: Patient has a poor occlusal scheme secondary to multiple missing teeth, retained root segments, and lack of replacement of all missing teeth with dental prostheses.  RADIOGRAPHIC INTERPRETATION: An orthopantogram was initially taken on 05/16/2013 and was retaken on 05/17/2013 due to patient movement. The radiographs are still suboptimal. There are multiple missing teeth. There are dental caries noted. There is supra-eruption and drifting of the unopposed teeth into the edentulous areas.  There is no obvious periapical radiolucency noted. There are multiple dental restorations noted. There are no obvious periapical radiolucencies noted on the orthopantogram.  ASSESSMENTS: 1. Chronic periodontitis with bone loss 2. Gingival recession 3. Plaque and calculus accumulations 4.  Incipient tooth mobility 5. Multiple missing teeth 6. Multiple diastemas 7. Supra-eruption and drifting of the unopposed teeth into the edentulous areas 8. Dental caries and suboptimal dental restorations are noted 9. Poor occlusal scheme and malocclusion 10. Bilateral mandibular lingual tori  11. History of oral neglect  PLAN/RECOMMENDATIONS: 1. I discussed the risks, benefits, and complications of various treatment options with the patient in relationship to the medical and dental conditions, anticipated heart valve surgery, and risk for endocarditis. We discussed various treatment options to include no treatment, multiple extractions with alveoloplasty, pre-prosthetic surgery as indicated, periodontal therapy, dental restorations, root canal therapy, crown and  bridge therapy, implant therapy, and replacement of missing teeth as indicated. The patient currently wishes to proceed with extraction of indicated teeth with alveoloplasty and gross debridement of remaining dentition the operating room with general anesthesia  on 05/21/2013 at 7:30 AM.  Patient agrees to proceed with extraction of tooth numbers 2, 4, and 18 and other teeth as needed. The patient will then proceed with anticipated mitral valve replacement with Dr. Prescott Gum as per his discretion.  The patient will need to follow up with the dentist of her choice for routine periodontal therapy and evaluation for replacement of missing teeth as indicated once medically stable from the anticipated heart valve surgery .  The patient will then require antibiotic premedication prior to invasive dental procedures as per American heart association guidelines.   2. Discussion of findings with medical team and coordination of future medical and dental care as indicated.  Lenn Cal, DDS

## 2013-05-21 ENCOUNTER — Ambulatory Visit: Payer: Medicare Other | Admitting: Family Medicine

## 2013-05-21 ENCOUNTER — Encounter (HOSPITAL_COMMUNITY)
Admission: EM | Disposition: A | Discharge status: Home Health | Payer: Self-pay | Source: Home / Self Care | Attending: Cardiothoracic Surgery

## 2013-05-21 ENCOUNTER — Encounter (HOSPITAL_COMMUNITY): Payer: Self-pay | Admitting: Anesthesiology

## 2013-05-21 ENCOUNTER — Inpatient Hospital Stay (HOSPITAL_COMMUNITY): Payer: Medicare Other | Admitting: Anesthesiology

## 2013-05-21 DIAGNOSIS — K036 Deposits [accretions] on teeth: Secondary | ICD-10-CM

## 2013-05-21 DIAGNOSIS — K0530 Chronic periodontitis, unspecified: Secondary | ICD-10-CM

## 2013-05-21 DIAGNOSIS — K083 Retained dental root: Secondary | ICD-10-CM | POA: Diagnosis present

## 2013-05-21 DIAGNOSIS — I059 Rheumatic mitral valve disease, unspecified: Secondary | ICD-10-CM

## 2013-05-21 DIAGNOSIS — K029 Dental caries, unspecified: Secondary | ICD-10-CM

## 2013-05-21 HISTORY — PX: MULTIPLE EXTRACTIONS WITH ALVEOLOPLASTY: SHX5342

## 2013-05-21 LAB — PROTIME-INR: Prothrombin Time: 12.8 seconds (ref 11.6–15.2)

## 2013-05-21 LAB — COMPREHENSIVE METABOLIC PANEL
ALT: 33 U/L (ref 0–35)
Albumin: 4 g/dL (ref 3.5–5.2)
Alkaline Phosphatase: 80 U/L (ref 39–117)
BUN: 38 mg/dL — ABNORMAL HIGH (ref 6–23)
Chloride: 94 mEq/L — ABNORMAL LOW (ref 96–112)
Glucose, Bld: 101 mg/dL — ABNORMAL HIGH (ref 70–99)
Potassium: 5.1 mEq/L (ref 3.5–5.1)
Sodium: 135 mEq/L (ref 135–145)
Total Bilirubin: 0.3 mg/dL (ref 0.3–1.2)

## 2013-05-21 LAB — GLUCOSE, CAPILLARY: Glucose-Capillary: 88 mg/dL (ref 70–99)

## 2013-05-21 SURGERY — MULTIPLE EXTRACTION WITH ALVEOLOPLASTY
Anesthesia: General | Site: Mouth | Wound class: Clean Contaminated

## 2013-05-21 MED ORDER — DEXTROSE 5 % IV SOLN
INTRAVENOUS | Status: DC | PRN
  Administered 2013-05-21: 08:00:00 via INTRAVENOUS

## 2013-05-21 MED ORDER — SUCCINYLCHOLINE CHLORIDE 20 MG/ML IJ SOLN
INTRAMUSCULAR | Status: DC | PRN
  Administered 2013-05-21: 90 mg via INTRAVENOUS

## 2013-05-21 MED ORDER — HYDROMORPHONE HCL PF 1 MG/ML IJ SOLN
1.0000 mg | INTRAMUSCULAR | Status: DC | PRN
  Administered 2013-05-21 – 2013-05-22 (×3): 1 mg via INTRAVENOUS
  Filled 2013-05-21 (×3): qty 1

## 2013-05-21 MED ORDER — OXYCODONE HCL 5 MG PO TABS: 5.0000 mg | ORAL_TABLET | Freq: Once | ORAL | Status: DC | PRN

## 2013-05-21 MED ORDER — 0.9 % SODIUM CHLORIDE (POUR BTL) OPTIME
TOPICAL | Status: DC | PRN
  Administered 2013-05-21: 1000 mL

## 2013-05-21 MED ORDER — NEOSTIGMINE METHYLSULFATE 1 MG/ML IJ SOLN
INTRAMUSCULAR | Status: DC | PRN
  Administered 2013-05-21: 4 mg via INTRAVENOUS

## 2013-05-21 MED ORDER — SODIUM CHLORIDE 0.9 % IV SOLN
10.0000 mg | INTRAVENOUS | Status: DC | PRN
  Administered 2013-05-21: 40 ug/min via INTRAVENOUS

## 2013-05-21 MED ORDER — FENTANYL CITRATE 0.05 MG/ML IJ SOLN
INTRAMUSCULAR | Status: DC | PRN
  Administered 2013-05-21: 25 ug via INTRAVENOUS
  Administered 2013-05-21: 75 ug via INTRAVENOUS
  Administered 2013-05-21 (×2): 25 ug via INTRAVENOUS

## 2013-05-21 MED ORDER — OXYMETAZOLINE HCL 0.05 % NA SOLN
NASAL | Status: DC | PRN
  Administered 2013-05-21 (×2): 2 via NASAL

## 2013-05-21 MED ORDER — LIDOCAINE-EPINEPHRINE 2 %-1:100000 IJ SOLN
INTRAMUSCULAR | Status: DC | PRN
  Administered 2013-05-21: 5.4 mL

## 2013-05-21 MED ORDER — GLYCOPYRROLATE 0.2 MG/ML IJ SOLN
INTRAMUSCULAR | Status: DC | PRN
  Administered 2013-05-21: 0.6 mg via INTRAVENOUS

## 2013-05-21 MED ORDER — ONDANSETRON HCL 4 MG/2ML IJ SOLN: 4.0000 mg | Freq: Four times a day (QID) | INTRAMUSCULAR | Status: DC | PRN

## 2013-05-21 MED ORDER — ROCURONIUM BROMIDE 100 MG/10ML IV SOLN
INTRAVENOUS | Status: DC | PRN
  Administered 2013-05-21: 30 mg via INTRAVENOUS

## 2013-05-21 MED ORDER — INSULIN ASPART 100 UNIT/ML ~~LOC~~ SOLN: 0.0000 [IU] | SUBCUTANEOUS | Status: DC

## 2013-05-21 MED ORDER — LACTATED RINGERS IV SOLN
INTRAVENOUS | Status: DC | PRN
  Administered 2013-05-21 (×2): via INTRAVENOUS

## 2013-05-21 MED ORDER — LIDOCAINE HCL (CARDIAC) 20 MG/ML IV SOLN
INTRAVENOUS | Status: DC | PRN
  Administered 2013-05-21: 70 mg via INTRAVENOUS

## 2013-05-21 MED ORDER — PROPOFOL 10 MG/ML IV BOLUS
INTRAVENOUS | Status: DC | PRN
  Administered 2013-05-21: 20 mg via INTRAVENOUS
  Administered 2013-05-21: 140 mg via INTRAVENOUS

## 2013-05-21 MED ORDER — HYDROCODONE-ACETAMINOPHEN 5-325 MG PO TABS
1.0000 | ORAL_TABLET | Freq: Four times a day (QID) | ORAL | Status: DC | PRN
  Administered 2013-05-22 – 2013-05-23 (×4): 2 via ORAL
  Administered 2013-05-23: 1 via ORAL
  Filled 2013-05-21 (×3): qty 2
  Filled 2013-05-21: qty 1
  Filled 2013-05-21 (×2): qty 2

## 2013-05-21 MED ORDER — BUPIVACAINE-EPINEPHRINE 0.5% -1:200000 IJ SOLN
INTRAMUSCULAR | Status: DC | PRN
  Administered 2013-05-21: 3.6 mL

## 2013-05-21 MED ORDER — MIDAZOLAM HCL 5 MG/5ML IJ SOLN
INTRAMUSCULAR | Status: DC | PRN
  Administered 2013-05-21 (×2): 0.5 mg via INTRAVENOUS

## 2013-05-21 MED ORDER — OXYMETAZOLINE HCL 0.05 % NA SOLN
NASAL | Status: DC | PRN
  Administered 2013-05-21: 1 via NASAL

## 2013-05-21 MED ORDER — EPHEDRINE SULFATE 50 MG/ML IJ SOLN
INTRAMUSCULAR | Status: DC | PRN
  Administered 2013-05-21 (×3): 10 mg via INTRAVENOUS

## 2013-05-21 MED ORDER — PHENYLEPHRINE HCL 10 MG/ML IJ SOLN
INTRAMUSCULAR | Status: DC | PRN
  Administered 2013-05-21: 120 ug via INTRAVENOUS
  Administered 2013-05-21: 80 ug via INTRAVENOUS
  Administered 2013-05-21: 120 ug via INTRAVENOUS

## 2013-05-21 MED ORDER — SODIUM CHLORIDE 0.9 % IR SOLN: 200.0000 mL | Status: DC

## 2013-05-21 MED ORDER — ONDANSETRON HCL 4 MG/2ML IJ SOLN
INTRAMUSCULAR | Status: DC | PRN
  Administered 2013-05-21: 4 mg via INTRAVENOUS

## 2013-05-21 MED ORDER — OXYCODONE HCL 5 MG/5ML PO SOLN: 5.0000 mg | Freq: Once | ORAL | Status: DC | PRN

## 2013-05-21 MED ORDER — HYDROMORPHONE HCL PF 1 MG/ML IJ SOLN
0.2500 mg | INTRAMUSCULAR | Status: DC | PRN
  Filled 2013-05-21: qty 1

## 2013-05-21 MED ORDER — LACTATED RINGERS IV SOLN
INTRAVENOUS | Status: DC
  Administered 2013-05-21: 20 mL/h via INTRAVENOUS

## 2013-05-21 SURGICAL SUPPLY — 33 items
ALCOHOL 70% 16 OZ (MISCELLANEOUS) ×2 IMPLANT
ATTRACTOMAT 16X20 MAGNETIC DRP (DRAPES) ×2 IMPLANT
BLADE SURG 15 STRL LF DISP TIS (BLADE) ×2 IMPLANT
BLADE SURG 15 STRL SS (BLADE) ×4
CLOTH BEACON ORANGE TIMEOUT ST (SAFETY) ×2 IMPLANT
COVER SURGICAL LIGHT HANDLE (MISCELLANEOUS) ×2 IMPLANT
CRADLE DONUT ADULT HEAD (MISCELLANEOUS) ×2 IMPLANT
GAUZE PACKING FOLDED 2  STR (GAUZE/BANDAGES/DRESSINGS) ×1
GAUZE PACKING FOLDED 2 STR (GAUZE/BANDAGES/DRESSINGS) ×1 IMPLANT
GAUZE SPONGE 4X4 16PLY XRAY LF (GAUZE/BANDAGES/DRESSINGS) ×2 IMPLANT
GLOVE BIO SURGEON STRL SZ 6 (GLOVE) ×1 IMPLANT
GLOVE SURG ORTHO 8.0 STRL STRW (GLOVE) ×2 IMPLANT
GLOVE SURG SS PI 6.5 STRL IVOR (GLOVE) ×2 IMPLANT
GOWN STRL REIN 3XL LVL4 (GOWN DISPOSABLE) ×2 IMPLANT
HEMOSTAT SURGICEL .5X2 ABSORB (HEMOSTASIS) IMPLANT
KIT BASIN OR (CUSTOM PROCEDURE TRAY) ×2 IMPLANT
KIT ROOM TURNOVER OR (KITS) ×2 IMPLANT
MANIFOLD NEPTUNE WASTE (CANNULA) ×2 IMPLANT
NDL BLUNT 16X1.5 OR ONLY (NEEDLE) ×1 IMPLANT
NEEDLE BLUNT 16X1.5 OR ONLY (NEEDLE) ×2 IMPLANT
NS IRRIG 1000ML POUR BTL (IV SOLUTION) ×2 IMPLANT
PACK EENT II TURBAN DRAPE (CUSTOM PROCEDURE TRAY) ×2 IMPLANT
PAD ARMBOARD 7.5X6 YLW CONV (MISCELLANEOUS) ×4 IMPLANT
SPONGE GAUZE 4X4 12PLY (GAUZE/BANDAGES/DRESSINGS) ×1 IMPLANT
SPONGE SURGIFOAM ABS GEL SZ50 (HEMOSTASIS) ×1 IMPLANT
SUCTION FRAZIER TIP 10 FR DISP (SUCTIONS) ×2 IMPLANT
SUT CHROMIC 3 0 PS 2 (SUTURE) ×4 IMPLANT
SYR 50ML SLIP (SYRINGE) ×2 IMPLANT
TOWEL OR 17X24 6PK STRL BLUE (TOWEL DISPOSABLE) ×2 IMPLANT
TOWEL OR 17X26 10 PK STRL BLUE (TOWEL DISPOSABLE) ×2 IMPLANT
TUBE CONNECTING 12X1/4 (SUCTIONS) ×2 IMPLANT
WATER STERILE IRR 1000ML POUR (IV SOLUTION) ×2 IMPLANT
YANKAUER SUCT BULB TIP NO VENT (SUCTIONS) ×2 IMPLANT

## 2013-05-21 NOTE — Op Note (Signed)
Patient:            [name] Date of Birth:  1945/11/27 MRN:                [id number]   DATE OF PROCEDURE:  05/21/2013               OPERATIVE REPORT   PREOPERATIVE DIAGNOSES: 1. Severe mitral regurgitation 2. Pre-heart valve surgery dental protocol 3. Chronic periodontitis 4. Accretions 5. Dental caries 6. Retained root segment  POSTOPERATIVE DIAGNOSES: 1. Severe mitral regurgitation 2. Pre-heart valve surgery dental protocol 3. Chronic periodontitis 4. Accretions 5. Dental caries 6. Retained root segment  OPERATIONS: 1. Multiple extraction of tooth numbers 2, 4, 18 2. 2 Quadrants of alveoloplasty 3. Gross debridement of remaining dentition   SURGEON: Lenn Cal, DDS  ASSISTANT: Camie Patience (dental assistant)  ANESTHESIA: General anesthesia via nasoendotracheal tube.  MEDICATIONS: 1. Ancef 2 g IV prior to invasive dental procedures. 2. Local anesthesia with a total utilization of 3 carpules each containing 34 mg of lidocaine with 0.017 mg of epinephrine as well as 1 carpule each containing 9 mg of bupivacaine with 0.009 mg of epinephrine.  SPECIMENS: There are 3 teeth that were discarded.  DRAINS: None  CULTURES: None  COMPLICATIONS: None   ESTIMATED BLOOD LOSS: 50 mLs.  INTRAVENOUS FLUIDS: 1000 mLs of Lactated ringers solution.  INDICATIONS: The patient was recently diagnosed with severe mitral regurgitation.  A medically necessary dental consultation was then requested to evaluate poor dentition.  The patient was examined and treatment planned for multiple extractions with alveoloplasty and gross debridement of remaining dentition in the operating room.  This treatment plan was formulated to decrease the risks and complications associated with dental infection from affecting the patient's systemic health and the anticipated heart valve surgery.  OPERATIVE FINDINGS: Patient was examined operating room number 8.  The teeth were identified for  extraction. The patient was noted be affected by chronic periodontitis, accretions, dental caries,and retained root segment.   DESCRIPTION OF PROCEDURE: Patient was brought to the main operating room number 8. Patient was then placed in the supine position on the operating table. general Anesthesia was then induced per the anesthesia team. The patient was then prepped and draped in the usual manner for dental medicine procedure. A timeout was performed. The patient was identified and procedures were verified. A throat pack was placed at this time. The oral cavity was then thoroughly examined with the findings noted above. The patient was then ready for dental medicine procedure as follows:  Local anesthesia was then administered sequentially with a total utilization of 3 carpules each containing 34 mg of lidocaine with 0.017 mg of epinephrine as well as 1 carpules  each containing 9 mg bupivacaine with 0.009 mg of epinephrine.  The Maxillary right quadrant was first approached. Anesthesia was then delivered utilizing infiltration with lidocaine with epinephrine. A #15 blade incision was then made from the distal of the tuberosity and extended to the mesial of #4.  A  surgical flap was then carefully reflected. Appropriate amounts of buccal and interseptal bone were then removed utilizing a surgical handpiece and bur and copious amounts of sterile water.  The teeth were then subluxated with a series of straight elevators. Tooth number 4 was then removed with a 150 forceps without complications. The coronal aspect of tooth #2 was then removed with a 53R forceps. This left the roots remaining. Further bone was then removed with a surgical handpiece and bur  and copious of sterile water. The teeth roots were then subluxated with a series straight elevators and removed with a set of cryers elevators without complication. Alveoloplasty some performed utilizing a rongeur and bone file. The surgical site was then  irrigated with copious of sterile saline. The tissues were approximated and trimmed appropriately.  A piece of Surgifoam was then placed in the extraction sockets appropriately. The surgical site was then closed from the maxillary right tuberosity and extended to the distal of #3 utilizing 3-0 chromic gut suture in a continuous interrupted suture technique x1. The surgical site an area of tooth #4 was closed utilizing figure-of-eight suture technique and 3-0 chromic gut material.  At this point time, the mandibular quadrants were approached. The patient was given a mandibular left inferior alveolar nerve block and long buccal nerve block utilizing the bupivacaine with epinephrine. Further infiltration was then achieved utilizing the lidocaine with epinephrine. A 15 blade incision was then made from the distal of number 17 and extended to the distal of #20.  A surgical flap was then carefully reflected. Appropriate amounts of buccal and interseptal bone were then removed appropriately. Tooth number 18 then had the Coronal aspect removed with a 23 forceps leaving roots remaining. Further bone was then removed utilizing a surgical handpiece and bur and copious amounts of sterile water. The roots were then subluxated and removed with a series of cryers elevators without complications. Alveoloplasty was then performed utilizing a rongeur and bone file. The surgical site was irrigated with copious amounts of sterile saline. The tissues were approximated and trimmed appropriately. Surgifoam was then placed in the extraction socket. The surgical site was then closed from the mesial #17 and extended to the distal of #20 utilizing 3-0 chromic gut suture in a continuous surface suture technique x1.  The remaining dentition was then approached. A sonic scaler was used to remove accretions. A series of hand curettes was then used to refine removal of accretions. A sonic scaler was then again used to refine removal of  accretions. At this point time a timeout was irrigated with copious amounts sterile saline. The patient was examined for fabrication, seeing none, the dental medicine procedures deemed to be complete. The throat pack was removed at this time. A series of 4 x 4 gauze were placed in the mouth to aid hemostasis. An oral airway was placed at the request of the anesthesia team. The patient was then handed over to the anesthesia team for final disposition. After an appropriate amount of time, the patient was extubated and taken to the postanesthsia care unit with stable vital signs and a good condition. All counts were correct for the dental medicine procedure.  The patient will proceed with mitral valve replacement with Dr. Prescott Gum as per his discretion.   Lenn Cal, DDS.

## 2013-05-21 NOTE — Progress Notes (Signed)
The patient was taken off the monitor and went to the OR.  She did not have any complaints overnight and no acute changes.

## 2013-05-21 NOTE — Progress Notes (Deleted)
1200 back from HD unit via bed with Nurses Bo and Dalene Seltzer. Pt no aapparent distress

## 2013-05-21 NOTE — Anesthesia Procedure Notes (Signed)
Procedure Name: Intubation Date/Time: 05/21/2013 7:41 AM Performed by: Terrill Mohr Pre-anesthesia Checklist: Patient identified, Emergency Drugs available, Suction available and Patient being monitored Patient Re-evaluated:Patient Re-evaluated prior to inductionOxygen Delivery Method: Circle system utilized Preoxygenation: Pre-oxygenation with 100% oxygen Intubation Type: IV induction Ventilation: Mask ventilation without difficulty Laryngoscope Size: Mac and 3 Grade View: Grade II Nasal Tubes: Right Tube size: 7.0 mm Number of attempts: 2 (first attempt slid off cords) Placement Confirmation: ETT inserted through vocal cords under direct vision,  breath sounds checked- equal and bilateral and positive ETCO2 Tube secured with: Tape (taped at flex across bridge of nose to cheeks) Dental Injury: Teeth and Oropharynx as per pre-operative assessment  Comments: Of note:  Cords were a dusky yellow gray color

## 2013-05-21 NOTE — Progress Notes (Deleted)
1200 Back from HD via bed with  BO and Billie RN . Pt no apparent distress . Kept p comfortable . Safety precaution observed

## 2013-05-21 NOTE — Anesthesia Postprocedure Evaluation (Signed)
Anesthesia Post Note  Patient: [name]  Procedure(s) Performed: Procedure(s) (LRB): Extraction of tooth #'s 2,4,18 with alveoloplasty and gross debridement of remaining teeth (N/A)  Anesthesia type: General  Patient location: PACU  Post pain: Pain level controlled and Adequate analgesia  Post assessment: Post-op Vital signs reviewed, Patient's Cardiovascular Status Stable, Respiratory Function Stable, Patent Airway and Pain level controlled  Last Vitals:  Filed Vitals:   05/21/13 1010  BP: 96/48  Pulse: 65  Temp:   Resp: 19    Post vital signs: Reviewed and stable  Level of consciousness: awake, alert  and oriented  Complications: No apparent anesthesia complications

## 2013-05-21 NOTE — Transfer of Care (Signed)
Immediate Anesthesia Transfer of Care Note  Patient: [name]  Procedure(s) Performed: Procedure(s): Extraction of tooth #'s 2,4,18 with alveoloplasty and gross debridement of remaining teeth (N/A)  Patient Location: PACU  Anesthesia Type:General  Level of Consciousness: awake, alert , oriented and patient cooperative  Airway & Oxygen Therapy: Patient Spontanous Breathing and Patient connected to nasal cannula oxygen  Post-op Assessment: Report given to PACU RN, Post -op Vital signs reviewed and stable and Patient moving all extremities  Post vital signs: Reviewed and stable  Complications: No apparent anesthesia complications

## 2013-05-21 NOTE — Progress Notes (Signed)
Day of Surgery Procedure(s) (LRB): Extraction of tooth #'s 2,4,18 with alveoloplasty and gross debridement of remaining teeth (N/A) Subjective: stable but sore post dental extractions Severe mitral regurgitation with CHF Carotid Doppler show no significant disease PFTs with moderate obstruction FEV1 1.8, FVC 2.2, DLCO 48%  Appreciate dental evaluation and preop dental therapy Will allow 2 days of healing and reschedule mitral valve repair-replacement for Friday, August 8. Plan discussed with patient  Objective: Vital signs in last 24 hours: Temp:  [97.4 F (36.3 C)-98.5 F (36.9 C)] 97.4 F (36.3 C) (08/05 1040) Pulse Rate:  [63-74] 71 (08/05 1400) Cardiac Rhythm:  [-] Normal sinus rhythm (08/05 1200) Resp:  [17-20] 18 (08/05 1400) BP: (94-117)/(31-61) 95/61 mmHg (08/05 1400) SpO2:  [94 %-100 %] 94 % (08/05 1400) Weight:  [150 lb 4.8 oz (68.176 kg)] 150 lb 4.8 oz (68.176 kg) (08/05 0448)  Hemodynamic parameters for last 24 hours:   sinus rhythm  Intake/Output from previous day: 08/04 0701 - 08/05 0700 In: 1183 [P.O.:1180; I.V.:3] Out: 1550 [Urine:1550] Intake/Output this shift: Total I/O In: 1150 [I.V.:1150] Out: 100 [id number]  Holosystolic murmur to left axilla unchanged Lungs clear  Lab Results: No results found for this basename: WBC, HGB, HCT, PLT,  in the last 72 hours BMET:   Recent Labs  05/20/13 0400 05/21/13 0500  NA 135 135  K 4.0 5.1  CL 95* 94*  CO2 28 29  GLUCOSE 109* 101*  BUN 33* 38*  CREATININE 1.40* 1.26*  CALCIUM 9.4 9.8    PT/INR:   Recent Labs  05/21/13 0500  LABPROT 12.8  INR 0.98   ABG    Component Value Date/Time   PHART 7.356 09/06/2009 0420   HCO3 19.8* 09/06/2009 0420   TCO2 20.9 09/06/2009 0420   ACIDBASEDEF 4.7* 09/06/2009 0420   O2SAT 94.8 09/06/2009 0420   CBG (last 3)   Recent Labs  05/18/13 2145 05/21/13 1701  GLUCAP 162* 88    Assessment/Plan: S/P Procedure(s) (LRB): Extraction of tooth #'s 2,4,18  with alveoloplasty and gross debridement of remaining teeth (N/A) Mitral valve repair-replacement Friday, August 8   LOS: 7 days    [name],[name] 05/21/2013

## 2013-05-21 NOTE — Progress Notes (Signed)
1830 no bleeding noted.Gauze replaced

## 2013-05-21 NOTE — Anesthesia Preprocedure Evaluation (Addendum)
Anesthesia Evaluation  Patient identified by MRN, date of birth, ID band Patient awake    Reviewed: Allergy & Precautions, H&P , NPO status , Patient's Chart, lab work & pertinent test results, reviewed documented beta blocker date and time   Airway Mallampati: II TM Distance: >3 FB Neck ROM: full    Dental  (+) Dental Advisory Given and Missing   Pulmonary          Cardiovascular hypertension, Pt. on home beta blockers +CHF + dysrhythmias Atrial Fibrillation + pacemaker + Cardiac Defibrillator + Valvular Problems/Murmurs MR     Neuro/Psych  Headaches, Anxiety Depression    GI/Hepatic GERD-  Medicated and Controlled,  Endo/Other  Hypothyroidism   Renal/GU      Musculoskeletal   Abdominal   Peds  Hematology   Anesthesia Other Findings Pt upper front teeth appear to be intact.  Pt keeps head positioned a bit to the right and extended on neck due to scoliosis...  Reproductive/Obstetrics                         Anesthesia Physical Anesthesia Plan  ASA: III  Anesthesia Plan: General   Post-op Pain Management:    Induction: Intravenous  Airway Management Planned: Nasal ETT  Additional Equipment:   Intra-op Plan:   Post-operative Plan: Extubation in OR  Informed Consent: I have reviewed the patients History and Physical, chart, labs and discussed the procedure including the risks, benefits and alternatives for the proposed anesthesia with the patient or authorized representative who has indicated his/her understanding and acceptance.     Plan Discussed with: CRNA, Anesthesiologist and Surgeon  Anesthesia Plan Comments:         Anesthesia Quick Evaluation

## 2013-05-21 NOTE — Progress Notes (Signed)
0700 Out of room to OR

## 2013-05-21 NOTE — Preoperative (Signed)
Beta Blockers   Reason not to administer Beta Blockers:Pt takes Toprol at 10 p.m.; had a dose last evening

## 2013-05-21 NOTE — Progress Notes (Signed)
[facility] Progress note  Aaralynn Buller [id number] DOB: 06/28/1946 DOA: 05/14/2013  Referring physician:  PCP: Ria Bush, MD  Specialists:   Chief Complaint: Increasing DOE, fatigue  HPI: [name] is a 67 y.o. WF PMHx CHF, mitral regurg, cardiac arrhythmia, atrial fibrillation, HTN, HLD, hypothyroidism. She was seen in the emergency Department 5 days ago with vomiting and diarrhea. Which she said resolved by Tuesday. States Over the last several days, she has noted dyspnea with exertion when she walks as little as 4 feet, positive fatigue, negative chest pain, negative firing of her defibrillator ([employer] single chamber ICD ), positive anorexia, slightly elevated temperature measured at 99.3, right lower extremity swelling, bilateral lower extremity coldness. There's been a cough which is intermittently productive of yellowish sputum. Nothing makes symptoms better nothing makes it worse. States she had a cardiac echo in 2013 but does not recall results states cardiac defibrillator placed 2010. Sees 3 cardiologist @  [facility] ( Dr Barbaraann Boys, Dr taylor, Dr Harrington Challenger). TODAY S/P day 6 catheterization by Dr. Charolette Forward (cardiology). Patient will require mitral valve repair /replacement by Dr Nils Pyle ([facility]). Dr. Jori MollEnrique Sack dentist right away patient this morning for presurgery clearance of mitral valve repair/replacement. Plan of care is as follows:  extraction of indicated teeth with alveoloplasty and gross debridement of remaining dentition the operating room with general anesthesia on 05/21/2013 at 7:30 AM. Patient agrees to proceed with extraction of tooth numbers 2, 4, and 18 and other teeth as needed. The patient will then proceed with anticipated mitral valve replacement with Dr. Prescott Gum on Friday. Patient has returned from dental surgery and is having some increased pain   Procedure;   CT angiogram of chest PE protocol 05/15/2013 1. Technically adequate  study without pulmonary embolism.  2. Cardiomegaly, pleural effusions, pulmonary edema are all  consistent with CHF.      Past Medical History  Diagnosis Date  . Arthritis   . Hypertension   . A-fib   . High cholesterol   . Hypothyroidism   . Depression   . Shortness of breath     exertion  . Pacemaker     icd  . CHF (congestive heart failure)   . GERD (gastroesophageal reflux disease)   . [id number])    Past Surgical History  Procedure Laterality Date  . Cardiac defibrillator placement    . Abdominal hysterectomy    . Hernia repair  as child    x 2  . Tee without cardioversion  09/24/2012    Procedure: TRANSESOPHAGEAL ECHOCARDIOGRAM (TEE);  Surgeon: Fay Records, MD;  Location: [facility] ENDOSCOPY;  Service: Cardiovascular;  Laterality: N/A;   Social History:  reports that she has never smoked. She has never used smokeless tobacco. She reports that she does not drink alcohol or use illicit drugs.   Allergies  Allergen Reactions  . Codeine Nausea And Vomiting  . Meperidine Hcl Nausea And Vomiting    History reviewed. No pertinent family history.   Prior to Admission medications   Medication Sig Start Date End Date Taking? Authorizing Provider  acetaminophen (TYLENOL) 500 MG tablet Take 1,000 mg by mouth every 6 (six) hours as needed. For pain   Yes Historical Provider, MD  aspirin EC 325 MG tablet Take 325 mg by mouth every other day.   Yes Historical Provider, MD  Aspirin-Acetaminophen-Caffeine (GOODY HEADACHE PO) Take 1 packet by mouth as needed. For pain   Yes Historical Provider, MD  diazepam (VALIUM) 2 MG  tablet Take 2 mg by mouth 3 (three) times daily. For anxiety   Yes Historical Provider, MD  fish oil-omega-3 fatty acids 1000 MG capsule Take 1 g by mouth daily.   Yes Historical Provider, MD  furosemide (LASIX) 40 MG tablet Take 2 tablets (80 mg total) by mouth daily. 10/24/12  Yes Evans Lance, MD  hydrOXYzine (ATARAX/VISTARIL) 10 MG tablet Take 10 mg by mouth  at bedtime.    Yes Historical Provider, MD  levothyroxine (SYNTHROID, LEVOTHROID) 88 MCG tablet Take 88 mcg by mouth daily.   Yes Historical Provider, MD  lisinopril (PRINIVIL,ZESTRIL) 10 MG tablet Take 10 mg by mouth 2 (two) times daily.   Yes Historical Provider, MD  metoprolol succinate (TOPROL-XL) 50 MG 24 hr tablet Take 50 mg by mouth daily. Take with or immediately following a meal.   Yes Historical Provider, MD  pantoprazole (PROTONIX) 40 MG tablet Take 40 mg by mouth daily.   Yes Historical Provider, MD  PARoxetine (PAXIL) 30 MG tablet Take 60 mg by mouth daily.    Yes Historical Provider, MD  Polyvinyl Alcohol-Povidone (MURINE TEARS FOR DRY EYES OP) Place 1 drop into both eyes as needed. For dry eyes   Yes Historical Provider, MD  potassium chloride SA (KLOR-CON M20) 20 MEQ tablet Take 1 tablet (20 mEq total) by mouth daily. 07/03/12  Yes Evans Lance, MD  simvastatin (ZOCOR) 40 MG tablet Take 0.5 tablets (20 mg total) by mouth every evening. 09/20/12  Yes Evans Lance, MD  ondansetron (ZOFRAN) 4 MG tablet Take 1 tablet (4 mg total) by mouth every 6 (six) hours. 05/09/13   Rhunette Croft, MD   Physical Exam: Filed Vitals:   05/21/13 0955 05/21/13 1010 05/21/13 1025 05/21/13 1040  BP: 97/46 96/48 103/51   Pulse: 65 65 65   Temp:    97.4 F (36.3 C)  TempSrc:      Resp: 17 19 18    Height:      Weight:      SpO2: 100% 99% 100%      General: Alert, having increased pain secondary to tell surgery requesting pain medication   Cardiovascular: Regular rhythm and rate, grade 4 holosystolic murmur, negative rubs or gallops, PD/TP pulses 2+ bilateral  Respiratory: Clear to auscultation bilaterally  Abdomen: Soft nontender plus bowel sounds    Labs on Admission:  Basic Metabolic Panel:  Recent Labs Lab 05/15/13 1050  05/17/13 0525 05/18/13 0545 05/19/13 0555 05/20/13 0400 05/21/13 0500  NA 136  < > 140 135 134* 135 135  K 3.9  < > 3.8 3.7 4.0 4.0 5.1  CL 96  < > 97 91*  93* 95* 94*  CO2 25  < > 29 28 27 28 29   GLUCOSE 102*  < > 103* 100* 125* 109* 101*  BUN 17  < > 19 24* 27* 33* 38*  CREATININE 1.15*  < > 1.25* 1.19* 1.28* 1.40* 1.26*  CALCIUM 9.5  < > 9.5 9.6 9.6 9.4 9.8  MG 2.4  --   --   --   --   --   --   < > = values in this interval not displayed. Liver Function Tests:  Recent Labs Lab 05/17/13 0525 05/18/13 0545 05/19/13 0555 05/20/13 0400 05/21/13 0500  AST 53* 45* 41* 35 39*  ALT 62* 52* 46* 37* 33  ALKPHOS 71 74 100 78 80  BILITOT 0.7 0.7 0.4 0.3 0.3  PROT 7.2 7.1 7.2 6.7 6.9  ALBUMIN  4.2 4.2 4.1 3.8 4.0    Recent Labs Lab 05/14/13 2325  LIPASE 19   No results found for this basename: AMMONIA,  in the last 168 hours CBC:  Recent Labs Lab 05/14/13 2325 05/15/13 1050  WBC 5.8 7.0  NEUTROABS 4.5 5.0  HGB 9.7* 9.8*  HCT 28.8* 28.8*  MCV 98.6 98.0  PLT 236 265   Cardiac Enzymes:  Recent Labs Lab 05/14/13 2325 05/15/13 1050 05/15/13 1502 05/15/13 2033  TROPONINI <0.30 <0.30 <0.30 <0.30    BNP (last 3 results)  Recent Labs  05/15/13 0200 05/16/13 0540  PROBNP 13764.0* 15859.0*   CBG:  Recent Labs Lab 05/18/13 2145  GLUCAP 162*    Radiological Exams on Admission: No results found.  EKG: Pending  Assessment/Plan Principal Problem:   Chronic systolic heart failure Active Problems:   HYPOTHYROIDISM   HYPERLIPIDEMIA   HYPERTENSION   CARDIAC ARRHYTHMIA   Mitral regurgitation   Anxiety state, unspecified   Atrial fibrillation   1. CHF; patient unsure what her baseline weight is current weight is 150 pounds,. We'll continue to diuresis patient. Continue Strict I/O and QD Weights. BNP= 15859 2. Atrial fibrillation; currently not in A-Fib. Continue to hold Coumadin 2dary to possible surgery. Start patient on SCD for DVT  Prophylaxis 3.  HLD; patient's cholesterol level within NCEP guidelines 4. Hypothyroidism; TSH= 9.99(high), free T4= 1.55(NL), patient therefore meets criteria for sub clinical  hypothyroidism continue her Synthroid 100 mcg daily Mitral Valve Regurgitation; Dr Darcey Nora (CTSurgery) has been contacted by Dr.Mohan [facility] (cardiology), . And we'll see patient and determine the plan of care concerning repair/replacement of mitral. NOTE Dr. Jori Moll: Enrique Sack and performed extraction of indicated teeth with alveoloplasty and gross debridement of remaining dentition the operating room with general anesthesia today    Code Status: Full Family Communication:  Disposition Plan: Per cardiology/CT surgery  Time spent: 45 minutes  Allie Bossier [facility] Pager [phone]  If 7PM-7AM, please contact night-coverage [email or web address] Password [facility] 05/21/2013, 11:43 AM

## 2013-05-21 NOTE — Progress Notes (Signed)
PRE-OPERATIVE NOTE:  05/21/2013 [name] [id number]  VITALS: BP 117/58  Pulse 63  Temp(Src) 98.2 F (36.8 C) (Oral)  Resp 19  Ht 5\' 4"  (1.626 m)  Wt 150 lb 4.8 oz (68.176 kg)  BMI 25.79 kg/m2  SpO2 99%  Lab Results  Component Value Date   WBC 7.0 05/15/2013   HGB 9.8* 05/15/2013   HCT 28.8* 05/15/2013   MCV 98.0 05/15/2013   PLT 265 05/15/2013   BMET    Component Value Date/Time   NA 135 05/21/2013 0500   K 5.1 05/21/2013 0500   CL 94* 05/21/2013 0500   CO2 29 05/21/2013 0500   GLUCOSE 101* 05/21/2013 0500   BUN 38* 05/21/2013 0500   CREATININE 1.26* 05/21/2013 0500   CALCIUM 9.8 05/21/2013 0500   GFRNONAA 44* 05/21/2013 0500   GFRAA 51* 05/21/2013 0500    Lab Results  Component Value Date   INR 0.98 05/21/2013   INR 1.00 05/20/2013   INR 0.89 05/19/2013   No results found for this basename: PTT     [name] presents for multiple extractions with alveoloplasty and gross treatment of remaining dentition.   SUBJECTIVE: The patient denies any acute medical or dental changes and agrees to proceed with treatment as planned. The patient is aware that additional teeth may be extracted after further evaluation in the operating room under anesthesia.  EXAM: No sign of acute dental changes.  ASSESSMENT: Patient is affected by chronic periodontitis, accretions, retained root segments, dental caries, mitral regurgitation and then anticipated mitral valve replacement surgery in the future.  PLAN: Patient agrees to proceed with treatment as planned in the operating room as previously discussed and accepts the risks, benefits, complications of the proposed treatment.   Lenn Cal, DDS

## 2013-05-21 NOTE — Progress Notes (Signed)
1100 Admitted from PACU via bed  With Angel,RN. Pt awake with gauze in the buccal cavity , slightly saturated with pionkish drainage . With ice pack around face . Pt  Lethargic ,awake followsm verbal commands . Kept comfortable in bed

## 2013-05-22 DIAGNOSIS — I4891 Unspecified atrial fibrillation: Secondary | ICD-10-CM

## 2013-05-22 DIAGNOSIS — I5023 Acute on chronic systolic (congestive) heart failure: Secondary | ICD-10-CM

## 2013-05-22 DIAGNOSIS — D649 Anemia, unspecified: Secondary | ICD-10-CM

## 2013-05-22 DIAGNOSIS — F411 Generalized anxiety disorder: Secondary | ICD-10-CM

## 2013-05-22 DIAGNOSIS — I509 Heart failure, unspecified: Secondary | ICD-10-CM

## 2013-05-22 LAB — GLUCOSE, CAPILLARY
Glucose-Capillary: 113 mg/dL — ABNORMAL HIGH (ref 70–99)
Glucose-Capillary: 105 mg/dL — ABNORMAL HIGH (ref 70–99)
Glucose-Capillary: 111 mg/dL — ABNORMAL HIGH (ref 70–99)

## 2013-05-22 LAB — COMPREHENSIVE METABOLIC PANEL
ALT: 23 U/L (ref 0–35)
Albumin: 3.5 g/dL (ref 3.5–5.2)
Alkaline Phosphatase: 64 U/L (ref 39–117)
BUN: 25 mg/dL — ABNORMAL HIGH (ref 6–23)
Chloride: 97 mEq/L (ref 96–112)
Glucose, Bld: 105 mg/dL — ABNORMAL HIGH (ref 70–99)
Potassium: 5.1 mEq/L (ref 3.5–5.1)
Sodium: 134 mEq/L — ABNORMAL LOW (ref 135–145)
Total Bilirubin: 0.4 mg/dL (ref 0.3–1.2)
Total Protein: 6.3 g/dL (ref 6.0–8.3)

## 2013-05-22 MED ORDER — MAGNESIUM HYDROXIDE 400 MG/5ML PO SUSP
5.0000 mL | Freq: Every day | ORAL | Status: DC | PRN
  Administered 2013-05-23: 5 mL via ORAL
  Filled 2013-05-22: qty 30

## 2013-05-22 NOTE — Progress Notes (Signed)
The patient has been having a small amount of pink-tinged drainage from her mouth.  She has been medicated with Dilaudid for pain.  She has also been coughing.  Robitussin DM was given to her with some symptom relief.

## 2013-05-22 NOTE — Progress Notes (Signed)
[facility] Progress note  [name] [id number] DOB: 1946-08-24 DOA: 05/14/2013  Referring physician:  PCP: Ria Bush, MD  Specialists:   Chief Complaint: Increasing DOE, fatigue Assessment/Plan Principal Problem:   Chronic systolic heart failure Active Problems:   HYPOTHYROIDISM   HYPERLIPIDEMIA   HYPERTENSION   CARDIAC ARRHYTHMIA   Mitral regurgitation   Anxiety state, unspecified   Atrial fibrillation   1. CHF; patient unsure what her baseline weight is current weight is 152 pounds,. We'll continue to diuresis patient. Continue Strict I/O and QD Weights. BNP= 15859 2. Atrial fibrillation; currently not in A-Fib. Continue to hold Coumadin 2dary to possible surgery. Start patient on SCD for DVT  Prophylaxis 3.  HLD; patient's cholesterol level within NCEP guidelines 4. Hypothyroidism; TSH= 9.99(high), free T4= 1.55(NL), patient therefore meets criteria for sub clinical hypothyroidism continue her Synthroid 100 mcg daily Mitral Valve Regurgitation; Dr Darcey Nora (CTSurgery) has been contacted by Dr.Mohan [facility] (cardiology), . Plan for valve replacement on Friday 8/8. NOTE Dr. Jori Moll: Enrique Sack and performed extraction of indicated teeth with alveoloplasty and gross debridement of remaining dentition the operating room with general anesthesia    Code Status: Full Family Communication:  Disposition Plan: Per cardiology/CT surgery    Subjective: Pain controlled  Procedure;   CT angiogram of chest PE protocol 05/15/2013 1. Technically adequate study without pulmonary embolism.  2. Cardiomegaly, pleural effusions, pulmonary edema are all  consistent with CHF.      Past Medical History  Diagnosis Date  . Arthritis   . Hypertension   . A-fib   . High cholesterol   . Hypothyroidism   . Depression   . Shortness of breath     exertion  . Pacemaker     icd  . CHF (congestive heart failure)   . GERD (gastroesophageal reflux disease)   .  [id number])    Past Surgical History  Procedure Laterality Date  . Cardiac defibrillator placement    . Abdominal hysterectomy    . Hernia repair  as child    x 2  . Tee without cardioversion  09/24/2012    Procedure: TRANSESOPHAGEAL ECHOCARDIOGRAM (TEE);  Surgeon: Fay Records, MD;  Location: [facility] ENDOSCOPY;  Service: Cardiovascular;  Laterality: N/A;   Social History:  reports that she has never smoked. She has never used smokeless tobacco. She reports that she does not drink alcohol or use illicit drugs.   Allergies  Allergen Reactions  . Codeine Nausea And Vomiting  . Meperidine Hcl Nausea And Vomiting    History reviewed. No pertinent family history.   Prior to Admission medications   Medication Sig Start Date End Date Taking? Authorizing Provider  acetaminophen (TYLENOL) 500 MG tablet Take 1,000 mg by mouth every 6 (six) hours as needed. For pain   Yes Historical Provider, MD  aspirin EC 325 MG tablet Take 325 mg by mouth every other day.   Yes Historical Provider, MD  Aspirin-Acetaminophen-Caffeine (GOODY HEADACHE PO) Take 1 packet by mouth as needed. For pain   Yes Historical Provider, MD  diazepam (VALIUM) 2 MG tablet Take 2 mg by mouth 3 (three) times daily. For anxiety   Yes Historical Provider, MD  fish oil-omega-3 fatty acids 1000 MG capsule Take 1 g by mouth daily.   Yes Historical Provider, MD  furosemide (LASIX) 40 MG tablet Take 2 tablets (80 mg total) by mouth daily. 10/24/12  Yes Evans Lance, MD  hydrOXYzine (ATARAX/VISTARIL) 10 MG tablet Take 10 mg by mouth at bedtime.  Yes Historical Provider, MD  levothyroxine (SYNTHROID, LEVOTHROID) 88 MCG tablet Take 88 mcg by mouth daily.   Yes Historical Provider, MD  lisinopril (PRINIVIL,ZESTRIL) 10 MG tablet Take 10 mg by mouth 2 (two) times daily.   Yes Historical Provider, MD  metoprolol succinate (TOPROL-XL) 50 MG 24 hr tablet Take 50 mg by mouth daily. Take with or immediately following a meal.   Yes Historical  Provider, MD  pantoprazole (PROTONIX) 40 MG tablet Take 40 mg by mouth daily.   Yes Historical Provider, MD  PARoxetine (PAXIL) 30 MG tablet Take 60 mg by mouth daily.    Yes Historical Provider, MD  Polyvinyl Alcohol-Povidone (MURINE TEARS FOR DRY EYES OP) Place 1 drop into both eyes as needed. For dry eyes   Yes Historical Provider, MD  potassium chloride SA (KLOR-CON M20) 20 MEQ tablet Take 1 tablet (20 mEq total) by mouth daily. 07/03/12  Yes Evans Lance, MD  simvastatin (ZOCOR) 40 MG tablet Take 0.5 tablets (20 mg total) by mouth every evening. 09/20/12  Yes Evans Lance, MD  ondansetron (ZOFRAN) 4 MG tablet Take 1 tablet (4 mg total) by mouth every 6 (six) hours. 05/09/13   Rhunette Croft, MD   Physical Exam: Filed Vitals:   05/21/13 2017 05/22/13 0501 05/22/13 0932 05/22/13 0935  BP: 110/41 100/61 122/72   Pulse: 65 88  80  Temp: 97.9 F (36.6 C) 98.4 F (36.9 C)    TempSrc: Oral Oral    Resp: 19 20    Height:      Weight:  69.355 kg (152 lb 14.4 oz)    SpO2: 96% 100%       General: Alert, having increased pain secondary to tell surgery requesting pain medication   Cardiovascular: Regular rhythm and rate, grade 4 holosystolic murmur, negative rubs or gallops, PD/TP pulses 2+ bilateral  Respiratory: Clear to auscultation bilaterally  Abdomen: Soft nontender plus bowel sounds    Labs on Admission:  Basic Metabolic Panel:  Recent Labs Lab 05/18/13 0545 05/19/13 0555 05/20/13 0400 05/21/13 0500 05/22/13 0440  NA 135 134* 135 135 134*  K 3.7 4.0 4.0 5.1 5.1  CL 91* 93* 95* 94* 97  CO2 28 27 28 29 26   GLUCOSE 100* 125* 109* 101* 105*  BUN 24* 27* 33* 38* 25*  CREATININE 1.19* 1.28* 1.40* 1.26* 1.13*  CALCIUM 9.6 9.6 9.4 9.8 9.3   Liver Function Tests:  Recent Labs Lab 05/18/13 0545 05/19/13 0555 05/20/13 0400 05/21/13 0500 05/22/13 0440  AST 45* 41* 35 39* 48*  ALT 52* 46* 37* 33 23  ALKPHOS 74 100 78 80 64  BILITOT 0.7 0.4 0.3 0.3 0.4  PROT 7.1  7.2 6.7 6.9 6.3  ALBUMIN 4.2 4.1 3.8 4.0 3.5   No results found for this basename: LIPASE, AMYLASE,  in the last 168 hours No results found for this basename: AMMONIA,  in the last 168 hours CBC: No results found for this basename: WBC, NEUTROABS, HGB, HCT, MCV, PLT,  in the last 168 hours Cardiac Enzymes:  Recent Labs Lab 05/15/13 1502 05/15/13 2033  TROPONINI <0.30 <0.30    BNP (last 3 results)  Recent Labs  05/15/13 0200 05/16/13 0540  PROBNP 13764.0* 15859.0*   CBG:  Recent Labs Lab 05/21/13 1701 05/21/13 2030 05/22/13 0100 05/22/13 0344 05/22/13 0752  GLUCAP 88 106* 113* 105* 111*    Radiological Exams on Admission: No results found.  EKG: Pending    Time spent: 35 minutes  [name],  Tabor Denham [facility] Pager [phone]  If 7PM-7AM, please contact night-coverage [email or web address] Password [email or web address] 05/22/2013, 11:59 AM

## 2013-05-22 NOTE — Progress Notes (Signed)
Pt c/o being hungry. No further oral bleeding. Diet  advanced as ordered.

## 2013-05-23 ENCOUNTER — Encounter (HOSPITAL_COMMUNITY): Payer: Self-pay | Admitting: Dentistry

## 2013-05-23 ENCOUNTER — Inpatient Hospital Stay (HOSPITAL_COMMUNITY): Payer: Medicare Other

## 2013-05-23 DIAGNOSIS — I059 Rheumatic mitral valve disease, unspecified: Principal | ICD-10-CM

## 2013-05-23 DIAGNOSIS — E039 Hypothyroidism, unspecified: Secondary | ICD-10-CM

## 2013-05-23 LAB — COMPREHENSIVE METABOLIC PANEL
ALT: 20 U/L (ref 0–35)
Alkaline Phosphatase: 75 U/L (ref 39–117)
BUN: 25 mg/dL — ABNORMAL HIGH (ref 6–23)
Chloride: 92 mEq/L — ABNORMAL LOW (ref 96–112)
GFR calc Af Amer: 55 mL/min — ABNORMAL LOW (ref >90)
Glucose, Bld: 99 mg/dL (ref 70–99)
Potassium: 4.9 mEq/L (ref 3.5–5.1)
Sodium: 133 mEq/L — ABNORMAL LOW (ref 135–145)
Total Bilirubin: 0.4 mg/dL (ref 0.3–1.2)
Total Protein: 7 g/dL (ref 6.0–8.3)

## 2013-05-23 LAB — CBC
HCT: 33.4 % — ABNORMAL LOW (ref 36.0–46.0)
HCT: 33.3 % — ABNORMAL LOW (ref 36.0–46.0)
Hemoglobin: 11.1 g/dL — ABNORMAL LOW (ref 12.0–15.0)
Hemoglobin: 11.1 g/dL — ABNORMAL LOW (ref 12.0–15.0)
MCH: 33.1 pg (ref 26.0–34.0)
MCH: 33.3 pg (ref 26.0–34.0)
MCHC: 33.2 g/dL (ref 30.0–36.0)
MCHC: 33.3 g/dL (ref 30.0–36.0)
MCV: 99.7 fL (ref 78.0–100.0)
MCV: 100 fL (ref 78.0–100.0)
Platelets: 307 10*3/uL (ref 150–400)
Platelets: 326 10*3/uL (ref 150–400)
RBC: 3.35 MIL/uL — ABNORMAL LOW (ref 3.87–5.11)
RBC: 3.33 MIL/uL — ABNORMAL LOW (ref 3.87–5.11)
RDW: 14.6 % (ref 11.5–15.5)
RDW: 14.5 % (ref 11.5–15.5)
WBC: 6.1 10*3/uL (ref 4.0–10.5)
WBC: 6.4 10*3/uL (ref 4.0–10.5)

## 2013-05-23 LAB — PREPARE RBC (CROSSMATCH)

## 2013-05-23 LAB — BASIC METABOLIC PANEL
BUN: 29 mg/dL — ABNORMAL HIGH (ref 6–23)
CO2: 32 mEq/L (ref 19–32)
Calcium: 9.8 mg/dL (ref 8.4–10.5)
Chloride: 90 mEq/L — ABNORMAL LOW (ref 96–112)
Creatinine, Ser: 1.41 mg/dL — ABNORMAL HIGH (ref 0.50–1.10)
GFR calc Af Amer: 44 mL/min — ABNORMAL LOW (ref >90)
GFR calc non Af Amer: 38 mL/min — ABNORMAL LOW (ref >90)
Glucose, Bld: 113 mg/dL — ABNORMAL HIGH (ref 70–99)
Potassium: 5.4 mEq/L — ABNORMAL HIGH (ref 3.5–5.1)
Sodium: 132 mEq/L — ABNORMAL LOW (ref 135–145)

## 2013-05-23 MED ORDER — PLASMA-LYTE 148 IV SOLN
INTRAVENOUS | Status: AC
  Administered 2013-05-24: 09:00:00
  Filled 2013-05-23: qty 2.5

## 2013-05-23 MED ORDER — FUROSEMIDE 40 MG PO TABS
40.0000 mg | ORAL_TABLET | Freq: Every day | ORAL | Status: DC
  Filled 2013-05-23: qty 1

## 2013-05-23 MED ORDER — METOPROLOL TARTRATE 12.5 MG HALF TABLET
12.5000 mg | ORAL_TABLET | Freq: Once | ORAL | Status: AC
  Administered 2013-05-24: 12.5 mg via ORAL
  Filled 2013-05-23: qty 1

## 2013-05-23 MED ORDER — DEXMEDETOMIDINE HCL IN NACL 400 MCG/100ML IV SOLN
0.1000 ug/kg/h | INTRAVENOUS | Status: AC
  Administered 2013-05-24: 0.7 ug/kg/h via INTRAVENOUS
  Filled 2013-05-23: qty 100

## 2013-05-23 MED ORDER — BISACODYL 5 MG PO TBEC
5.0000 mg | DELAYED_RELEASE_TABLET | Freq: Once | ORAL | Status: AC
  Administered 2013-05-23: 5 mg via ORAL
  Filled 2013-05-23: qty 1

## 2013-05-23 MED ORDER — DEXTROSE 5 % IV SOLN
750.0000 mg | INTRAVENOUS | Status: DC
  Filled 2013-05-23: qty 750

## 2013-05-23 MED ORDER — CHLORHEXIDINE GLUCONATE 4 % EX LIQD
60.0000 mL | Freq: Once | CUTANEOUS | Status: AC
  Administered 2013-05-23: 4 via TOPICAL
  Filled 2013-05-23: qty 60

## 2013-05-23 MED ORDER — EPINEPHRINE HCL 1 MG/ML IJ SOLN
0.5000 ug/min | INTRAVENOUS | Status: DC
  Filled 2013-05-23: qty 4

## 2013-05-23 MED ORDER — POTASSIUM CHLORIDE 2 MEQ/ML IV SOLN
80.0000 meq | INTRAVENOUS | Status: DC
  Filled 2013-05-23: qty 40

## 2013-05-23 MED ORDER — CHLORHEXIDINE GLUCONATE 4 % EX LIQD
60.0000 mL | Freq: Once | CUTANEOUS | Status: DC
  Filled 2013-05-23 (×2): qty 60

## 2013-05-23 MED ORDER — DEXTROSE 5 % IV SOLN
1.5000 g | INTRAVENOUS | Status: AC
  Administered 2013-05-24: 1.5 g via INTRAVENOUS
  Filled 2013-05-23: qty 1.5

## 2013-05-23 MED ORDER — PHENYLEPHRINE HCL 10 MG/ML IJ SOLN
30.0000 ug/min | INTRAVENOUS | Status: AC
  Administered 2013-05-24: 40 ug/min via INTRAVENOUS
  Filled 2013-05-23: qty 2

## 2013-05-23 MED ORDER — SODIUM CHLORIDE 0.9 % IV SOLN
INTRAVENOUS | Status: DC
  Filled 2013-05-23: qty 30

## 2013-05-23 MED ORDER — VANCOMYCIN HCL 10 G IV SOLR
1250.0000 mg | INTRAVENOUS | Status: AC
  Administered 2013-05-24: 1250 mg via INTRAVENOUS
  Filled 2013-05-23: qty 1250

## 2013-05-23 MED ORDER — TEMAZEPAM 15 MG PO CAPS: 15.0000 mg | ORAL_CAPSULE | Freq: Once | ORAL | Status: AC | PRN

## 2013-05-23 MED ORDER — SODIUM CHLORIDE 0.9 % IV SOLN
INTRAVENOUS | Status: AC
  Administered 2013-05-24: 69.8 mL/h via INTRAVENOUS
  Filled 2013-05-23: qty 40

## 2013-05-23 MED ORDER — NITROGLYCERIN IN D5W 200-5 MCG/ML-% IV SOLN
2.0000 ug/min | INTRAVENOUS | Status: AC
  Administered 2013-05-24: 5 ug/min via INTRAVENOUS
  Filled 2013-05-23: qty 250

## 2013-05-23 MED ORDER — SODIUM CHLORIDE 0.9 % IV SOLN
INTRAVENOUS | Status: AC
  Administered 2013-05-24: 2.1 [IU]/h via INTRAVENOUS
  Filled 2013-05-23: qty 1

## 2013-05-23 MED ORDER — DOPAMINE-DEXTROSE 3.2-5 MG/ML-% IV SOLN
2.0000 ug/kg/min | INTRAVENOUS | Status: DC
  Filled 2013-05-23: qty 250

## 2013-05-23 MED ORDER — MAGNESIUM SULFATE 50 % IJ SOLN
40.0000 meq | INTRAMUSCULAR | Status: DC
  Filled 2013-05-23: qty 10

## 2013-05-23 NOTE — Progress Notes (Signed)
NUTRITION FOLLOW UP  Intervention:   Continue Ensure Complete PO BID. No additional interventions at this time.  Nutrition Dx:   Inadequate oral intake now related to nausea as evidenced by reported decrease in appetite. Improving.  Goal:   Pt to meet >/= 90% of their estimated nutrition needs. Variable.  Monitor:   weight trends, lab trends, I/O's, PO intake, supplement tolerance  Assessment:   Pt admitted with c/o generalized weakness. She underwent a CT angiogram of chest PO protocol 7/30. Pt was diuresed. SP day 3 catheterization. Scheduled for mitral valve repair-replacement 8/7.   Underwent alveoloplasty and gross debridement of remaining dentition on 8/5.  Continues to undergo diuresis. Plan for valve replacement tomorrow.  Pt reports that her intake was good yesterday and this morning however she is currently having nausea that is preventing her from eating well. She states that she enjoys the Ensure that she is receiving and is able to drink it even though she is having nausea. She reports that the textures of her foods are not difficult for her to chew, given recent dental procedure.   Height: Ht Readings from Last 1 Encounters:  05/15/13 5\' 4"  ([id number] m)    Weight Status:   Wt Readings from Last 1 Encounters:  05/23/13 150 lb 2.1 oz (68.1 kg)  Stable  Re-estimated needs:  Kcal:  1700 - 1900 Protein: 90 - 100 g Fluid: 1.7 - 1.9 liters  Skin: lip incision  Diet Order: Cardiac   Intake/Output Summary (Last 24 hours) at 05/23/13 1155 Last data filed at 05/23/13 0849  Gross per 24 hour  Intake   1003 ml  Output   1650 ml  Net   -647 ml    Last BM: 8/5   Labs:   Recent Labs Lab 05/21/13 0500 05/22/13 0440 05/23/13 0655  NA 135 134* 133*  K 5.1 5.1 4.9  CL 94* 97 92*  CO2 29 26 30   BUN 38* 25* 25*  CREATININE 1.26* 1.13* 1.17*  CALCIUM 9.8 9.3 9.8  GLUCOSE 101* 105* 99    CBG (last 3)   Recent Labs  05/22/13 0752 05/22/13 1207  05/22/13 1556  GLUCAP 111* 145* 127*    Scheduled Meds: . aspirin EC  325 mg Oral Daily  . feeding supplement  237 mL Oral BID BM  . [START ON 05/24/2013] furosemide  40 mg Oral Daily  . hydrOXYzine  10 mg Oral QHS  . levothyroxine  100 mcg Oral QAC breakfast  . lisinopril  20 mg Oral BID  . metoprolol succinate  50 mg Oral Daily  . pantoprazole  40 mg Oral Daily  . PARoxetine  60 mg Oral Daily  . sodium chloride  3 mL Intravenous Q12H    Continuous Infusions: . lactated ringers 20 mL/hr (05/21/13 1254)  . sodium chloride irrigation      Inda Coke MS, RD, LDN Pager: [phone] After-hours pager: [phone]

## 2013-05-23 NOTE — Progress Notes (Signed)
[facility] Progress note  [name] [id number] DOB: August 20, 1946 DOA: 05/14/2013  Referring physician:  PCP: Ria Bush, MD  Specialists:   Chief Complaint: Increasing DOE, fatigue Assessment/Plan Principal Problem:   Chronic systolic heart failure Active Problems:   HYPOTHYROIDISM   HYPERLIPIDEMIA   HYPERTENSION   CARDIAC ARRHYTHMIA   Mitral regurgitation   Anxiety state, unspecified   Atrial fibrillation   CHF; patient unsure what her baseline weight.  We'll continue to diuresis patient. Continue Strict I/O and QD Weights. BNP= [id number]  Atrial fibrillation; currently not in A-Fib. Continue to hold Coumadin 2dary to possible surgery. Start patient on SCD for DVT  Prophylaxis   HLD; patient's cholesterol level within NCEP guidelines  Hypothyroidism; TSH= 9.99(high), free T4= 1.55(NL), patient therefore meets criteria for sub clinical hypothyroidism continue her Synthroid 100 mcg daily  Mitral Valve Regurgitation; Dr Darcey Nora (CTSurgery) has been contacted by Dr.Mohan [facility] (cardiology), . Plan for valve replacement on Friday 8/8. NOTE Dr. Jori Moll: Enrique Sack and performed extraction of indicated teeth with alveoloplasty and gross debridement of remaining dentition the operating room with general anesthesia    Code Status: Full Family Communication:  Disposition Plan: Per cardiology/CT surgery    Subjective: Pain controlled  Procedure;   CT angiogram of chest PE protocol 05/15/2013 1. Technically adequate study without pulmonary embolism.  2. Cardiomegaly, pleural effusions, pulmonary edema are all  consistent with CHF.      Past Medical History  Diagnosis Date  . Arthritis   . Hypertension   . A-fib   . High cholesterol   . Hypothyroidism   . Depression   . Shortness of breath     exertion  . Pacemaker     icd  . CHF (congestive heart failure)   . GERD (gastroesophageal reflux disease)   . [id number])    Past Surgical History   Procedure Laterality Date  . Cardiac defibrillator placement    . Abdominal hysterectomy    . Hernia repair  as child    x 2  . Tee without cardioversion  09/24/2012    Procedure: TRANSESOPHAGEAL ECHOCARDIOGRAM (TEE);  Surgeon: Fay Records, MD;  Location: [facility] ENDOSCOPY;  Service: Cardiovascular;  Laterality: N/A;  . Multiple extractions with alveoloplasty N/A 05/21/2013    Procedure: Extraction of tooth #'s 2,4,18 with alveoloplasty and gross debridement of remaining teeth;  Surgeon: Lenn Cal, DDS;  Location: [place];  Service: Oral Surgery;  Laterality: N/A;   Social History:  reports that she has never smoked. She has never used smokeless tobacco. She reports that she does not drink alcohol or use illicit drugs.   Allergies  Allergen Reactions  . Codeine Nausea And Vomiting  . Meperidine Hcl Nausea And Vomiting    History reviewed. No pertinent family history.   Prior to Admission medications   Medication Sig Start Date End Date Taking? Authorizing Provider  acetaminophen (TYLENOL) 500 MG tablet Take 1,000 mg by mouth every 6 (six) hours as needed. For pain   Yes Historical Provider, MD  aspirin EC 325 MG tablet Take 325 mg by mouth every other day.   Yes Historical Provider, MD  Aspirin-Acetaminophen-Caffeine (GOODY HEADACHE PO) Take 1 packet by mouth as needed. For pain   Yes Historical Provider, MD  diazepam (VALIUM) 2 MG tablet Take 2 mg by mouth 3 (three) times daily. For anxiety   Yes Historical Provider, MD  fish oil-omega-3 fatty acids 1000 MG capsule Take 1 g by mouth daily.   Yes Historical Provider, MD  furosemide (LASIX) 40 MG tablet Take 2 tablets (80 mg total) by mouth daily. 10/24/12  Yes Evans Lance, MD  hydrOXYzine (ATARAX/VISTARIL) 10 MG tablet Take 10 mg by mouth at bedtime.    Yes Historical Provider, MD  levothyroxine (SYNTHROID, LEVOTHROID) 88 MCG tablet Take 88 mcg by mouth daily.   Yes Historical Provider, MD  lisinopril (PRINIVIL,ZESTRIL) 10 MG tablet  Take 10 mg by mouth 2 (two) times daily.   Yes Historical Provider, MD  metoprolol succinate (TOPROL-XL) 50 MG 24 hr tablet Take 50 mg by mouth daily. Take with or immediately following a meal.   Yes Historical Provider, MD  pantoprazole (PROTONIX) 40 MG tablet Take 40 mg by mouth daily.   Yes Historical Provider, MD  PARoxetine (PAXIL) 30 MG tablet Take 60 mg by mouth daily.    Yes Historical Provider, MD  Polyvinyl Alcohol-Povidone (MURINE TEARS FOR DRY EYES OP) Place 1 drop into both eyes as needed. For dry eyes   Yes Historical Provider, MD  potassium chloride SA (KLOR-CON M20) 20 MEQ tablet Take 1 tablet (20 mEq total) by mouth daily. 07/03/12  Yes Evans Lance, MD  simvastatin (ZOCOR) 40 MG tablet Take 0.5 tablets (20 mg total) by mouth every evening. 09/20/12  Yes Evans Lance, MD  ondansetron (ZOFRAN) 4 MG tablet Take 1 tablet (4 mg total) by mouth every 6 (six) hours. 05/09/13   Rhunette Croft, MD   Physical Exam: Filed Vitals:   05/22/13 2146 05/22/13 2156 05/23/13 0634 05/23/13 0924  BP: 149/53 149/53 110/65 97/48  Pulse:  79 51 54  Temp:  98.2 F (36.8 C) 98.3 F (36.8 C)   TempSrc:  Oral Oral   Resp:  20 18   Height:      Weight:   68.1 kg (150 lb 2.1 oz)   SpO2:  96% 96%      General: Alert, having increased pain secondary to tell surgery requesting pain medication   Cardiovascular: Regular rhythm and rate, grade 4 holosystolic murmur, negative rubs or gallops, PD/TP pulses 2+ bilateral  Respiratory: Clear to auscultation bilaterally  Abdomen: Soft nontender plus bowel sounds    Labs on Admission:  Basic Metabolic Panel:  Recent Labs Lab 05/19/13 0555 05/20/13 0400 05/21/13 0500 05/22/13 0440 05/23/13 0655  NA 134* 135 135 134* 133*  K 4.0 4.0 5.1 5.1 4.9  CL 93* 95* 94* 97 92*  CO2 27 28 29 26 30   GLUCOSE 125* 109* 101* 105* 99  BUN 27* 33* 38* 25* 25*  CREATININE 1.28* 1.40* 1.26* 1.13* 1.17*  CALCIUM 9.6 9.4 9.8 9.3 9.8   Liver Function  Tests:  Recent Labs Lab 05/19/13 0555 05/20/13 0400 05/21/13 0500 05/22/13 0440 05/23/13 0655  AST 41* 35 39* 48* 40*  ALT 46* 37* 33 23 20  ALKPHOS 100 78 80 64 75  BILITOT 0.4 0.3 0.3 0.4 0.4  PROT 7.2 6.7 6.9 6.3 7.0  ALBUMIN 4.1 3.8 4.0 3.5 3.9   No results found for this basename: LIPASE, AMYLASE,  in the last 168 hours No results found for this basename: AMMONIA,  in the last 168 hours CBC:  Recent Labs Lab 05/23/13 0655  WBC 6.1  HGB 11.1*  HCT 33.4*  MCV 99.7  PLT 307   Cardiac Enzymes: No results found for this basename: CKTOTAL, CKMB, CKMBINDEX, TROPONINI,  in the last 168 hours  BNP (last 3 results)  Recent Labs  05/15/13 0200 05/16/13 0540  PROBNP 13764.0* 15859.0*  CBG:  Recent Labs Lab 05/22/13 0100 05/22/13 0344 05/22/13 0752 05/22/13 1207 05/22/13 1556  GLUCAP 113* 105* 111* 145* 127*    Radiological Exams on Admission: Dg Chest 2 View  05/23/2013   *RADIOLOGY REPORT*  Clinical Data: Cough.  Mitral regurgitation  CHEST - 2 VIEW  Comparison: 05/14/2013  Findings: Improved vascular congestion.  AICD is unchanged.  Lungs are clear without infiltrate or effusion or edema.  Negative for mass lesion.  IMPRESSION: Improved vascular congestion.  Lungs are clear.   Original Report Authenticated By: Carl Best, M.D.    EKG: Pending    Time spent: 35 minutes  Eliseo Squires [facility] Pager [phone]  If 7PM-7AM, please contact night-coverage [email or web address] Password [email or web address] 05/23/2013, 10:14 AM

## 2013-05-23 NOTE — Progress Notes (Signed)
The patient did not have any acute changes overnight.  Her pain remained controlled on PO Norco/Vicodin.

## 2013-05-23 NOTE — Progress Notes (Signed)
2 Days Post-Op Procedure(s) (LRB): Extraction of tooth #'s 2,4,18 with alveoloplasty and gross debridement of remaining teeth (N/A) Subjective: Class Moyer CHF with severe MR from posterior leaflet prolapse Patient having pain from recent dental extractions and associated nausea No worsening of CHF, maintaining sinus rhythm, afebrile Scheduled for mitral valve repair replacement in a.m. Review of procedure again with the patient including the plan for repairing the valve but not possible replacement with a tissue valve for her preference to avoid long-term Coumadin  Objective: Vital signs in last 24 hours: Temp:  [97.8 F (36.6 C)-98.3 F (36.8 C)] 97.8 F (36.6 C) (08/07 1341) Pulse Rate:  [51-79] 56 (08/07 1341) Cardiac Rhythm:  [-] Sinus bradycardia (08/07 0815) Resp:  [18-20] 20 (08/07 1341) BP: (84-149)/(48-65) 84/63 mmHg (08/07 1341) SpO2:  [96 %-98 %] 98 % (08/07 1341) Weight:  [150 lb 2.1 oz (68.1 kg)] 150 lb 2.1 oz (68.1 kg) (08/07 0634)  Hemodynamic parameters for last 24 hours:   stable  Intake/Output from previous day: 08/06 0701 - 08/07 0700 In: 1483 [P.O.:1480; I.V.:3] Out: 2150 [Urine:2150] Intake/Output this shift: Total I/O In: 240 [P.O.:240] Out: -   Exam No bleeding from mouth Holosystolic murmur left sternal border Lungs clear  Lab Results:  Recent Labs  05/23/13 0655  WBC 6.1  HGB 11.1*  HCT 33.4*  PLT 307   BMET:  Recent Labs  05/22/13 0440 05/23/13 0655  NA 134* 133*  K 5.1 4.9  CL 97 92*  CO2 26 30  GLUCOSE 105* 99  BUN 25* 25*  CREATININE 1.13* 1.17*  CALCIUM 9.3 9.8    PT/INR:  Recent Labs  05/21/13 0500  LABPROT 12.8  INR 0.98   ABG    Component Value Date/Time   PHART 7.356 09/06/2009 0420   HCO3 19.8* 09/06/2009 0420   TCO2 20.9 09/06/2009 0420   ACIDBASEDEF 4.7* 09/06/2009 0420   O2SAT 94.8 09/06/2009 0420   CBG (last 3)   Recent Labs  05/22/13 0752 05/22/13 1207 05/22/13 1556  GLUCAP 111* 145* 127*     Assessment/Plan: S/P Procedure(s) (LRB): Extraction of tooth #'s 2,4,18 with alveoloplasty and gross debridement of remaining teeth (N/A) Surgery in a.m. Mitral valve repair-replacement discussed in detail with patient and she agrees to proceed   LOS: 9 days    [name],[name] 05/23/2013

## 2013-05-23 NOTE — Progress Notes (Signed)
CARDIAC REHAB PHASE I   PRE:  Rate/Rhythm:   BP:  Sitting:      SaO2:   MODE:  Ambulation: 200 ft   POST:  Rate/Rhythm: 77 SR  BP:  Sitting: 110/64    SaO2: 97 RA  1130-1147 Pt was in hallway walking independently upon arrival.  Pt had walked 100 ft prior to arrival.  Pt finished walk with RW and assist x1.  Pt c/o of nausea during ambulation, no pain or no SOB.  Returned pt to recliner post ambulation with call button and a fresh cup of water.  Pt stated she was nervous about the procedure tomorrow and it was confirmed that she did not have any questions about her surgery.  Lillia Dallas MS, ACSM RCEP 11:48 AM 05/23/2013

## 2013-05-24 ENCOUNTER — Encounter (HOSPITAL_COMMUNITY): Payer: Self-pay | Admitting: Anesthesiology

## 2013-05-24 ENCOUNTER — Inpatient Hospital Stay (HOSPITAL_COMMUNITY): Payer: Medicare Other

## 2013-05-24 ENCOUNTER — Inpatient Hospital Stay (HOSPITAL_COMMUNITY): Payer: Medicare Other | Admitting: Anesthesiology

## 2013-05-24 ENCOUNTER — Encounter (HOSPITAL_COMMUNITY)
Admission: EM | Disposition: A | Discharge status: Home Health | Payer: Self-pay | Source: Home / Self Care | Attending: Cardiothoracic Surgery

## 2013-05-24 DIAGNOSIS — I059 Rheumatic mitral valve disease, unspecified: Secondary | ICD-10-CM

## 2013-05-24 HISTORY — PX: INTRAOPERATIVE TRANSESOPHAGEAL ECHOCARDIOGRAM: SHX5062

## 2013-05-24 HISTORY — PX: MITRAL VALVE REPAIR: SHX2039

## 2013-05-24 LAB — CBC
HCT: 33.8 % — ABNORMAL LOW (ref 36.0–46.0)
HCT: 30 % — ABNORMAL LOW (ref 36.0–46.0)
Hemoglobin: 11.2 g/dL — ABNORMAL LOW (ref 12.0–15.0)
Hemoglobin: 11.1 g/dL — ABNORMAL LOW (ref 12.0–15.0)
Hemoglobin: 10.5 g/dL — ABNORMAL LOW (ref 12.0–15.0)
MCH: 33.1 pg (ref 26.0–34.0)
MCH: 32.1 pg (ref 26.0–34.0)
MCHC: 34.4 g/dL (ref 30.0–36.0)
MCHC: 35 g/dL (ref 30.0–36.0)
MCV: 100 fL (ref 78.0–100.0)
MCV: 91.7 fL (ref 78.0–100.0)
Platelets: 129 10*3/uL — ABNORMAL LOW (ref 150–400)
Platelets: 143 10*3/uL — ABNORMAL LOW (ref 150–400)
RBC: 3.38 MIL/uL — ABNORMAL LOW (ref 3.87–5.11)
RBC: 3.27 MIL/uL — ABNORMAL LOW (ref 3.87–5.11)
RDW: 17.5 % — ABNORMAL HIGH (ref 11.5–15.5)
WBC: 5.6 10*3/uL (ref 4.0–10.5)
WBC: 9.5 10*3/uL (ref 4.0–10.5)

## 2013-05-24 LAB — POCT I-STAT, CHEM 8
BUN: 16 mg/dL (ref 6–23)
Creatinine, Ser: 1.1 mg/dL (ref 0.50–1.10)
Hemoglobin: 9.9 g/dL — ABNORMAL LOW (ref 12.0–15.0)
Potassium: 4.6 mEq/L (ref 3.5–5.1)
Sodium: 134 mEq/L — ABNORMAL LOW (ref 135–145)
TCO2: 24 mmol/L (ref 0–100)

## 2013-05-24 LAB — POCT I-STAT 3, ART BLOOD GAS (G3+)
Acid-base deficit: 3 mmol/L — ABNORMAL HIGH (ref 0.0–2.0)
Acid-base deficit: 3 mmol/L — ABNORMAL HIGH (ref 0.0–2.0)
Acid-base deficit: 3 mmol/L — ABNORMAL HIGH (ref 0.0–2.0)
Bicarbonate: 23.3 mEq/L (ref 20.0–24.0)
O2 Saturation: 100 %
O2 Saturation: 98 %
O2 Saturation: 98 %
O2 Saturation: 93 %
Patient temperature: 35.3
Patient temperature: 38.5
Patient temperature: 37.4
TCO2: 25 mmol/L (ref 0–100)
pCO2 arterial: 39.6 mmHg (ref 35.0–45.0)
pCO2 arterial: 43.7 mmHg (ref 35.0–45.0)
pH, Arterial: 7.424 (ref 7.350–7.450)
pO2, Arterial: 200 mmHg — ABNORMAL HIGH (ref 80.0–100.0)

## 2013-05-24 LAB — BLOOD GAS, ARTERIAL
Acid-Base Excess: 4.4 mmol/L — ABNORMAL HIGH (ref 0.0–2.0)
Bicarbonate: 28.4 mEq/L — ABNORMAL HIGH (ref 20.0–24.0)
Drawn by: 39898
FIO2: 0.21 %
O2 Saturation: 95.5 %
Patient temperature: 98.6
TCO2: 29.7 mmol/L (ref 0–100)
pCO2 arterial: 42.3 mmHg (ref 35.0–45.0)
pH, Arterial: 7.442 (ref 7.350–7.450)
pO2, Arterial: 77.3 mmHg — ABNORMAL LOW (ref 80.0–100.0)

## 2013-05-24 LAB — POCT I-STAT 4, (NA,K, GLUC, HGB,HCT)
Glucose, Bld: 130 mg/dL — ABNORMAL HIGH (ref 70–99)
Glucose, Bld: 236 mg/dL — ABNORMAL HIGH (ref 70–99)
HCT: 31 % — ABNORMAL LOW (ref 36.0–46.0)
HCT: 21 % — ABNORMAL LOW (ref 36.0–46.0)
HCT: 25 % — ABNORMAL LOW (ref 36.0–46.0)
Hemoglobin: 10.5 g/dL — ABNORMAL LOW (ref 12.0–15.0)
Hemoglobin: 7.1 g/dL — ABNORMAL LOW (ref 12.0–15.0)
Hemoglobin: 9.5 g/dL — ABNORMAL LOW (ref 12.0–15.0)
Hemoglobin: 7.5 g/dL — ABNORMAL LOW (ref 12.0–15.0)
Hemoglobin: 8.5 g/dL — ABNORMAL LOW (ref 12.0–15.0)
Potassium: 4.1 mEq/L (ref 3.5–5.1)
Potassium: 4.5 mEq/L (ref 3.5–5.1)
Potassium: 4.4 mEq/L (ref 3.5–5.1)
Sodium: 133 mEq/L — ABNORMAL LOW (ref 135–145)
Sodium: 128 mEq/L — ABNORMAL LOW (ref 135–145)

## 2013-05-24 LAB — PROTIME-INR
INR: 1.48 (ref 0.00–1.49)
Prothrombin Time: 17.5 seconds — ABNORMAL HIGH (ref 11.6–15.2)

## 2013-05-24 LAB — HEMOGLOBIN AND HEMATOCRIT, BLOOD
HCT: 20.6 % — ABNORMAL LOW (ref 36.0–46.0)
Hemoglobin: 7.1 g/dL — ABNORMAL LOW (ref 12.0–15.0)

## 2013-05-24 LAB — PLATELET COUNT: Platelets: 116 10*3/uL — ABNORMAL LOW (ref 150–400)

## 2013-05-24 LAB — PREPARE RBC (CROSSMATCH)

## 2013-05-24 LAB — COMPREHENSIVE METABOLIC PANEL
AST: 37 U/L (ref 0–37)
Albumin: 4.2 g/dL (ref 3.5–5.2)
BUN: 28 mg/dL — ABNORMAL HIGH (ref 6–23)
Creatinine, Ser: 1.44 mg/dL — ABNORMAL HIGH (ref 0.50–1.10)
Total Protein: 7.2 g/dL (ref 6.0–8.3)

## 2013-05-24 LAB — CREATININE, SERUM
Creatinine, Ser: 1.09 mg/dL (ref 0.50–1.10)
GFR calc Af Amer: 60 mL/min — ABNORMAL LOW (ref >90)
GFR calc non Af Amer: 52 mL/min — ABNORMAL LOW (ref >90)

## 2013-05-24 LAB — MAGNESIUM: Magnesium: 3.5 mg/dL — ABNORMAL HIGH (ref 1.5–2.5)

## 2013-05-24 LAB — POCT I-STAT GLUCOSE: Operator id: 3406

## 2013-05-24 SURGERY — REPAIR, MITRAL VALVE
Anesthesia: General | Site: Mouth | Wound class: Clean

## 2013-05-24 MED ORDER — LACTATED RINGERS IV SOLN
INTRAVENOUS | Status: DC | PRN
  Administered 2013-05-24 (×2): via INTRAVENOUS

## 2013-05-24 MED ORDER — HEPARIN SODIUM (PORCINE) 1000 UNIT/ML IJ SOLN
INTRAMUSCULAR | Status: DC | PRN
  Administered 2013-05-24: 25000 [IU] via INTRAVENOUS

## 2013-05-24 MED ORDER — ASPIRIN EC 325 MG PO TBEC
325.0000 mg | DELAYED_RELEASE_TABLET | Freq: Every day | ORAL | Status: DC
  Administered 2013-05-25 – 2013-05-29 (×5): 325 mg via ORAL
  Filled 2013-05-24 (×6): qty 1

## 2013-05-24 MED ORDER — BISACODYL 10 MG RE SUPP: 10.0000 mg | Freq: Every day | RECTAL | Status: DC

## 2013-05-24 MED ORDER — SODIUM CHLORIDE 0.9 % IV SOLN
INTRAVENOUS | Status: DC
  Administered 2013-05-24: 3.7 [IU]/h via INTRAVENOUS
  Administered 2013-05-25: 02:00:00 via INTRAVENOUS
  Filled 2013-05-24 (×2): qty 1

## 2013-05-24 MED ORDER — MIDAZOLAM HCL 5 MG/ML IJ SOLN
INTRAMUSCULAR | Status: DC | PRN
  Administered 2013-05-24: 3 mg via INTRAVENOUS
  Administered 2013-05-24 (×2): 1 mg via INTRAVENOUS
  Administered 2013-05-24: 3 mg via INTRAVENOUS
  Administered 2013-05-24 (×2): 1 mg via INTRAVENOUS

## 2013-05-24 MED ORDER — ALBUTEROL SULFATE HFA 108 (90 BASE) MCG/ACT IN AERS
INHALATION_SPRAY | RESPIRATORY_TRACT | Status: DC | PRN
  Administered 2013-05-24: 2 via RESPIRATORY_TRACT

## 2013-05-24 MED ORDER — SODIUM CHLORIDE 0.9 % IR SOLN
Status: DC | PRN
  Administered 2013-05-24: 6000 mL

## 2013-05-24 MED ORDER — POTASSIUM CHLORIDE 10 MEQ/50ML IV SOLN: 10.0000 meq | INTRAVENOUS | Status: AC

## 2013-05-24 MED ORDER — ACETAMINOPHEN 650 MG RE SUPP
650.0000 mg | Freq: Once | RECTAL | Status: AC
  Administered 2013-05-24: 650 mg via RECTAL
  Filled 2013-05-24: qty 1

## 2013-05-24 MED ORDER — ALBUMIN HUMAN 5 % IV SOLN
INTRAVENOUS | Status: DC | PRN
  Administered 2013-05-24: 12:00:00 via INTRAVENOUS

## 2013-05-24 MED ORDER — SODIUM CHLORIDE 0.9 % IJ SOLN
OROMUCOSAL | Status: DC | PRN
  Administered 2013-05-24 (×3): via TOPICAL

## 2013-05-24 MED ORDER — ALBUMIN HUMAN 5 % IV SOLN
250.0000 mL | INTRAVENOUS | Status: AC | PRN
  Administered 2013-05-24 (×2): 250 mL via INTRAVENOUS

## 2013-05-24 MED ORDER — CALCIUM CHLORIDE 10 % IV SOLN
INTRAVENOUS | Status: DC | PRN
  Administered 2013-05-24: 200 mg via INTRAVENOUS
  Administered 2013-05-24 (×2): 100 mg via INTRAVENOUS
  Administered 2013-05-24: 250 mg via INTRAVENOUS
  Administered 2013-05-24: 100 mg via INTRAVENOUS
  Administered 2013-05-24: 750 mg via INTRAVENOUS

## 2013-05-24 MED ORDER — OXYCODONE HCL 5 MG PO TABS
5.0000 mg | ORAL_TABLET | ORAL | Status: DC | PRN
  Administered 2013-05-24 – 2013-05-26 (×6): 10 mg via ORAL
  Filled 2013-05-24 (×7): qty 2

## 2013-05-24 MED ORDER — SODIUM BICARBONATE 4.2 % IV SOLN
INTRAVENOUS | Status: DC | PRN
  Administered 2013-05-24: 50 meq via INTRAVENOUS

## 2013-05-24 MED ORDER — DEXTROSE 5 % IV SOLN
0.7500 g | INTRAVENOUS | Status: DC | PRN
  Administered 2013-05-24: .75 g via INTRAVENOUS

## 2013-05-24 MED ORDER — MILRINONE IN DEXTROSE 20 MG/100ML IV SOLN
0.2500 ug/kg/min | INTRAVENOUS | Status: DC
  Filled 2013-05-24: qty 100

## 2013-05-24 MED ORDER — MILRINONE IN DEXTROSE 20 MG/100ML IV SOLN
0.3000 ug/kg/min | INTRAVENOUS | Status: DC
  Administered 2013-05-24: 0.2 ug/kg/min via INTRAVENOUS
  Administered 2013-05-24: 0.202 ug/kg/min via INTRAVENOUS
  Filled 2013-05-24 (×2): qty 100

## 2013-05-24 MED ORDER — METOPROLOL TARTRATE 25 MG/10 ML ORAL SUSPENSION
12.5000 mg | Freq: Two times a day (BID) | ORAL | Status: DC
  Filled 2013-05-24 (×7): qty 5

## 2013-05-24 MED ORDER — DEXTROSE 5 % IV SOLN
1.5000 g | Freq: Two times a day (BID) | INTRAVENOUS | Status: AC
  Administered 2013-05-24 – 2013-05-26 (×4): 1.5 g via INTRAVENOUS
  Filled 2013-05-24 (×5): qty 1.5

## 2013-05-24 MED ORDER — LACTATED RINGERS IV SOLN: 500.0000 mL | Freq: Once | INTRAVENOUS | Status: AC | PRN

## 2013-05-24 MED ORDER — BISACODYL 5 MG PO TBEC
10.0000 mg | DELAYED_RELEASE_TABLET | Freq: Every day | ORAL | Status: DC
  Administered 2013-05-28 – 2013-05-29 (×2): 10 mg via ORAL
  Filled 2013-05-24 (×2): qty 2

## 2013-05-24 MED ORDER — HEMOSTATIC AGENTS (NO CHARGE) OPTIME
TOPICAL | Status: DC | PRN
  Administered 2013-05-24 (×2): 1 via TOPICAL

## 2013-05-24 MED ORDER — DEXMEDETOMIDINE HCL IN NACL 200 MCG/50ML IV SOLN
0.1000 ug/kg/h | INTRAVENOUS | Status: DC
  Administered 2013-05-24: 0.7 ug/kg/h via INTRAVENOUS
  Filled 2013-05-24: qty 50

## 2013-05-24 MED ORDER — INSULIN REGULAR BOLUS VIA INFUSION
0.0000 [IU] | Freq: Three times a day (TID) | INTRAVENOUS | Status: DC
  Filled 2013-05-24: qty 10

## 2013-05-24 MED ORDER — MILRINONE LOAD VIA INFUSION
INTRAVENOUS | Status: DC | PRN
  Administered 2013-05-24: 3400 ug via INTRAVENOUS

## 2013-05-24 MED ORDER — ASPIRIN 81 MG PO CHEW: 324.0000 mg | CHEWABLE_TABLET | Freq: Every day | ORAL | Status: DC

## 2013-05-24 MED ORDER — FAMOTIDINE IN NACL 20-0.9 MG/50ML-% IV SOLN
20.0000 mg | Freq: Two times a day (BID) | INTRAVENOUS | Status: AC
  Administered 2013-05-24: 20 mg via INTRAVENOUS

## 2013-05-24 MED ORDER — ONDANSETRON HCL 4 MG/2ML IJ SOLN
4.0000 mg | Freq: Four times a day (QID) | INTRAMUSCULAR | Status: DC | PRN
  Administered 2013-05-25 – 2013-05-29 (×4): 4 mg via INTRAVENOUS
  Filled 2013-05-24 (×4): qty 2

## 2013-05-24 MED ORDER — METOPROLOL TARTRATE 12.5 MG HALF TABLET
12.5000 mg | ORAL_TABLET | Freq: Two times a day (BID) | ORAL | Status: DC
  Administered 2013-05-25 – 2013-05-27 (×3): 12.5 mg via ORAL
  Filled 2013-05-24 (×7): qty 1

## 2013-05-24 MED ORDER — DOCUSATE SODIUM 100 MG PO CAPS
200.0000 mg | ORAL_CAPSULE | Freq: Every day | ORAL | Status: DC
  Administered 2013-05-25 – 2013-05-29 (×4): 200 mg via ORAL
  Filled 2013-05-24 (×5): qty 2

## 2013-05-24 MED ORDER — DOPAMINE-DEXTROSE 3.2-5 MG/ML-% IV SOLN
0.0000 ug/kg/min | INTRAVENOUS | Status: DC
  Administered 2013-05-24: 3 ug/kg/min via INTRAVENOUS

## 2013-05-24 MED ORDER — NOREPINEPHRINE BITARTRATE 1 MG/ML IJ SOLN
2.0000 ug/min | INTRAVENOUS | Status: DC
  Administered 2013-05-24: 12 ug/min via INTRAVENOUS
  Administered 2013-05-24: 8 ug/min via INTRAVENOUS
  Administered 2013-05-24: 5 ug/min via INTRAVENOUS
  Filled 2013-05-24 (×3): qty 4

## 2013-05-24 MED ORDER — SODIUM CHLORIDE 0.45 % IV SOLN
INTRAVENOUS | Status: DC
  Administered 2013-05-24: 14:00:00 via INTRAVENOUS

## 2013-05-24 MED ORDER — SODIUM CHLORIDE 0.9 % IJ SOLN: 3.0000 mL | INTRAMUSCULAR | Status: DC | PRN

## 2013-05-24 MED ORDER — LACTATED RINGERS IV SOLN
INTRAVENOUS | Status: DC | PRN
  Administered 2013-05-24: 07:00:00 via INTRAVENOUS

## 2013-05-24 MED ORDER — MILRINONE IN DEXTROSE 20 MG/100ML IV SOLN
INTRAVENOUS | Status: DC | PRN
  Administered 2013-05-24: .3 ug/kg/min via INTRAVENOUS

## 2013-05-24 MED ORDER — VANCOMYCIN HCL IN DEXTROSE 1-5 GM/200ML-% IV SOLN
1000.0000 mg | Freq: Once | INTRAVENOUS | Status: AC
  Administered 2013-05-24: 1000 mg via INTRAVENOUS
  Filled 2013-05-24: qty 200

## 2013-05-24 MED ORDER — PROPOFOL 10 MG/ML IV BOLUS
INTRAVENOUS | Status: DC | PRN
  Administered 2013-05-24: 40 mg via INTRAVENOUS

## 2013-05-24 MED ORDER — PHENYLEPHRINE HCL 10 MG/ML IJ SOLN
0.0000 ug/min | INTRAVENOUS | Status: DC
  Filled 2013-05-24: qty 2

## 2013-05-24 MED ORDER — METOPROLOL TARTRATE 1 MG/ML IV SOLN
2.5000 mg | INTRAVENOUS | Status: DC | PRN
  Administered 2013-05-27: 2.5 mg via INTRAVENOUS
  Filled 2013-05-24: qty 5

## 2013-05-24 MED ORDER — NITROGLYCERIN IN D5W 200-5 MCG/ML-% IV SOLN: 0.0000 ug/min | INTRAVENOUS | Status: DC

## 2013-05-24 MED ORDER — SODIUM CHLORIDE 0.9 % IJ SOLN
3.0000 mL | Freq: Two times a day (BID) | INTRAMUSCULAR | Status: DC
  Administered 2013-05-25 – 2013-05-27 (×5): 3 mL via INTRAVENOUS

## 2013-05-24 MED ORDER — ACETAMINOPHEN 160 MG/5ML PO SOLN
1000.0000 mg | Freq: Four times a day (QID) | ORAL | Status: AC
  Filled 2013-05-24: qty 40

## 2013-05-24 MED ORDER — LACTATED RINGERS IV SOLN: INTRAVENOUS | Status: DC

## 2013-05-24 MED ORDER — HEMOSTATIC AGENTS (NO CHARGE) OPTIME
TOPICAL | Status: DC | PRN
  Administered 2013-05-24: 1 via TOPICAL

## 2013-05-24 MED ORDER — SODIUM CHLORIDE 0.9 % IV SOLN
INTRAVENOUS | Status: DC
  Administered 2013-05-24 – 2013-05-27 (×2): via INTRAVENOUS

## 2013-05-24 MED ORDER — MORPHINE SULFATE 2 MG/ML IJ SOLN
2.0000 mg | INTRAMUSCULAR | Status: DC | PRN
  Administered 2013-05-24 – 2013-05-25 (×2): 2 mg via INTRAVENOUS
  Administered 2013-05-25: 4 mg via INTRAVENOUS
  Administered 2013-05-25 (×2): 2 mg via INTRAVENOUS
  Administered 2013-05-25 – 2013-05-26 (×5): 4 mg via INTRAVENOUS
  Filled 2013-05-24: qty 2
  Filled 2013-05-24: qty 1
  Filled 2013-05-24 (×3): qty 2
  Filled 2013-05-24 (×3): qty 1
  Filled 2013-05-24 (×2): qty 2

## 2013-05-24 MED ORDER — METOPROLOL TARTRATE 1 MG/ML IV SOLN
INTRAVENOUS | Status: DC | PRN
  Administered 2013-05-24 (×2): 1 mg via INTRAVENOUS

## 2013-05-24 MED ORDER — SODIUM CHLORIDE 0.9 % IV SOLN: 250.0000 mL | INTRAVENOUS | Status: DC

## 2013-05-24 MED ORDER — MIDAZOLAM HCL 2 MG/2ML IJ SOLN: 2.0000 mg | INTRAMUSCULAR | Status: DC | PRN

## 2013-05-24 MED ORDER — ROCURONIUM BROMIDE 100 MG/10ML IV SOLN
INTRAVENOUS | Status: DC | PRN
  Administered 2013-05-24: 30 mg via INTRAVENOUS
  Administered 2013-05-24 (×2): 50 mg via INTRAVENOUS

## 2013-05-24 MED ORDER — MORPHINE SULFATE 2 MG/ML IJ SOLN: 1.0000 mg | INTRAMUSCULAR | Status: AC | PRN

## 2013-05-24 MED ORDER — AMIODARONE HCL IN DEXTROSE 360-4.14 MG/200ML-% IV SOLN
30.0000 mg/h | INTRAVENOUS | Status: DC
  Administered 2013-05-24: 60 mg/h via INTRAVENOUS
  Administered 2013-05-25 – 2013-05-26 (×3): 30 mg/h via INTRAVENOUS
  Filled 2013-05-24 (×10): qty 200

## 2013-05-24 MED ORDER — AMIODARONE HCL IN DEXTROSE 360-4.14 MG/200ML-% IV SOLN
60.0000 mg/h | INTRAVENOUS | Status: AC
  Administered 2013-05-24 (×2): 60 mg/h via INTRAVENOUS
  Filled 2013-05-24: qty 200

## 2013-05-24 MED ORDER — PROTAMINE SULFATE 10 MG/ML IV SOLN
INTRAVENOUS | Status: DC | PRN
  Administered 2013-05-24: 190 mg via INTRAVENOUS

## 2013-05-24 MED ORDER — ACETAMINOPHEN 500 MG PO TABS
1000.0000 mg | ORAL_TABLET | Freq: Four times a day (QID) | ORAL | Status: AC
  Administered 2013-05-24 – 2013-05-29 (×16): 1000 mg via ORAL
  Filled 2013-05-24 (×20): qty 2

## 2013-05-24 MED ORDER — FENTANYL CITRATE 0.05 MG/ML IJ SOLN
INTRAMUSCULAR | Status: DC | PRN
  Administered 2013-05-24: 50 ug via INTRAVENOUS
  Administered 2013-05-24: 250 ug via INTRAVENOUS
  Administered 2013-05-24 (×3): 150 ug via INTRAVENOUS
  Administered 2013-05-24: 50 ug via INTRAVENOUS
  Administered 2013-05-24: 100 ug via INTRAVENOUS

## 2013-05-24 MED ORDER — LIDOCAINE HCL (CARDIAC) 20 MG/ML IV SOLN
INTRAVENOUS | Status: DC | PRN
  Administered 2013-05-24: 80 mg via INTRAVENOUS

## 2013-05-24 MED ORDER — METOCLOPRAMIDE HCL 5 MG/ML IJ SOLN
10.0000 mg | Freq: Four times a day (QID) | INTRAMUSCULAR | Status: AC
  Administered 2013-05-24 – 2013-05-25 (×4): 10 mg via INTRAVENOUS
  Filled 2013-05-24 (×4): qty 2

## 2013-05-24 MED ORDER — SODIUM CHLORIDE 0.9 % IV SOLN
20.0000 ug | Freq: Once | INTRAVENOUS | Status: AC
  Administered 2013-05-24: 20 ug via INTRAVENOUS
  Filled 2013-05-24: qty 5

## 2013-05-24 MED ORDER — ACETAMINOPHEN 160 MG/5ML PO SOLN: 650.0000 mg | Freq: Once | ORAL | Status: AC

## 2013-05-24 MED ORDER — MAGNESIUM SULFATE 40 MG/ML IJ SOLN
4.0000 g | Freq: Once | INTRAMUSCULAR | Status: AC
  Administered 2013-05-24: 4 g via INTRAVENOUS
  Filled 2013-05-24: qty 100

## 2013-05-24 SURGICAL SUPPLY — 100 items
ADAPTER CARDIO PERF ANTE/RETRO (ADAPTER) ×4 IMPLANT
ADPR PRFSN 84XANTGRD RTRGD (ADAPTER) ×2
ANTEGRADE CPLG (MISCELLANEOUS) ×2 IMPLANT
APPLICATOR COTTON TIP 6IN STRL (MISCELLANEOUS) IMPLANT
ATTRACTOMAT 16X20 MAGNETIC DRP (DRAPES) ×4 IMPLANT
BAG DECANTER FOR FLEXI CONT (MISCELLANEOUS) ×4 IMPLANT
BLADE STERNUM SYSTEM 6 (BLADE) ×4 IMPLANT
BLADE SURG 11 STRL SS (BLADE) ×2 IMPLANT
BLADE SURG 12 STRL SS (BLADE) ×4 IMPLANT
BLADE SURG 15 STRL LF DISP TIS (BLADE) ×2 IMPLANT
BLADE SURG 15 STRL SS (BLADE) ×8
BOOT SUTURE AID YELLOW STND (SUTURE) IMPLANT
CANISTER SUCTION 2500CC (MISCELLANEOUS) ×4 IMPLANT
CANN PRFSN 3/8XRT ANG TPR 14 (MISCELLANEOUS) ×2
CANNULA ARTERIAL NVNT 3/8 20FR (MISCELLANEOUS) ×3 IMPLANT
CANNULA GUNDRY RCSP 15FR (MISCELLANEOUS) ×4 IMPLANT
CANNULA PRFSN 3/8XRT ANG TPR14 (MISCELLANEOUS) ×1 IMPLANT
CANNULA VEN MTL TIP RT (MISCELLANEOUS) ×4
CANNULA VENOUS LOW PROF 34X46 (CANNULA) ×2 IMPLANT
CANNULA VRC MALB SNGL STG 28FR (MISCELLANEOUS) IMPLANT
CATH ROBINSON RED A/P 18FR (CATHETERS) ×15 IMPLANT
CATH THORACIC 36FR RT ANG (CATHETERS) ×2 IMPLANT
CLOTH BEACON ORANGE TIMEOUT ST (SAFETY) ×4 IMPLANT
CONN 1/2X1/2X1/2  BEN (MISCELLANEOUS) ×2
CONN 1/2X1/2X1/2 BEN (MISCELLANEOUS) ×2 IMPLANT
CONN 3/8X1/2 ST GISH (MISCELLANEOUS) ×8 IMPLANT
CONT SPEC 4OZ CLIKSEAL STRL BL (MISCELLANEOUS) ×3 IMPLANT
COVER SURGICAL LIGHT HANDLE (MISCELLANEOUS) ×4 IMPLANT
CRADLE DONUT ADULT HEAD (MISCELLANEOUS) ×4 IMPLANT
DRAPE CARDIOVASCULAR INCISE (DRAPES) ×4
DRAPE PROXIMA HALF (DRAPES) ×3 IMPLANT
DRAPE SLUSH/WARMER DISC (DRAPES) ×4 IMPLANT
DRAPE SRG 135X102X78XABS (DRAPES) ×2 IMPLANT
DRSG AQUACEL AG ADV 3.5X10 (GAUZE/BANDAGES/DRESSINGS) ×4 IMPLANT
DRSG COVADERM 4X14 (GAUZE/BANDAGES/DRESSINGS) ×4 IMPLANT
ELECT BLADE 6.5 EXT (BLADE) ×4 IMPLANT
ELECT CAUTERY BLADE 6.4 (BLADE) ×4 IMPLANT
ELECT REM PT RETURN 9FT ADLT (ELECTROSURGICAL) ×8
ELECTRODE REM PT RTRN 9FT ADLT (ELECTROSURGICAL) ×4 IMPLANT
GLOVE BIO SURGEON STRL SZ 6 (GLOVE) ×9 IMPLANT
GLOVE BIO SURGEON STRL SZ 6.5 (GLOVE) IMPLANT
GLOVE BIO SURGEON STRL SZ7.5 (GLOVE) ×8 IMPLANT
GLOVE BIO SURGEONS STRL SZ 6.5 (GLOVE)
GLOVE BIOGEL PI IND STRL 7.0 (GLOVE) IMPLANT
GLOVE BIOGEL PI INDICATOR 7.0 (GLOVE) ×10
GOWN STRL NON-REIN LRG LVL3 (GOWN DISPOSABLE) ×22 IMPLANT
HEMOSTAT POWDER SURGIFOAM 1G (HEMOSTASIS) ×12 IMPLANT
HEMOSTAT SURGICEL 2X14 (HEMOSTASIS) ×4 IMPLANT
KIT BASIN OR (CUSTOM PROCEDURE TRAY) ×4 IMPLANT
KIT ROOM TURNOVER OR (KITS) ×4 IMPLANT
KIT SUCTION CATH 14FR (SUCTIONS) ×4 IMPLANT
LINE VENT (MISCELLANEOUS) ×2 IMPLANT
LOOP VESSEL SUPERMAXI WHITE (MISCELLANEOUS) ×2 IMPLANT
NS IRRIG 1000ML POUR BTL (IV SOLUTION) ×23 IMPLANT
PACK OPEN HEART (CUSTOM PROCEDURE TRAY) ×4 IMPLANT
PAD ARMBOARD 7.5X6 YLW CONV (MISCELLANEOUS) ×8 IMPLANT
RING PHYSIO MITRAL (Prosthesis & Implant Heart) ×2 IMPLANT
SET CARDIOPLEGIA MPS 5001102 (MISCELLANEOUS) ×2 IMPLANT
SPONGE GAUZE 4X4 12PLY (GAUZE/BANDAGES/DRESSINGS) ×8 IMPLANT
SUCKER WEIGHTED FLEX (MISCELLANEOUS) ×4 IMPLANT
SURGIFLO W/THROMBIN 8M KIT (HEMOSTASIS) ×6 IMPLANT
SUT BONE WAX W31G (SUTURE) ×4 IMPLANT
SUT ETHIBOND (SUTURE) ×10 IMPLANT
SUT ETHIBOND 2 0 SH (SUTURE) ×10 IMPLANT
SUT ETHIBOND 2 0 SH 36X2 (SUTURE) ×4 IMPLANT
SUT ETHIBOND 2 0 V4 (SUTURE) IMPLANT
SUT ETHIBOND 2 0V4 GREEN (SUTURE) IMPLANT
SUT ETHIBOND 2-0 RB-1 WHT (SUTURE) ×12 IMPLANT
SUT ETHIBOND 4 0 RB 1 (SUTURE) ×4 IMPLANT
SUT ETHIBOND 4 0 TF (SUTURE) IMPLANT
SUT ETHIBOND 5 0 C 1 30 (SUTURE) IMPLANT
SUT ETHIBOND NAB MH 2-0 36IN (SUTURE) ×2 IMPLANT
SUT PROLENE 3 0 RB 1 (SUTURE) ×4 IMPLANT
SUT PROLENE 3 0 SH 1 (SUTURE) ×10 IMPLANT
SUT PROLENE 3 0 SH DA (SUTURE) ×4 IMPLANT
SUT PROLENE 4 0 RB 1 (SUTURE) ×16
SUT PROLENE 4 0 SH DA (SUTURE) ×10 IMPLANT
SUT PROLENE 4-0 RB1 .5 CRCL 36 (SUTURE) ×6 IMPLANT
SUT PROLENE 5 0 C 1 36 (SUTURE) ×2 IMPLANT
SUT PROLENE 6 0 C 1 30 (SUTURE) ×10 IMPLANT
SUT PROLENE 7 0 BV 1 (SUTURE) ×9 IMPLANT
SUT SILK  1 MH (SUTURE) ×4
SUT SILK 1 MH (SUTURE) IMPLANT
SUT STEEL 6MS V (SUTURE) ×8 IMPLANT
SUT STEEL SZ 6 DBL 3X14 BALL (SUTURE) ×4 IMPLANT
SUT VIC AB 1 CTX 18 (SUTURE) ×2 IMPLANT
SUT VIC AB 1 CTX 36 (SUTURE) ×12
SUT VIC AB 1 CTX36XBRD ANBCTR (SUTURE) ×6 IMPLANT
SUT VIC AB 2-0 CT1 27 (SUTURE)
SUT VIC AB 2-0 CT1 TAPERPNT 27 (SUTURE) IMPLANT
SUT VIC AB 2-0 CTX 27 (SUTURE) IMPLANT
SUT VIC AB 3-0 X1 27 (SUTURE) IMPLANT
SUTURE E-PAK OPEN HEART (SUTURE) ×4 IMPLANT
SYSTEM SAHARA CHEST DRAIN ATS (WOUND CARE) ×4 IMPLANT
TOWEL OR 17X24 6PK STRL BLUE (TOWEL DISPOSABLE) ×8 IMPLANT
TOWEL OR 17X26 10 PK STRL BLUE (TOWEL DISPOSABLE) ×8 IMPLANT
TRAY FOLEY IC TEMP SENS 14FR (CATHETERS) ×4 IMPLANT
UNDERPAD 30X30 INCONTINENT (UNDERPADS AND DIAPERS) ×4 IMPLANT
VRC MALLEABLE SINGLE STG 28FR (MISCELLANEOUS) ×4
WATER STERILE IRR 1000ML POUR (IV SOLUTION) ×8 IMPLANT

## 2013-05-24 NOTE — Anesthesia Preprocedure Evaluation (Addendum)
Anesthesia Evaluation  Patient identified by MRN, date of birth, ID band Patient awake    Reviewed: Allergy & Precautions, H&P , NPO status , Patient's Chart, lab work & pertinent test results, reviewed documented beta blocker date and time   Airway Mallampati: II TM Distance: >3 FB Neck ROM: full    Dental  (+) Dental Advisory Given, Missing and Teeth Intact   Pulmonary shortness of breath,  + rhonchi         Cardiovascular hypertension, Pt. on home beta blockers +CHF + dysrhythmias Atrial Fibrillation + pacemaker + Cardiac Defibrillator + Valvular Problems/Murmurs MR Rhythm:Irregular Rate:Normal     Neuro/Psych  Headaches, Anxiety Depression    GI/Hepatic GERD-  Medicated and Controlled,  Endo/Other  Hypothyroidism   Renal/GU      Musculoskeletal   Abdominal   Peds  Hematology   Anesthesia Other Findings Pt upper front teeth appear to be intact.  Pt keeps head positioned a bit to the right and extended on neck due to scoliosis...  Reproductive/Obstetrics                          Anesthesia Physical Anesthesia Plan  ASA: III  Anesthesia Plan: General   Post-op Pain Management:    Induction: Intravenous  Airway Management Planned: Oral ETT  Additional Equipment: Arterial line, PA Cath and 3D TEE  Intra-op Plan:   Post-operative Plan: Post-operative intubation/ventilation  Informed Consent: I have reviewed the patients History and Physical, chart, labs and discussed the procedure including the risks, benefits and alternatives for the proposed anesthesia with the patient or authorized representative who has indicated his/her understanding and acceptance.     Plan Discussed with: CRNA and Surgeon  Anesthesia Plan Comments:         Anesthesia Quick Evaluation

## 2013-05-24 NOTE — Progress Notes (Signed)
Pt. Alert and oriented this am. No s/s of distress or discomfort noted. Pt. Denies pain. Pre-op orders completed for surgery. Consent done. Pts. Family at bedside. Pt. resting in room quietly. Call light within reach. Blood pressure 107/58, pulse 70, temperature 99.4 F (37.4 C), temperature source Oral, resp. rate 18, height 5\' 4"  (1.626 m), weight 67.767 kg (149 lb 6.4 oz), SpO2 98.00%. RN will continue to monitor pt. For changes in condition. [name], Katherine Roan

## 2013-05-24 NOTE — Progress Notes (Signed)
The patient was examined and preop studies reviewed. There has been no change from the prior exam and the patient is ready for surgery.  Plan mitral valve repair/replacement today on L [facility]

## 2013-05-24 NOTE — Transfer of Care (Signed)
Immediate Anesthesia Transfer of Care Note  Patient: [name]  Procedure(s) Performed: Procedure(s): MITRAL VALVE REPAIR (MVR) (N/A) INTRAOPERATIVE TRANSESOPHAGEAL ECHOCARDIOGRAM (N/A)  Patient Location: PACU  Anesthesia Type:General  Level of Consciousness: sedated, unresponsive and Patient remains intubated per anesthesia plan  Airway & Oxygen Therapy: Patient remains intubated per anesthesia plan and Patient placed on Ventilator (see vital sign flow sheet for setting)  Post-op Assessment: Report given to PACU RN and Post -op Vital signs reviewed and stable  Post vital signs: Reviewed and stable  Complications: No apparent anesthesia complications

## 2013-05-24 NOTE — Progress Notes (Signed)
Reported off to oncoming shift nurse, Herbie Baltimore, RN.  No acute distress noted.  Safety maintained.

## 2013-05-24 NOTE — Progress Notes (Signed)
Patient ID: [name], female   DOB: 1946/04/12, 67 y.o.   MRN: [id number] EVENING ROUNDS NOTE :     [address]       [place] [address]             [phone]                 Day of Surgery Procedure(s) (LRB): MITRAL VALVE REPAIR (MVR) (N/A) INTRAOPERATIVE TRANSESOPHAGEAL ECHOCARDIOGRAM (N/A)  Total Length of Stay:  LOS: 10 days  BP 95/51  Pulse 90  Temp(Src) 98.6 F (37 C) (Oral)  Resp 4  Ht 5\' 4"  (1.626 m)  Wt 149 lb 6.4 oz (67.767 kg)  BMI 25.63 kg/m2  SpO2 97%  .Intake/Output     08/08 0701 - 08/09 0700   P.O.    I.V. (mL/kg) 3786.3 (55.9)   Blood 1520   IV Piggyback 650   Total Intake(mL/kg) 5956.3 (87.9)   Urine (mL/kg/hr) 1750 (2)   Blood 1600 (1.9)   Chest Tube 155 (0.2)   Total Output 3505   Net +2451.3         . sodium chloride 20 mL/hr at 05/24/13 1415  . sodium chloride 10 mL/hr at 05/24/13 1430  . [START ON 05/25/2013] sodium chloride    . amiodarone (NEXTERONE PREMIX) 360 mg/200 mL dextrose 30 mg/hr (05/24/13 1800)  . dexmedetomidine Stopped (05/24/13 1830)  . DOPamine 3 mcg/kg/min (05/24/13 1415)  . insulin (NOVOLIN-R) infusion 6.1 Units/hr (05/24/13 1855)  . lactated ringers 20 mL/hr at 05/24/13 1415  . milrinone 0.2 mcg/kg/min (05/24/13 1415)  . nitroGLYCERIN    . norepinephrine (LEVOPHED) Adult infusion 10 mcg/min (05/24/13 1900)  . phenylephrine (NEO-SYNEPHRINE) Adult infusion       Lab Results  Component Value Date   WBC 7.8 05/24/2013   HGB 11.2* 05/24/2013   HCT 33.0* 05/24/2013   PLT 129* 05/24/2013   GLUCOSE 153* 05/24/2013   CHOL 156 05/15/2013   TRIG 140 05/15/2013   HDL 38* 05/15/2013   LDLCALC 90 05/15/2013   ALT 20 05/24/2013   AST 37 05/24/2013   NA 133* 05/24/2013   K 4.4 05/24/2013   CL 92* 05/24/2013   CREATININE 1.44* 05/24/2013   BUN 28* 05/24/2013   CO2 31 05/24/2013   TSH 9.996* 05/15/2013   INR 1.48 05/24/2013   HGBA1C 5.1 05/17/2013   Opens eyes but still too sleepy to wean vent yet Not bleeding On 50%  [email or web address]  Grace Isaac MD  Beeper [phone] Office [phone] 05/24/2013 7:39 PM

## 2013-05-24 NOTE — OR Nursing (Signed)
First call to SICU made at 1237 also called volunteer desk to have them notify family that the patient was off bypass, second call to SICU made at 1320

## 2013-05-24 NOTE — Progress Notes (Signed)
Patient had been taken to have valve replaced.  Will check back after surgery.  Eulogio Bear DO

## 2013-05-24 NOTE — Anesthesia Procedure Notes (Addendum)
Procedures

## 2013-05-24 NOTE — Brief Op Note (Signed)
05/14/2013 - 05/24/2013  11:29 AM  PATIENT:  [name]  67 y.o. female  PRE-OPERATIVE DIAGNOSIS:  mitral regurgitation  POST-OPERATIVE DIAGNOSIS:  mitral regurgitation  PROCEDURE:  Procedure(s):  MITRAL VALVE REPAIR (MVR)  -Triangular Resection of Posterior Leaflet P2 -Ring Annuloplasty with 28 mm Edwards Physio II RIng  INTRAOPERATIVE TRANSESOPHAGEAL ECHOCARDIOGRAM (N/A)  SURGEON:  Surgeon(s) and Role:    * Ivin Poot, MD - Primary  PHYSICIAN ASSISTANT: Erin Barrett PA-C  ANESTHESIA:   general  EBL:  Total I/O In: -  Out: 575 [Urine:575]  BLOOD ADMINISTERED: 1 Unit PRBC and CELLSAVER  DRAINS: Mediastinal chest drain   LOCAL MEDICATIONS USED:  NONE  SPECIMEN:  Source of Specimen:  Posterior Leaflet of Mitral Valve  DISPOSITION OF SPECIMEN:  Pathology  COUNTS:  YES  TOURNIQUET:  * No tourniquets in log *  DICTATION: .Dragon Dictation  PLAN OF CARE: Admit to inpatient   PATIENT DISPOSITION:  ICU - intubated and hemodynamically stable.   Delay start of Pharmacological VTE agent (>24hrs) due to surgical blood loss or risk of bleeding: yes

## 2013-05-24 NOTE — Anesthesia Postprocedure Evaluation (Signed)
  Anesthesia Post-op Note  Patient: [name]  Procedure(s) Performed: Procedure(s): MITRAL VALVE REPAIR (MVR) (N/A) INTRAOPERATIVE TRANSESOPHAGEAL ECHOCARDIOGRAM (N/A)  Patient Location: ICU  Anesthesia Type:General  Level of Consciousness: sedated, unresponsive and Patient remains intubated per anesthesia plan  Airway and Oxygen Therapy: Patient remains intubated per anesthesia plan  Post-op Pain: Unable to evaluate due to level of sedation  Post-op Assessment: Post-op Vital signs reviewed, Patient's Cardiovascular Status Stable and Respiratory Function Stable  Post-op Vital Signs: Reviewed and stable  Complications: No apparent anesthesia complications

## 2013-05-24 NOTE — Preoperative (Addendum)
Beta Blockers   Reason not to administer Beta Blockers:metoprolol held by nursing unit.  will evaluate and discuss need for intraop beta blocker with dr Chriss Driver.    Metoprolol given on induction

## 2013-05-24 NOTE — Procedures (Signed)
Extubation Procedure Note  Patient Details:   Name: [name] DOB: 09-26-1946 MRN: [id number]   Airway Documentation:     Evaluation  O2 sats: stable throughout Complications: No apparent complications Patient did tolerate procedure well. Bilateral Breath Sounds: Rhonchi;Diminished Suctioning: Airway Yes  Pt was extubated on 05/24/13 at 21:10. Pt NIF was -50 cmH20 and VC was 1.4L. Pt was extubated to 6L [place] and was able to state full name.   Dulcy Fanny 05/24/2013, 9:15 PM

## 2013-05-24 NOTE — Anesthesia Postprocedure Evaluation (Signed)
  Anesthesia Post-op Note  Patient: [name]  Procedure(s) Performed: Procedure(s): MITRAL VALVE REPAIR (MVR) (N/A) INTRAOPERATIVE TRANSESOPHAGEAL ECHOCARDIOGRAM (N/A)  Patient Location: ICU  Anesthesia Type:General  Level of Consciousness: Patient remains intubated per anesthesia plan  Airway and Oxygen Therapy: Patient remains intubated per anesthesia plan and Patient placed on Ventilator (see vital sign flow sheet for setting)  Post-op Pain: none  Post-op Assessment: Post-op Vital signs reviewed, Patient's Cardiovascular Status Stable, Respiratory Function Stable, Patent Airway, No signs of Nausea or vomiting and Pain level controlled  Post-op Vital Signs: stable  Complications: No apparent anesthesia complications

## 2013-05-24 NOTE — Progress Notes (Signed)
  Echocardiogram Echocardiogram Transesophageal has been performed.  Mauricio Po 05/24/2013, 8:48 AM

## 2013-05-25 ENCOUNTER — Inpatient Hospital Stay (HOSPITAL_COMMUNITY): Payer: Medicare Other

## 2013-05-25 LAB — BASIC METABOLIC PANEL
BUN: 15 mg/dL (ref 6–23)
CO2: 24 mEq/L (ref 19–32)
Chloride: 101 mEq/L (ref 96–112)
Creatinine, Ser: 1.02 mg/dL (ref 0.50–1.10)
Glucose, Bld: 98 mg/dL (ref 70–99)
Potassium: 4.1 mEq/L (ref 3.5–5.1)

## 2013-05-25 LAB — PREPARE FRESH FROZEN PLASMA: Unit division: 0

## 2013-05-25 LAB — CBC
HCT: 25.2 % — ABNORMAL LOW (ref 36.0–46.0)
Hemoglobin: 8.7 g/dL — ABNORMAL LOW (ref 12.0–15.0)
MCHC: 35.1 g/dL (ref 30.0–36.0)
MCV: 91.6 fL (ref 78.0–100.0)
MCV: 92.7 fL (ref 78.0–100.0)
Platelets: 101 10*3/uL — ABNORMAL LOW (ref 150–400)
RBC: 2.75 MIL/uL — ABNORMAL LOW (ref 3.87–5.11)
RDW: 17.6 % — ABNORMAL HIGH (ref 11.5–15.5)
WBC: 7.6 10*3/uL (ref 4.0–10.5)
WBC: 9 10*3/uL (ref 4.0–10.5)

## 2013-05-25 LAB — CREATININE, SERUM: GFR calc Af Amer: 58 mL/min — ABNORMAL LOW (ref >90)

## 2013-05-25 LAB — GLUCOSE, CAPILLARY
Glucose-Capillary: 109 mg/dL — ABNORMAL HIGH (ref 70–99)
Glucose-Capillary: 125 mg/dL — ABNORMAL HIGH (ref 70–99)
Glucose-Capillary: 135 mg/dL — ABNORMAL HIGH (ref 70–99)

## 2013-05-25 LAB — POCT I-STAT, CHEM 8
BUN: 12 mg/dL (ref 6–23)
Potassium: 4.4 mEq/L (ref 3.5–5.1)
Sodium: 133 mEq/L — ABNORMAL LOW (ref 135–145)
TCO2: 23 mmol/L (ref 0–100)

## 2013-05-25 LAB — MAGNESIUM: Magnesium: 2.3 mg/dL (ref 1.5–2.5)

## 2013-05-25 LAB — PREPARE PLATELET PHERESIS

## 2013-05-25 MED ORDER — INSULIN DETEMIR 100 UNIT/ML ~~LOC~~ SOLN
10.0000 [IU] | Freq: Once | SUBCUTANEOUS | Status: AC
  Administered 2013-05-25: 10 [IU] via SUBCUTANEOUS
  Filled 2013-05-25: qty 0.1

## 2013-05-25 MED ORDER — INSULIN ASPART 100 UNIT/ML ~~LOC~~ SOLN: 0.0000 [IU] | SUBCUTANEOUS | Status: DC

## 2013-05-25 MED ORDER — INSULIN ASPART 100 UNIT/ML ~~LOC~~ SOLN
0.0000 [IU] | SUBCUTANEOUS | Status: DC
  Administered 2013-05-25 (×2): 2 [IU] via SUBCUTANEOUS

## 2013-05-25 MED ORDER — FUROSEMIDE 10 MG/ML IJ SOLN
40.0000 mg | Freq: Once | INTRAMUSCULAR | Status: AC
  Administered 2013-05-25: 40 mg via INTRAVENOUS

## 2013-05-25 NOTE — Plan of Care (Signed)
Problem: Phase II Progression Outcomes Goal: Tolerates weaning with O2 Sat > 90 Outcome: Completed/Met Date Met:  05/25/13

## 2013-05-25 NOTE — Progress Notes (Signed)
Patient ID: [name], female   DOB: October 17, 1946, 67 y.o.   MRN: [id number] EVENING ROUNDS NOTE :     [address]       [place], [address]             [phone]                 1 Day Post-Op Procedure(s) (LRB): MITRAL VALVE REPAIR (MVR) (N/A) INTRAOPERATIVE TRANSESOPHAGEAL ECHOCARDIOGRAM (N/A)  Total Length of Stay:  LOS: 11 days  BP 118/66  Pulse 83  Temp(Src) 99 F (37.2 C) (Oral)  Resp 23  Ht 5\' 4"  (1.626 m)  Wt 169 lb 8.5 oz (76.9 kg)  BMI 29.09 kg/m2  SpO2 100%  .Intake/Output     08/09 0701 - 08/10 0700   P.O. 60   I.V. (mL/kg) 643.2 (8.4)   Blood    NG/GT    IV Piggyback 50   Total Intake(mL/kg) 753.2 (9.8)   Urine (mL/kg/hr) 1360 (1.4)   Blood    Chest Tube 95 (0.1)   Total Output 1455   Net -701.8         . sodium chloride 20 mL/hr at 05/24/13 1415  . sodium chloride 10 mL/hr at 05/24/13 1430  . sodium chloride    . amiodarone (NEXTERONE PREMIX) 360 mg/200 mL dextrose 30 mg/hr (05/25/13 1307)  . dexmedetomidine Stopped (05/24/13 1830)  . DOPamine 2 mcg/kg/min (05/25/13 1800)  . insulin (NOVOLIN-R) infusion Stopped (05/25/13 1400)  . lactated ringers 20 mL/hr at 05/24/13 1415  . nitroGLYCERIN    . norepinephrine (LEVOPHED) Adult infusion 1 mcg/min (05/25/13 1800)  . phenylephrine (NEO-SYNEPHRINE) Adult infusion       Lab Results  Component Value Date   WBC 9.0 05/25/2013   HGB 9.4* 05/25/2013   HGB 9.5* 05/25/2013   HCT 26.8* 05/25/2013   HCT 28.0* 05/25/2013   PLT 101* 05/25/2013   GLUCOSE 125* 05/25/2013   CHOL 156 05/15/2013   TRIG 140 05/15/2013   HDL 38* 05/15/2013   LDLCALC 90 05/15/2013   ALT 20 05/24/2013   AST 37 05/24/2013   NA 133* 05/25/2013   K 4.4 05/25/2013   CL 98 05/25/2013   CREATININE 1.12* 05/25/2013   CREATININE 1.20* 05/25/2013   BUN 12 05/25/2013   CO2 24 05/25/2013   TSH 9.996* 05/15/2013   INR 1.48 05/24/2013   HGBA1C 5.1 05/17/2013   Up to chair , still on low dose levophed and dopamine, bp good now   Grace Isaac  MD  Beeper [phone] Office [phone] 05/25/2013 7:41 PM

## 2013-05-25 NOTE — Progress Notes (Signed)
Up OOB to chair x 2 assist.  Safety maintained.  No acute distress noted.

## 2013-05-25 NOTE — Progress Notes (Signed)
Chest tube removed as ordered. Tolerated well. [name] unable to be removed at this time  secondary to low BP, and the need for vasoactive med titration as ordered. Therefore patient is unable to be ambulated or up OOB to chair.  Dangle at bedside successful.  No acute distress noted.

## 2013-05-25 NOTE — Progress Notes (Signed)
Patient ID: [name], female   DOB: November 26, 1945, 67 y.o.   MRN: [id number] TCTS DAILY ICU PROGRESS NOTE                   [address]            [place] [address]          [phone]   1 Day Post-Op Procedure(s) (LRB): MITRAL VALVE REPAIR (MVR) (N/A) INTRAOPERATIVE TRANSESOPHAGEAL ECHOCARDIOGRAM (N/A)  Total Length of Stay:  LOS: 11 days   Subjective: Awake , extubated neuro intact  Objective: Vital signs in last 24 hours: Temp:  [95.2 F (35.1 C)-100.6 F (38.1 C)] 99.9 F (37.7 C) (08/09 0830) Pulse Rate:  [75-107] 89 (08/09 0830) Cardiac Rhythm:  [-] Atrial fibrillation (08/09 0800) Resp:  [0-26] 21 (08/09 0830) BP: (74-134)/(46-100) 110/73 mmHg (08/09 0830) SpO2:  [92 %-100 %] 100 % (08/09 0830) Arterial Line BP: (77-164)/(33-77) 123/49 mmHg (08/09 0830) FiO2 (%):  [40 %-100 %] 40 % (08/08 2041) Weight:  [169 lb 8.5 oz (76.9 kg)] 169 lb 8.5 oz (76.9 kg) (08/09 0500)  Filed Weights   05/23/13 0634 05/24/13 0503 05/25/13 0500  Weight: 150 lb 2.1 oz (68.1 kg) 149 lb 6.4 oz (67.767 kg) 169 lb 8.5 oz (76.9 kg)    Weight change: 20 lb 2.1 oz (9.133 kg)   Hemodynamic parameters for last 24 hours: PAP: (31-48)/(12-26) 40/16 mmHg CO:  [4.7 L/min-8 L/min] 6.6 L/min CI:  [2.7 L/min/m2-4.6 L/min/m2] 3.9 L/min/m2  Intake/Output from previous day: 08/08 0701 - 08/09 0700 In: 7457 [I.V.:4757; Blood:1520; NG/GT:30; IV Piggyback:1150] Out: [id number] [Urine:2950; Blood:1600; Chest Tube:405]  Intake/Output this shift: Total I/O In: 244.9 [P.O.:60; I.V.:134.9; IV Piggyback:50] Out: 50 [Urine:30; Chest Tube:20]  Current Meds: Scheduled Meds: . acetaminophen  1,000 mg Oral Q6H   Or  . acetaminophen (TYLENOL) oral liquid 160 mg/5 mL  1,000 mg Per Tube Q6H  . aspirin EC  325 mg Oral Daily   Or  . aspirin  324 mg Per Tube Daily  . bisacodyl  10 mg Oral Daily   Or  . bisacodyl  10 mg Rectal Daily  . cefUROXime (ZINACEF)  IV  1.5 g Intravenous Q12H  .  docusate sodium  200 mg Oral Daily  . famotidine (PEPCID) IV  20 mg Intravenous Q12H  . hydrOXYzine  10 mg Oral QHS  . insulin regular  0-10 Units Intravenous TID WC  . levothyroxine  100 mcg Oral QAC breakfast  . metoCLOPramide (REGLAN) injection  10 mg Intravenous Q6H  . metoprolol tartrate  12.5 mg Oral BID   Or  . metoprolol tartrate  12.5 mg Per Tube BID  . PARoxetine  60 mg Oral Daily  . sodium chloride  3 mL Intravenous Q12H   Continuous Infusions: . sodium chloride 20 mL/hr at 05/24/13 1415  . sodium chloride 10 mL/hr at 05/24/13 1430  . sodium chloride    . amiodarone (NEXTERONE PREMIX) 360 mg/200 mL dextrose 30 mg/hr (05/25/13 0126)  . dexmedetomidine Stopped (05/24/13 1830)  . DOPamine 3 mcg/kg/min (05/24/13 1415)  . insulin (NOVOLIN-R) infusion 1.9 Units/hr (05/25/13 0800)  . lactated ringers 20 mL/hr at 05/24/13 1415  . milrinone 0.202 mcg/kg/min (05/24/13 2236)  . nitroGLYCERIN    . norepinephrine (LEVOPHED) Adult infusion Stopped (05/25/13 0900)  . phenylephrine (NEO-SYNEPHRINE) Adult infusion     PRN Meds:.albumin human, metoprolol, midazolam, morphine injection, ondansetron (ZOFRAN) IV, oxyCODONE, polyvinyl alcohol, sodium chloride  General appearance: alert and cooperative  Neurologic: intact Heart: irregularly irregular rhythm, no murmur of MR, + pericardial rub Lungs: diminished breath sounds bibasilar Abdomen: soft, non-tender; bowel sounds normal; no masses,  no organomegaly Extremities: extremities normal, atraumatic, no cyanosis or edema and Homans sign is negative, no sign of DVT Wound: dressing intact sternum stable  Lab Results: CBC: Recent Labs  05/24/13 1930 05/24/13 1947 05/25/13 0400  WBC 9.5  --  7.6  HGB 10.5* 9.9* 8.7*  HCT 30.0* 29.0* 25.2*  PLT 143*  --  109*   BMET:  Recent Labs  05/24/13 0420  05/24/13 1947 05/25/13 0400  NA 134*  < > 134* 134*  K 5.1  < > 4.6 4.1  CL 92*  --  100 101  CO2 31  --   --  24  GLUCOSE 119*  <  > 139* 98  BUN 28*  --  16 15  CREATININE 1.44*  < > 1.10 1.02  CALCIUM 10.1  --   --  8.9  < > = values in this interval not displayed.  PT/INR:  Recent Labs  05/24/13 1400  LABPROT 17.5*  INR 1.48   Radiology: Dg Chest Portable 1 View In Am  05/25/2013   *RADIOLOGY REPORT*  Clinical Data: Postoperative chest x-ray  PORTABLE CHEST - 1 VIEW  Comparison: Prior chest x-ray 05/24/2013  Findings: The patient has and extubated and the nasogastric tube removed.  Left IJ approach vascular sheath can vein a Swan-Ganz catheter of the heart.  The tip of the Swan is in unchanged position over the left main pulmonary artery.  Persistent but improving left basilar atelectasis and volume loss.  There is likely a small left effusion.  The right lung remains relatively well aerated.  Pulmonary vascular congestion bordering on mild edema is similar compared to prior.  Median sternotomy with evidence of mitral valve annuloplasty ring.  Right subclavian approach cardiac rhythm maintenance device with lead in unchanged position.  IMPRESSION:  1.  Persistent but improving left perihilar and basilar atelectasis. 2.  Small left pleural effusion 3.  Interval extubation and removal of nasogastric tube   Original Report Authenticated By: Jacqulynn Cadet, M.D.   Dg Chest Portable 1 View  05/24/2013   *RADIOLOGY REPORT*  Clinical Data: 67 year old female status post surgery.  PORTABLE CHEST - 1 VIEW  Comparison: 05/23/2013 and earlier.  Findings: Portable supine AP view 1417 hours.  Endotracheal tube tip at the level of clavicles.  Enteric tube courses to the abdomen, tip at the level of the gastric body. Stable right chest cardiac AICD.  Left IJ approach Swan-Ganz catheter, tip at the level of the main pulmonary artery.  New median sternotomy wires.  Mediastinal drain in place.  Epicardial pacer wire.  The patient is rotated to the left.  Left perihilar and lung base confluent opacity.  No pneumothorax or pulmonary edema  identified. No definite effusion.  IMPRESSION: 1.  Lines and tubes appear appropriately placed as above. 2.  Rotated view with confluent left perihilar and lung base opacity, could reflect aspiration in this setting.  Atelectasis is felt less likely.   Original Report Authenticated By: Roselyn Reef, M.D.     Assessment/Plan: S/P Procedure(s) (LRB): MITRAL VALVE REPAIR (MVR) (N/A) INTRAOPERATIVE TRANSESOPHAGEAL ECHOCARDIOGRAM (N/A) Mobilize Diuresis Diabetes control Continue foley due to diuresing patient and urinary output monitoring See progression orders Expected Acute  Blood - loss Anemia Afib, on Cordarone drip Wean milrinone and dopamine    Lyra Alaimo B 05/25/2013 9:21 AM

## 2013-05-25 NOTE — Progress Notes (Signed)
Okay to remove Swan per Dr. Prescott Gum.  No acute distress noted. Tolerated well.

## 2013-05-25 NOTE — Op Note (Signed)
NAMEYONG, HAASE NO.:  [id number]  MEDICAL RECORD NO.:  [id number]  LOCATION:  2311                         FACILITY:  [facility]  PHYSICIAN:  Ivin Poot, M.D.  DATE OF BIRTH:  April 07, 1946  DATE OF PROCEDURE:  05/24/2013 DATE OF DISCHARGE:                              OPERATIVE REPORT   OPERATION:  Mitral valve repair (triangular resection of prolapsed P2 segment with ring annuloplasty using a 28 mm Edwards Physio II ring, serial number [id number]).  PREOPERATIVE DIAGNOSIS:  Severe class III congestive heart failure from severe mitral regurgitation.  POSTOPERATIVE DIAGNOSIS:  Severe class III congestive heart failure from severe mitral regurgitation.  SURGEON:  Ivin Poot, M.D.  ASSISTANT:  Ellwood Handler, PA-C.  ANESTHESIA:  General by Dr. Leda Quail.  INDICATIONS:  The patient is a 67 year old Caucasian female who was admitted to the hospital with progressive symptoms of class IIIB heart failure and evidence of severe MR by exam and 2D echocardiogram. Cardiac catheterization demonstrated no significant coronary disease with LV dilatation and moderate pulmonary hypertension.  She was in sinus rhythm.  Mitral valve repair - replacement was recommended and a thoracic surgical evaluation was requested.  I examined the patient in her hospital room and reviewed results of the echo and cardiac cath with the patient and discussed the indications and expected benefits of mitral valve repair - replacement for treatment of her severe mitral regurgitation.  Prior to surgery, she underwent dental evaluation and dental extraction of some necrotic teeth, which she tolerated well.  During the course of several visits to the patient's room prior to surgery, I had reviewed the indications and expected benefits of mitral valve repair or replacement, the details of the procedure including the use of general anesthesia and cardiopulmonary bypass, the location of the  surgical incision, and the expected postoperative recovery.  I reviewed with the patient the plan to perform repair if possible, but if not possible then replacement with a bioprosthetic valve, which was the patient's preference.  I reviewed the potential risks to her of mitral valve repair - replacement including risks of stroke, MI, bleeding, blood transfusion requirement, recurrent mitral valve disease, and death.  After reviewing these issues, she demonstrated her understanding and agreed to proceed with the surgery under what I felt was an informed consent.  OPERATIVE FINDINGS: 1. Transesophageal echo showed severe mitral regurgitation from a     prolapsed P2.  No other significant valvular disease noted. 2. Successful repair of severe MR with a triangular resection of a     prolapsed P2 segment.  Oversewing a scalloped area of the3-leaflet,     and a 28 mm Physio II Edwards ring annuloplasty.  OPERATIVE PROCEDURE:  The patient was brought to the operating room and placed supine on the operating table where general anesthesia was induced.  A transesophageal echo probe was placed by the anesthesiologist, which confirmed the preoperative diagnosis of severe MR.  The patient was prepped and draped as a sterile field and a proper time-out was performed.  A sternal incision was made and the sternum was retracted.  The pericardium was opened and suspended.  Heparin was administered and pursestrings  were placed in the ascending aorta and right atrium.  After the ACT was documented as being therapeutic, the patient was cannulated and placed on cardiopulmonary bypass.  A second right atrial venous cannula was placed for bicaval drainage and caval tapes were placed.  The interatrial groove was dissected.  Cardioplegia cannulas were placed for both antegrade and retrograde cold blood cardioplegia.  The patient was cooled to 32 degrees and aortic cross-clamp was applied.  1 L of cold blood  cardioplegia was delivered in split doses between the antegrade aortic and retrograde coronary sinus catheters.  There was good cardioplegic arrest and septal temperature dropped to less than 12 degrees.  Cardioplegia was delivered every 20 minutes.  A left atrial anatomy was performed and atrial retractors were positioned.  Exposure of the valve was difficult due to her small body size, but large heart size, and the mitral valve annulus was in a tunnel surrounded by endocardial tissue.  The valve was inspected and analyzed.  The anterior leaflet was with intact cords.  The posterior leaflet had prolapsed as well as some ruptured primary chords to the P2 segment.  Next, annuloplasty sutures were placed around the annulus at the trigones and at the 6 o'clock position for exposure of the valve.  Next, the P2 segment was excised using a triangular technique so as not to alter the height of the posterior leaflet, but to remove the abnormal tissue from fibroelastic deficiency to Pathology.  The leaflet was excised and then submitted for Pathology.  Next, the 2 edges of the posterior leaflet were reapproximated using interrupted 4-0 Ethibond sutures with the knots being placed on the ventricular side to avoid leaflet trauma.  Next, the annular sutures for the annuloplasty were placed around the total circumference numbering 18 sutures.  Next, the anterior leaflet was sized to a 28 mm Physio II ring.  The ring was selected and the sutures were placed through the ring.  The ring was seated and the sutures were all tied.  The valve was tested with saline and there was good coaptation with minimal leak that occurred at the scallop and the A3 segment of the anterior leaflet.  The scallop was plicated with some interrupted 5-0 Ethibond sutures and the valve was retested was perfectly competent.  The operative field had been filled with CO2 during the procedure.  Retractors were removed and  the atriotomy was closed in 2 layers using running 3-0 Prolene.  Prior to releasing the crossclamp, air was removed from the heart using the usual de-airing maneuvers as well as a dose of retrograde warm blood cardioplegia.  The atriotomy incision was tied and the clamp was removed.  The heart resumed a spontaneous rhythm.  A second layer was used to over- sew the atriotomy.  The patient was rewarmed and reperfused.  Temporary pacing wires were applied.  The lungs re-expanded and the ventilator was resumed.  When the patient was adequately reperfused and adequately rewarmed, she was started on low-dose dopamine and milrinone.  She weaned off cardiopulmonary bypass.  The transesophageal echo showed successful repair with no residual mitral regurgitation.  There was no evidence of mitral stenosis or anterior systolic motion of the anterior leaflet.  Protamine was slowly administered.  The patient required 2 units of packed cells during the surgery for a hematocrit of 19.  The patient remained hemodynamically stable with Neo-Synephrine adjustment to keep SVR adequate.  Cardiac output and index were excellent.  The patient required atrial pacing at times,  but then maintained a sinus rhythm.  The retractor was removed.  Hemostasis was obtained.  An anterior mediastinal chest tube was placed and brought out through separate incision.  The sternum was closed with wire.  The pectoralis fascia was closed in running #1 Vicryl, and skin was closed with a subcuticular. Total cardiopulmonary bypass time was 156 minutes.     Ivin Poot, M.D.     PV/MEDQ  D:  05/24/2013  T:  05/25/2013  Job:  [id number]  cc:   Allegra Lai. Terrence Dupont, M.D.

## 2013-05-26 ENCOUNTER — Inpatient Hospital Stay (HOSPITAL_COMMUNITY): Payer: Medicare Other

## 2013-05-26 LAB — CBC
HCT: 27.8 % — ABNORMAL LOW (ref 36.0–46.0)
Hemoglobin: 9.5 g/dL — ABNORMAL LOW (ref 12.0–15.0)
MCH: 31.7 pg (ref 26.0–34.0)
MCHC: 34.2 g/dL (ref 30.0–36.0)
MCV: 92.7 fL (ref 78.0–100.0)
Platelets: 102 10*3/uL — ABNORMAL LOW (ref 150–400)
RBC: 3 MIL/uL — ABNORMAL LOW (ref 3.87–5.11)
RDW: 17 % — ABNORMAL HIGH (ref 11.5–15.5)
WBC: 10.4 10*3/uL (ref 4.0–10.5)

## 2013-05-26 LAB — GLUCOSE, CAPILLARY
Glucose-Capillary: 102 mg/dL — ABNORMAL HIGH (ref 70–99)
Glucose-Capillary: 102 mg/dL — ABNORMAL HIGH (ref 70–99)
Glucose-Capillary: 110 mg/dL — ABNORMAL HIGH (ref 70–99)
Glucose-Capillary: 125 mg/dL — ABNORMAL HIGH (ref 70–99)
Glucose-Capillary: 145 mg/dL — ABNORMAL HIGH (ref 70–99)
Glucose-Capillary: 162 mg/dL — ABNORMAL HIGH (ref 70–99)
Glucose-Capillary: 88 mg/dL (ref 70–99)
Glucose-Capillary: 90 mg/dL (ref 70–99)
Glucose-Capillary: 119 mg/dL — ABNORMAL HIGH (ref 70–99)
Glucose-Capillary: 104 mg/dL — ABNORMAL HIGH (ref 70–99)
Glucose-Capillary: 109 mg/dL — ABNORMAL HIGH (ref 70–99)

## 2013-05-26 LAB — BASIC METABOLIC PANEL
BUN: 14 mg/dL (ref 6–23)
CO2: 26 mEq/L (ref 19–32)
Calcium: 9.1 mg/dL (ref 8.4–10.5)
Chloride: 94 mEq/L — ABNORMAL LOW (ref 96–112)
Creatinine, Ser: 0.95 mg/dL (ref 0.50–1.10)
GFR calc Af Amer: 71 mL/min — ABNORMAL LOW (ref >90)
GFR calc non Af Amer: 62 mL/min — ABNORMAL LOW (ref >90)
Glucose, Bld: 99 mg/dL (ref 70–99)
Potassium: 4.5 mEq/L (ref 3.5–5.1)
Sodium: 129 mEq/L — ABNORMAL LOW (ref 135–145)

## 2013-05-26 MED ORDER — AMIODARONE HCL 200 MG PO TABS
200.0000 mg | ORAL_TABLET | Freq: Two times a day (BID) | ORAL | Status: DC
  Administered 2013-05-26 – 2013-05-27 (×3): 200 mg via ORAL
  Filled 2013-05-26 (×4): qty 1

## 2013-05-26 MED ORDER — AMIODARONE HCL 200 MG PO TABS: 200.0000 mg | ORAL_TABLET | Freq: Every day | ORAL | Status: DC

## 2013-05-26 MED ORDER — FUROSEMIDE 10 MG/ML IJ SOLN
40.0000 mg | Freq: Once | INTRAMUSCULAR | Status: AC
  Administered 2013-05-26: 40 mg via INTRAVENOUS
  Filled 2013-05-26: qty 4

## 2013-05-26 MED ORDER — ENOXAPARIN SODIUM 30 MG/0.3ML ~~LOC~~ SOLN
30.0000 mg | SUBCUTANEOUS | Status: DC
  Administered 2013-05-26 – 2013-05-31 (×5): 30 mg via SUBCUTANEOUS
  Filled 2013-05-26 (×6): qty 0.3

## 2013-05-26 NOTE — Progress Notes (Signed)
Patient ID: [name], female   DOB: [birth year], 67 y.o.   MRN: [id number] TCTS DAILY ICU PROGRESS NOTE                   [address]            [place] [address]          [phone]   2 Days Post-Op Procedure(s) (LRB): MITRAL VALVE REPAIR (MVR) (N/A) INTRAOPERATIVE TRANSESOPHAGEAL ECHOCARDIOGRAM (N/A)  Total Length of Stay:  LOS: 12 days   Subjective: Up in chair , walking with help Objective: Vital signs in last 24 hours: Temp:  [98.2 F (36.8 C)-99.7 F (37.6 C)] 98.8 F (37.1 C) (08/10 0802) Pulse Rate:  [69-89] 78 (08/10 0900) Cardiac Rhythm:  [-] Atrial fibrillation (08/10 0800) Resp:  [15-28] 17 (08/10 0900) BP: (72-128)/(18-103) 106/51 mmHg (08/10 0900) SpO2:  [94 %-100 %] 97 % (08/10 0900) Arterial Line BP: (94-138)/(44-61) 106/59 mmHg (08/09 2300) Weight:  [167 lb 15.9 oz (76.2 kg)] 167 lb 15.9 oz (76.2 kg) (08/10 0500)  Filed Weights   05/24/13 0503 05/25/13 0500 05/26/13 0500  Weight: 149 lb 6.4 oz (67.767 kg) 169 lb 8.5 oz (76.9 kg) 167 lb 15.9 oz (76.2 kg)    Weight change: -1 lb 8.7 oz (-0.7 kg)   Hemodynamic parameters for last 24 hours: PAP: (36-51)/(12-22) 40/15 mmHg  Intake/Output from previous day: 08/09 0701 - 08/10 0700 In: 1381.9 [P.O.:60; I.V.:1221.9; IV Piggyback:100] Out: 1955 [Urine:1860; Chest Tube:95]  Intake/Output this shift: Total I/O In: 248.4 [P.O.:120; I.V.:78.4; IV Piggyback:50] Out: 25 [Urine:25]  Current Meds: Scheduled Meds: . acetaminophen  1,000 mg Oral Q6H   Or  . acetaminophen (TYLENOL) oral liquid 160 mg/5 mL  1,000 mg Per Tube Q6H  . aspirin EC  325 mg Oral Daily   Or  . aspirin  324 mg Per Tube Daily  . bisacodyl  10 mg Oral Daily   Or  . bisacodyl  10 mg Rectal Daily  . docusate sodium  200 mg Oral Daily  . hydrOXYzine  10 mg Oral QHS  . insulin aspart  0-24 Units Subcutaneous Q4H  . insulin regular  0-10 Units Intravenous TID WC  . levothyroxine  100 mcg Oral QAC breakfast  .  metoprolol tartrate  12.5 mg Oral BID   Or  . metoprolol tartrate  12.5 mg Per Tube BID  . PARoxetine  60 mg Oral Daily  . sodium chloride  3 mL Intravenous Q12H   Continuous Infusions: . sodium chloride 20 mL/hr at 05/24/13 1415  . sodium chloride 10 mL/hr at 05/24/13 1430  . sodium chloride    . amiodarone (NEXTERONE PREMIX) 360 mg/200 mL dextrose 30 mg/hr (05/26/13 0600)  . dexmedetomidine Stopped (05/24/13 1830)  . DOPamine 2 mcg/kg/min (05/26/13 0600)  . insulin (NOVOLIN-R) infusion Stopped (05/25/13 1400)  . lactated ringers 20 mL/hr at 05/24/13 1415  . nitroGLYCERIN    . norepinephrine (LEVOPHED) Adult infusion Stopped (05/25/13 2000)  . phenylephrine (NEO-SYNEPHRINE) Adult infusion     PRN Meds:.metoprolol, morphine injection, ondansetron (ZOFRAN) IV, oxyCODONE, polyvinyl alcohol, sodium chloride  General appearance: alert and cooperative Neurologic: intact Heart: irregularly irregular rhythm, no murmur of MR, + pericardial rub Lungs: diminished breath sounds bibasilar Abdomen: soft, non-tender; bowel sounds normal; no masses,  no organomegaly Extremities: extremities normal, atraumatic, no cyanosis or edema and Homans sign is negative, no sign of DVT Wound: dressing intact sternum stable  Lab Results: CBC:  Recent Labs  05/25/13  1622 05/26/13 0400  WBC 9.0 10.4  HGB 9.4*  9.5* 9.5*  HCT 26.8*  28.0* 27.8*  PLT 101* 102*   BMET:   Recent Labs  05/25/13 0400 05/25/13 1622 05/26/13 0400  NA 134* 133* 129*  K 4.1 4.4 4.5  CL 101 98 94*  CO2 24  --  26  GLUCOSE 98 125* 99  BUN 15 12 14   CREATININE 1.02 1.12*  1.20* 0.95  CALCIUM 8.9  --  9.1    PT/INR:   Recent Labs  05/24/13 1400  LABPROT 17.5*  INR 1.48   Radiology: Dg Chest Port 1 View  05/26/2013   *RADIOLOGY REPORT*  Clinical Data: Status post mitral valve replacement; postoperative radiograph.  PORTABLE CHEST - 1 VIEW  Comparison: Chest radiograph performed 05/25/2013  Findings: There has  been interval removal of the patient's mediastinal drains and Swan-Ganz catheter.  The left IJ sheath is noted ending at the left brachiocephalic vein.  The lungs remain hypoexpanded.  Retrocardiac airspace opacification likely reflects atelectasis; mildly worsened right basilar airspace opacity likely also reflects atelectasis.  Mild vascular congestion is noted.  No definite pleural effusion or pneumothorax is seen.  The cardiomediastinal silhouette is mildly enlarged.  The patient is status post median sternotomy.  A mitral valve replacement is noted.  An AICD is seen overlying the right chest wall, with a single lead ending overlying the right ventricle.  No acute osseous abnormalities are identified.  IMPRESSION:  1.  Lungs remain hypoexpanded; retrocardiac airspace opacification likely reflects atelectasis.  Mildly worsened right basilar airspace opacity likely also reflects atelectasis.  Would assess for symptoms of pneumonia. 2.  Mild patchy congestion and mild cardiomegaly noted.   Original Report Authenticated By: Santa Lighter, M.D.   Dg Chest Portable 1 View In Am  05/25/2013   *RADIOLOGY REPORT*  Clinical Data: Postoperative chest x-ray  PORTABLE CHEST - 1 VIEW  Comparison: Prior chest x-ray 05/24/2013  Findings: The patient has and extubated and the nasogastric tube removed.  Left IJ approach vascular sheath can vein a Swan-Ganz catheter of the heart.  The tip of the Swan is in unchanged position over the left main pulmonary artery.  Persistent but improving left basilar atelectasis and volume loss.  There is likely a small left effusion.  The right lung remains relatively well aerated.  Pulmonary vascular congestion bordering on mild edema is similar compared to prior.  Median sternotomy with evidence of mitral valve annuloplasty ring.  Right subclavian approach cardiac rhythm maintenance device with lead in unchanged position.  IMPRESSION:  1.  Persistent but improving left perihilar and basilar  atelectasis. 2.  Small left pleural effusion 3.  Interval extubation and removal of nasogastric tube   Original Report Authenticated By: Jacqulynn Cadet, M.D.   Dg Chest Portable 1 View  05/24/2013   *RADIOLOGY REPORT*  Clinical Data: 67 year old female status post surgery.  PORTABLE CHEST - 1 VIEW  Comparison: 05/23/2013 and earlier.  Findings: Portable supine AP view 1417 hours.  Endotracheal tube tip at the level of clavicles.  Enteric tube courses to the abdomen, tip at the level of the gastric body. Stable right chest cardiac AICD.  Left IJ approach Swan-Ganz catheter, tip at the level of the main pulmonary artery.  New median sternotomy wires.  Mediastinal drain in place.  Epicardial pacer wire.  The patient is rotated to the left.  Left perihilar and lung base confluent opacity.  No pneumothorax or pulmonary edema identified. No definite effusion.  IMPRESSION: 1.  Lines and tubes appear appropriately placed as above. 2.  Rotated view with confluent left perihilar and lung base opacity, could reflect aspiration in this setting.  Atelectasis is felt less likely.   Original Report Authenticated By: Roselyn Reef, M.D.     Assessment/Plan: S/P Procedure(s) (LRB): MITRAL VALVE REPAIR (MVR) (N/A) INTRAOPERATIVE TRANSESOPHAGEAL ECHOCARDIOGRAM (N/A) Mobilize Diuresis Diabetes control D/c  foley` Expected Acute  Blood - loss Anemia Afib, on Cordarone drip change to po Wean  dopamine off     Dearl Rudden B 05/26/2013 9:45 AM

## 2013-05-26 NOTE — Progress Notes (Signed)
Patient ID: [name], female   DOB: October 08, 1946, 67 y.o.   MRN: [id number] EVENING ROUNDS NOTE :     [address]       [place] [address]             [phone]                 2 Days Post-Op Procedure(s) (LRB): MITRAL VALVE REPAIR (MVR) (N/A) INTRAOPERATIVE TRANSESOPHAGEAL ECHOCARDIOGRAM (N/A)  Total Length of Stay:  LOS: 12 days  BP 101/56  Pulse 71  Temp(Src) 97.8 F (36.6 C) (Oral)  Resp 16  Ht 5\' 4"  (1.626 m)  Wt 167 lb 15.9 oz (76.2 kg)  BMI 28.82 kg/m2  SpO2 99%  .Intake/Output     08/10 0701 - 08/11 0700   P.O. 600   I.V. (mL/kg) 291.8 (3.8)   IV Piggyback 50   Total Intake(mL/kg) 941.8 (12.4)   Urine (mL/kg/hr) 460 (0.5)   Chest Tube    Total Output 460   Net +481.8         . sodium chloride 20 mL/hr at 05/24/13 1415  . sodium chloride 10 mL/hr at 05/24/13 1430  . sodium chloride    . dexmedetomidine Stopped (05/24/13 1830)  . insulin (NOVOLIN-R) infusion Stopped (05/25/13 1400)  . lactated ringers 20 mL/hr at 05/24/13 1415  . nitroGLYCERIN    . norepinephrine (LEVOPHED) Adult infusion Stopped (05/25/13 2000)  . phenylephrine (NEO-SYNEPHRINE) Adult infusion       Lab Results  Component Value Date   WBC 10.4 05/26/2013   HGB 9.5* 05/26/2013   HCT 27.8* 05/26/2013   PLT 102* 05/26/2013   GLUCOSE 99 05/26/2013   CHOL 156 05/15/2013   TRIG 140 05/15/2013   HDL 38* 05/15/2013   LDLCALC 90 05/15/2013   ALT 20 05/24/2013   AST 37 05/24/2013   NA 129* 05/26/2013   K 4.5 05/26/2013   CL 94* 05/26/2013   CREATININE 0.95 05/26/2013   BUN 14 05/26/2013   CO2 26 05/26/2013   TSH 9.996* 05/15/2013   INR 1.48 05/24/2013   HGBA1C 5.1 05/17/2013   Drips off , bp good now   Grace Isaac MD  Beeper [phone] Office [phone] 05/26/2013 7:25 PM

## 2013-05-26 NOTE — Progress Notes (Signed)
  Amiodarone Drug - Drug Interaction Consult Note  Recommendations: Meds reviewed. No recommendations at this time. Pharmacy will continue to monitor.  Amiodarone is metabolized by the cytochrome P450 system and therefore has the potential to cause many drug interactions. Amiodarone has an average plasma half-life of 50 days (range 20 to 100 days).   There is potential for drug interactions to occur several weeks or months after stopping treatment and the onset of drug interactions may be slow after initiating amiodarone.   []  Statins: Increased risk of myopathy. Simvastatin- restrict dose to 20mg  daily. Other statins: counsel patients to report any muscle pain or weakness immediately.  []  Anticoagulants: Amiodarone can increase anticoagulant effect. Consider warfarin dose reduction. Patients should be monitored closely and the dose of anticoagulant altered accordingly, remembering that amiodarone levels take several weeks to stabilize.  []  Antiepileptics: Amiodarone can increase plasma concentration of phenytoin, the dose should be reduced. Note that small changes in phenytoin dose can result in large changes in levels. Monitor patient and counsel on signs of toxicity.  [x]  Beta blockers: increased risk of bradycardia, AV block and myocardial depression. Sotalol - avoid concomitant use. On metoprolol  []   Calcium channel blockers (diltiazem and verapamil): increased risk of bradycardia, AV block and myocardial depression.  []   Cyclosporine: Amiodarone increases levels of cyclosporine. Reduced dose of cyclosporine is recommended.  []  Digoxin dose should be halved when amiodarone is started.  [x]  Diuretics: increased risk of cardiotoxicity if hypokalemia occurs.On lasix  []  Oral hypoglycemic agents (glyburide, glipizide, glimepiride): increased risk of hypoglycemia. Patient's glucose levels should be monitored closely when initiating amiodarone therapy.   []  Drugs that prolong the QT  interval:  Torsades de pointes risk may be increased with concurrent use - avoid if possible.  Monitor QTc, also keep magnesium/potassium WNL if concurrent therapy can't be avoided. Marland Kitchen Antibiotics: e.g. fluoroquinolones, erythromycin. . Antiarrhythmics: e.g. quinidine, procainamide, disopyramide, sotalol. . Antipsychotics: e.g. phenothiazines, haloperidol.  . Lithium, tricyclic antidepressants, and methadone. Thank You,  Deboraha Sprang  05/26/2013 10:15 AM

## 2013-05-26 NOTE — Progress Notes (Signed)
Ambulated approx. 100 feet in hallway x 2 max assist.  Patient very unsteady on feet and complains of chronic back and leg pain. PT eval ordered per Dr. Servando Snare.  Safety maintained. No acute distress noted.

## 2013-05-27 ENCOUNTER — Encounter (HOSPITAL_COMMUNITY): Payer: Self-pay | Admitting: Cardiothoracic Surgery

## 2013-05-27 LAB — TYPE AND SCREEN
ABO/RH(D): O POS
Antibody Screen: NEGATIVE
Unit division: 0
Unit division: 0
Unit division: 0
Unit division: 0
Unit division: 0
Unit division: 0

## 2013-05-27 LAB — GLUCOSE, CAPILLARY
Glucose-Capillary: 91 mg/dL (ref 70–99)
Glucose-Capillary: 106 mg/dL — ABNORMAL HIGH (ref 70–99)
Glucose-Capillary: 97 mg/dL (ref 70–99)
Glucose-Capillary: 125 mg/dL — ABNORMAL HIGH (ref 70–99)
Glucose-Capillary: 120 mg/dL — ABNORMAL HIGH (ref 70–99)

## 2013-05-27 LAB — BASIC METABOLIC PANEL
BUN: 20 mg/dL (ref 6–23)
CO2: 25 mEq/L (ref 19–32)
Calcium: 9.1 mg/dL (ref 8.4–10.5)
Chloride: 95 mEq/L — ABNORMAL LOW (ref 96–112)
Creatinine, Ser: 1.1 mg/dL (ref 0.50–1.10)
GFR calc Af Amer: 60 mL/min — ABNORMAL LOW (ref >90)
GFR calc non Af Amer: 52 mL/min — ABNORMAL LOW (ref >90)
Glucose, Bld: 111 mg/dL — ABNORMAL HIGH (ref 70–99)
Potassium: 4.4 mEq/L (ref 3.5–5.1)
Sodium: 130 mEq/L — ABNORMAL LOW (ref 135–145)

## 2013-05-27 LAB — PROTIME-INR
INR: 1.16 (ref 0.00–1.49)
Prothrombin Time: 14.6 seconds (ref 11.6–15.2)

## 2013-05-27 LAB — CBC
HCT: 26.2 % — ABNORMAL LOW (ref 36.0–46.0)
Hemoglobin: 9 g/dL — ABNORMAL LOW (ref 12.0–15.0)
MCH: 32 pg (ref 26.0–34.0)
MCHC: 34.4 g/dL (ref 30.0–36.0)
MCV: 93.2 fL (ref 78.0–100.0)
Platelets: 98 10*3/uL — ABNORMAL LOW (ref 150–400)
RBC: 2.81 MIL/uL — ABNORMAL LOW (ref 3.87–5.11)
RDW: 16 % — ABNORMAL HIGH (ref 11.5–15.5)
WBC: 8.9 10*3/uL (ref 4.0–10.5)

## 2013-05-27 MED ORDER — FUROSEMIDE 10 MG/ML IJ SOLN
40.0000 mg | Freq: Once | INTRAMUSCULAR | Status: AC
  Administered 2013-05-27: 40 mg via INTRAVENOUS
  Filled 2013-05-27: qty 4

## 2013-05-27 MED ORDER — AMIODARONE HCL IN DEXTROSE 360-4.14 MG/200ML-% IV SOLN
INTRAVENOUS | Status: AC
  Administered 2013-05-27: 30 mg/h via INTRAVENOUS
  Filled 2013-05-27: qty 200

## 2013-05-27 MED ORDER — MOVING RIGHT ALONG BOOK
Freq: Once | Status: AC
  Administered 2013-05-29: 16:00:00
  Filled 2013-05-27: qty 1

## 2013-05-27 MED ORDER — POTASSIUM CHLORIDE CRYS ER 20 MEQ PO TBCR
20.0000 meq | EXTENDED_RELEASE_TABLET | Freq: Every day | ORAL | Status: DC
  Administered 2013-05-28: 20 meq via ORAL
  Filled 2013-05-27 (×3): qty 1

## 2013-05-27 MED ORDER — WARFARIN - PHYSICIAN DOSING INPATIENT
Freq: Every day | Status: DC
  Administered 2013-05-28 – 2013-05-31 (×4)

## 2013-05-27 MED ORDER — TRAMADOL HCL 50 MG PO TABS
50.0000 mg | ORAL_TABLET | Freq: Four times a day (QID) | ORAL | Status: DC | PRN
  Filled 2013-05-27: qty 1

## 2013-05-27 MED ORDER — AMIODARONE HCL IN DEXTROSE 360-4.14 MG/200ML-% IV SOLN
30.0000 mg/h | INTRAVENOUS | Status: DC
  Filled 2013-05-27 (×3): qty 200

## 2013-05-27 MED ORDER — FE FUMARATE-B12-VIT C-FA-IFC PO CAPS
1.0000 | ORAL_CAPSULE | Freq: Three times a day (TID) | ORAL | Status: DC
  Administered 2013-05-27 – 2013-05-29 (×6): 1 via ORAL
  Filled 2013-05-27 (×11): qty 1

## 2013-05-27 MED ORDER — GUAIFENESIN ER 600 MG PO TB12
1200.0000 mg | ORAL_TABLET | Freq: Two times a day (BID) | ORAL | Status: DC
  Administered 2013-05-27 – 2013-05-31 (×9): 1200 mg via ORAL
  Filled 2013-05-27 (×12): qty 2

## 2013-05-27 MED ORDER — SODIUM CHLORIDE 0.9 % IV SOLN: 250.0000 mL | INTRAVENOUS | Status: DC | PRN

## 2013-05-27 MED ORDER — AMIODARONE HCL 200 MG PO TABS
400.0000 mg | ORAL_TABLET | Freq: Two times a day (BID) | ORAL | Status: DC
  Administered 2013-05-27 – 2013-05-29 (×5): 400 mg via ORAL
  Filled 2013-05-27 (×7): qty 2

## 2013-05-27 MED ORDER — METOPROLOL TARTRATE 25 MG PO TABS
25.0000 mg | ORAL_TABLET | Freq: Two times a day (BID) | ORAL | Status: DC
  Administered 2013-05-27 – 2013-05-31 (×8): 25 mg via ORAL
  Filled 2013-05-27 (×9): qty 1

## 2013-05-27 MED ORDER — SODIUM CHLORIDE 0.9 % IJ SOLN
3.0000 mL | Freq: Two times a day (BID) | INTRAMUSCULAR | Status: DC
  Administered 2013-05-28 – 2013-05-31 (×6): 3 mL via INTRAVENOUS

## 2013-05-27 MED ORDER — FUROSEMIDE 40 MG PO TABS
40.0000 mg | ORAL_TABLET | Freq: Two times a day (BID) | ORAL | Status: DC
  Administered 2013-05-27 – 2013-05-29 (×6): 40 mg via ORAL
  Filled 2013-05-27 (×9): qty 1

## 2013-05-27 MED ORDER — SODIUM CHLORIDE 0.9 % IJ SOLN: 3.0000 mL | INTRAMUSCULAR | Status: DC | PRN

## 2013-05-27 MED ORDER — WARFARIN SODIUM 2.5 MG PO TABS
2.5000 mg | ORAL_TABLET | Freq: Every day | ORAL | Status: DC
  Administered 2013-05-27: 2.5 mg via ORAL
  Filled 2013-05-27 (×2): qty 1

## 2013-05-27 MED ORDER — POTASSIUM CHLORIDE CRYS ER 20 MEQ PO TBCR
20.0000 meq | EXTENDED_RELEASE_TABLET | Freq: Two times a day (BID) | ORAL | Status: DC
  Administered 2013-05-27: 20 meq via ORAL
  Filled 2013-05-27: qty 1

## 2013-05-27 MED FILL — Heparin Sodium (Porcine) Inj 1000 Unit/ML: INTRAMUSCULAR | Qty: 10 | Status: AC

## 2013-05-27 MED FILL — Sodium Chloride IV Soln 0.9%: INTRAVENOUS | Qty: 1000 | Status: AC

## 2013-05-27 MED FILL — Lidocaine HCl IV Inj 20 MG/ML: INTRAVENOUS | Qty: 5 | Status: AC

## 2013-05-27 MED FILL — Potassium Chloride Inj 2 mEq/ML: INTRAVENOUS | Qty: 40 | Status: AC

## 2013-05-27 MED FILL — Magnesium Sulfate Inj 50%: INTRAMUSCULAR | Qty: 10 | Status: AC

## 2013-05-27 MED FILL — Electrolyte-R (PH 7.4) Solution: INTRAVENOUS | Qty: 4000 | Status: AC

## 2013-05-27 MED FILL — Sodium Bicarbonate IV Soln 8.4%: INTRAVENOUS | Qty: 50 | Status: AC

## 2013-05-27 MED FILL — Sodium Chloride Irrigation Soln 0.9%: Qty: 3000 | Status: AC

## 2013-05-27 MED FILL — Heparin Sodium (Porcine) Inj 1000 Unit/ML: INTRAMUSCULAR | Qty: 30 | Status: AC

## 2013-05-27 MED FILL — Mannitol IV Soln 20%: INTRAVENOUS | Qty: 500 | Status: AC

## 2013-05-27 NOTE — Progress Notes (Addendum)
      [address]       [place] [address]             [phone]      3 Days Post-Op Procedure(s) (LRB): MITRAL VALVE REPAIR (MVR) (N/A) INTRAOPERATIVE TRANSESOPHAGEAL ECHOCARDIOGRAM (N/A)  Subjective:  [name] states she is feeling better this morning.  She is ambulating some.  No BM + Flatus  Objective: Vital signs in last 24 hours: Temp:  [97.7 F (36.5 C)-98.8 F (37.1 C)] 98.2 F (36.8 C) (08/11 0400) Pulse Rate:  [59-128] 65 (08/11 0700) Cardiac Rhythm:  [-] Heart block (08/11 0500) Resp:  [16-25] 25 (08/11 0700) BP: (90-129)/(38-90) 122/65 mmHg (08/11 0700) SpO2:  [96 %-100 %] 100 % (08/11 0700) Weight:  [170 lb 6.7 oz (77.3 kg)] 170 lb 6.7 oz (77.3 kg) (08/11 0451)  Intake/Output from previous day: 08/10 0701 - 08/11 0700 In: 1921.8 [P.O.:1320; I.V.:551.8; IV Piggyback:50] Out: 710 [Urine:710]  General appearance: alert, cooperative and no distress Heart: regular rate and rhythm Lungs: diminished breath sounds bibasilar Abdomen: soft, non-tender; bowel sounds normal; no masses,  no organomegaly Extremities: edema trace Wound: clean and dry  Lab Results:  Recent Labs  05/26/13 0400 05/27/13 0330  WBC 10.4 8.9  HGB 9.5* 9.0*  HCT 27.8* 26.2*  PLT 102* 98*   BMET:  Recent Labs  05/26/13 0400 05/27/13 0330  NA 129* 130*  K 4.5 4.4  CL 94* 95*  CO2 26 25  GLUCOSE 99 111*  BUN 14 20  CREATININE 0.95 1.10  CALCIUM 9.1 9.1    PT/INR:  Recent Labs  05/27/13 0330  LABPROT 14.6  INR 1.16   ABG    Component Value Date/Time   PHART 7.318* 05/24/2013 2208   HCO3 23.3 05/24/2013 2208   TCO2 23 05/25/2013 1622   ACIDBASEDEF 3.0* 05/24/2013 2208   O2SAT 93.0 05/24/2013 2208   CBG (last 3)   Recent Labs  05/26/13 2007 05/26/13 2327 05/27/13 0358  GLUCAP 109* 91 116*    Assessment/Plan: S/P Procedure(s) (LRB): MITRAL VALVE REPAIR (MVR) (N/A) INTRAOPERATIVE TRANSESOPHAGEAL ECHOCARDIOGRAM (N/A)  1. CV- Sinus Loletha Grayer,  previous Atrial Fibrillation- pacer on back up on 60, on Lopressor at 12.5 mg bid, Amiodarone 200 BID 2. Pulm- on oxygen, productive cough with yellow sputum, afebrile, aggressive IS- no leukocytosis, will need to watch for development of pneumonia 3. Renal- creatinine at baseline, weight is elevated will start home dose Lasix 4. CBGs controlled, patient not a diabetic will d/c glucose checks 5. Dispo- PT consult pending, continue current care   LOS: 13 days    BARRETT, ERIN 05/27/2013  Needs diuresis and ambulation Will start coumadin if afib recurrent- was NSR preop and she would be high risk for coumadin Tx to 2000  circle

## 2013-05-27 NOTE — Progress Notes (Signed)
T. CTS p.m. Rounds  Postop day 3 after mitral valve repair Patient was to move to step down today but had recurrent rapid atrial fibrillation Patient is been started on oral Coumadin and amiodarone drip has been resumed Keep in ICU because of rapid atrial fib Beta blocker increased to 25 Lopressor twice a day

## 2013-05-27 NOTE — Op Note (Signed)
NAMEARIANIS, DUBINSKI                ACCOUNT NO.:  [id number]  MEDICAL RECORD NO.:  [id number]  LOCATION:                               FACILITY:  [facility]  PHYSICIAN:  Leda Quail, M.D.        DATE OF BIRTH:  1946/07/05  DATE OF PROCEDURE:  05/24/2013 DATE OF DISCHARGE:  05/24/2013                              OPERATIVE REPORT   Ms. Bloxham is a 67 year old female with known severe mitral regurgitation, who is scheduled at this time for mitral valve repair or replacement under general anesthesia.  The TEE will be used intraoperatively to assess the success of repair or the necessity for replacement.  The patient has no history of esophageal or gastric pathology.  After induction of general anesthesia, the airway was secured with an oral endotracheal tube.  An oral gastric tube was then placed to remove gastric contents.  This was then removed and replaced with the transesophageal transducer.  After it was heavily lubricated, there was no resistance in placing the transducer.  It remained in the neutral and flexed position during the bypass.  At the completion of the procedure, there was no evidence of oral or pharyngeal damage probe did not exceed 40-45 cm insertion.  The prebypass examination revealed the left ventricle to be slightly dilated.  There were no segmental defects.  There was good contractility.  Left atrium was enlarged.  Interatrial septum was intact.  The appendage was clean.  The mitral valve had a flail P2 segment of posterior leaflet.  There was severe mitral regurgitant yet on color Doppler that directed over the anterior leaflet extending all the way to the posterior wall.  Flow reversal was documented in the right pulmonary vein.  Regurgitant velocity maximum was 520 cm/second. There was no significant gradient measured at 5 mmHg.  The aortic valve had 3 leaflets, all opening and closing appropriately.  No calcification was detected.  There was a wisp of aortic  insufficiency, central and location on color Doppler.  The aorta was normal size.  Route was measured at 33 mm in diameter.  The descending aorta was clean, right atrium was normal size.  Right ventricle also was normal size, and showed good contractility.  The tricuspid valve had a normal appearance with the Swan-Ganz catheter across the valve.  Color Doppler did revealed a mild-to-moderate tricuspid regurgitation.  The patient was placed on cardiopulmonary bypass and underwent a prosthetic ring placement and posterior wedge resection of the flail segment after a completion of the bypass period.  A few air bubbles were cleared with a left ventricular vent.  Post bypass examination now revealed the left ventricle to show a significantly reduced chamber size.  Additional fluids were required to increase the left ventricular size to prebypass level contractility, however, remained good throughout.  There were no evidence of segmental defects.  No change in the left atrial appearance.  The mitral valve especially on 3D showed reduced size of the posterior leaflet after the wedge resection.  The ring appeared to be well seated.  There was good coaptation of the anterior and posterior leaflets.  Color Doppler revealed only a trace of central regurgitant  flow.  Gradient was measured about 4 mmHg across the valve.  The aortic valve was unchanged in appearance and function, continuing to show just a wisp of central regurgitant flow.  Right heart examination was also unchanged from the prebypass examination with the right ventricle also showing good contractility.  There were no other new findings in the post bypass examination.          ______________________________ Leda Quail, M.D.     LK/MEDQ  D:  05/24/2013  T:  05/25/2013  Job:  [id number]

## 2013-05-27 NOTE — Addendum Note (Signed)
Addendum created 05/27/13 1046 by Sydnee Levans, MD   Modules edited: Anesthesia Events, Anesthesia LDA

## 2013-05-27 NOTE — Evaluation (Signed)
Physical Therapy Evaluation Patient Details Name: [name] MRN: [id number] DOB: 10/28/1945 Today's Date: 05/27/2013 Time: [id number] PT Time Calculation (min): 25 min  PT Assessment / Plan / Recommendation History of Present Illness  Pt is s/p Mitral valve rep.  Clinical Impression  Pt demonstrates deficits in functional mobility as indicated below. Pt will benefit from continued skilled PT to address deficits and maximize function. Patient lives alone and will need ST SNF upon discharge to ensure safety and mobility prior to returning home. Will see as indicated and progress activity as tolerated.    PT Assessment  Patient needs continued PT services    Follow Up Recommendations  SNF    Does the patient have the potential to tolerate intense rehabilitation      Barriers to Discharge Decreased caregiver support      Equipment Recommendations  Rolling walker with 5" wheels;3in1 (PT)    Recommendations for Other Services     Frequency Min 3X/week    Precautions / Restrictions Precautions Precautions: Sternal Restrictions Weight Bearing Restrictions:  (sternal precautions)   Pertinent Vitals/Pain 4/10, VSS, patient with increased fatigue during activity      Mobility  Bed Mobility Bed Mobility: Not assessed Transfers Transfers: Sit to Stand;Stand to Sit Sit to Stand: 3: Mod assist Stand to Sit: 4: Min guard Details for Transfer Assistance: VCs for technique, assist for elevation and stability Ambulation/Gait Ambulation/Gait Assistance: 4: Min assist Ambulation Distance (Feet): 80 Feet Assistive device: Rolling walker Ambulation/Gait Assistance Details: VCs for proper use of assistive device, assist for stability Gait Pattern: Step-through pattern;Decreased stride length;Shuffle;Trunk flexed;Narrow base of support Gait velocity: decreased General Gait Details: unsteady with ambulation, easily fatigued    Exercises     PT Diagnosis: Difficulty  walking;Generalized weakness  PT Problem List: Decreased strength;Decreased activity tolerance;Decreased balance;Decreased mobility;Decreased knowledge of use of DME PT Treatment Interventions: DME instruction;Gait training;Stair training;Functional mobility training;Therapeutic activities;Therapeutic exercise;Balance training;Patient/family education     PT Goals(Current goals can be found in the care plan section) Acute Rehab PT Goals Patient Stated Goal: to go home PT Goal Formulation: With patient Time For Goal Achievement: 06/10/13 Potential to Achieve Goals: Good  Visit Information  Last PT Received On: 05/27/13 Assistance Needed: +2 History of Present Illness: Pt is s/p Mitral valve rep.       Prior [place] expects to be discharged to:: Private residence Living Arrangements: Alone Available Help at Discharge: Family Type of Home: House Home Access: Stairs to enter Technical brewer of Steps: 2 Entrance Stairs-Rails: None Home Layout: One level Home Equipment: None Prior Function Level of Independence: Independent Communication Communication: HOH Dominant Hand: Right    Cognition  Cognition Arousal/Alertness: Awake/alert Behavior During Therapy: WFL for tasks assessed/performed Overall Cognitive Status: Within Functional Limits for tasks assessed    Extremity/Trunk Assessment Upper Extremity Assessment Upper Extremity Assessment: Generalized weakness Lower Extremity Assessment Lower Extremity Assessment: Generalized weakness   Balance Balance Balance Assessed: Yes Static Standing Balance Static Standing - Balance Support: Bilateral upper extremity supported Static Standing - Level of Assistance: 4: Min assist Static Standing - Comment/# of Minutes: increased fatigue High Level Balance High Level Balance Activites: Side stepping;Backward walking;Direction changes;Turns High Level Balance Comments: assist for stability VCs  for positioning with Rw  End of Session PT - End of Session Equipment Utilized During Treatment: Gait belt Activity Tolerance: Patient limited by fatigue Patient left: in chair;with call bell/phone within reach Nurse Communication: Mobility status  GP     Barb Merino, Marcelino Scot  J 05/27/2013, 1:27 PM  Alben Deeds, [place] DPT  [phone]

## 2013-05-27 NOTE — Progress Notes (Signed)
Pt in A-Fib with rate 110s-150 with BPs 88-100/50-60. Dr. Nils Pyle paged and, per MD, Amio 30 mg/hr started without bolus, will continue Amio 200 mg PO BID, and cancel transfer to 2000. Will continue to monitor.

## 2013-05-28 ENCOUNTER — Inpatient Hospital Stay (HOSPITAL_COMMUNITY): Payer: Medicare Other

## 2013-05-28 LAB — POCT I-STAT 3, ART BLOOD GAS (G3+)
Bicarbonate: 20.6 mEq/L (ref 20.0–24.0)
O2 Saturation: 96 %
TCO2: 22 mmol/L (ref 0–100)
pCO2 arterial: 42.5 mmHg (ref 35.0–45.0)
pH, Arterial: 7.294 — ABNORMAL LOW (ref 7.350–7.450)
pO2, Arterial: 89 mmHg (ref 80.0–100.0)

## 2013-05-28 LAB — CBC
HCT: 27.1 % — ABNORMAL LOW (ref 36.0–46.0)
Hemoglobin: 9.4 g/dL — ABNORMAL LOW (ref 12.0–15.0)
MCH: 32.1 pg (ref 26.0–34.0)
MCHC: 34.7 g/dL (ref 30.0–36.0)
MCV: 92.5 fL (ref 78.0–100.0)
Platelets: 153 10*3/uL (ref 150–400)
RBC: 2.93 MIL/uL — ABNORMAL LOW (ref 3.87–5.11)
RDW: 15.6 % — ABNORMAL HIGH (ref 11.5–15.5)
WBC: 7.7 10*3/uL (ref 4.0–10.5)

## 2013-05-28 LAB — BASIC METABOLIC PANEL
BUN: 22 mg/dL (ref 6–23)
CO2: 25 mEq/L (ref 19–32)
Calcium: 8.9 mg/dL (ref 8.4–10.5)
Chloride: 93 mEq/L — ABNORMAL LOW (ref 96–112)
Creatinine, Ser: 0.98 mg/dL (ref 0.50–1.10)
GFR calc Af Amer: 69 mL/min — ABNORMAL LOW (ref >90)
GFR calc non Af Amer: 59 mL/min — ABNORMAL LOW (ref >90)
Glucose, Bld: 109 mg/dL — ABNORMAL HIGH (ref 70–99)
Potassium: 3.9 mEq/L (ref 3.5–5.1)
Sodium: 131 mEq/L — ABNORMAL LOW (ref 135–145)

## 2013-05-28 LAB — PROTIME-INR
INR: 1.19 (ref 0.00–1.49)
Prothrombin Time: 14.8 seconds (ref 11.6–15.2)

## 2013-05-28 MED ORDER — MAGNESIUM HYDROXIDE 400 MG/5ML PO SUSP
15.0000 mL | Freq: Every evening | ORAL | Status: DC | PRN
  Administered 2013-05-28: 15 mL via ORAL
  Filled 2013-05-28: qty 30

## 2013-05-28 MED ORDER — WARFARIN SODIUM 3 MG PO TABS
3.0000 mg | ORAL_TABLET | Freq: Every day | ORAL | Status: DC
  Administered 2013-05-28 – 2013-05-31 (×4): 3 mg via ORAL
  Filled 2013-05-28 (×4): qty 1

## 2013-05-28 MED ORDER — ENSURE COMPLETE PO LIQD
237.0000 mL | ORAL | Status: DC
  Administered 2013-05-28 – 2013-05-29 (×2): 237 mL via ORAL

## 2013-05-28 MED ORDER — DILTIAZEM LOAD VIA INFUSION
10.0000 mg | Freq: Once | INTRAVENOUS | Status: AC
  Administered 2013-05-28: 10 mg via INTRAVENOUS
  Filled 2013-05-28: qty 10

## 2013-05-28 MED ORDER — DILTIAZEM HCL 100 MG IV SOLR
5.0000 mg/h | INTRAVENOUS | Status: DC
  Administered 2013-05-28: 15 mg/h via INTRAVENOUS
  Administered 2013-05-28: 5 mg/h via INTRAVENOUS
  Filled 2013-05-28: qty 100

## 2013-05-28 NOTE — Progress Notes (Signed)
POD # 4 MVR  Up in chair  BP 114/92  Pulse 114  Temp(Src) 98.4 F (36.9 C) (Oral)  Resp 28  Ht 5\' 4"  (1.626 m)  Wt 166 lb 14.2 oz (75.7 kg)  BMI 28.63 kg/m2  SpO2 96%   Intake/Output Summary (Last 24 hours) at 05/28/13 1722 Last data filed at 05/28/13 1600  Gross per 24 hour  Intake  788.9 ml  Output   2700 ml  Net -1911.1 ml    Remains in a fib with rate in 110-120 range  On amiodarone and diltiazem gtt

## 2013-05-28 NOTE — Progress Notes (Signed)
NUTRITION FOLLOW UP  Intervention:   Resume Ensure Complete PO daily.  Consider more aggressive bowel regimen, pt has not had a BM x 1 week per document flowsheets.  No additional interventions at this time.  Nutrition Dx:   Inadequate oral intake now related to nausea as evidenced by reported decrease in appetite. Improving.  Goal:   Pt to meet >/= 90% of their estimated nutrition needs. Variable.  Monitor:   weight trends, lab trends, I/O's, PO intake, supplement tolerance  Assessment:   Pt admitted with c/o generalized weakness. She underwent a CT angiogram of chest PO protocol 7/30. Pt was diuresed. SP day 3 catheterization. Scheduled for mitral valve repair-replacement 8/7.   Underwent alveoloplasty and gross debridement of remaining dentition on 8/5.  Underwent mitral valve repair on 8/8.  Currently ordered for Dysphagia 3 diet. She reports that her oral intake is improving and denies oral pain s/p dental procedures. Pt was drinking Ensure Complete prior to surgery and would like to resume at this time.   Height: Ht Readings from Last 1 Encounters:  05/15/13 5\' 4"  (1.626 m)    Weight Status:   Wt Readings from Last 1 Encounters:  05/28/13 166 lb 14.2 oz (75.7 kg)  Stable  Re-estimated needs:  Kcal:  1700 - 1900 Protein: 90 - 100 g Fluid: 1.7 - 1.9 liters  Skin:  lip incision Chest incision  Diet Order: Dysphagia 3; thin   Intake/Output Summary (Last 24 hours) at 05/28/13 1459 Last data filed at 05/28/13 1400  Gross per 24 hour  Intake  758.9 ml  Output   2700 ml  Net -1941.1 ml    Last BM: 8/5   Labs:   Recent Labs Lab 05/24/13 1930  05/25/13 0400 05/25/13 1622 05/26/13 0400 05/27/13 0330 05/28/13 0410  NA  --   < > 134* 133* 129* 130* 131*  K  --   < > 4.1 4.4 4.5 4.4 3.9  CL  --   < > 101 98 94* 95* 93*  CO2  --   --  24  --  26 25 25   BUN  --   < > 15 12 14 20 22   CREATININE 1.09  < > 1.02 1.12*  1.20* 0.95 1.10 0.98  CALCIUM  --    --  8.9  --  9.1 9.1 8.9  MG 3.5*  --  2.6* 2.3  --   --   --   GLUCOSE  --   < > 98 125* 99 111* 109*  < > = values in this interval not displayed.  CBG (last 3)   Recent Labs  05/27/13 1612 05/27/13 1945 05/27/13 2342  GLUCAP 125* 120* 118*    Scheduled Meds: . acetaminophen  1,000 mg Oral Q6H   Or  . acetaminophen (TYLENOL) oral liquid 160 mg/5 mL  1,000 mg Per Tube Q6H  . amiodarone  400 mg Oral Q12H  . aspirin EC  325 mg Oral Daily  . bisacodyl  10 mg Oral Daily   Or  . bisacodyl  10 mg Rectal Daily  . docusate sodium  200 mg Oral Daily  . enoxaparin  30 mg Subcutaneous Q24H  . ferrous Q000111Q C-folic acid  1 capsule Oral TID PC  . furosemide  40 mg Oral BID  . guaiFENesin  1,200 mg Oral BID  . hydrOXYzine  10 mg Oral QHS  . levothyroxine  100 mcg Oral QAC breakfast  . metoprolol tartrate  25 mg Oral  BID  . moving right along book   Does not apply Once  . PARoxetine  60 mg Oral Daily  . potassium chloride  20 mEq Oral Daily  . sodium chloride  3 mL Intravenous Q12H  . warfarin  3 mg Oral q1800  . Warfarin - Physician Dosing Inpatient   Does not apply q1800    Continuous Infusions: . diltiazem (CARDIZEM) infusion 5 mg/hr (05/28/13 1300)    Inda Coke MS, RD, LDN Pager: [phone] After-hours pager: [phone]

## 2013-05-28 NOTE — Progress Notes (Signed)
Spoke with Dr. Roxan Hockey regarding HR sustaining in 130s-140s. Cardizem drip order in for a rate of 5-10mg /hr. BP stable at 110/76. Order received for 10mg  bolus and may increase drip up to 20mg /hr; titrate down when heart rate under control. Richarda Blade RN

## 2013-05-28 NOTE — Progress Notes (Signed)
4 Days Post-Op Procedure(s) (LRB): MITRAL VALVE REPAIR (MVR) (N/A) INTRAOPERATIVE TRANSESOPHAGEAL ECHOCARDIOGRAM (N/A) Subjective: POD #4 MV repair Persistent and recurrent  a-fib on amio Will start cardizem drip for chemical cardioversion CXR clear walking with PT  Objective: Vital signs in last 24 hours: Temp:  [98.3 F (36.8 C)-100.1 F (37.8 C)] 98.4 F (36.9 C) (08/12 0750) Pulse Rate:  [44-135] 116 (08/12 0900) Cardiac Rhythm:  [-] Atrial fibrillation (08/12 0800) Resp:  [17-28] 20 (08/12 0900) BP: (85-132)/(44-100) 126/55 mmHg (08/12 0900) SpO2:  [90 %-100 %] 99 % (08/12 0900) Weight:  [166 lb 14.2 oz (75.7 kg)] 166 lb 14.2 oz (75.7 kg) (08/12 0500)  Hemodynamic parameters for last 24 hours:  stable  Intake/Output from previous day: 08/11 0701 - 08/12 0700 In: 790.5 [P.O.:360; I.V.:430.5] Out: 1850 [Urine:1850] Intake/Output this shift: Total I/O In: 293.4 [P.O.:240; I.V.:53.4] Out: -   EXAM Lungs clear No murmur Incision clean and dry  Lab Results:  Recent Labs  05/27/13 0330 05/28/13 0410  WBC 8.9 7.7  HGB 9.0* 9.4*  HCT 26.2* 27.1*  PLT 98* 153   BMET:  Recent Labs  05/27/13 0330 05/28/13 0410  NA 130* 131*  K 4.4 3.9  CL 95* 93*  CO2 25 25  GLUCOSE 111* 109*  BUN 20 22  CREATININE 1.10 0.98  CALCIUM 9.1 8.9    PT/INR:  Recent Labs  05/28/13 0410  LABPROT 14.8  INR 1.19   ABG    Component Value Date/Time   PHART 7.318* 05/24/2013 2208   HCO3 23.3 05/24/2013 2208   TCO2 23 05/25/2013 1622   ACIDBASEDEF 3.0* 05/24/2013 2208   O2SAT 93.0 05/24/2013 2208   CBG (last 3)   Recent Labs  05/27/13 1612 05/27/13 1945 05/27/13 2342  GLUCAP 125* 120* 118*    Assessment/Plan: S/P Procedure(s) (LRB): MITRAL VALVE REPAIR (MVR) (N/A) INTRAOPERATIVE TRANSESOPHAGEAL ECHOCARDIOGRAM (N/A) Keep in ICU because of recurerent afib Cont with coumadin load   LOS: 14 days    [name],Aviance [place] 05/28/2013

## 2013-05-28 NOTE — Care Management Note (Addendum)
    Page 1 of 2   05/31/2013     3:47:58 PM   CARE MANAGEMENT NOTE 05/31/2013  Patient:  [name], [name]   Account Number:  [id number]  Date Initiated:  05/15/2013  Documentation initiated by:  Llana Aliment  Subjective/Objective Assessment:   67 y.o. WF PMHx CHF, mitral regurg, cardiac arrhythmia, atrial fibrillation, HTN, HLD, hypothyroidism, noted dyspnea with exertion when she walks.  Pt. lives alone, however, her son lives down the road from her.     Action/Plan:   discharge planning   Anticipated DC Date:  05/31/2013   Anticipated DC Plan:  [place] referral  Clinical Social Worker      DC Planning Services  CM consult      Choice offered to / List presented to:  C-1 Patient        [place] arranged  HH-1 RN  [place] PT      [place].   Status of service:  Completed, signed off Medicare Important Message given?   (If response is "NO", the following Medicare IM given date fields will be blank) Date Medicare IM given:   Date Additional Medicare IM given:    Discharge Disposition:  [place]  Per UR Regulation:  Reviewed for med. necessity/level of care/duration of stay  If discussed at [place] of Stay Meetings, dates discussed:   05/23/2013    Comments:  05/31/13 Quintasha Gren,RN,BSN [id number] PT [place] [facility] [place]; NOTIFIED [place].  START OF CARE 24-48H POST DC DATE.  PLAN DC TO [facility] HOME WITH [facility] CARE AT [facility]  05-28-13 11:30am Luz Lex, RNBSN [phone] Post op MVR on 05-24-13 - Sitting up in chair - persistent Afib - on amio - starting cardizem.  Lives at home alone prior to but states her son will live with her or she will live with him - not sure which - but will have 24/7 assistance.  05/16/13.Marland KitchenMarland KitchenFuller Mandril, RN, BSN, [employer] [phone] Spoke with pt at beside regarding discharge planning for disease mangement of CHF.  Offered pt list of Home Health agencies.   Pt chose [facility] to render services. Janae Sauce of [facility] notified.  No DME needs indentified at this time.  05/15/13 1100 In to complete heart failure home health screen.  Pt. lives by herself, however her son helps her if needed.  She is able to ambulate without assistive devices and can take care of herself.  She does not want home health at this time.  She states she is able to afford her medications, and she stated she had all of her medications, except for a few that she had not refilled.  She is able to go to her physician appointments without difficulty. Llana Aliment, RN, BSN NCM [phone]

## 2013-05-29 LAB — BASIC METABOLIC PANEL
BUN: 18 mg/dL (ref 6–23)
CO2: 27 mEq/L (ref 19–32)
Calcium: 8.5 mg/dL (ref 8.4–10.5)
Chloride: 94 mEq/L — ABNORMAL LOW (ref 96–112)
Creatinine, Ser: 0.81 mg/dL (ref 0.50–1.10)
GFR calc Af Amer: 86 mL/min — ABNORMAL LOW (ref >90)
GFR calc non Af Amer: 75 mL/min — ABNORMAL LOW (ref >90)
Glucose, Bld: 117 mg/dL — ABNORMAL HIGH (ref 70–99)
Potassium: 4.6 mEq/L (ref 3.5–5.1)
Sodium: 132 mEq/L — ABNORMAL LOW (ref 135–145)

## 2013-05-29 LAB — CBC
HCT: 28.7 % — ABNORMAL LOW (ref 36.0–46.0)
Hemoglobin: 9.9 g/dL — ABNORMAL LOW (ref 12.0–15.0)
MCH: 31.9 pg (ref 26.0–34.0)
MCHC: 34.5 g/dL (ref 30.0–36.0)
MCV: 92.6 fL (ref 78.0–100.0)
Platelets: 213 10*3/uL (ref 150–400)
RBC: 3.1 MIL/uL — ABNORMAL LOW (ref 3.87–5.11)
RDW: 15.6 % — ABNORMAL HIGH (ref 11.5–15.5)
WBC: 8.2 10*3/uL (ref 4.0–10.5)

## 2013-05-29 LAB — PROTIME-INR
INR: 1.05 (ref 0.00–1.49)
Prothrombin Time: 13.5 seconds (ref 11.6–15.2)

## 2013-05-29 NOTE — Progress Notes (Signed)
Pt transferred to [place] bed.  Pt was transferred via a wheelchair.  All VS were WNL upon transport.  Report was called to Mordecai Rasmussen and the receiving nurse met [place] in the room upon arrival.  No arrythmias were noted throughout the shift.

## 2013-05-29 NOTE — Progress Notes (Signed)
Found a metal pacing tip in bed with patient. Wires were taped and protected 8/12. Pacing tip from atrial wires. One atrial pacing tip still in place. Wire for missing tip still in place. Re-taped wires.  Richarda Blade RN

## 2013-05-29 NOTE — Progress Notes (Signed)
5 Days Post-Op Procedure(s) (LRB): MITRAL VALVE REPAIR (MVR) (N/A) INTRAOPERATIVE TRANSESOPHAGEAL ECHOCARDIOGRAM (N/A) Subjective: Postop day 5 mitral valve repair Postoperative atrial fibrillation now in sinus on amiodarone and after 12 hours of IV Cardizem drip   Coumadin started INR still 1.1 Chest x-ray clear, incisions healing No murmur Patient walking in the hallway twice a day Ready for transfer to step down  O postop dabjective: Vital signs in last 24 hours: Temp:  [98.2 F (36.8 C)-98.9 F (37.2 C)] 98.2 F (36.8 C) (08/13 0745) Pulse Rate:  [58-134] 70 (08/13 0700) Cardiac Rhythm:  [-] Ventricular paced (08/13 0800) Resp:  [17-29] 26 (08/13 0700) BP: (92-127)/(46-98) 125/66 mmHg (08/13 0700) SpO2:  [93 %-100 %] 99 % (08/13 0700) Weight:  [166 lb 0.1 oz (75.3 kg)] 166 lb 0.1 oz (75.3 kg) (08/13 0651)  Hemodynamic parameters for last 24 hours:  Sinus rhythm afebrile   Intake/Output from previous day: 08/12 0701 - 08/13 0700 In: 885.4 [P.O.:480; I.V.:405.4] Out: 2675 [Urine:2675] Intake/Output this shift:    Exam   alert and comfortable Lungs clear No murmur  Lab Results:  Recent Labs  05/28/13 0410 05/29/13 0352  WBC 7.7 8.2  HGB 9.4* 9.9*  HCT 27.1* 28.7*  PLT 153 213   BMET:  Recent Labs  05/28/13 0410 05/29/13 0352  NA 131* 132*  K 3.9 4.6  CL 93* 94*  CO2 25 27  GLUCOSE 109* 117*  BUN 22 18  CREATININE 0.98 0.81  CALCIUM 8.9 8.5    PT/INR:  Recent Labs  05/29/13 0352  LABPROT 13.5  INR 1.05   ABG    Component Value Date/Time   PHART 7.318* 05/24/2013 2208   HCO3 23.3 05/24/2013 2208   TCO2 23 05/25/2013 1622   ACIDBASEDEF 3.0* 05/24/2013 2208   O2SAT 93.0 05/24/2013 2208   CBG (last 3)   Recent Labs  05/27/13 1612 05/27/13 1945 05/27/13 2342  GLUCAP 125* 120* 118*    Assessment/Plan: S/P Procedure(s) (LRB): MITRAL VALVE REPAIR (MVR) (N/A) INTRAOPERATIVE TRANSESOPHAGEAL ECHOCARDIOGRAM (N/A) Plan transfer to step down   continue  Coumadin  load Patient will need by mouth amiodarone and Coumadin at discharge, home health to draw INR with results sent to Dr. Terrence Dupont   LOS: 15 days    VAN Wilber Oliphant 05/29/2013

## 2013-05-29 NOTE — Progress Notes (Signed)
Physical Therapy Treatment Patient Details Name: [name] MRN: [id number] DOB: Oct 15, 1946 Today's Date: 05/29/2013 Time: [id number] PT Time Calculation (min): 24 min  PT Assessment / Plan / Recommendation  History of Present Illness Pt is s/p Mitral valve rep.   PT Comments   Patient demonstrates some improvements in functional mobility as indicated. Spoke to patient at length regarding sternal precautions and techniques for compliance.  Educated patient on positioning for transfers, pt still requiring increased assist. Patient able to tolerate increased ambulation today but still limited by arthritic pain in bilateral LEs. Will continue to see patient and progress activity as tolerated.   Follow Up Recommendations  SNF           Equipment Recommendations  Rolling walker with 5" wheels;3in1 (PT)    Recommendations for Other Services    Frequency Min 3X/week   Progress towards PT Goals Progress towards PT goals: Progressing toward goals  Plan Current plan remains appropriate    Precautions / Restrictions Precautions Precautions: Sternal Restrictions Weight Bearing Restrictions:  (sternal precautions)   Pertinent Vitals/Pain Patient reports 6/10 LE pain    Mobility  Bed Mobility Bed Mobility: Not assessed Transfers Transfers: Sit to Stand;Stand to Sit Sit to Stand: 3: Mod assist Stand to Sit: 4: Min guard Details for Transfer Assistance: VCs for technique, assist for elevation and stability Ambulation/Gait Ambulation/Gait Assistance: 4: Min assist Ambulation Distance (Feet): 160 Feet Assistive device: Rolling walker Ambulation/Gait Assistance Details: 2 standing rest breaks because of pain (arthritis) in LEs, assist for control of rw upon fatigue, VCs for upright posture Gait Pattern: Step-through pattern;Decreased stride length;Shuffle;Trunk flexed;Narrow base of support Gait velocity: decreased General Gait Details: unsteady with ambulation, easily fatigued     Exercises General Exercises - Lower Extremity Ankle Circles/Pumps: AROM;Both;10 reps Long Arc Quad: AROM;Both;10 reps     PT Goals (current goals can now be found in the care plan section) Acute Rehab PT Goals Patient Stated Goal: to go home PT Goal Formulation: With patient Time For Goal Achievement: 06/10/13 Potential to Achieve Goals: Good  Visit Information  Last PT Received On: 05/29/13 Assistance Needed: +2 History of Present Illness: Pt is s/p Mitral valve rep.    Subjective Data  Subjective: I have been feeling like im going to get sick Patient Stated Goal: to go home   Cognition  Cognition Arousal/Alertness: Awake/alert Behavior During Therapy: WFL for tasks assessed/performed Overall Cognitive Status: Within Functional Limits for tasks assessed    Balance  Balance Balance Assessed: Yes Static Standing Balance Static Standing - Balance Support: Bilateral upper extremity supported Static Standing - Level of Assistance: 4: Min assist High Level Balance High Level Balance Activites: Side stepping;Backward walking;Direction changes;Turns High Level Balance Comments: assist for stability VCs for positioning with Rw  End of Session PT - End of Session Equipment Utilized During Treatment: Gait belt Activity Tolerance: Patient limited by fatigue Patient left: in chair;with call bell/phone within reach Nurse Communication: Mobility status   GP     Duncan Dull 05/29/2013, 3:15 PM Alben Deeds, [place] DPT  [phone]

## 2013-05-29 NOTE — Progress Notes (Signed)
Patient converted to to sinus tach with a rate in the 110s around midnight. At 0215 heart rate dropped into low 50s. Patient asymptomatic. Placed patient on VVI at 44. Cardizem drip turned off. Richarda Blade RN

## 2013-05-30 ENCOUNTER — Inpatient Hospital Stay (HOSPITAL_COMMUNITY): Payer: Medicare Other

## 2013-05-30 DIAGNOSIS — Z954 Presence of other heart-valve replacement: Secondary | ICD-10-CM

## 2013-05-30 DIAGNOSIS — I059 Rheumatic mitral valve disease, unspecified: Secondary | ICD-10-CM

## 2013-05-30 LAB — CBC
HCT: 25.9 % — ABNORMAL LOW (ref 36.0–46.0)
Hemoglobin: 8.9 g/dL — ABNORMAL LOW (ref 12.0–15.0)
MCH: 32 pg (ref 26.0–34.0)
MCHC: 34.4 g/dL (ref 30.0–36.0)
MCV: 93.2 fL (ref 78.0–100.0)
Platelets: 243 10*3/uL (ref 150–400)
RBC: 2.78 MIL/uL — ABNORMAL LOW (ref 3.87–5.11)
RDW: 15.4 % (ref 11.5–15.5)
WBC: 7.2 10*3/uL (ref 4.0–10.5)

## 2013-05-30 LAB — BASIC METABOLIC PANEL
BUN: 14 mg/dL (ref 6–23)
CO2: 27 mEq/L (ref 19–32)
Calcium: 8.8 mg/dL (ref 8.4–10.5)
Chloride: 96 mEq/L (ref 96–112)
Creatinine, Ser: 0.84 mg/dL (ref 0.50–1.10)
GFR calc Af Amer: 82 mL/min — ABNORMAL LOW (ref >90)
GFR calc non Af Amer: 71 mL/min — ABNORMAL LOW (ref >90)
Glucose, Bld: 113 mg/dL — ABNORMAL HIGH (ref 70–99)
Potassium: 3.6 mEq/L (ref 3.5–5.1)
Sodium: 135 mEq/L (ref 135–145)

## 2013-05-30 LAB — PROTIME-INR
INR: 1.17 (ref 0.00–1.49)
Prothrombin Time: 14.7 seconds (ref 11.6–15.2)

## 2013-05-30 MED ORDER — ASPIRIN EC 81 MG PO TBEC
81.0000 mg | DELAYED_RELEASE_TABLET | Freq: Every day | ORAL | Status: DC
  Administered 2013-05-30 – 2013-05-31 (×2): 81 mg via ORAL
  Filled 2013-05-30 (×2): qty 1

## 2013-05-30 MED ORDER — POTASSIUM CHLORIDE CRYS ER 20 MEQ PO TBCR
20.0000 meq | EXTENDED_RELEASE_TABLET | Freq: Every day | ORAL | Status: DC
  Administered 2013-05-31: 20 meq via ORAL
  Filled 2013-05-30: qty 1

## 2013-05-30 MED ORDER — SODIUM CHLORIDE 0.9 % IR SOLN: 200.0000 mL | Status: DC

## 2013-05-30 MED ORDER — LISINOPRIL 5 MG PO TABS
5.0000 mg | ORAL_TABLET | Freq: Every day | ORAL | Status: DC
  Administered 2013-05-30 – 2013-05-31 (×2): 5 mg via ORAL
  Filled 2013-05-30 (×2): qty 1

## 2013-05-30 MED ORDER — FUROSEMIDE 40 MG PO TABS
40.0000 mg | ORAL_TABLET | Freq: Every day | ORAL | Status: DC
  Filled 2013-05-30: qty 1

## 2013-05-30 MED ORDER — AMIODARONE HCL 200 MG PO TABS
200.0000 mg | ORAL_TABLET | Freq: Two times a day (BID) | ORAL | Status: DC
  Administered 2013-05-30 – 2013-05-31 (×4): 200 mg via ORAL
  Filled 2013-05-30 (×4): qty 1

## 2013-05-30 MED ORDER — FUROSEMIDE 40 MG PO TABS
40.0000 mg | ORAL_TABLET | Freq: Every day | ORAL | Status: DC
  Administered 2013-05-31: 40 mg via ORAL
  Filled 2013-05-30: qty 1

## 2013-05-30 MED ORDER — CHLORHEXIDINE GLUCONATE 0.12 % MT SOLN
15.0000 mL | Freq: Two times a day (BID) | OROMUCOSAL | Status: DC
  Administered 2013-05-30 – 2013-05-31 (×3): 15 mL via OROMUCOSAL
  Filled 2013-05-30 (×4): qty 15

## 2013-05-30 MED ORDER — PATIENT'S GUIDE TO USING COUMADIN BOOK
Freq: Once | Status: AC
  Administered 2013-05-30: 18:00:00
  Filled 2013-05-30: qty 1

## 2013-05-30 MED ORDER — POTASSIUM CHLORIDE CRYS ER 20 MEQ PO TBCR
30.0000 meq | EXTENDED_RELEASE_TABLET | Freq: Once | ORAL | Status: AC
  Administered 2013-05-30: 30 meq via ORAL
  Filled 2013-05-30: qty 1

## 2013-05-30 MED ORDER — FUROSEMIDE 10 MG/ML IJ SOLN
40.0000 mg | Freq: Once | INTRAMUSCULAR | Status: AC
  Administered 2013-05-30: 40 mg via INTRAVENOUS
  Filled 2013-05-30: qty 4

## 2013-05-30 MED ORDER — WARFARIN VIDEO
Freq: Once | Status: AC
  Administered 2013-05-30: 18:00:00

## 2013-05-30 MED ORDER — ACETAMINOPHEN 325 MG PO TABS
ORAL_TABLET | ORAL | Status: AC
  Filled 2013-05-30: qty 2

## 2013-05-30 NOTE — Progress Notes (Signed)
POST OPERATIVE NOTE:  05/30/2013 [name] [id number]  VITALS: BP 144/77  Pulse 74  Temp(Src) 98.5 F (36.9 C) (Oral)  Resp 20  Ht 5\' 4"  (1.626 m)  Wt 161 lb 13.1 oz (73.4 kg)  BMI 27.76 kg/m2  SpO2 96%  Patient is status post multiple extractions with alveoloplasty and gross debridement of remaining dentition the operating room on 05/21/2013.  SUBJECTIVE: The patient has minimal oral complaints. Patient still has a few stitches that remain.  EXAM: There is no sign of oral infection, heme, or ooze. Sutures are loosely intact. Patient with generalized primary closure. Small palatal ulceration noted in the area of number.  ASSESSMENT: Post operative course is consistent with dental procedures performed in the operating room on 05/21/2013.  PLAN: 1. Continue salt water rinses every 2 hours while awake to aid healing. 2. Continue chlorhexidine rinses. Patient is to rinse with 15 MLS twice daily to aid in this infection of the oral cavity over the next 2-3 months. 3. Patient is to followup in dental medicine for evaluation of healing in one month. Patient is to call for this appointment. 4. Call if other problems arise with dental healing. 5. The patient to followup with a primary dentist of her choice for other dental restorations once cleared medically by the heart surgeon.   Lenn Cal, DDS

## 2013-05-30 NOTE — Progress Notes (Signed)
CARDIAC REHAB PHASE I   PRE:  Rate/Rhythm: 75SR  BP:  Supine:   Sitting: 148/77  Standing:    SaO2: 95%RA  MODE:  Ambulation: 300 ft   POST:  Rate/Rhythm: 86SR  BP:  Supine:   Sitting: 127/89  Standing:    SaO2: 96%RA 1030-1050 Pt walked 300 ft on RA with rolling walker and asst x 2 with steady gait. Pt c/o knees weak so we walked as x 2. Pt stopped to rest once at 150 ft. To recliner after walk. Can be asst x 1. Said she had rolling walker at home.    Graylon Good, RN BSN  05/30/2013 10:46 AM

## 2013-05-30 NOTE — Discharge Summary (Signed)
[address]       [place] [address]             [phone]              Discharge Summary  Name: [name] DOB: 1946/06/04 67 y.o. MRN: [id number]   Admission Date: 05/14/2013 Discharge Date: 05/31/2013    Admitting Diagnosis: Dyspnea on exertion Fatigue   Discharge Diagnosis:  Severe mitral regurgitation Severe class III congestive heart failure Atrial fibrillation Expected postoperative blood loss anemia  Past Medical History  Diagnosis Date  . Arthritis   . Hypertension   . A-fib   . High cholesterol   . Hypothyroidism   . Depression   . Shortness of breath     exertion  . Pacemaker     icd  . CHF (congestive heart failure)   . GERD (gastroesophageal reflux disease)   . [id number])       Procedures: DENTAL EXTRACTIONS (2,4,18) -05/21/2013  2 quadrant alveoloplasty  Gross debridement of remaining dentition  MITRAL VALVE REPAIR  - 05/24/2013  Triangular resection of prolapsed P2 segment  28 mm Edwards Physio II ring annuloplasty    HPI:  The patient is a 67 y.o. 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  She had previously had an echocardiogram in December 2013 which showed a fail posterior mitral leaflet with severe mitral regurgitation.  She presented to the ER on the date of admission complaining of worsening dyspnea on exertion and fatigue, with associated PND and orthopnea.  A CT angiogram ruled out pulmonary embolus, but she was found to have pulmonary edema and pleural effusions consistent with CHF.  She was subsequently admitted for further evaluation and treatment.     Hospital Course:  The patient was admitted to [facility] on 05/14/2013. She was seen by cardiology and a 2D echo was performed, which confirmed severe mitral regurgitation with flail posterior leaflet.  She was  started on aggressive diuresis and medical management of her CHF. Cardiac catheterization on 05/16/2013 showed no coronary artery disease.  A cardiac surgery consult with Dr. Prescott Gum was obtained for consideration of valvular intervention. He agreed with the need for mitral valve repair/replacement. All risks, benefits and alternatives of surgery were explained in detail, and the patient agreed to proceed. A pre-op dental consult was ordered, and Dr. Enrique Sack felt the patient would require extractions prior to proceeding with cardiac surgery.  These were performed in the operating room on 05/21/2013.  The patient recovered well from this procedure, and remained stable from a CHF standpoint.  She was taken to the operating room on 05/24/2013 and underwent mitral valve repair as described above.  The postoperative course was notable for early atrial fibrillation, requiring treatment with Amiodarone and Lopressor. She converted to sinus rhythm, but had a recurrence of atrial fib, requiring both IV Amiodarone and Cardizem.  She ultimately did convert to sinus rhythm.  Because of her recurrent arrhythmias, she was started on Coumadin. She remained in the ICU for observation and rhythm management, and was transferred to stepdown on postop day 5.  She has otherwise done well postoperatively.  She is maintaining sinus rhythm.  Lasix was started for a mild volume overload, and she is diuresing well.  She is ambulating with cardiac rehab and tolerating a regular diet.  INR remains subtherapeutic, but is trending upward.  Incisions are healing well, and vital signs are stable.  She is currently medically stable for discharge on today's date.    Recent vital signs:  Filed Vitals:   05/30/13 0431  BP: 144/77  Pulse: 74  Temp: 98.5 F (36.9 C)  Resp: 20    Recent laboratory studies:  CBC: Recent Labs  05/29/13 0352 05/30/13 0420  WBC 8.2 7.2  HGB 9.9* 8.9*  HCT 28.7* 25.9*  PLT 213 243   BMET:  Recent  Labs  05/29/13 0352 05/30/13 0420  NA 132* 135  K 4.6 3.6  CL 94* 96  CO2 27 27  GLUCOSE 117* 113*  BUN 18 14  CREATININE 0.81 0.84  CALCIUM 8.5 8.8    PT/INR:  Recent Labs  05/30/13 0420  LABPROT 14.7  INR 1.17     Discharge Medications:     Medication List    STOP taking these medications       GOODY HEADACHE PO     ondansetron 4 MG tablet  Commonly known as:  ZOFRAN      TAKE these medications       acetaminophen 500 MG tablet  Commonly known as:  TYLENOL  Take 1,000 mg by mouth every 6 (six) hours as needed. For pain     amiodarone 200 MG tablet  Commonly known as:  PACERONE  Take 1 tablet (200 mg total) by mouth every 12 (twelve) hours.     aspirin 81 MG EC tablet  Take 1 tablet (81 mg total) by mouth daily.     diazepam 2 MG tablet  Commonly known as:  VALIUM  Take 2 mg by mouth 3 (three) times daily. For anxiety     fish oil-omega-3 fatty acids 1000 MG capsule  Take 1 g by mouth daily.     furosemide 40 MG tablet  Commonly known as:  LASIX  Take 1 tablet (40 mg total) by mouth daily. X 1 week     hydrOXYzine 10 MG tablet  Commonly known as:  ATARAX/VISTARIL  Take 10 mg by mouth at bedtime.     levothyroxine 100 MCG tablet  Commonly known as:  SYNTHROID, LEVOTHROID  Take 1 tablet (100 mcg total) by mouth daily before breakfast.     lisinopril 5 MG tablet  Commonly known as:  PRINIVIL,ZESTRIL  Take 1 tablet (5 mg total) by mouth daily.     metoprolol succinate 50 MG 24 hr tablet  Commonly known as:  TOPROL-XL  Take 50 mg by mouth daily. Take with or immediately following a meal.     MURINE TEARS FOR DRY EYES OP  Place 1 drop into both eyes as needed. For dry eyes     pantoprazole 40 MG tablet  Commonly known as:  PROTONIX  Take 40 mg by mouth daily.     PARoxetine 30 MG tablet  Commonly known as:  PAXIL  Take 60 mg by mouth daily.     potassium chloride SA 20 MEQ tablet  Commonly known as:  KLOR-CON M20  Take 1 tablet (20 mEq  total) by mouth daily. X 1 week     simvastatin 40 MG tablet  Commonly known as:  ZOCOR  Take 0.5 tablets (20 mg total) by mouth every evening.     traMADol 50 MG tablet  Commonly known as:  ULTRAM  Take 1 tablet (50 mg total) by mouth every 6 (six) hours as needed for pain.     warfarin 3 MG tablet  Commonly known as:  COUMADIN  Take 1 tablet (3 mg total) by mouth daily. Or as directed by MD         Discharge Instructions:  The patient is to refrain from driving, heavy lifting or strenuous activity.  May shower daily and clean incisions with soap and water.  May resume regular diet.   Follow Up: Follow-up Information   Follow up with VAN Wilber Oliphant, MD On 06/19/2013. (Have a chest x-ray at 1:30 at [facility], then see MD at 2:30)    Specialty:  Cardiothoracic Surgery   Contact information:   [address] [place] [address] [phone]       Follow up with Clent Demark, MD. Schedule an appointment as soon as possible for a visit in 2 weeks.   Specialty:  Cardiology   Contact information:   [place] [address] [phone]       Follow up On 06/03/2013. (Have a PT/INR (bloodwork for Coumadin) by the home health RN-results to be called to Dr. Zenia Resides office )       Follow up with Lenn Cal, DDS. (Follow up as directed )    Specialty:  Dentistry   Contact information:   [place] [address] [place], [place] 05/30/2013, 9:11 AM

## 2013-05-30 NOTE — Progress Notes (Addendum)
      [address]       [place] [address]             [phone]        6 Days Post-Op Procedure(s) (LRB): MITRAL VALVE REPAIR (MVR) (N/A) INTRAOPERATIVE TRANSESOPHAGEAL ECHOCARDIOGRAM (N/A)  Subjective: [place] with "upset stomach". Tolerating a "regular diet"  Objective: Vital signs in last 24 hours: Temp:  [98.5 F (36.9 C)-99.9 F (37.7 C)] 98.5 F (36.9 C) (08/14 0431) Pulse Rate:  [70-75] 74 (08/14 0431) Cardiac Rhythm:  [-] Normal sinus rhythm (08/14 0431) Resp:  [16-26] 20 (08/14 0431) BP: (111-144)/(45-117) 144/77 mmHg (08/14 0431) SpO2:  [95 %-100 %] 96 % (08/14 0431) Weight:  [73.4 kg (161 lb 13.1 oz)] 73.4 kg (161 lb 13.1 oz) (08/14 0249)  Pre op weight 67.6 kg Current Weight  05/30/13 73.4 kg (161 lb 13.1 oz)      Intake/Output from previous day: 08/13 0701 - 08/14 0700 In: 403 [P.O.:300; I.V.:103] Out: 0    Physical Exam:  Cardiovascular: RRR, no murmurs, gallops, or rubs. Pulmonary: Clear to auscultation bilaterally; no rales, wheezes, or rhonchi. Abdomen: Soft, non tender, bowel sounds present. Extremities: Mild bilateral lower extremity edema. Wound: Clean and dry.  No erythema or signs of infection.  Lab Results: CBC: Recent Labs  05/29/13 0352 05/30/13 0420  WBC 8.2 7.2  HGB 9.9* 8.9*  HCT 28.7* 25.9*  PLT 213 243   BMET:  Recent Labs  05/29/13 0352 05/30/13 0420  NA 132* 135  K 4.6 3.6  CL 94* 96  CO2 27 27  GLUCOSE 117* 113*  BUN 18 14  CREATININE 0.81 0.84  CALCIUM 8.5 8.8    PT/INR:  Lab Results  Component Value Date   INR 1.17 05/30/2013   INR 1.05 05/29/2013   INR 1.19 05/28/2013   ABG:  INR: Will add last result for INR, ABG once components are confirmed Will add last 4 CBG results once components are confirmed  Assessment/Plan:  1. CV - Previous a fib with RVR. Maintaining SR. On Amiodarone 400 bid, Lopressor 25 bid, and Coumadin. Will decrease Amiodarone to 200 bid as discussed with  Dr. Prescott Gum. Will also decrease Ecasa to 81 as is on Coumadin.Will restart low dose Lisinopril for better bp control. 2.  Pulmonary - CXR this am appears to show patient is rotated to the left, improving aeration overall, small bilateral pleural effusions L>R, cardiomegaly, and no pneumothorax. 3. Volume Overload - On daily Lasix. Will give IV this am. 4.  Acute blood loss anemia - H and H 8.9 and 25.9 this am. On Trinsicon. Patient states she is unable to take iron as it makes her "stomach crampy and sick". Will stop. 5.Supplement potassium 6.Possible discharge 1-2 days  ZIMMERMAN,DONIELLE MPA-C 05/30/2013,7:49 AM  DC pacing wires in am  tomorrow then home if she maintains NSR patient examined and medical record reviewed,agree with above note. [name],PETER 05/30/2013

## 2013-05-30 NOTE — Progress Notes (Signed)
Clinical Social Work Department CLINICAL SOCIAL WORK PLACEMENT NOTE 05/30/2013  Patient:  BARAAH, VANDELINDER  Account Number:  [id number] [place] date:  05/14/2013  Clinical Social Worker:  Megan Salon  Date/time:  05/30/2013 08:05 AM  Clinical Social Work is seeking post-discharge placement for this patient at the following level of care:   [place]   (*CSW will update this form in Epic as items are completed)   05/30/2013  Patient/family provided with [facility] Department of Clinical Social Work's list of facilities offering this level of care within the geographic area requested by the patient (or if unable, by the patient's family).  05/30/2013  Patient/family informed of their freedom to choose among providers that offer the needed level of care, that participate in Medicare, Medicaid or managed care program needed by the patient, have an available bed and are willing to accept the patient.  05/30/2013  Patient/family informed of [facility]' ownership interest in [facility], as well as of the fact that they are under no obligation to receive care at this facility.  PASARR submitted to EDS on 05/30/2013 PASARR number received from EDS on 05/30/2013  FL2 transmitted to all facilities in geographic area requested by pt/family on  05/30/2013 FL2 transmitted to all facilities within larger geographic area on   Patient informed that his/her managed care company has contracts with or will negotiate with  certain facilities, including the following:     Patient/family informed of bed offers received:   Patient chooses bed at  Physician recommends and patient chooses bed at    Patient to be transferred to  on   Patient to be transferred to facility by   The following physician request were entered in Epic:   Additional Comments:  Jeanette Caprice, MSW, [place]

## 2013-05-30 NOTE — Progress Notes (Signed)
Clinical Social Work Department BRIEF PSYCHOSOCIAL ASSESSMENT 05/30/2013  Patient:  [name], [name]     Account Number:  [id number]     Admit date:  05/14/2013  Clinical Social Worker:  Megan Salon  Date/Time:  05/30/2013 12:00 M  Referred by:  Care Management  Date Referred:  05/30/2013 Referred for  SNF Placement   Other Referral:   Interview type:  Patient Other interview type:    PSYCHOSOCIAL DATA Living Status:  ALONE Admitted from facility:   Level of care:   Primary support name:  Antonietta Breach Primary support relationship to patient:  CHILD, ADULT Degree of support available:   Good    CURRENT CONCERNS Current Concerns  Post-Acute Placement   Other Concerns:    SOCIAL WORK ASSESSMENT / PLAN Clinical Social Worker received referral for SNF placement at d/c. CSW introduced self and explained reason for visit. CSW explained SNF process. Patient reported she is unsure about SNF placement. Patient stated she will talk with her son and let CSW know tomorrow if she wants SNF. CSW will complete FL2 for MD's signature and will update tomorrow.   Assessment/plan status:  Psychosocial Support/Ongoing Assessment of Needs Other assessment/ plan:   Information/referral to community resources:   SNF information    PATIENT'S/FAMILY'S RESPONSE TO PLAN OF CARE: Patient states that she may go live with her sister for a little if she does not go home with her son. Patient states she will let CSW know tomorrow if she is interested in SNF.    Jeanette Caprice, MSW, [place]

## 2013-05-31 LAB — PROTIME-INR
INR: 1.25 (ref 0.00–1.49)
Prothrombin Time: 15.4 seconds — ABNORMAL HIGH (ref 11.6–15.2)

## 2013-05-31 MED ORDER — ACETAMINOPHEN 325 MG PO TABS
325.0000 mg | ORAL_TABLET | Freq: Four times a day (QID) | ORAL | Status: DC | PRN
  Administered 2013-05-31: 650 mg via ORAL
  Filled 2013-05-31: qty 2

## 2013-05-31 MED ORDER — TRAMADOL HCL 50 MG PO TABS: 50.0000 mg | ORAL_TABLET | Freq: Four times a day (QID) | ORAL | Status: DC | PRN

## 2013-05-31 MED ORDER — ASPIRIN 81 MG PO TBEC: 81.0000 mg | DELAYED_RELEASE_TABLET | Freq: Every day | ORAL | Status: DC

## 2013-05-31 MED ORDER — POTASSIUM CHLORIDE CRYS ER 20 MEQ PO TBCR: 20.0000 meq | EXTENDED_RELEASE_TABLET | Freq: Every day | ORAL | Status: DC

## 2013-05-31 MED ORDER — FUROSEMIDE 40 MG PO TABS: 40.0000 mg | ORAL_TABLET | Freq: Every day | ORAL | Status: DC

## 2013-05-31 MED ORDER — AMIODARONE HCL 200 MG PO TABS: 200.0000 mg | ORAL_TABLET | Freq: Two times a day (BID) | ORAL | Status: DC

## 2013-05-31 MED ORDER — WARFARIN SODIUM 3 MG PO TABS: 3.0000 mg | ORAL_TABLET | Freq: Every day | ORAL | Status: DC

## 2013-05-31 MED ORDER — LISINOPRIL 5 MG PO TABS: 5.0000 mg | ORAL_TABLET | Freq: Every day | ORAL | Status: DC

## 2013-05-31 MED ORDER — LEVOTHYROXINE SODIUM 100 MCG PO TABS: 100.0000 ug | ORAL_TABLET | Freq: Every day | ORAL | Status: DC

## 2013-05-31 NOTE — Progress Notes (Addendum)
       [address]       [place] [address]             [phone]          7 Days Post-Op Procedure(s) (LRB): MITRAL VALVE REPAIR (MVR) (N/A) INTRAOPERATIVE TRANSESOPHAGEAL ECHOCARDIOGRAM (N/A)  Subjective: Comfortable, no complaints.    Objective: Vital signs in last 24 hours: Patient Vitals for the past 24 hrs:  BP Temp Temp src Pulse Resp SpO2 Weight  05/31/13 0429 141/79 mmHg 98.8 F (37.1 C) Oral 73 20 93 % 159 lb 9.8 oz (72.4 kg)  05/30/13 1958 114/66 mmHg 99.1 F (37.3 C) Oral 71 20 98 % -  05/30/13 1350 133/74 mmHg 98.3 F (36.8 C) Oral 73 18 97 % -   Current Weight  05/31/13 159 lb 9.8 oz (72.4 kg)  Pre op weight 67.6 kg    Intake/Output from previous day: 08/14 0701 - 08/15 0700 In: 600 [P.O.:600] Out: 501 [Urine:500; Stool:1]    PHYSICAL EXAM:  Heart: RRR Lungs: Clear Wound: Clean and dry Extremities: Trace LE edema    Lab Results: CBC: Recent Labs  05/29/13 0352 05/30/13 0420  WBC 8.2 7.2  HGB 9.9* 8.9*  HCT 28.7* 25.9*  PLT 213 243   BMET:  Recent Labs  05/29/13 0352 05/30/13 0420  NA 132* 135  K 4.6 3.6  CL 94* 96  CO2 27 27  GLUCOSE 117* 113*  BUN 18 14  CREATININE 0.81 0.84  CALCIUM 8.5 8.8    PT/INR:  Recent Labs  05/31/13 0425  LABPROT 15.4*  INR 1.25      Assessment/Plan: S/P Procedure(s) (LRB): MITRAL VALVE REPAIR (MVR) (N/A) INTRAOPERATIVE TRANSESOPHAGEAL ECHOCARDIOGRAM (N/A) CV- no further arrhythmias, BPs improving.  Continue current meds. Vol overload- continue diuresis. Disp- pt has now decided to stay with her sister/niece post-discharge.  She is doing well and should be ready to go home later today or in am once arrangements are completed.  Will d/c EPWs.   LOS: 17 days    Moyer,Jessica H 05/31/2013  I have seen and examined [name] and agree with the above assessment  and plan.  Grace Isaac MD [place] [phone] Office [phone] 05/31/2013 11:07 AM

## 2013-05-31 NOTE — Progress Notes (Signed)
CARDIAC REHAB PHASE I   PRE:  Rate/Rhythm: 73 SR    BP: sitting 144/76    SaO2: 96 RA  MODE:  Ambulation: 230 ft   POST:  Rate/Rhythm: 82 SR    BP: sitting 144/73     SaO2: 96 RA  Pt c/o tired legs today. Sts this is a chronic problem for her. Multiple rests and decrease distance this am. Has RW at home, sts it has a seat. Ed completed with comprehension. Thinking about [facility] in [place] and agreed for them to call her at home. Set up d/c video. [place], [place], ACSM 05/31/2013 9:38 AM

## 2013-05-31 NOTE — Progress Notes (Signed)
CSW spoke with patient who stated she is going to go home with her sister. CSW signing off. Please re consult is social work needs arise.  Jeanette Caprice, MSW, [place]

## 2013-05-31 NOTE — Progress Notes (Signed)
Physical Therapy Treatment Patient Details Name: [name] MRN: [id number] DOB: Apr 22, 1946 Today's Date: 05/31/2013 Time: [id number] PT Time Calculation (min): 19 min  PT Assessment / Plan / Recommendation  History of Present Illness Pt is s/p Mitral valve rep.   PT Comments   Pt continues to demonstrate improvements in functional mobility. Able to ambulate without physical assist. Patient is limited by pain in back at this time (she reports this is chronic). Feel patient may benefit from [facility], however, if family is able to provide 24/7 assist, pt may dc with assist and HHPT. Will continue to see as indicated and progress as tolerated.   Follow Up Recommendations  SNF vs HHPT and 24/7 Supervision/Assist           Equipment Recommendations  Rolling walker with 5" wheels;3in1 (PT)    Recommendations for Other Services    Frequency Min 3X/week   Progress towards PT Goals    Plan Current plan remains appropriate    Precautions / Restrictions Precautions Precautions: Sternal Restrictions Weight Bearing Restrictions:  (sternal precautions)   Pertinent Vitals/Pain 5/10 back pain (chronic) 2/10 chest discomfort    Mobility  Bed Mobility Bed Mobility: Not assessed Transfers Transfers: Sit to Stand;Stand to Sit Sit to Stand: 4: Min assist Stand to Sit: 4: Min guard Details for Transfer Assistance: VCs for technique, assist for elevation and stability Ambulation/Gait Ambulation/Gait Assistance: 5: Supervision Ambulation Distance (Feet): 200 Feet Assistive device: Rolling walker Ambulation/Gait Assistance Details: 2 standing rest breaks secondary to back pain Gait Pattern: Step-through pattern;Decreased stride length;Shuffle;Trunk flexed;Narrow base of support Gait velocity: decreased General Gait Details: unsteady with ambulation, easily fatigued      PT Goals (current goals can now be found in the care plan section) Acute Rehab PT Goals Patient Stated Goal: to go  home PT Goal Formulation: With patient Time For Goal Achievement: 06/10/13 Potential to Achieve Goals: Good  Visit Information  Last PT Received On: 05/31/13 Assistance Needed: +1 History of Present Illness: Pt is s/p Mitral valve rep.    Subjective Data  Subjective: I have been feeling like im going to get sick Patient Stated Goal: to go home   Cognition  Cognition Arousal/Alertness: Awake/alert Behavior During Therapy: WFL for tasks assessed/performed Overall Cognitive Status: Within Functional Limits for tasks assessed    Balance  Balance Balance Assessed: Yes Static Standing Balance Static Standing - Balance Support: Bilateral upper extremity supported Static Standing - Level of Assistance: 4: Min assist High Level Balance High Level Balance Activites: Side stepping;Backward walking;Direction changes;Turns High Level Balance Comments: assist for stability VCs for positioning with Rw  End of Session PT - End of Session Equipment Utilized During Treatment: Gait belt Activity Tolerance: Patient limited by fatigue Patient left: in chair;with call bell/phone within reach Nurse Communication: Mobility status   GP     [name] 05/31/2013, 1:22 PM Alben Deeds, [name]  [phone]

## 2013-05-31 NOTE — Progress Notes (Signed)
Removed epicardial wires and pt to remain on bedrest for one hour. Three wires intact. Frequent vitals will be documented.  Removed CT suture and applied benzoin and 1/2" steri.  Pt tolerated procedure well without any issues.  Will continue to monitor. Payton Emerald

## 2013-05-31 NOTE — Progress Notes (Signed)
Pt given signs and symptoms of infection. Pt/family given discharge instructions, medication lists, follow up appointments, and when to call the doctor.  Pt/family verbalizes understanding. All questions answered [facility]

## 2013-06-03 DIAGNOSIS — I4891 Unspecified atrial fibrillation: Secondary | ICD-10-CM | POA: Diagnosis not present

## 2013-06-03 DIAGNOSIS — Z7901 Long term (current) use of anticoagulants: Secondary | ICD-10-CM | POA: Diagnosis not present

## 2013-06-03 DIAGNOSIS — I509 Heart failure, unspecified: Secondary | ICD-10-CM | POA: Diagnosis not present

## 2013-06-03 DIAGNOSIS — Z48812 Encounter for surgical aftercare following surgery on the circulatory system: Secondary | ICD-10-CM | POA: Diagnosis not present

## 2013-06-03 DIAGNOSIS — F329 Major depressive disorder, single episode, unspecified: Secondary | ICD-10-CM | POA: Diagnosis not present

## 2013-06-03 DIAGNOSIS — I5023 Acute on chronic systolic (congestive) heart failure: Secondary | ICD-10-CM | POA: Diagnosis not present

## 2013-06-04 DIAGNOSIS — Z48812 Encounter for surgical aftercare following surgery on the circulatory system: Secondary | ICD-10-CM | POA: Diagnosis not present

## 2013-06-04 DIAGNOSIS — F329 Major depressive disorder, single episode, unspecified: Secondary | ICD-10-CM | POA: Diagnosis not present

## 2013-06-04 DIAGNOSIS — I4891 Unspecified atrial fibrillation: Secondary | ICD-10-CM | POA: Diagnosis not present

## 2013-06-04 DIAGNOSIS — I5023 Acute on chronic systolic (congestive) heart failure: Secondary | ICD-10-CM | POA: Diagnosis not present

## 2013-06-04 DIAGNOSIS — I509 Heart failure, unspecified: Secondary | ICD-10-CM | POA: Diagnosis not present

## 2013-06-06 DIAGNOSIS — Z48812 Encounter for surgical aftercare following surgery on the circulatory system: Secondary | ICD-10-CM | POA: Diagnosis not present

## 2013-06-06 DIAGNOSIS — I509 Heart failure, unspecified: Secondary | ICD-10-CM | POA: Diagnosis not present

## 2013-06-06 DIAGNOSIS — F329 Major depressive disorder, single episode, unspecified: Secondary | ICD-10-CM | POA: Diagnosis not present

## 2013-06-06 DIAGNOSIS — I4891 Unspecified atrial fibrillation: Secondary | ICD-10-CM | POA: Diagnosis not present

## 2013-06-06 DIAGNOSIS — I5023 Acute on chronic systolic (congestive) heart failure: Secondary | ICD-10-CM | POA: Diagnosis not present

## 2013-06-10 DIAGNOSIS — I5023 Acute on chronic systolic (congestive) heart failure: Secondary | ICD-10-CM | POA: Diagnosis not present

## 2013-06-10 DIAGNOSIS — F329 Major depressive disorder, single episode, unspecified: Secondary | ICD-10-CM | POA: Diagnosis not present

## 2013-06-10 DIAGNOSIS — I4891 Unspecified atrial fibrillation: Secondary | ICD-10-CM | POA: Diagnosis not present

## 2013-06-10 DIAGNOSIS — Z48812 Encounter for surgical aftercare following surgery on the circulatory system: Secondary | ICD-10-CM | POA: Diagnosis not present

## 2013-06-10 DIAGNOSIS — I509 Heart failure, unspecified: Secondary | ICD-10-CM | POA: Diagnosis not present

## 2013-06-11 ENCOUNTER — Telehealth: Payer: Self-pay | Admitting: Internal Medicine

## 2013-06-11 DIAGNOSIS — F329 Major depressive disorder, single episode, unspecified: Secondary | ICD-10-CM | POA: Diagnosis not present

## 2013-06-11 DIAGNOSIS — I509 Heart failure, unspecified: Secondary | ICD-10-CM | POA: Diagnosis not present

## 2013-06-11 DIAGNOSIS — I5023 Acute on chronic systolic (congestive) heart failure: Secondary | ICD-10-CM | POA: Diagnosis not present

## 2013-06-11 DIAGNOSIS — I4891 Unspecified atrial fibrillation: Secondary | ICD-10-CM | POA: Diagnosis not present

## 2013-06-11 DIAGNOSIS — Z48812 Encounter for surgical aftercare following surgery on the circulatory system: Secondary | ICD-10-CM | POA: Diagnosis not present

## 2013-06-11 NOTE — Telephone Encounter (Signed)
New problem   Amy/[facility] want to know if pt suppose to take potassium 30meq once a day. Please call to confirm.

## 2013-06-11 NOTE — Telephone Encounter (Signed)
Left Amy a message to call back

## 2013-06-12 DIAGNOSIS — I4891 Unspecified atrial fibrillation: Secondary | ICD-10-CM | POA: Diagnosis not present

## 2013-06-12 DIAGNOSIS — F329 Major depressive disorder, single episode, unspecified: Secondary | ICD-10-CM | POA: Diagnosis not present

## 2013-06-12 DIAGNOSIS — I509 Heart failure, unspecified: Secondary | ICD-10-CM | POA: Diagnosis not present

## 2013-06-12 DIAGNOSIS — I5023 Acute on chronic systolic (congestive) heart failure: Secondary | ICD-10-CM | POA: Diagnosis not present

## 2013-06-12 DIAGNOSIS — Z48812 Encounter for surgical aftercare following surgery on the circulatory system: Secondary | ICD-10-CM | POA: Diagnosis not present

## 2013-06-13 NOTE — Telephone Encounter (Signed)
Only to take Potassium for 1 week per discharge summary.  Nurse aware

## 2013-06-14 DIAGNOSIS — F329 Major depressive disorder, single episode, unspecified: Secondary | ICD-10-CM | POA: Diagnosis not present

## 2013-06-14 DIAGNOSIS — Z48812 Encounter for surgical aftercare following surgery on the circulatory system: Secondary | ICD-10-CM | POA: Diagnosis not present

## 2013-06-14 DIAGNOSIS — I5023 Acute on chronic systolic (congestive) heart failure: Secondary | ICD-10-CM | POA: Diagnosis not present

## 2013-06-14 DIAGNOSIS — I4891 Unspecified atrial fibrillation: Secondary | ICD-10-CM | POA: Diagnosis not present

## 2013-06-14 DIAGNOSIS — I509 Heart failure, unspecified: Secondary | ICD-10-CM | POA: Diagnosis not present

## 2013-06-18 ENCOUNTER — Other Ambulatory Visit: Payer: Self-pay | Admitting: *Deleted

## 2013-06-18 DIAGNOSIS — I340 Nonrheumatic mitral (valve) insufficiency: Secondary | ICD-10-CM

## 2013-06-18 DIAGNOSIS — I509 Heart failure, unspecified: Secondary | ICD-10-CM | POA: Diagnosis not present

## 2013-06-18 DIAGNOSIS — F329 Major depressive disorder, single episode, unspecified: Secondary | ICD-10-CM | POA: Diagnosis not present

## 2013-06-18 DIAGNOSIS — Z48812 Encounter for surgical aftercare following surgery on the circulatory system: Secondary | ICD-10-CM | POA: Diagnosis not present

## 2013-06-18 DIAGNOSIS — I5023 Acute on chronic systolic (congestive) heart failure: Secondary | ICD-10-CM | POA: Diagnosis not present

## 2013-06-18 DIAGNOSIS — I4891 Unspecified atrial fibrillation: Secondary | ICD-10-CM | POA: Diagnosis not present

## 2013-06-19 ENCOUNTER — Ambulatory Visit (INDEPENDENT_AMBULATORY_CARE_PROVIDER_SITE_OTHER): Payer: Self-pay | Admitting: Cardiothoracic Surgery

## 2013-06-19 ENCOUNTER — Ambulatory Visit
Admission: RE | Admit: 2013-06-19 | Discharge: 2013-06-19 | Disposition: A | Discharge status: Home / Self Care | Payer: Medicare Other | Source: Ambulatory Visit | Attending: Cardiothoracic Surgery | Admitting: Cardiothoracic Surgery

## 2013-06-19 ENCOUNTER — Encounter: Payer: Self-pay | Admitting: Cardiothoracic Surgery

## 2013-06-19 VITALS — BP 145/84 | HR 65 | Resp 16 | Ht 64.0 in | Wt 150.0 lb

## 2013-06-19 DIAGNOSIS — Z95810 Presence of automatic (implantable) cardiac defibrillator: Secondary | ICD-10-CM | POA: Diagnosis not present

## 2013-06-19 DIAGNOSIS — Z9889 Other specified postprocedural states: Secondary | ICD-10-CM

## 2013-06-19 DIAGNOSIS — I340 Nonrheumatic mitral (valve) insufficiency: Secondary | ICD-10-CM

## 2013-06-19 DIAGNOSIS — I059 Rheumatic mitral valve disease, unspecified: Secondary | ICD-10-CM

## 2013-06-19 NOTE — Progress Notes (Signed)
PCP is Ria Bush, MD Referring Provider is Clent Demark, MD  Chief Complaint  Patient presents with  . Routine Post Op    3 wk f/u with cxr s/p MVR 05/24/13...sees Dr. Terrence Dupont Friday    HPI: 67 year old female returns for one month followup after undergoing mitral valve repair for severe mitral regurgitation and class for CHF. She developed postoperative atrial fibrillation and has been taking amiodarone and Coumadin and maintaining sinus rhythm. Her symptoms of heart fair have resolved and her surgical incisions are healing well. She's gradually gaining strength but is depressed over the recent death of her younger sister.  Her cardiologist is Dr. Terrence Dupont and she has an appointment to see him later this month. She denies any bleeding complications from Coumadin and has had a INR checked by home health nurse.   Past Medical History  Diagnosis Date  . Arthritis   . Hypertension   . A-fib   . High cholesterol   . Hypothyroidism   . Depression   . Shortness of breath     exertion  . Pacemaker     icd  . CHF (congestive heart failure)   . GERD (gastroesophageal reflux disease)   . [id number])     Past Surgical History  Procedure Laterality Date  . Cardiac defibrillator placement    . Abdominal hysterectomy    . Hernia repair  as child    x 2  . Tee without cardioversion  09/24/2012    Procedure: TRANSESOPHAGEAL ECHOCARDIOGRAM (TEE);  Surgeon: Fay Records, MD;  Location: [facility] ENDOSCOPY;  Service: Cardiovascular;  Laterality: N/A;  . Multiple extractions with alveoloplasty N/A 05/21/2013    Procedure: Extraction of tooth #'s 2,4,18 with alveoloplasty and gross debridement of remaining teeth;  Surgeon: Lenn Cal, DDS;  Location: [place];  Service: Oral Surgery;  Laterality: N/A;  . Mitral valve repair N/A 05/24/2013    Procedure: MITRAL VALVE REPAIR (MVR);  Surgeon: Ivin Poot, MD;  Location: [place];  Service: Open Heart Surgery;  Laterality: N/A;  .  Intraoperative transesophageal echocardiogram N/A 05/24/2013    Procedure: INTRAOPERATIVE TRANSESOPHAGEAL ECHOCARDIOGRAM;  Surgeon: Ivin Poot, MD;  Location: [place];  Service: Open Heart Surgery;  Laterality: N/A;    No family history on file.  Social History History  Substance Use Topics  . Smoking status: Never Smoker   . Smokeless tobacco: Never Used  . Alcohol Use: No    Current Outpatient Prescriptions  Medication Sig Dispense Refill  . acetaminophen (TYLENOL) 500 MG tablet Take 1,000 mg by mouth every 6 (six) hours as needed. For pain      . amiodarone (PACERONE) 200 MG tablet Take 1 tablet (200 mg total) by mouth every 12 (twelve) hours.  60 tablet  1  . aspirin EC 81 MG EC tablet Take 1 tablet (81 mg total) by mouth daily.      . diazepam (VALIUM) 2 MG tablet Take 2 mg by mouth 3 (three) times daily. For anxiety      . fish oil-omega-3 fatty acids 1000 MG capsule Take 1 g by mouth daily.      . hydrOXYzine (ATARAX/VISTARIL) 10 MG tablet Take 10 mg by mouth at bedtime.       Marland Kitchen levothyroxine (SYNTHROID, LEVOTHROID) 100 MCG tablet Take 1 tablet (100 mcg total) by mouth daily before breakfast.  30 tablet  1  . lisinopril (PRINIVIL,ZESTRIL) 5 MG tablet Take 1 tablet (5 mg total) by mouth daily.  30 tablet  1  . metoprolol succinate (TOPROL-XL) 50 MG 24 hr tablet Take 50 mg by mouth daily. Take with or immediately following a meal.      . pantoprazole (PROTONIX) 40 MG tablet Take 40 mg by mouth daily.      Marland Kitchen PARoxetine (PAXIL) 30 MG tablet Take 60 mg by mouth daily.       . Polyvinyl Alcohol-Povidone (MURINE TEARS FOR DRY EYES OP) Place 1 drop into both eyes as needed. For dry eyes      . simvastatin (ZOCOR) 40 MG tablet Take 0.5 tablets (20 mg total) by mouth every evening.  30 tablet    . warfarin (COUMADIN) 3 MG tablet Take 1 tablet (3 mg total) by mouth daily. Or as directed by MD  60 tablet  1   No current facility-administered medications for this visit.    Allergies   Allergen Reactions  . Codeine Nausea And Vomiting  . Meperidine Hcl Nausea And Vomiting    Review of Systems No fever Surgical incision well-healed No symptoms of CHF No palpitations  BP 145/84  Pulse 65  Resp 16  Ht 5\' 4"  (1.626 m)  Wt 150 lb (68.04 kg)  BMI 25.73 kg/m2  SpO2 98% Physical Exam Alert and comfortable Lungs clear Sternal incision well-healed Heart rhythm regular, sinus rhythm. No cardiac murmur or gallop  Diagnostic Tests: Chest x-ray clear with out pleural effusion  Impression: Doing well 5 weeks postop mitral valve repair. Will reduce amiodarone to 200 mg once a day and stop Coumadin, continue 1 aspirin daily  Plan: She will return for followup of her recovery in one month.

## 2013-06-19 NOTE — Discharge Summary (Signed)
patient examined and medical record reviewed,agree with above note. [name],[name] 06/19/2013

## 2013-06-20 ENCOUNTER — Encounter: Payer: Self-pay | Admitting: Family Medicine

## 2013-06-20 DIAGNOSIS — I5023 Acute on chronic systolic (congestive) heart failure: Secondary | ICD-10-CM | POA: Diagnosis not present

## 2013-06-20 DIAGNOSIS — F329 Major depressive disorder, single episode, unspecified: Secondary | ICD-10-CM | POA: Diagnosis not present

## 2013-06-20 DIAGNOSIS — I509 Heart failure, unspecified: Secondary | ICD-10-CM | POA: Diagnosis not present

## 2013-06-20 DIAGNOSIS — I4891 Unspecified atrial fibrillation: Secondary | ICD-10-CM | POA: Diagnosis not present

## 2013-06-20 DIAGNOSIS — Z48812 Encounter for surgical aftercare following surgery on the circulatory system: Secondary | ICD-10-CM | POA: Diagnosis not present

## 2013-06-21 DIAGNOSIS — I428 Other cardiomyopathies: Secondary | ICD-10-CM | POA: Diagnosis not present

## 2013-06-21 DIAGNOSIS — E039 Hypothyroidism, unspecified: Secondary | ICD-10-CM | POA: Diagnosis not present

## 2013-06-21 DIAGNOSIS — I10 Essential (primary) hypertension: Secondary | ICD-10-CM | POA: Diagnosis not present

## 2013-06-21 DIAGNOSIS — I509 Heart failure, unspecified: Secondary | ICD-10-CM | POA: Diagnosis not present

## 2013-06-21 DIAGNOSIS — E7800 Pure hypercholesterolemia, unspecified: Secondary | ICD-10-CM | POA: Diagnosis not present

## 2013-06-24 DIAGNOSIS — I4891 Unspecified atrial fibrillation: Secondary | ICD-10-CM | POA: Diagnosis not present

## 2013-06-24 DIAGNOSIS — I509 Heart failure, unspecified: Secondary | ICD-10-CM | POA: Diagnosis not present

## 2013-06-24 DIAGNOSIS — F329 Major depressive disorder, single episode, unspecified: Secondary | ICD-10-CM | POA: Diagnosis not present

## 2013-06-24 DIAGNOSIS — I5023 Acute on chronic systolic (congestive) heart failure: Secondary | ICD-10-CM | POA: Diagnosis not present

## 2013-06-24 DIAGNOSIS — Z48812 Encounter for surgical aftercare following surgery on the circulatory system: Secondary | ICD-10-CM | POA: Diagnosis not present

## 2013-06-26 DIAGNOSIS — I5023 Acute on chronic systolic (congestive) heart failure: Secondary | ICD-10-CM | POA: Diagnosis not present

## 2013-06-26 DIAGNOSIS — F329 Major depressive disorder, single episode, unspecified: Secondary | ICD-10-CM | POA: Diagnosis not present

## 2013-06-26 DIAGNOSIS — Z48812 Encounter for surgical aftercare following surgery on the circulatory system: Secondary | ICD-10-CM | POA: Diagnosis not present

## 2013-06-26 DIAGNOSIS — I4891 Unspecified atrial fibrillation: Secondary | ICD-10-CM | POA: Diagnosis not present

## 2013-06-26 DIAGNOSIS — I509 Heart failure, unspecified: Secondary | ICD-10-CM | POA: Diagnosis not present

## 2013-07-01 DIAGNOSIS — F329 Major depressive disorder, single episode, unspecified: Secondary | ICD-10-CM | POA: Diagnosis not present

## 2013-07-01 DIAGNOSIS — I5023 Acute on chronic systolic (congestive) heart failure: Secondary | ICD-10-CM | POA: Diagnosis not present

## 2013-07-01 DIAGNOSIS — I509 Heart failure, unspecified: Secondary | ICD-10-CM | POA: Diagnosis not present

## 2013-07-01 DIAGNOSIS — Z48812 Encounter for surgical aftercare following surgery on the circulatory system: Secondary | ICD-10-CM | POA: Diagnosis not present

## 2013-07-01 DIAGNOSIS — I4891 Unspecified atrial fibrillation: Secondary | ICD-10-CM | POA: Diagnosis not present

## 2013-07-03 DIAGNOSIS — Z48812 Encounter for surgical aftercare following surgery on the circulatory system: Secondary | ICD-10-CM | POA: Diagnosis not present

## 2013-07-03 DIAGNOSIS — I4891 Unspecified atrial fibrillation: Secondary | ICD-10-CM | POA: Diagnosis not present

## 2013-07-03 DIAGNOSIS — I509 Heart failure, unspecified: Secondary | ICD-10-CM | POA: Diagnosis not present

## 2013-07-03 DIAGNOSIS — I5023 Acute on chronic systolic (congestive) heart failure: Secondary | ICD-10-CM | POA: Diagnosis not present

## 2013-07-03 DIAGNOSIS — F329 Major depressive disorder, single episode, unspecified: Secondary | ICD-10-CM | POA: Diagnosis not present

## 2013-07-24 ENCOUNTER — Ambulatory Visit: Payer: Self-pay | Admitting: Cardiothoracic Surgery

## 2013-07-29 ENCOUNTER — Other Ambulatory Visit: Payer: Self-pay | Admitting: *Deleted

## 2013-07-29 DIAGNOSIS — Z9889 Other specified postprocedural states: Secondary | ICD-10-CM

## 2013-07-29 DIAGNOSIS — I340 Nonrheumatic mitral (valve) insufficiency: Secondary | ICD-10-CM

## 2013-07-31 ENCOUNTER — Encounter: Payer: Medicare Other | Admitting: Cardiothoracic Surgery

## 2013-08-23 ENCOUNTER — Other Ambulatory Visit: Payer: Self-pay | Admitting: *Deleted

## 2013-08-23 NOTE — Telephone Encounter (Signed)
I think this was a hospital patient.  Looks like she cancelled her New Patient Appointment with you.  Last TSH & FT4 was done while in hospital on 05/15/2013.  Refill?

## 2013-08-25 NOTE — Telephone Encounter (Signed)
Pt has not established with myself.  Denied meds. - plz call to see if pt wants to establish with [place] and place on next new pt appt.

## 2013-08-26 NOTE — Telephone Encounter (Signed)
I left a message on patient's voice mail for her to call back if she'd like to schedule a new patient appointment.  I let her know the first available appointment is 12/12/13.

## 2013-09-09 DIAGNOSIS — E7800 Pure hypercholesterolemia, unspecified: Secondary | ICD-10-CM | POA: Diagnosis not present

## 2013-09-09 DIAGNOSIS — E039 Hypothyroidism, unspecified: Secondary | ICD-10-CM | POA: Diagnosis not present

## 2013-09-09 DIAGNOSIS — R7309 Other abnormal glucose: Secondary | ICD-10-CM | POA: Diagnosis not present

## 2013-09-09 DIAGNOSIS — I10 Essential (primary) hypertension: Secondary | ICD-10-CM | POA: Diagnosis not present

## 2013-09-25 ENCOUNTER — Encounter: Payer: Self-pay | Admitting: *Deleted

## 2013-10-22 ENCOUNTER — Encounter: Payer: Medicare Other | Admitting: Internal Medicine

## 2013-10-25 ENCOUNTER — Encounter: Payer: Self-pay | Admitting: Internal Medicine

## 2013-10-25 ENCOUNTER — Ambulatory Visit (INDEPENDENT_AMBULATORY_CARE_PROVIDER_SITE_OTHER): Payer: Medicare Other | Admitting: Internal Medicine

## 2013-10-25 VITALS — BP 158/92 | HR 55 | Ht 64.0 in | Wt 156.8 lb

## 2013-10-25 DIAGNOSIS — I4901 Ventricular fibrillation: Secondary | ICD-10-CM

## 2013-10-25 DIAGNOSIS — E7800 Pure hypercholesterolemia, unspecified: Secondary | ICD-10-CM | POA: Diagnosis not present

## 2013-10-25 DIAGNOSIS — I428 Other cardiomyopathies: Secondary | ICD-10-CM | POA: Diagnosis not present

## 2013-10-25 DIAGNOSIS — Z95810 Presence of automatic (implantable) cardiac defibrillator: Secondary | ICD-10-CM | POA: Diagnosis not present

## 2013-10-25 DIAGNOSIS — I10 Essential (primary) hypertension: Secondary | ICD-10-CM | POA: Diagnosis not present

## 2013-10-25 DIAGNOSIS — I509 Heart failure, unspecified: Secondary | ICD-10-CM | POA: Diagnosis not present

## 2013-10-25 DIAGNOSIS — I4891 Unspecified atrial fibrillation: Secondary | ICD-10-CM | POA: Diagnosis not present

## 2013-10-25 DIAGNOSIS — I5022 Chronic systolic (congestive) heart failure: Secondary | ICD-10-CM | POA: Diagnosis not present

## 2013-10-25 DIAGNOSIS — E785 Hyperlipidemia, unspecified: Secondary | ICD-10-CM

## 2013-10-25 LAB — MDC_IDC_ENUM_SESS_TYPE_INCLINIC
Battery Remaining Longevity: 79.2 mo
Date Time Interrogation Session: 20150109173039
HighPow Impedance: 35.1042
Implantable Pulse Generator Serial Number: 745406
Lead Channel Pacing Threshold Amplitude: 0.75 V
Lead Channel Pacing Threshold Pulse Width: 0.4 ms
Lead Channel Setting Pacing Amplitude: 2.5 V
MDC IDC MSMT LEADCHNL RV IMPEDANCE VALUE: 362.5 Ohm
MDC IDC MSMT LEADCHNL RV PACING THRESHOLD AMPLITUDE: 0.75 V
MDC IDC MSMT LEADCHNL RV PACING THRESHOLD PULSEWIDTH: 0.4 ms
MDC IDC MSMT LEADCHNL RV SENSING INTR AMPL: 7.9 mV
MDC IDC SET LEADCHNL RV PACING PULSEWIDTH: 0.4 ms
MDC IDC SET LEADCHNL RV SENSING SENSITIVITY: 0.5 mV
MDC IDC SET ZONE DETECTION INTERVAL: 300 ms
MDC IDC STAT BRADY RV PERCENT PACED: 0.09 %
Zone Setting Detection Interval: 260 ms

## 2013-10-25 MED ORDER — PRAVASTATIN SODIUM 40 MG PO TABS: 40.0000 mg | ORAL_TABLET | Freq: Every evening | ORAL | Status: DC

## 2013-10-25 NOTE — Patient Instructions (Addendum)
Your physician has recommended you make the following change in your medication:  1) Stop Simvastatin 2) Start Pravachol 40 mg daily  Your physician recommends that you schedule a follow-up appointment in: 3 months with device clinic  Your physician wants you to follow-up in: 1 year with Ileene Hutchinson, PAC.  You will receive a reminder letter in the mail two months in advance. If you don't receive a letter, please call our office to schedule the follow-up appointment.

## 2013-10-27 ENCOUNTER — Encounter: Payer: Self-pay | Admitting: Internal Medicine

## 2013-10-27 NOTE — Progress Notes (Signed)
HPI [name] returns today for followup. She is a very pleasant 68 year old woman, with coronary disease, mitral regurgitation, chronic systolic heart failure, status post ventricular fibrillation arrest. She is status post ICD implantation. Several weeks ago, she underwent 2-D echo which demonstrated severe mitral regurgitation and mild left ventricular dysfunction. She does admit to dietary and medical noncompliance but has done better in the past year. Minimal peripheral edema. She denies anginal symptoms. Allergies  Allergen Reactions  . Codeine Nausea And Vomiting  . Meperidine Hcl Nausea And Vomiting     Current Outpatient Prescriptions  Medication Sig Dispense Refill  . acetaminophen (TYLENOL) 500 MG tablet Take 1,000 mg by mouth every 6 (six) hours as needed. For pain      . amiodarone (PACERONE) 200 MG tablet Take 1 tablet (200 mg total) by mouth every 12 (twelve) hours.  60 tablet  1  . aspirin EC 81 MG EC tablet Take 1 tablet (81 mg total) by mouth daily.      . diazepam (VALIUM) 2 MG tablet Take 2 mg by mouth 2 (two) times daily. For anxiety      . fish oil-omega-3 fatty acids 1000 MG capsule Take 4 capsules by mouth daily.       Marland Kitchen levothyroxine (SYNTHROID, LEVOTHROID) 100 MCG tablet Take 1 tablet (100 mcg total) by mouth daily before breakfast.  30 tablet  1  . lisinopril (PRINIVIL,ZESTRIL) 20 MG tablet Take 20 mg by mouth daily.      . metoprolol succinate (TOPROL-XL) 50 MG 24 hr tablet Take 50 mg by mouth daily. Take with or immediately following a meal.      . pantoprazole (PROTONIX) 40 MG tablet Take 40 mg by mouth daily.      Marland Kitchen PARoxetine (PAXIL) 30 MG tablet Take 60 mg by mouth daily.       . Polyvinyl Alcohol-Povidone (MURINE TEARS FOR DRY EYES OP) Place 1 drop into both eyes as needed. For dry eyes      . pravastatin (PRAVACHOL) 40 MG tablet Take 1 tablet (40 mg total) by mouth every evening.  30 tablet  11   No current facility-administered medications for this visit.      Past Medical History  Diagnosis Date  . Arthritis   . Hypertension   . Paroxysmal atrial fibrillation   . HLD (hyperlipidemia)   . Hypothyroidism   . Depression   . Pacemaker     single chamber ICD  . CHF (congestive heart failure) 05/2013    class III, severe  . GERD (gastroesophageal reflux disease)   . Headache(784.0)   . Severe mitral regurgitation 05/2013    s/p MV repair with annuloplasty    ROS:   All systems reviewed and negative except as noted in the HPI.   Past Surgical History  Procedure Laterality Date  . Cardiac defibrillator placement  2010    Vfib arrest, single chamber ICD  . Abdominal hysterectomy    . Hernia repair  as child    x 2  . Tee without cardioversion  09/24/2012    Procedure: TRANSESOPHAGEAL ECHOCARDIOGRAM (TEE);  Surgeon: Fay Records, MD;  Location: [facility] ENDOSCOPY;  Service: Cardiovascular;  Laterality: N/A;  . Multiple extractions with alveoloplasty N/A 05/21/2013    Procedure: Extraction of tooth #'s 2,4,18 with alveoloplasty and gross debridement of remaining teeth;  Surgeon: Lenn Cal, DDS;  Location: [place];  Service: Oral Surgery;  Laterality: N/A;  . Mitral valve repair N/A 05/24/2013  Surgeon: Ivin Poot, MD; Service: Open Heart Surgery  . Intraoperative transesophageal echocardiogram N/A 05/24/2013    Procedure: INTRAOPERATIVE TRANSESOPHAGEAL ECHOCARDIOGRAM;  Surgeon: Ivin Poot, MD;  Location: [place];  Service: Open Heart Surgery;  Laterality: N/A;     No family history on file.   History   Social History  . Marital Status: Widowed    Spouse Name: N/A    Number of Children: N/A  . Years of Education: N/A   Occupational History  . Not on file.   Social History Main Topics  . Smoking status: Never Smoker   . Smokeless tobacco: Never Used  . Alcohol Use: No  . Drug Use: No  . Sexual Activity: Not on file   Other Topics Concern  . Not on file   Social History Narrative  . No narrative on file      BP 158/92  Pulse 55  Ht 5\' 4"  (1.626 m)  Wt 156 lb 12.8 oz (71.124 kg)  BMI 26.90 kg/m2  Physical Exam:  Well appearing NAD HEENT: Unremarkable Neck: 7 cm JVD, no thyromegally Lungs:  Clear except for basilar rales. No wheezes or rhonchi. Well-healed ICD incision. HEART:  Regular rate rhythm, grade 3/6 systolic murmur, no rubs, no clicks Abd:  soft, positive bowel sounds, no organomegally, no rebound, no guarding Ext:  2 plus pulses, no edema, no cyanosis, no clubbing Skin:  No rashes no nodules Neuro:  CN II through XII intact, motor grossly intact  DEVICE  Normal device function.  See PaceArt for details.   Assess/Plan:

## 2013-10-27 NOTE — Assessment & Plan Note (Signed)
Her symptoms are class 2B. She is encouraged to maintain her current meds and maintain a low sodium diet.

## 2013-10-27 NOTE — Assessment & Plan Note (Signed)
She is maintaining NSR. She will continue amiodarone.

## 2013-10-27 NOTE — Assessment & Plan Note (Signed)
Her [employer] ICD is working normally. Will recheck in several months

## 2013-10-27 NOTE — Assessment & Plan Note (Signed)
Her Simvastatin has been discontinued as the patient is also on amiodarone and I am concerned that the combination will lead to liver toxicity. Will start Pravastatin.

## 2014-01-23 ENCOUNTER — Ambulatory Visit (INDEPENDENT_AMBULATORY_CARE_PROVIDER_SITE_OTHER): Payer: Medicare Other | Admitting: *Deleted

## 2014-01-23 DIAGNOSIS — I428 Other cardiomyopathies: Secondary | ICD-10-CM

## 2014-01-23 DIAGNOSIS — I10 Essential (primary) hypertension: Secondary | ICD-10-CM | POA: Diagnosis not present

## 2014-01-23 DIAGNOSIS — E7800 Pure hypercholesterolemia, unspecified: Secondary | ICD-10-CM | POA: Diagnosis not present

## 2014-01-23 LAB — MDC_IDC_ENUM_SESS_TYPE_INCLINIC
HIGH POWER IMPEDANCE MEASURED VALUE: 38.2098
Implantable Pulse Generator Serial Number: 745406
Lead Channel Pacing Threshold Amplitude: 1.25 V
Lead Channel Pacing Threshold Amplitude: 1.25 V
Lead Channel Pacing Threshold Pulse Width: 0.4 ms
Lead Channel Sensing Intrinsic Amplitude: 7.3 mV
Lead Channel Setting Pacing Amplitude: 2.5 V
Lead Channel Setting Pacing Pulse Width: 0.4 ms
MDC IDC MSMT BATTERY REMAINING LONGEVITY: 79.2 mo
MDC IDC MSMT LEADCHNL RV IMPEDANCE VALUE: 350 Ohm
MDC IDC MSMT LEADCHNL RV PACING THRESHOLD PULSEWIDTH: 0.4 ms
MDC IDC SESS DTM: 20150409161838
MDC IDC SET LEADCHNL RV SENSING SENSITIVITY: 0.5 mV
MDC IDC SET ZONE DETECTION INTERVAL: 260 ms
MDC IDC SET ZONE DETECTION INTERVAL: 300 ms
MDC IDC STAT BRADY RV PERCENT PACED: 0.01 %

## 2014-01-23 NOTE — Progress Notes (Signed)
ICD check in clinic. Normal device function. Thresholds and sensing consistent with previous device measurements. Impedance trends stable over time. No evidence of any ventricular arrhythmias.  Histogram distribution appropriate for patient and level of activity. No changes made this session. Device programmed at appropriate safety margins. Device programmed to optimize intrinsic conduction. Estimated longevity 6.3 years.  Patient education completed including shock plan. Alert tones/vibration demonstrated for patient.  ROV in July with the device clinic.

## 2014-01-31 DIAGNOSIS — I428 Other cardiomyopathies: Secondary | ICD-10-CM | POA: Diagnosis not present

## 2014-01-31 DIAGNOSIS — E7800 Pure hypercholesterolemia, unspecified: Secondary | ICD-10-CM | POA: Diagnosis not present

## 2014-01-31 DIAGNOSIS — I10 Essential (primary) hypertension: Secondary | ICD-10-CM | POA: Diagnosis not present

## 2014-02-13 ENCOUNTER — Encounter: Payer: Self-pay | Admitting: Internal Medicine

## 2014-02-22 ENCOUNTER — Encounter (HOSPITAL_COMMUNITY): Payer: Self-pay | Admitting: Emergency Medicine

## 2014-02-22 ENCOUNTER — Emergency Department (HOSPITAL_COMMUNITY)
Admission: EM | Admit: 2014-02-22 | Discharge: 2014-02-23 | Disposition: A | Discharge status: Home / Self Care | Payer: Medicare Other | Attending: Emergency Medicine | Admitting: Emergency Medicine

## 2014-02-22 DIAGNOSIS — Z950 Presence of cardiac pacemaker: Secondary | ICD-10-CM | POA: Diagnosis not present

## 2014-02-22 DIAGNOSIS — M129 Arthropathy, unspecified: Secondary | ICD-10-CM | POA: Insufficient documentation

## 2014-02-22 DIAGNOSIS — S0003XA Contusion of scalp, initial encounter: Secondary | ICD-10-CM | POA: Insufficient documentation

## 2014-02-22 DIAGNOSIS — E785 Hyperlipidemia, unspecified: Secondary | ICD-10-CM | POA: Diagnosis not present

## 2014-02-22 DIAGNOSIS — E039 Hypothyroidism, unspecified: Secondary | ICD-10-CM | POA: Diagnosis not present

## 2014-02-22 DIAGNOSIS — S62101A Fracture of unspecified carpal bone, right wrist, initial encounter for closed fracture: Secondary | ICD-10-CM

## 2014-02-22 DIAGNOSIS — S1093XA Contusion of unspecified part of neck, initial encounter: Secondary | ICD-10-CM

## 2014-02-22 DIAGNOSIS — S0100XA Unspecified open wound of scalp, initial encounter: Secondary | ICD-10-CM | POA: Diagnosis not present

## 2014-02-22 DIAGNOSIS — S0191XA Laceration without foreign body of unspecified part of head, initial encounter: Secondary | ICD-10-CM

## 2014-02-22 DIAGNOSIS — F329 Major depressive disorder, single episode, unspecified: Secondary | ICD-10-CM | POA: Diagnosis not present

## 2014-02-22 DIAGNOSIS — W010XXA Fall on same level from slipping, tripping and stumbling without subsequent striking against object, initial encounter: Secondary | ICD-10-CM | POA: Insufficient documentation

## 2014-02-22 DIAGNOSIS — Z79899 Other long term (current) drug therapy: Secondary | ICD-10-CM | POA: Diagnosis not present

## 2014-02-22 DIAGNOSIS — I10 Essential (primary) hypertension: Secondary | ICD-10-CM | POA: Insufficient documentation

## 2014-02-22 DIAGNOSIS — I509 Heart failure, unspecified: Secondary | ICD-10-CM | POA: Diagnosis not present

## 2014-02-22 DIAGNOSIS — Y929 Unspecified place or not applicable: Secondary | ICD-10-CM | POA: Insufficient documentation

## 2014-02-22 DIAGNOSIS — I4891 Unspecified atrial fibrillation: Secondary | ICD-10-CM | POA: Diagnosis not present

## 2014-02-22 DIAGNOSIS — F3289 Other specified depressive episodes: Secondary | ICD-10-CM | POA: Insufficient documentation

## 2014-02-22 DIAGNOSIS — S0180XA Unspecified open wound of other part of head, initial encounter: Secondary | ICD-10-CM | POA: Insufficient documentation

## 2014-02-22 DIAGNOSIS — T148XXA Other injury of unspecified body region, initial encounter: Secondary | ICD-10-CM

## 2014-02-22 DIAGNOSIS — K219 Gastro-esophageal reflux disease without esophagitis: Secondary | ICD-10-CM | POA: Insufficient documentation

## 2014-02-22 DIAGNOSIS — S59909A Unspecified injury of unspecified elbow, initial encounter: Secondary | ICD-10-CM | POA: Diagnosis not present

## 2014-02-22 DIAGNOSIS — S52509A Unspecified fracture of the lower end of unspecified radius, initial encounter for closed fracture: Secondary | ICD-10-CM | POA: Diagnosis not present

## 2014-02-22 DIAGNOSIS — W108XXA Fall (on) (from) other stairs and steps, initial encounter: Secondary | ICD-10-CM | POA: Insufficient documentation

## 2014-02-22 DIAGNOSIS — W19XXXA Unspecified fall, initial encounter: Secondary | ICD-10-CM

## 2014-02-22 DIAGNOSIS — M25529 Pain in unspecified elbow: Secondary | ICD-10-CM | POA: Diagnosis not present

## 2014-02-22 DIAGNOSIS — Y9389 Activity, other specified: Secondary | ICD-10-CM | POA: Insufficient documentation

## 2014-02-22 DIAGNOSIS — T1490XA Injury, unspecified, initial encounter: Secondary | ICD-10-CM | POA: Diagnosis not present

## 2014-02-22 DIAGNOSIS — I059 Rheumatic mitral valve disease, unspecified: Secondary | ICD-10-CM | POA: Diagnosis not present

## 2014-02-22 DIAGNOSIS — S0083XA Contusion of other part of head, initial encounter: Secondary | ICD-10-CM | POA: Insufficient documentation

## 2014-02-22 DIAGNOSIS — S52609A Unspecified fracture of lower end of unspecified ulna, initial encounter for closed fracture: Principal | ICD-10-CM

## 2014-02-22 NOTE — ED Notes (Signed)
Pt tripped and fell down some steps.  Pt has 2-3inch lac (per ems) to right forehead with dressing in place.  Pt also has obvious deformity to right wrist.  Pt had 85mcg fentanyl IVP from ems.  Pt has 20g IV in RAC.  No LOC, no neck or back pain.  Pt is not on blood thinners

## 2014-02-23 ENCOUNTER — Emergency Department (HOSPITAL_COMMUNITY): Payer: Medicare Other

## 2014-02-23 DIAGNOSIS — S0100XA Unspecified open wound of scalp, initial encounter: Secondary | ICD-10-CM | POA: Diagnosis not present

## 2014-02-23 DIAGNOSIS — M25529 Pain in unspecified elbow: Secondary | ICD-10-CM | POA: Diagnosis not present

## 2014-02-23 DIAGNOSIS — S59909A Unspecified injury of unspecified elbow, initial encounter: Secondary | ICD-10-CM | POA: Diagnosis not present

## 2014-02-23 DIAGNOSIS — S52509A Unspecified fracture of the lower end of unspecified radius, initial encounter for closed fracture: Secondary | ICD-10-CM | POA: Diagnosis not present

## 2014-02-23 LAB — CBC WITH DIFFERENTIAL/PLATELET
BASOS ABS: 0 10*3/uL (ref 0.0–0.1)
Basophils Relative: 0 % (ref 0–1)
Eosinophils Absolute: 0.2 10*3/uL (ref 0.0–0.7)
Eosinophils Relative: 2 % (ref 0–5)
HCT: 29.4 % — ABNORMAL LOW (ref 36.0–46.0)
Hemoglobin: 9.6 g/dL — ABNORMAL LOW (ref 12.0–15.0)
LYMPHS ABS: 1.1 10*3/uL (ref 0.7–4.0)
LYMPHS PCT: 16 % (ref 12–46)
MCH: 32.3 pg (ref 26.0–34.0)
MCHC: 32.7 g/dL (ref 30.0–36.0)
MCV: 99 fL (ref 78.0–100.0)
Monocytes Absolute: 0.3 10*3/uL (ref 0.1–1.0)
Monocytes Relative: 5 % (ref 3–12)
NEUTROS ABS: 5.2 10*3/uL (ref 1.7–7.7)
NEUTROS PCT: 77 % (ref 43–77)
PLATELETS: 248 10*3/uL (ref 150–400)
RBC: 2.97 MIL/uL — AB (ref 3.87–5.11)
RDW: 13.4 % (ref 11.5–15.5)
WBC: 6.8 10*3/uL (ref 4.0–10.5)

## 2014-02-23 LAB — BASIC METABOLIC PANEL
BUN: 22 mg/dL (ref 6–23)
CALCIUM: 9 mg/dL (ref 8.4–10.5)
CHLORIDE: 98 meq/L (ref 96–112)
CO2: 24 meq/L (ref 19–32)
Creatinine, Ser: 1.09 mg/dL (ref 0.50–1.10)
GFR calc Af Amer: 60 mL/min — ABNORMAL LOW (ref >90)
GFR calc non Af Amer: 52 mL/min — ABNORMAL LOW (ref >90)
GLUCOSE: 108 mg/dL — AB (ref 70–99)
POTASSIUM: 4.6 meq/L (ref 3.7–5.3)
SODIUM: 135 meq/L — AB (ref 137–147)

## 2014-02-23 LAB — TYPE AND SCREEN
ABO/RH(D): O POS
ANTIBODY SCREEN: NEGATIVE

## 2014-02-23 LAB — PROTIME-INR
INR: 0.97 (ref 0.00–1.49)
Prothrombin Time: 12.7 seconds (ref 11.6–15.2)

## 2014-02-23 MED ORDER — HYDROCODONE-ACETAMINOPHEN 5-325 MG PO TABS
1.0000 | ORAL_TABLET | Freq: Once | ORAL | Status: AC
  Administered 2014-02-23: 1 via ORAL
  Filled 2014-02-23: qty 1

## 2014-02-23 MED ORDER — HYDROCODONE-ACETAMINOPHEN 5-325 MG PO TABS: 1.0000 | ORAL_TABLET | Freq: Four times a day (QID) | ORAL | Status: DC | PRN

## 2014-02-23 MED ORDER — FENTANYL CITRATE 0.05 MG/ML IJ SOLN
50.0000 ug | Freq: Once | INTRAMUSCULAR | Status: AC
  Administered 2014-02-23: 50 ug via INTRAVENOUS
  Filled 2014-02-23: qty 2

## 2014-02-23 MED ORDER — IBUPROFEN 400 MG PO TABS: 400.0000 mg | ORAL_TABLET | Freq: Four times a day (QID) | ORAL | Status: DC | PRN

## 2014-02-23 NOTE — ED Notes (Signed)
Ortho called for splinting  

## 2014-02-23 NOTE — ED Notes (Signed)
MD at bedside. 

## 2014-02-23 NOTE — ED Provider Notes (Signed)
CSN: [id number]     Arrival date & time 02/22/14  2334 History   First MD Initiated Contact with Patient 02/23/14 0029     Chief Complaint  Patient presents with  . Fall     (Consider location/radiation/quality/duration/timing/severity/associated sxs/prior Treatment) HPI Comments: 68 y/o F with hx of CHF, afib, not on anticoagulants comes n post mechanical fall. Pt fell down a flight of stairs, concrete surface. She has head bleed and right wrist pain. Pt denies any pain elsewhere. Per EMS, large lac to the forehead, with extensive bleeding.  Pt not on any anticoagulants.  Patient is a 68 y.o. female presenting with fall. The history is provided by the patient.  Fall Pertinent negatives include no chest pain, no abdominal pain, no headaches and no shortness of breath.    Past Medical History  Diagnosis Date  . Arthritis   . Hypertension   . Paroxysmal atrial fibrillation   . HLD (hyperlipidemia)   . Hypothyroidism   . Depression   . Pacemaker     single chamber ICD  . CHF (congestive heart failure) 05/2013    class III, severe  . GERD (gastroesophageal reflux disease)   . Headache(784.0)   . Severe mitral regurgitation 05/2013    s/p MV repair with annuloplasty   Past Surgical History  Procedure Laterality Date  . Cardiac defibrillator placement  2010    Vfib arrest, single chamber ICD  . Abdominal hysterectomy    . Hernia repair  as child    x 2  . Tee without cardioversion  09/24/2012    Procedure: TRANSESOPHAGEAL ECHOCARDIOGRAM (TEE);  Surgeon: Fay Records, MD;  Location: [facility];  Service: Cardiovascular;  Laterality: N/A;  . Multiple extractions with alveoloplasty N/A 05/21/2013    Procedure: Extraction of tooth #'s 2,4,18 with alveoloplasty and gross debridement of remaining teeth;  Surgeon: Lenn Cal, DDS;  Location: [facility];  Service: Oral Surgery;  Laterality: N/A;  . Mitral valve repair N/A 05/24/2013    Surgeon: Ivin Poot, MD; Service: Open Heart  Surgery  . Intraoperative transesophageal echocardiogram N/A 05/24/2013    Procedure: INTRAOPERATIVE TRANSESOPHAGEAL ECHOCARDIOGRAM;  Surgeon: Ivin Poot, MD;  Location: [facility];  Service: Open Heart Surgery;  Laterality: N/A;   No family history on file. History  Substance Use Topics  . Smoking status: Never Smoker   . Smokeless tobacco: Never Used  . Alcohol Use: No   OB History   Grav Para Term Preterm Abortions TAB SAB Ect Mult Living                 Review of Systems  Constitutional: Negative for activity change.  Respiratory: Negative for shortness of breath.   Cardiovascular: Negative for chest pain.  Gastrointestinal: Negative for nausea, vomiting and abdominal pain.  Genitourinary: Negative for dysuria.  Musculoskeletal: Positive for arthralgias and myalgias. Negative for neck pain.  Skin: Positive for wound.  Neurological: Negative for headaches.  Hematological: Does not bruise/bleed easily.  All other systems reviewed and are negative.     Allergies  Codeine and Meperidine hcl  Home Medications   Prior to Admission medications   Medication Sig Start Date End Date Taking? Authorizing Provider  acetaminophen (TYLENOL) 500 MG tablet Take 1,000 mg by mouth every 6 (six) hours as needed. For pain   Yes Historical Provider, MD  amiodarone (PACERONE) 200 MG tablet Take 1 tablet (200 mg total) by mouth every 12 (twelve) hours. 05/31/13  Yes Coolidge Breeze, PA-C  aspirin EC 81 MG tablet Take 81 mg by mouth every other day.   Yes Historical Provider, MD  aspirin-acetaminophen-caffeine (EXCEDRIN MIGRAINE) [phone] MG per tablet Take 1 tablet by mouth every 6 (six) hours as needed for headache.   Yes Historical Provider, MD  diazepam (VALIUM) 2 MG tablet Take 2 mg by mouth 2 (two) times daily as needed (for migraines).   Yes Historical Provider, MD  fish oil-omega-3 fatty acids 1000 MG capsule Take 4 capsules by mouth daily.    Yes Historical Provider, MD  levothyroxine  (SYNTHROID, LEVOTHROID) 100 MCG tablet Take 1 tablet (100 mcg total) by mouth daily before breakfast. 05/31/13  Yes Gina L Collins, PA-C  lisinopril (PRINIVIL,ZESTRIL) 20 MG tablet Take 20 mg by mouth daily.   Yes Historical Provider, MD  metoprolol succinate (TOPROL-XL) 50 MG 24 hr tablet Take 50 mg by mouth daily. Take with or immediately following a meal.   Yes Historical Provider, MD  oxyCODONE-acetaminophen (PERCOCET/ROXICET) 5-325 MG per tablet Take 1 tablet by mouth every 4 (four) hours as needed for severe pain.   Yes Historical Provider, MD  pantoprazole (PROTONIX) 40 MG tablet Take 40 mg by mouth daily.   Yes Historical Provider, MD  PARoxetine (PAXIL) 30 MG tablet Take 60 mg by mouth daily.    Yes Historical Provider, MD  Polyvinyl Alcohol-Povidone (MURINE TEARS FOR DRY EYES OP) Place 1 drop into both eyes as needed. For dry eyes   Yes Historical Provider, MD  pravastatin (PRAVACHOL) 40 MG tablet Take 1 tablet (40 mg total) by mouth every evening. 10/25/13  Yes Evans Lance, MD   BP 107/54  Pulse 50  Temp(Src) 98.4 F (36.9 C) (Oral)  Resp 18  SpO2 96% Physical Exam  Nursing note and vitals reviewed. Constitutional: She is oriented to person, place, and time. She appears well-developed and well-nourished.  HENT:  Head: Normocephalic and atraumatic.  Arterial bleed from the right forehead, with a 3 cm laceration  Eyes: Conjunctivae and EOM are normal. Pupils are equal, round, and reactive to light.  Neck: Normal range of motion. Neck supple.  Cardiovascular: Normal rate, regular rhythm, normal heart sounds and intact distal pulses.   No murmur heard. Pulmonary/Chest: Effort normal. No respiratory distress. She has no wheezes.  Abdominal: Soft. Bowel sounds are normal. She exhibits no distension. There is no tenderness. There is no rebound and no guarding.  Genitourinary:     Musculoskeletal:  Pt has right wrist deformity and tenderness. Also has heaf laceration and hematoma.  OTHERWISE: Head to toe evaluation shows no bleeding of the scalp, no facial abrasions, step offs, crepitus, no tenderness to palpation of the bilateral upper and lower extremities, no gross deformities, no chest tenderness, no pelvic pain.   Neurological: She is alert and oriented to person, place, and time.  Skin: Skin is warm and dry.    ED Course  Procedures (including critical care time) Labs Review Labs Reviewed  CBC WITH DIFFERENTIAL - Abnormal; Notable for the following:    RBC 2.97 (*)    Hemoglobin 9.6 (*)    HCT 29.4 (*)    All other components within normal limits  BASIC METABOLIC PANEL - Abnormal; Notable for the following:    Sodium 135 (*)    Glucose, Bld 108 (*)    GFR calc non Af Amer 52 (*)    GFR calc Af Amer 60 (*)    All other components within normal limits  PROTIME-INR  TYPE AND SCREEN  Imaging Review Dg Elbow Complete Right  02/23/2014   CLINICAL DATA:  Fall, wrist fracture, elbow pain  EXAM: RIGHT ELBOW - COMPLETE 3+ VIEW  COMPARISON:  DG WRIST COMPLETE*R* dated 02/23/2014  FINDINGS: The study is moderately limited by the presence of the cast as well as by related limited positioning. There is no evidence of dislocation or joint effusion at the with joint. No displaced fractures identified. Mild irregularity of the radial neck may be positional. No evidence of humeral fracture. Proximal ulna not well evaluated.  IMPRESSION: Study is limited for multiple reasons as described above. No evidence of distal humeral fracture. Radius and ulna not well evaluated. No displaced fractures or elbow dislocation.   Electronically Signed   By: Skipper Cliche M.D.   On: 02/23/2014 07:43   Dg Wrist Complete Right  02/23/2014   CLINICAL DATA:  Fall with wrist deformity  EXAM: RIGHT WRIST - COMPLETE 3+ VIEW  COMPARISON:  None.  FINDINGS: Acute transverse fracture through the distal radius, likely with intra-articular extension along the lateral articular surface. No visible  offset along the articular contour. There is dorsal impaction causing dorsal tilting of the wrist. Acquired ulnar positive variance. Ulnar styloid process avulsion fracture. Normally aligned carpus. Osteopenia.  IMPRESSION: Acute distal radius and ulna fractures as above.   Electronically Signed   By: Jorje Guild M.D.   On: 02/23/2014 01:28   Ct Head Wo Contrast  02/23/2014   CLINICAL DATA:  Fall.  Forehead laceration  EXAM: CT HEAD WITHOUT CONTRAST  TECHNIQUE: Contiguous axial images were obtained from the base of the skull through the vertex without intravenous contrast.  COMPARISON:  06/28/2008  FINDINGS: Reformat for anatomical positioning is currently not possible due to scanner limitations.  Skull and Sinuses:No acute fracture. No sinus or mastoid opacification.  Orbits: No acute abnormality.  Brain: No evidence of acute abnormality, such as acute infarction, hemorrhage, hydrocephalus, or mass lesion/mass effect.  IMPRESSION: No evidence of acute intracranial injury.   Electronically Signed   By: Jorje Guild M.D.   On: 02/23/2014 04:05     EKG Interpretation None      MDM   Final diagnoses:  Contusion  Wrist fracture, right  Laceration of head  Fall    Pt comes in with cc of fall. Lac repaired, it was an arterial bleed of the forehead.  Pt has a wrist fracture - Hand f/u given. Neurovascularly intact. Pt ambulated - did well with that. Stable for discharge.  SPLINT APPLICATION Date/Time: 123456 AM Authorized by: Tanja Gift Consent: Verbal consent obtained. Risks and benefits: risks, benefits and alternatives were discussed Consent given by: patient Splint applied by: orthopedic technician Location details: right arm Splint type: sugar tong Post-procedure: The splinted body part was neurovascularly unchanged following the procedure. Patient tolerance: Patient tolerated the procedure well with no immediate complications.   LACERATION REPAIR Performed by: Mads Borgmeyer Authorized by: Arrianna Catala Consent: Verbal consent obtained. Risks and benefits: risks, benefits and alternatives were discussed Consent given by: patient Patient identity confirmed: provided demographic data Prepped and Draped in normal sterile fashion Wound explored  Laceration Location: right forehead  Laceration Length: 3 cm  No Foreign Bodies seen or palpated  Anesthesia: local infiltration  Local anesthetic: lidocaine 1% with epinephrine  Anesthetic total: 2 ml   Irrigation method: syringe Amount of cleaning: standard  Skin closure: primary  Number of sutures: 4, 5-0 nylon  Technique: simple inturrupted   Patient tolerance: Patient tolerated the procedure well  with no immediate complications.    Varney Biles, MD 02/23/14 [phone]

## 2014-02-23 NOTE — ED Notes (Signed)
Pt left VM for son to pick her up.

## 2014-02-23 NOTE — ED Notes (Signed)
Pt's Lac begin bleeding again; MD notified; Md at bedside placing stitch

## 2014-02-23 NOTE — ED Notes (Signed)
Phlebotomy in room drawing blood.  Bleeding continues

## 2014-02-23 NOTE — ED Notes (Signed)
Pt daughter called and stated she would be here around 930 to pick pt up.

## 2014-02-23 NOTE — ED Notes (Signed)
Patient transported to X-ray 

## 2014-02-23 NOTE — ED Notes (Signed)
Pt washed off with warm washcloth and new gown given, also new sheet and chuck behind her.  Pt has clotted blood in her hair which i attempted to wash out without success at this time.

## 2014-02-23 NOTE — ED Notes (Signed)
Patient transported to CT 

## 2014-02-23 NOTE — ED Notes (Addendum)
NT at bedside w/Clotting Gauze applied w/ pressure to right side head Lac; pt denies dizziness at this time; Neuro intact

## 2014-02-23 NOTE — Progress Notes (Signed)
Orthopedic Tech Progress Note Patient Details:  [name] [birth year] [id number]  Ortho Devices Type of Ortho Device: Arm sling;Sugartong splint   Katheren Shams 02/23/2014, 5:42 AM

## 2014-02-23 NOTE — Discharge Instructions (Signed)
Please see the Hand surgeon as requested. You will need your primary care doctor to remove the stitches to your forehead in 7 days,   Fall Prevention and [place] cause injuries and can affect all age groups. It is possible to use preventive measures to significantly decrease the likelihood of falls. There are many simple measures which can make your home safer and prevent falls. OUTDOORS  Repair cracks and edges of walkways and driveways.  Remove high doorway thresholds.  Trim shrubbery on the main path into your home.  Have good outside lighting.  Clear walkways of tools, rocks, debris, and clutter.  Check that handrails are not broken and are securely fastened. Both sides of steps should have handrails.  Have leaves, snow, and ice cleared regularly.  Use sand or salt on walkways during winter months.  In the garage, clean up grease or oil spills. BATHROOM  Install night lights.  Install grab bars by the toilet and in the tub and shower.  Use non-skid mats or decals in the tub or shower.  Place a plastic non-slip stool in the shower to sit on, if needed.  Keep floors dry and clean up all water on the floor immediately.  Remove soap buildup in the tub or shower on a regular basis.  Secure bath mats with non-slip, double-sided rug tape.  Remove throw rugs and tripping hazards from the floors. BEDROOMS  Install night lights.  Make sure a bedside light is easy to reach.  Do not use oversized bedding.  Keep a telephone by your bedside.  Have a firm chair with side arms to use for getting dressed.  Remove throw rugs and tripping hazards from the floor. KITCHEN  Keep handles on pots and pans turned toward the center of the stove. Use back burners when possible.  Clean up spills quickly and allow time for drying.  Avoid walking on wet floors.  Avoid hot utensils and knives.  Position shelves so they are not too high or low.  Place commonly used  objects within easy reach.  If necessary, use a sturdy step stool with a grab bar when reaching.  Keep electrical cables out of the way.  Do not use floor polish or wax that makes floors slippery. If you must use wax, use non-skid floor wax.  Remove throw rugs and tripping hazards from the floor. STAIRWAYS  Never leave objects on stairs.  Place handrails on both sides of stairways and use them. Fix any loose handrails. Make sure handrails on both sides of the stairways are as long as the stairs.  Check carpeting to make sure it is firmly attached along stairs. Make repairs to worn or loose carpet promptly.  Avoid placing throw rugs at the top or bottom of stairways, or properly secure the rug with carpet tape to prevent slippage. Get rid of throw rugs, if possible.  Have an electrician put in a light switch at the top and bottom of the stairs. OTHER FALL PREVENTION TIPS  Wear low-heel or rubber-soled shoes that are supportive and fit well. Wear closed toe shoes.  When using a stepladder, make sure it is fully opened and both spreaders are firmly locked. Do not climb a closed stepladder.  Add color or contrast paint or tape to grab bars and handrails in your home. Place contrasting color strips on first and last steps.  Learn and use mobility aids as needed. Install an electrical emergency response system.  Turn on lights to avoid dark  areas. Replace light bulbs that burn out immediately. Get light switches that glow.  Arrange furniture to create clear pathways. Keep furniture in the same place.  Firmly attach carpet with non-skid or double-sided tape.  Eliminate uneven floor surfaces.  Select a carpet pattern that does not visually hide the edge of steps.  Be aware of all pets. OTHER HOME SAFETY TIPS  Set the water temperature for 120 F (48.8 C).  Keep emergency numbers on or near the telephone.  Keep smoke detectors on every level of the home and near sleeping  areas. Document Released: 09/23/2002 Document Revised: 04/03/2012 Document Reviewed: 12/23/2011 [facility] Patient Information 2014 [place].  Wrist Fracture A wrist fracture is a break in one of the bones of the wrist. Your wrist is made up of several small bones at the palm of your hand (carpal bones) and the two bones that make up your forearm (radius and ulna). The bones come together to form multiple large and small joints. The shape and design of these joints allow your wrist to bend and straighten, move side-to-side, and rotate, as in twisting your palm up or down. CAUSES  A fracture may occur in any of the bones in your wrist when enough force is applied to the wrist, such as when falling down onto an outstretched hand. Severe injuries may occur from a more forceful injury. SYMPTOMS Symptoms of wrist fractures include tenderness, bruising, and swelling. Additionally, the wrist may hang in an odd position or may be misshaped. DIAGNOSIS To diagnose a wrist fracture, your caregiver will physically examine your wrist. Your caregiver may also request an X-ray exam of your wrist. TREATMENT Treatment depends on many factors, including the nature and location of the fracture, your age, and your activity level. Treatment for wrist fracture can be nonsurgical or surgical. For nonsurgical treatment, a plaster cast or splint may be applied to your wrist if the bone is in a good position (aligned). The cast will stay on for about 6 weeks. If the alignment of your bone is not good, it may be necessary to realign (reduce) it. After the bone is reduced, a splint usually is placed on your wrist to allow for a small amount of normal swelling. After about 1 week, the splint is removed and a cast is added. The cast is removed 2 or 3 weeks later, after the swelling goes down, causing the cast to loosen. Another cast is applied. This cast is removed after about another 2 or 3 weeks, for a total of 4 to 6 weeks  of immobilization. Sometimes the position of the bone is so far out of place that surgery is required to apply a device to hold it together as it heals. If the bone cannot be reduced without cutting the skin around the bone (closed reduction), a cut (incision) is made to allow direct access to the bone to reduce it (open reduction). Depending on the fracture, there are a number of options for holding the bone in place while it heals, including a cast, metal pins, a plate and screws, and a device called an external fixator. With an external fixator, most of the hardware remains outside of the body. HOME CARE INSTRUCTIONS  To lessen swelling, keep your injured wrist elevated and move your fingers as much as possible.  Apply ice to your wrist for the first 1 to 2 days after you have been treated or as directed by your caregiver. Applying ice helps to reduce inflammation and pain.  Put ice in a plastic bag.  Place a towel between your skin and the bag.  Leave the ice on for 15 to 20 minutes at a time, every 2 hours while you are awake.  Do not put pressure on any part of your cast or splint. It may break.  Use a plastic bag to protect your cast or splint from water while bathing or showering. Do not lower your cast or splint into water.  Only take over-the-counter or prescription medicines for pain as directed by your caregiver. SEEK IMMEDIATE MEDICAL CARE IF:   Your cast or splint gets damaged or breaks.  You have continued severe pain or more swelling than you did before the cast was put on.  Your skin or fingernails below the injury turn blue or gray or feel cold or numb.  You develop decreased feeling in your fingers. MAKE SURE YOU:  Understand these instructions.  Will watch your condition.  Will get help right away if you are not doing well or get worse. Document Released: 07/13/2005 Document Revised: 12/26/2011 Document Reviewed: 10/21/2011 [facility] Patient Information 2014  [place], [place]. Laceration Care, Adult A laceration is a cut that goes through all layers of the skin. The cut goes into the tissue beneath the skin. HOME CARE For stitches (sutures) or staples:  Keep the cut clean and dry.  If you have a bandage (dressing), change it at least once a day. Change the bandage if it gets wet or dirty, or as told by your doctor.  Wash the cut with soap and water 2 times a day. Rinse the cut with water. Pat it dry with a clean towel.  Put a thin layer of medicated cream on the cut as told by your doctor.  You may shower after the first 24 hours. Do not soak the cut in water until the stitches are removed.  Only take medicines as told by your doctor.  Have your stitches or staples removed as told by your doctor. For skin adhesive strips:  Keep the cut clean and dry.  Do not get the strips wet. You may take a bath, but be careful to keep the cut dry.  If the cut gets wet, pat it dry with a clean towel.  The strips will fall off on their own. Do not remove the strips that are still stuck to the cut. For wound glue:  You may shower or take baths. Do not soak or scrub the cut. Do not swim. Avoid heavy sweating until the glue falls off on its own. After a shower or bath, pat the cut dry with a clean towel.  Do not put medicine on your cut until the glue falls off.  If you have a bandage, do not put tape over the glue.  Avoid lots of sunlight or tanning lamps until the glue falls off. Put sunscreen on the cut for the first year to reduce your scar.  The glue will fall off on its own. Do not pick at the glue. You may need a tetanus shot if:  You cannot remember when you had your last tetanus shot.  You have never had a tetanus shot. If you need a tetanus shot and you choose not to have one, you may get tetanus. Sickness from tetanus can be serious. GET HELP RIGHT AWAY IF:   Your pain does not get better with medicine.  Your arm, hand, leg, or foot  loses feeling (numbness) or changes color.  Your cut  is bleeding.  Your joint feels weak, or you cannot use your joint.  You have painful lumps on your body.  Your cut is red, puffy (swollen), or painful.  You have a red line on the skin near the cut.  You have yellowish-white fluid (pus) coming from the cut.  You have a fever.  You have a bad smell coming from the cut or bandage.  Your cut breaks open before or after stitches are removed.  You notice something coming out of the cut, such as wood or glass.  You cannot move a finger or toe. MAKE SURE YOU:   Understand these instructions.  Will watch your condition.  Will get help right away if you are not doing well or get worse. Document Released: 03/21/2008 Document Revised: 12/26/2011 Document Reviewed: 03/29/2011 [facility] Patient Information 2014 [place].

## 2014-02-23 NOTE — ED Notes (Signed)
Right side head Lac bleeding has been stopped new head dressing applied by NT

## 2014-03-04 ENCOUNTER — Encounter (HOSPITAL_COMMUNITY): Payer: Self-pay | Admitting: *Deleted

## 2014-03-04 ENCOUNTER — Other Ambulatory Visit: Payer: Self-pay | Admitting: Orthopedic Surgery

## 2014-03-04 DIAGNOSIS — S52599A Other fractures of lower end of unspecified radius, initial encounter for closed fracture: Secondary | ICD-10-CM | POA: Diagnosis not present

## 2014-03-04 MED ORDER — CEFAZOLIN SODIUM-DEXTROSE 2-3 GM-% IV SOLR: 2.0000 g | INTRAVENOUS | Status: DC

## 2014-03-04 MED ORDER — CHLORHEXIDINE GLUCONATE 4 % EX LIQD
60.0000 mL | Freq: Once | CUTANEOUS | Status: DC
  Filled 2014-03-04: qty 60

## 2014-03-04 NOTE — H&P (Signed)
[name] is an 68 y.o. [id number] cc of right wrist pain and deformity s/p fall last weekend  XRs show displaced right distal radius fracture  Past Medical History  Diagnosis Date  . Arthritis   . Hypertension   . Paroxysmal atrial fibrillation   . HLD (hyperlipidemia)   . Hypothyroidism   . Depression   . Pacemaker     single chamber ICD  . CHF (congestive heart failure) 05/2013    class III, severe  . GERD (gastroesophageal reflux disease)   . Headache(784.0)   . Severe mitral regurgitation 05/2013    s/p MV repair with annuloplasty    Past Surgical History  Procedure Laterality Date  . Cardiac defibrillator placement  2010    Vfib arrest, single chamber ICD  . Abdominal hysterectomy    . Hernia repair  as child    x 2  . Tee without cardioversion  09/24/2012    Procedure: TRANSESOPHAGEAL ECHOCARDIOGRAM (TEE);  Surgeon: Fay Records, MD;  Location: [facility] ENDOSCOPY;  Service: Cardiovascular;  Laterality: N/A;  . Multiple extractions with alveoloplasty N/A 05/21/2013    Procedure: Extraction of tooth #'s 2,4,18 with alveoloplasty and gross debridement of remaining teeth;  Surgeon: Lenn Cal, DDS;  Location: [facility];  Service: Oral Surgery;  Laterality: N/A;  . Mitral valve repair N/A 05/24/2013    Surgeon: Ivin Poot, MD; Service: Open Heart Surgery  . Intraoperative transesophageal echocardiogram N/A 05/24/2013    Procedure: INTRAOPERATIVE TRANSESOPHAGEAL ECHOCARDIOGRAM;  Surgeon: Ivin Poot, MD;  Location: [facility];  Service: Open Heart Surgery;  Laterality: N/A;    No family history on file. Social History:  reports that she has never smoked. She has never used smokeless tobacco. She reports that she does not drink alcohol or use illicit drugs.  Allergies:  Allergies  Allergen Reactions  . Codeine Nausea And Vomiting  . Meperidine Hcl Nausea And Vomiting    No prescriptions prior to admission    No results found for this or any previous visit (from the past 48  hour(s)). No results found.  Review of Systems  All other systems reviewed and are negative.   There were no vitals taken for this visit. Physical Exam  Constitutional: She is oriented to person, place, and time. She appears well-developed and well-nourished.  HENT:  Head: Normocephalic.  Cardiovascular: Normal rate.   Respiratory: Effort normal.  Musculoskeletal:       Right wrist: She exhibits tenderness, bony tenderness and deformity.  Displaced right distal radius fracture with CTS  Neurological: She is alert and oriented to person, place, and time.  Skin: Skin is warm.  Psychiatric: She has a normal mood and affect. Her behavior is normal. Judgment and thought content normal.     Assessment/Plan As above   Plan ORIF/CTR  Schuyler Amor 03/04/2014, 1:29 PM

## 2014-03-04 NOTE — Progress Notes (Signed)
Faxed perioperative prescription form to the [facility] today @ 1:15 PM.   Pt is followed by Dr. Cristopher Peru and Dr. Terrence Dupont for her heart issues.

## 2014-03-04 NOTE — Progress Notes (Signed)
Anesthesia Chart Review:  Patient is a 68 year old female scheduled for ORIF of distal radius fracture on 03/05/14 by Dr. Burney Gauze.  She is a same day work-up for tomorrow. She fell and sustained a displaced right distal radius fracture.  She underwent teeth extraction and MV repair (triangular resection of prolapsed P2 segment with ring annuloplasty using a 28 mm Edwards Physio II ring) in 05/2013 for severe mitral regurgitation.  There was only mild CAD by cath then. She did develop post-operative afib but converted to SR on amiodarone.  Coumadin was discontinued by Dr. Prescott Gum   and Coumadin at her 06/19/13 office visit. She is s/p insertion of a single chamber St. Jude ICD for v-fib arrest in the setting of dilated cardiomyopathy on 09/09/09 (Dr. Cristopher Peru).  Her primary cardiologist is Dr. Terrence Dupont, records pending.  She last saw Dr. Lovena Le on 10/25/13 and had normal device function at that time.  Other history includes non-smoker, HTN, HLD, hypothyroidism, GERD, depression, anxiety, scoliosis.  Her pre-MR repair 2D echo on 05/15/13 showed EF 50-55%, flail posterior mitral valve leaflet with severe Elmar, moderately dilated LA, moderate TR, no atrial septal defect noted. Her post-MV repiar intra-op TEE on 05/24/13 by Dr. Chriss Driver showed: Post bypass examination now revealed the left ventricle to show a significantly reduced chamber size. Additional fluids were required to increase the left ventricular size to prebypass level contractility, however, remained good throughout. There were no evidence of segmental defects. No change in the left atrial appearance. The mitral valve especially on 3D showed reduced size of the posterior leaflet after the  wedge resection. The ring appeared to be well seated. There was good coaptation of the anterior and posterior leaflets. Color Doppler revealed only a trace of central regurgitant flow. Gradient was measured about 4 mmHg across the valve. The aortic valve was unchanged in  appearance and function, continuing to show just a wisp of central  regurgitant flow. Right heart examination was also unchanged from the prebypass examination with the right ventricle also showing good contractility.  Cardiac cath on 05/16/13 showed: FINDINGS: LV showed mild global hypokinesia, LVH, EF of 45-50%, there  was 4+ MR, left main was long which was patent, LAD has 15-20% mid stenosis, diagonal 1 has 5-10% ostial stenosis, diagonal 2 is patent, ramus is patent, left circumflex has 5-10% proximal stenosis, RCA has 5-10% proximal and mid stenosis, and PDA and PLV branches were patent.   Her last EKG on 05/25/13 showed afib, although 06/2013 noted indicate that she later converted to SR.  If she has not had a more recent EKG, then plan to repeat on arrival for a baseline.  Carotid duplex on 05/17/13 showed: bilateral 1-39% ICA stenosis. Antegrade vertebral artery flow.  Her last CXR on 06/19/13 showed: Improved bibasilar ventilation and resolved small left pleural effusion. No acute cardiopulmonary abnormality.  Labs from 02/23/14 noted.  She is scheduled for a repeat H/H on arrival to re-evaluate for anemia.    Patient is a same day work-up, so she will have to be further evaluated on the day of surgery to ensure no acute CV/CHF symptoms.  Her interviewing PAT RN has already notified the [place].   George Hugh [facility] Short Stay Center/Anesthesiology Phone [phone] 03/04/2014 4:57 PM

## 2014-03-05 ENCOUNTER — Encounter (HOSPITAL_COMMUNITY): Payer: Medicare Other | Admitting: Vascular Surgery

## 2014-03-05 ENCOUNTER — Encounter (HOSPITAL_COMMUNITY)
Admission: RE | Disposition: A | Discharge status: Home / Self Care | Payer: Self-pay | Source: Ambulatory Visit | Attending: Orthopedic Surgery

## 2014-03-05 ENCOUNTER — Ambulatory Visit (HOSPITAL_COMMUNITY)
Admission: RE | Admit: 2014-03-05 | Discharge: 2014-03-05 | Disposition: A | Discharge status: Home / Self Care | Payer: Medicare Other | Source: Ambulatory Visit | Attending: Orthopedic Surgery | Admitting: Orthopedic Surgery

## 2014-03-05 ENCOUNTER — Encounter (HOSPITAL_COMMUNITY): Payer: Self-pay | Admitting: *Deleted

## 2014-03-05 ENCOUNTER — Ambulatory Visit (HOSPITAL_COMMUNITY): Payer: Medicare Other | Admitting: Vascular Surgery

## 2014-03-05 DIAGNOSIS — Z950 Presence of cardiac pacemaker: Secondary | ICD-10-CM | POA: Diagnosis not present

## 2014-03-05 DIAGNOSIS — Z885 Allergy status to narcotic agent status: Secondary | ICD-10-CM | POA: Diagnosis not present

## 2014-03-05 DIAGNOSIS — F3289 Other specified depressive episodes: Secondary | ICD-10-CM | POA: Insufficient documentation

## 2014-03-05 DIAGNOSIS — S52599A Other fractures of lower end of unspecified radius, initial encounter for closed fracture: Secondary | ICD-10-CM | POA: Insufficient documentation

## 2014-03-05 DIAGNOSIS — Y929 Unspecified place or not applicable: Secondary | ICD-10-CM | POA: Insufficient documentation

## 2014-03-05 DIAGNOSIS — F329 Major depressive disorder, single episode, unspecified: Secondary | ICD-10-CM | POA: Insufficient documentation

## 2014-03-05 DIAGNOSIS — M129 Arthropathy, unspecified: Secondary | ICD-10-CM | POA: Insufficient documentation

## 2014-03-05 DIAGNOSIS — G8918 Other acute postprocedural pain: Secondary | ICD-10-CM | POA: Diagnosis not present

## 2014-03-05 DIAGNOSIS — F411 Generalized anxiety disorder: Secondary | ICD-10-CM | POA: Insufficient documentation

## 2014-03-05 DIAGNOSIS — I10 Essential (primary) hypertension: Secondary | ICD-10-CM | POA: Diagnosis not present

## 2014-03-05 DIAGNOSIS — I509 Heart failure, unspecified: Secondary | ICD-10-CM | POA: Diagnosis not present

## 2014-03-05 DIAGNOSIS — E785 Hyperlipidemia, unspecified: Secondary | ICD-10-CM | POA: Diagnosis not present

## 2014-03-05 DIAGNOSIS — K219 Gastro-esophageal reflux disease without esophagitis: Secondary | ICD-10-CM | POA: Diagnosis not present

## 2014-03-05 DIAGNOSIS — I4891 Unspecified atrial fibrillation: Secondary | ICD-10-CM | POA: Insufficient documentation

## 2014-03-05 DIAGNOSIS — E039 Hypothyroidism, unspecified: Secondary | ICD-10-CM | POA: Diagnosis not present

## 2014-03-05 DIAGNOSIS — S52501A Unspecified fracture of the lower end of right radius, initial encounter for closed fracture: Secondary | ICD-10-CM

## 2014-03-05 DIAGNOSIS — W19XXXA Unspecified fall, initial encounter: Secondary | ICD-10-CM | POA: Insufficient documentation

## 2014-03-05 HISTORY — DX: Scoliosis, unspecified: M41.9

## 2014-03-05 HISTORY — DX: Anxiety disorder, unspecified: F41.9

## 2014-03-05 HISTORY — PX: OPEN REDUCTION INTERNAL FIXATION (ORIF) DISTAL RADIAL FRACTURE: SHX5989

## 2014-03-05 HISTORY — DX: Personal history of sudden cardiac arrest: Z86.74

## 2014-03-05 HISTORY — DX: Cardiac murmur, unspecified: R01.1

## 2014-03-05 HISTORY — PX: CARPAL TUNNEL RELEASE: SHX101

## 2014-03-05 HISTORY — DX: Presence of automatic (implantable) cardiac defibrillator: Z95.810

## 2014-03-05 HISTORY — DX: Pneumonia, unspecified organism: J18.9

## 2014-03-05 LAB — CBC
HCT: 26.6 % — ABNORMAL LOW (ref 36.0–46.0)
Hemoglobin: 8.7 g/dL — ABNORMAL LOW (ref 12.0–15.0)
MCH: 32.5 pg (ref 26.0–34.0)
MCHC: 32.7 g/dL (ref 30.0–36.0)
MCV: 99.3 fL (ref 78.0–100.0)
PLATELETS: 366 10*3/uL (ref 150–400)
RBC: 2.68 MIL/uL — AB (ref 3.87–5.11)
RDW: 14.7 % (ref 11.5–15.5)
WBC: 5.6 10*3/uL (ref 4.0–10.5)

## 2014-03-05 SURGERY — OPEN REDUCTION INTERNAL FIXATION (ORIF) DISTAL RADIUS FRACTURE
Anesthesia: Regional | Site: Wrist | Laterality: Right

## 2014-03-05 MED ORDER — HYDROMORPHONE HCL PF 1 MG/ML IJ SOLN: 0.2500 mg | INTRAMUSCULAR | Status: DC | PRN

## 2014-03-05 MED ORDER — FENTANYL CITRATE 0.05 MG/ML IJ SOLN
INTRAMUSCULAR | Status: DC | PRN
  Administered 2014-03-05: 50 ug via INTRAVENOUS

## 2014-03-05 MED ORDER — EPHEDRINE SULFATE 50 MG/ML IJ SOLN
INTRAMUSCULAR | Status: AC
  Filled 2014-03-05: qty 1

## 2014-03-05 MED ORDER — MIDAZOLAM HCL 2 MG/2ML IJ SOLN
INTRAMUSCULAR | Status: AC
  Filled 2014-03-05: qty 2

## 2014-03-05 MED ORDER — BUPIVACAINE-EPINEPHRINE (PF) 0.5% -1:200000 IJ SOLN: INTRAMUSCULAR | Status: DC | PRN

## 2014-03-05 MED ORDER — EPHEDRINE SULFATE 50 MG/ML IJ SOLN
INTRAMUSCULAR | Status: DC | PRN
  Administered 2014-03-05: 5 mg via INTRAVENOUS
  Administered 2014-03-05: 10 mg via INTRAVENOUS
  Administered 2014-03-05: 5 mg via INTRAVENOUS
  Administered 2014-03-05: 10 mg via INTRAVENOUS
  Administered 2014-03-05: 5 mg via INTRAVENOUS

## 2014-03-05 MED ORDER — FENTANYL CITRATE 0.05 MG/ML IJ SOLN
INTRAMUSCULAR | Status: AC
  Filled 2014-03-05: qty 5

## 2014-03-05 MED ORDER — BUPIVACAINE HCL (PF) 0.25 % IJ SOLN
INTRAMUSCULAR | Status: AC
  Filled 2014-03-05: qty 30

## 2014-03-05 MED ORDER — ONDANSETRON HCL 4 MG/2ML IJ SOLN
INTRAMUSCULAR | Status: DC | PRN
  Administered 2014-03-05: 4 mg via INTRAVENOUS

## 2014-03-05 MED ORDER — 0.9 % SODIUM CHLORIDE (POUR BTL) OPTIME
TOPICAL | Status: DC | PRN
  Administered 2014-03-05: 1000 mL

## 2014-03-05 MED ORDER — MIDAZOLAM HCL 2 MG/2ML IJ SOLN
INTRAMUSCULAR | Status: AC
  Administered 2014-03-05: 1 mg via INTRAVENOUS
  Filled 2014-03-05: qty 2

## 2014-03-05 MED ORDER — SODIUM CHLORIDE 0.9 % IJ SOLN
INTRAMUSCULAR | Status: AC
  Filled 2014-03-05: qty 10

## 2014-03-05 MED ORDER — PROPOFOL 10 MG/ML IV BOLUS
INTRAVENOUS | Status: DC | PRN
  Administered 2014-03-05: 200 mg via INTRAVENOUS

## 2014-03-05 MED ORDER — FENTANYL CITRATE 0.05 MG/ML IJ SOLN
INTRAMUSCULAR | Status: AC
  Administered 2014-03-05: 100 ug via INTRAVENOUS
  Filled 2014-03-05: qty 2

## 2014-03-05 MED ORDER — LIDOCAINE HCL (CARDIAC) 20 MG/ML IV SOLN
INTRAVENOUS | Status: AC
  Filled 2014-03-05: qty 5

## 2014-03-05 MED ORDER — OXYCODONE-ACETAMINOPHEN 5-325 MG PO TABS: 1.0000 | ORAL_TABLET | ORAL | Status: DC | PRN

## 2014-03-05 MED ORDER — LIDOCAINE HCL (CARDIAC) 20 MG/ML IV SOLN
INTRAVENOUS | Status: DC | PRN
  Administered 2014-03-05: 20 mg via INTRAVENOUS

## 2014-03-05 MED ORDER — LACTATED RINGERS IV SOLN
INTRAVENOUS | Status: DC
  Administered 2014-03-05: 13:00:00 via INTRAVENOUS

## 2014-03-05 MED ORDER — PROPOFOL 10 MG/ML IV BOLUS
INTRAVENOUS | Status: AC
  Filled 2014-03-05: qty 20

## 2014-03-05 MED ORDER — BACITRACIN ZINC 500 UNIT/GM EX OINT
TOPICAL_OINTMENT | CUTANEOUS | Status: AC
  Filled 2014-03-05: qty 15

## 2014-03-05 MED ORDER — CEFAZOLIN SODIUM-DEXTROSE 2-3 GM-% IV SOLR
INTRAVENOUS | Status: AC
  Administered 2014-03-05: 2 g via INTRAVENOUS
  Filled 2014-03-05: qty 50

## 2014-03-05 MED ORDER — BUPIVACAINE-EPINEPHRINE (PF) 0.5% -1:200000 IJ SOLN
INTRAMUSCULAR | Status: DC | PRN
  Administered 2014-03-05: 30 mL

## 2014-03-05 MED ORDER — BACITRACIN ZINC 500 UNIT/GM EX OINT
TOPICAL_OINTMENT | CUTANEOUS | Status: DC | PRN
  Administered 2014-03-05: 1 via TOPICAL

## 2014-03-05 SURGICAL SUPPLY — 70 items
BANDAGE ELASTIC 3 VELCRO ST LF (GAUZE/BANDAGES/DRESSINGS) ×3 IMPLANT
BANDAGE ELASTIC 4 VELCRO ST LF (GAUZE/BANDAGES/DRESSINGS) ×3 IMPLANT
BANDAGE GAUZE ELAST BULKY 4 IN (GAUZE/BANDAGES/DRESSINGS) ×2 IMPLANT
BIT DRILL 2 FAST STEP (BIT) ×2 IMPLANT
BIT DRILL 2.5X4 QC (BIT) ×2 IMPLANT
BNDG CMPR 9X4 STRL LF SNTH (GAUZE/BANDAGES/DRESSINGS) ×1
BNDG ESMARK 4X9 LF (GAUZE/BANDAGES/DRESSINGS) ×3 IMPLANT
BNDG GAUZE ELAST 4 BULKY (GAUZE/BANDAGES/DRESSINGS) ×2 IMPLANT
CLOSURE WOUND 1/2 X4 (GAUZE/BANDAGES/DRESSINGS) ×1
CORDS BIPOLAR (ELECTRODE) ×3 IMPLANT
COVER MAYO STAND STRL (DRAPES) IMPLANT
COVER SURGICAL LIGHT HANDLE (MISCELLANEOUS) ×3 IMPLANT
CUFF TOURNIQUET SINGLE 18IN (TOURNIQUET CUFF) ×3 IMPLANT
CUFF TOURNIQUET SINGLE 24IN (TOURNIQUET CUFF) IMPLANT
DRAPE C-ARM MINI 42X72 WSTRAPS (DRAPES) ×3 IMPLANT
DRAPE OEC MINIVIEW 54X84 (DRAPES) IMPLANT
DRAPE SURG 17X23 STRL (DRAPES) ×3 IMPLANT
DURAPREP 26ML APPLICATOR (WOUND CARE) ×3 IMPLANT
ELECT REM PT RETURN 9FT ADLT (ELECTROSURGICAL)
ELECTRODE REM PT RTRN 9FT ADLT (ELECTROSURGICAL) IMPLANT
GAUZE XEROFORM 1X8 LF (GAUZE/BANDAGES/DRESSINGS) ×3 IMPLANT
GLOVE BIO SURGEON STRL SZ8.5 (GLOVE) ×3 IMPLANT
GLOVE BIOGEL PI IND STRL 7.5 (GLOVE) IMPLANT
GLOVE BIOGEL PI INDICATOR 7.5 (GLOVE) ×2
GOWN STRL REUS W/ TWL LRG LVL3 (GOWN DISPOSABLE) ×1 IMPLANT
GOWN STRL REUS W/ TWL XL LVL3 (GOWN DISPOSABLE) ×1 IMPLANT
GOWN STRL REUS W/TWL 2XL LVL3 (GOWN DISPOSABLE) ×1 IMPLANT
GOWN STRL REUS W/TWL LRG LVL3 (GOWN DISPOSABLE) ×3
GOWN STRL REUS W/TWL XL LVL3 (GOWN DISPOSABLE) ×6
KIT BASIN OR (CUSTOM PROCEDURE TRAY) ×3 IMPLANT
KIT ROOM TURNOVER OR (KITS) ×3 IMPLANT
MANIFOLD NEPTUNE II (INSTRUMENTS) ×3 IMPLANT
NDL HYPO 25GX1X1/2 BEV (NEEDLE) IMPLANT
NEEDLE HYPO 25GX1X1/2 BEV (NEEDLE) IMPLANT
NS IRRIG 1000ML POUR BTL (IV SOLUTION) ×3 IMPLANT
PACK ORTHO EXTREMITY (CUSTOM PROCEDURE TRAY) ×3 IMPLANT
PAD ARMBOARD 7.5X6 YLW CONV (MISCELLANEOUS) ×6 IMPLANT
PAD CAST 3X4 CTTN HI CHSV (CAST SUPPLIES) ×1 IMPLANT
PAD CAST 4YDX4 CTTN HI CHSV (CAST SUPPLIES) ×1 IMPLANT
PADDING CAST COTTON 3X4 STRL (CAST SUPPLIES) ×3
PADDING CAST COTTON 4X4 STRL (CAST SUPPLIES) ×3
PEG SUBCHONDRAL SMOOTH 2.0X20 (Peg) ×8 IMPLANT
PEG SUBCHONDRAL SMOOTH 2.0X22 (Peg) ×4 IMPLANT
PENCIL BUTTON HOLSTER BLD 10FT (ELECTRODE) IMPLANT
SCREW BN 12X3.5XNS CORT TI (Screw) IMPLANT
SCREW CORT 3.5X12 (Screw) ×6 IMPLANT
SPLINT PLASTER CAST XFAST 4X15 (CAST SUPPLIES) IMPLANT
SPLINT PLASTER XTRA FAST SET 4 (CAST SUPPLIES) ×2
SPONGE GAUZE 4X4 12PLY (GAUZE/BANDAGES/DRESSINGS) ×3 IMPLANT
SPONGE GAUZE 4X4 12PLY STER LF (GAUZE/BANDAGES/DRESSINGS) ×2 IMPLANT
SPONGE LAP 4X18 X RAY DECT (DISPOSABLE) IMPLANT
SPONGE SCRUB IODOPHOR (GAUZE/BANDAGES/DRESSINGS) ×3 IMPLANT
STRIP CLOSURE SKIN 1/2X4 (GAUZE/BANDAGES/DRESSINGS) ×2 IMPLANT
SUT ETHILON 4 0 PS 2 18 (SUTURE) IMPLANT
SUT PROLENE 3 0 PS 2 (SUTURE) IMPLANT
SUT VIC AB 2-0 SH 27 (SUTURE) ×3
SUT VIC AB 2-0 SH 27XBRD (SUTURE) IMPLANT
SUT VIC AB 3-0 FS2 27 (SUTURE) IMPLANT
SUT VIC AB 3-0 PS2 18 (SUTURE)
SUT VIC AB 3-0 PS2 18XBRD (SUTURE) IMPLANT
SUT VIC AB 4-0 PS2 27 (SUTURE) ×2 IMPLANT
SUT VICRYL 4-0 PS2 18IN ABS (SUTURE) IMPLANT
SUT VICRYL RAPIDE 4/0 PS 2 (SUTURE) ×2 IMPLANT
SYR CONTROL 10ML LL (SYRINGE) IMPLANT
TOWEL OR 17X24 6PK STRL BLUE (TOWEL DISPOSABLE) ×3 IMPLANT
TOWEL OR 17X26 10 PK STRL BLUE (TOWEL DISPOSABLE) ×3 IMPLANT
TUBE CONNECTING 12'X1/4 (SUCTIONS) ×1
TUBE CONNECTING 12X1/4 (SUCTIONS) ×1 IMPLANT
UNDERPAD 30X30 INCONTINENT (UNDERPADS AND DIAPERS) ×3 IMPLANT
WATER STERILE IRR 1000ML POUR (IV SOLUTION) ×1 IMPLANT

## 2014-03-05 NOTE — Transfer of Care (Signed)
Immediate Anesthesia Transfer of Care Note  Patient: [name]  Procedure(s) Performed: Procedure(s): OPEN REDUCTION INTERNAL FIXATION (ORIF) RIGHT DISTAL RADIUS FRACTURE  (Right) RIGHT CARPAL TUNNEL RELEASE (Right)  Patient Location: PACU  Anesthesia Type:General and Regional  Level of Consciousness: awake, alert , oriented and patient cooperative  Airway & Oxygen Therapy: Patient Spontanous Breathing and Patient connected to nasal cannula oxygen  Post-op Assessment: Report given to PACU RN and Post -op Vital signs reviewed and stable  Post vital signs: Reviewed and stable  Complications: No apparent anesthesia complications

## 2014-03-05 NOTE — Op Note (Signed)
See note [phone]

## 2014-03-05 NOTE — Anesthesia Procedure Notes (Signed)
Anesthesia Regional Block:  Supraclavicular block  Pre-Anesthetic Checklist: ,, timeout performed, Correct Patient, Correct Site, Correct Laterality, Correct Procedure, Correct Position, site marked, Risks and benefits discussed, pre-op evaluation, post-op pain management  Laterality: Right  Prep: Maximum Sterile Barrier Precautions used and chloraprep       Needles:  Injection technique: Single-shot  Needle Type: Echogenic Stimulator Needle     Needle Length: 5cm 5 cm Needle Gauge: 22 and 22 G    Additional Needles:  Procedures: ultrasound guided (picture in chart) Supraclavicular block Narrative:  Start time: 03/05/2014 1:10 PM End time: 03/05/2014 1:18 PM Injection made incrementally with aspirations every 5 mL. Anesthesiologist: Tenasia Aull,MD  Additional Notes: 2% Lidocaine skin wheel.

## 2014-03-05 NOTE — Interval H&P Note (Signed)
History and Physical Interval Note:  03/05/2014 7:17 AM  [name]  has presented today for surgery, with the diagnosis of FRACTURE RIGHT DISTAL RADIUS   The various methods of treatment have been discussed with the patient and family. After consideration of risks, benefits and other options for treatment, the patient has consented to  Procedure(s): OPEN REDUCTION INTERNAL FIXATION (ORIF) RIGHT DISTAL RADIUS FRACTURE  (Right) RIGHT CARPAL TUNNEL RELEASE (Right) as a surgical intervention .  The patient's history has been reviewed, patient examined, no change in status, stable for surgery.  I have reviewed the patient's chart and labs.  Questions were answered to the patient's satisfaction.     Schuyler Amor

## 2014-03-05 NOTE — Anesthesia Preprocedure Evaluation (Signed)
Anesthesia Evaluation  Patient identified by MRN, date of birth, ID band Patient awake    Reviewed: Allergy & Precautions, H&P , NPO status , Patient's Chart, lab work & pertinent test results  Airway Mallampati: II TM Distance: >3 FB Neck ROM: Full    Dental no notable dental hx. (+) Teeth Intact, Dental Advisory Given   Pulmonary neg pulmonary ROS,  breath sounds clear to auscultation  Pulmonary exam normal       Cardiovascular hypertension, On Medications +CHF + dysrhythmias Atrial Fibrillation + Cardiac Defibrillator + Valvular Problems/Murmurs Rhythm:Regular Rate:Normal     Neuro/Psych  Headaches, negative psych ROS   GI/Hepatic Neg liver ROS, GERD-  Medicated and Controlled,  Endo/Other  Hypothyroidism   Renal/GU negative Renal ROS  negative genitourinary   Musculoskeletal   Abdominal   Peds  Hematology negative hematology ROS (+)   Anesthesia Other Findings   Reproductive/Obstetrics negative OB ROS                           Anesthesia Physical Anesthesia Plan  ASA: III  Anesthesia Plan: General and Regional   Post-op Pain Management:    Induction: Intravenous  Airway Management Planned: LMA  Additional Equipment:   Intra-op Plan:   Post-operative Plan: Extubation in OR  Informed Consent: I have reviewed the patients History and Physical, chart, labs and discussed the procedure including the risks, benefits and alternatives for the proposed anesthesia with the patient or authorized representative who has indicated his/her understanding and acceptance.   Dental advisory given  Plan Discussed with: CRNA  Anesthesia Plan Comments:         Anesthesia Quick Evaluation

## 2014-03-05 NOTE — Interval H&P Note (Signed)
History and Physical Interval Note:  03/05/2014 7:20 AM  [name]  has presented today for surgery, with the diagnosis of FRACTURE RIGHT DISTAL RADIUS   The various methods of treatment have been discussed with the patient and family. After consideration of risks, benefits and other options for treatment, the patient has consented to  Procedure(s): OPEN REDUCTION INTERNAL FIXATION (ORIF) RIGHT DISTAL RADIUS FRACTURE  (Right) RIGHT CARPAL TUNNEL RELEASE (Right) as a surgical intervention .  The patient's history has been reviewed, patient examined, no change in status, stable for surgery.  I have reviewed the patient's chart and labs.  Questions were answered to the patient's satisfaction.     Schuyler Amor

## 2014-03-06 NOTE — Anesthesia Postprocedure Evaluation (Signed)
  Anesthesia Post-op Note  Patient: [name]  Procedure(s) Performed: Procedure(s): OPEN REDUCTION INTERNAL FIXATION (ORIF) RIGHT DISTAL RADIUS FRACTURE  (Right) RIGHT CARPAL TUNNEL RELEASE (Right)  Patient Location: PACU  Anesthesia Type:General and block  Level of Consciousness: awake and alert   Airway and Oxygen Therapy: Patient Spontanous Breathing  Post-op Pain: none  Post-op Assessment: Post-op Vital signs reviewed, Patient's Cardiovascular Status Stable and Respiratory Function Stable  Post-op Vital Signs: Reviewed  Filed Vitals:   03/05/14 1647  BP: 132/56  Pulse: 61  Temp:   Resp: 16    Complications: No apparent anesthesia complications

## 2014-03-06 NOTE — Op Note (Signed)
NAMEGALAXY, VICKERY                ACCOUNT NO.:  [id number]  MEDICAL RECORD NO.:  [id number]  LOCATION:  [facility]                         FACILITY:  [facility]  PHYSICIAN:  Sheral Apley. Ming Kunka, M.D.DATE OF BIRTH:  09/19/1946  DATE OF PROCEDURE:  03/05/2014 DATE OF DISCHARGE:  03/05/2014                              OPERATIVE REPORT   PREOPERATIVE DIAGNOSIS:  Displaced intra-articular fracture, right distal radius.  POSTOPERATIVE DIAGNOSIS:  Displaced intra-articular fracture, right distal radius.  PROCEDURE:  Open reduction and internal fixation above with carpal tunnel release.  SURGEON:  Sheral Apley. Burney Gauze, M.D.  ASSISTANT:  Marily Lente Dasnoit, PA-C.  ANESTHESIA:  Axillary block and general.  COMPLICATIONS:  None.  DRAINS:  None.  DESCRIPTION OF PROCEDURE:  The patient was taken to the operating suite. After the induction of adequate axillary block analgesia and then general laryngeal mask airway anesthetic, the right upper extremity was prepped and draped in sterile fashion.  An Esmarch was used to exsanguinate the limb.  Tourniquet was inflated to 250 mmHg.  At this point in time, incision was made on the lower aspect of the right distal radius.  Skin was incised of the FCR.  The FCR sheath was incised.  The FCR was tracked in the midline, radial artery on the lateral side. Fascia was incised.  Dissection was carried down to the pronator quadratus.  We subperiosteally dissected the pronator quadratus off the volar aspect of the distal radius to reveal a 4-part fracture.  We released the brachioradialis off the distal fragment to aid in reduction.  We then used flexion and ulnar deviations traction to reduce the fracture.  Intraoperative fluoroscopy revealed inadequate reduction. We then used a elevator to buttress the ulnar corner of the proximal fragment pushing it immediately.  We then achieved anatomic reduction. Standard DVR plate was fastened to the slotted drill hole,  deemed to be too large grossly and fluoroscopically.  This was then switched to a narrow plate which was fixed with 3 cortical screws proximally followed by the smooth pegs distally.  Intraoperative fluoroscopy revealed adequate reduction AP, lateral and oblique view.  The wound was thoroughly irrigated, loosely closed in layers of 2-0 undyed Vicryl to reapproximate the pronator quadratus and a 4-0 Vicryl Rapide subcuticular stitch on the skin.  Steri-Strips, 4x4s, fluffs, and a volar splint was applied.  The patient tolerated the procedure well in a concealed fashion.     Sheral Apley Burney Gauze, M.D.     MAW/MEDQ  D:  03/05/2014  T:  03/06/2014  Job:  [id number]

## 2014-03-07 ENCOUNTER — Encounter (HOSPITAL_COMMUNITY): Payer: Self-pay | Admitting: Orthopedic Surgery

## 2014-03-11 DIAGNOSIS — S52599A Other fractures of lower end of unspecified radius, initial encounter for closed fracture: Secondary | ICD-10-CM | POA: Diagnosis not present

## 2014-03-11 DIAGNOSIS — I10 Essential (primary) hypertension: Secondary | ICD-10-CM | POA: Diagnosis not present

## 2014-03-11 DIAGNOSIS — I4891 Unspecified atrial fibrillation: Secondary | ICD-10-CM | POA: Diagnosis not present

## 2014-03-11 DIAGNOSIS — E785 Hyperlipidemia, unspecified: Secondary | ICD-10-CM | POA: Diagnosis not present

## 2014-03-11 DIAGNOSIS — E039 Hypothyroidism, unspecified: Secondary | ICD-10-CM | POA: Diagnosis not present

## 2014-03-11 DIAGNOSIS — I428 Other cardiomyopathies: Secondary | ICD-10-CM | POA: Diagnosis not present

## 2014-03-11 DIAGNOSIS — I509 Heart failure, unspecified: Secondary | ICD-10-CM | POA: Diagnosis not present

## 2014-03-21 ENCOUNTER — Encounter (HOSPITAL_COMMUNITY): Payer: Self-pay | Admitting: Orthopedic Surgery

## 2014-03-21 NOTE — OR Nursing (Signed)
Late entry on 03-21-2014 by D. Sallye Lunz, RN to add the procedure end time.  

## 2014-03-31 IMAGING — CR DG CHEST 2V
2 series · 2 of 2 positions shown · non-contrast
Comparison: 05/30/2013 and earlier.

CLINICAL DATA: 67-year-old female status post heart surgery.
Weakness.

EXAM:
CHEST  2 VIEW

[w chest pa]
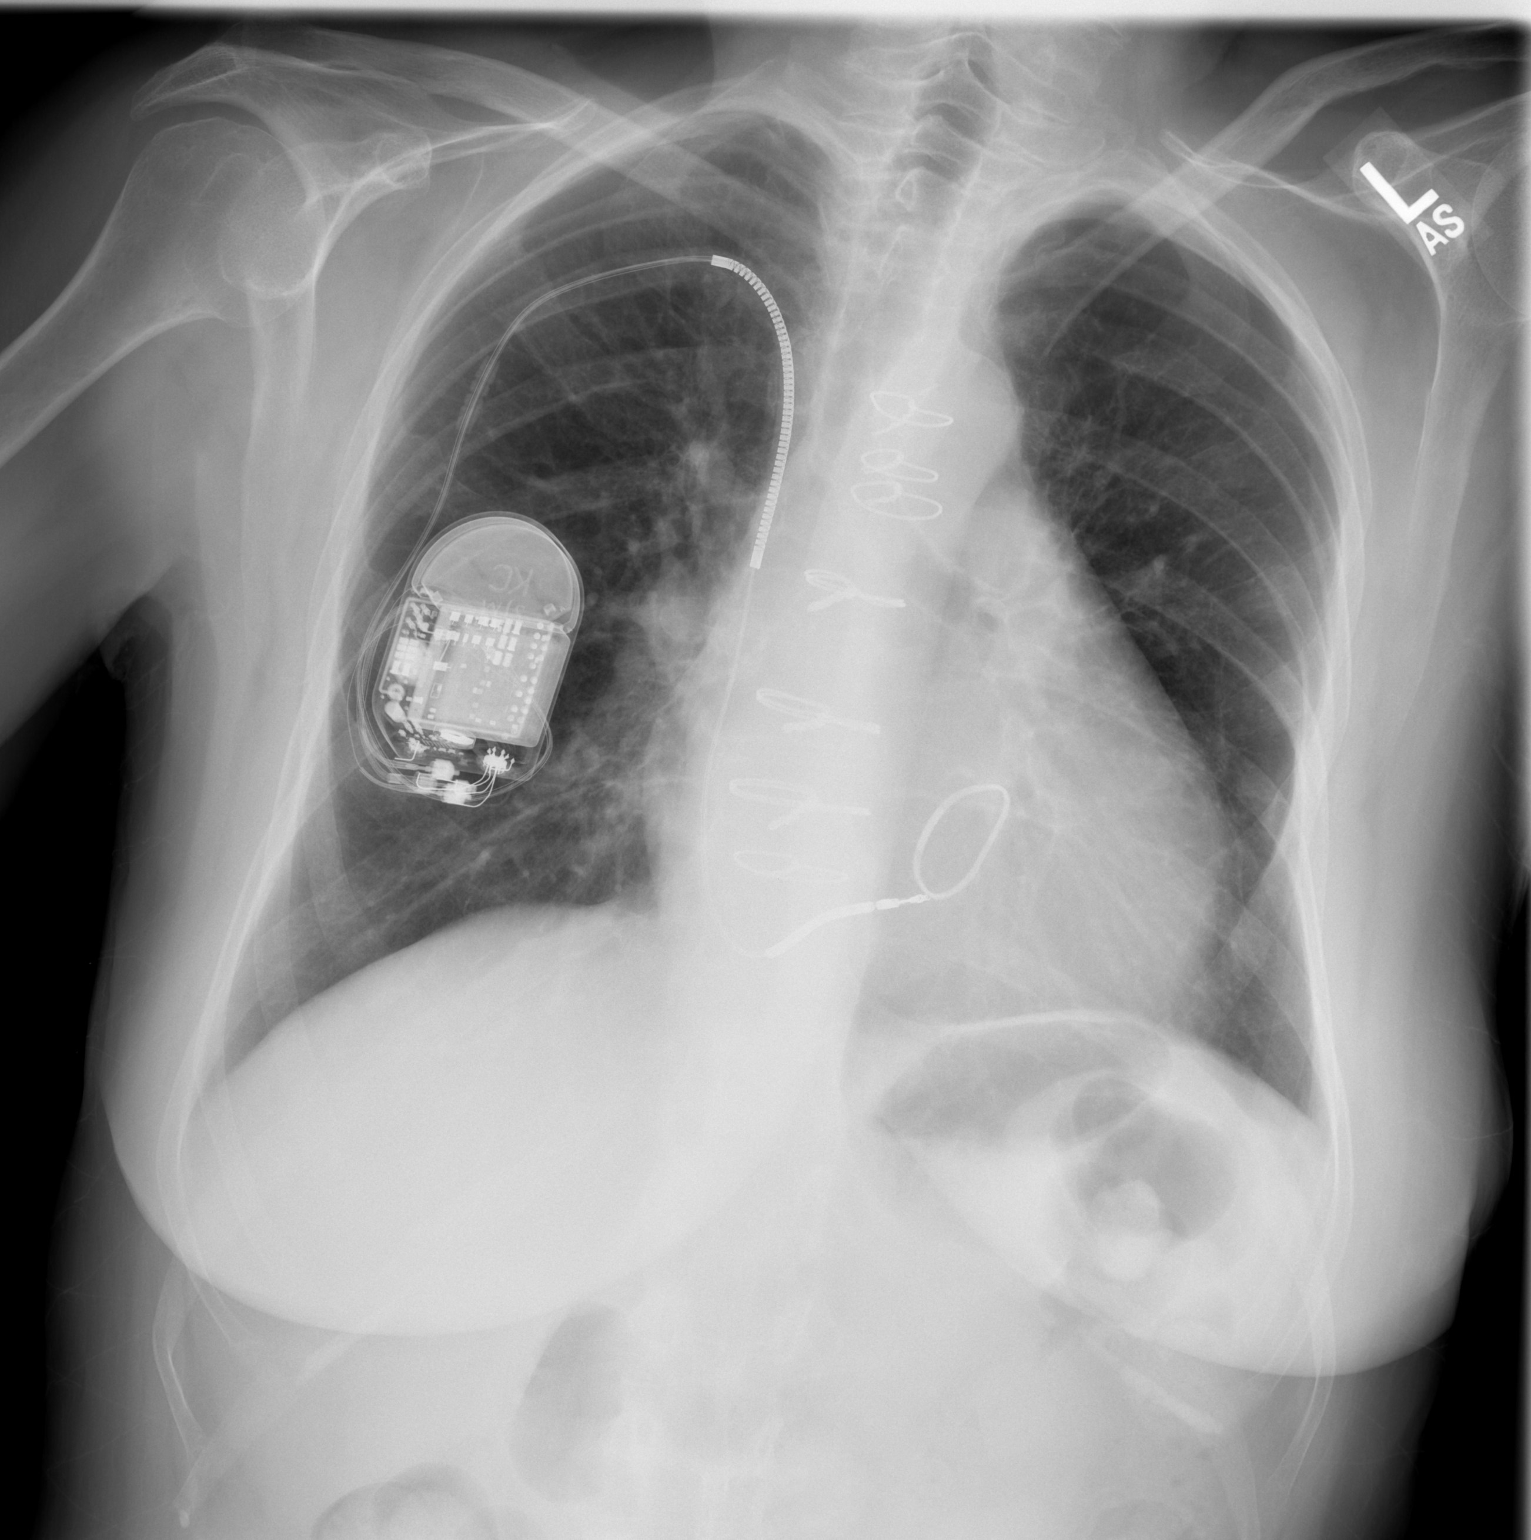

[w chest lat]
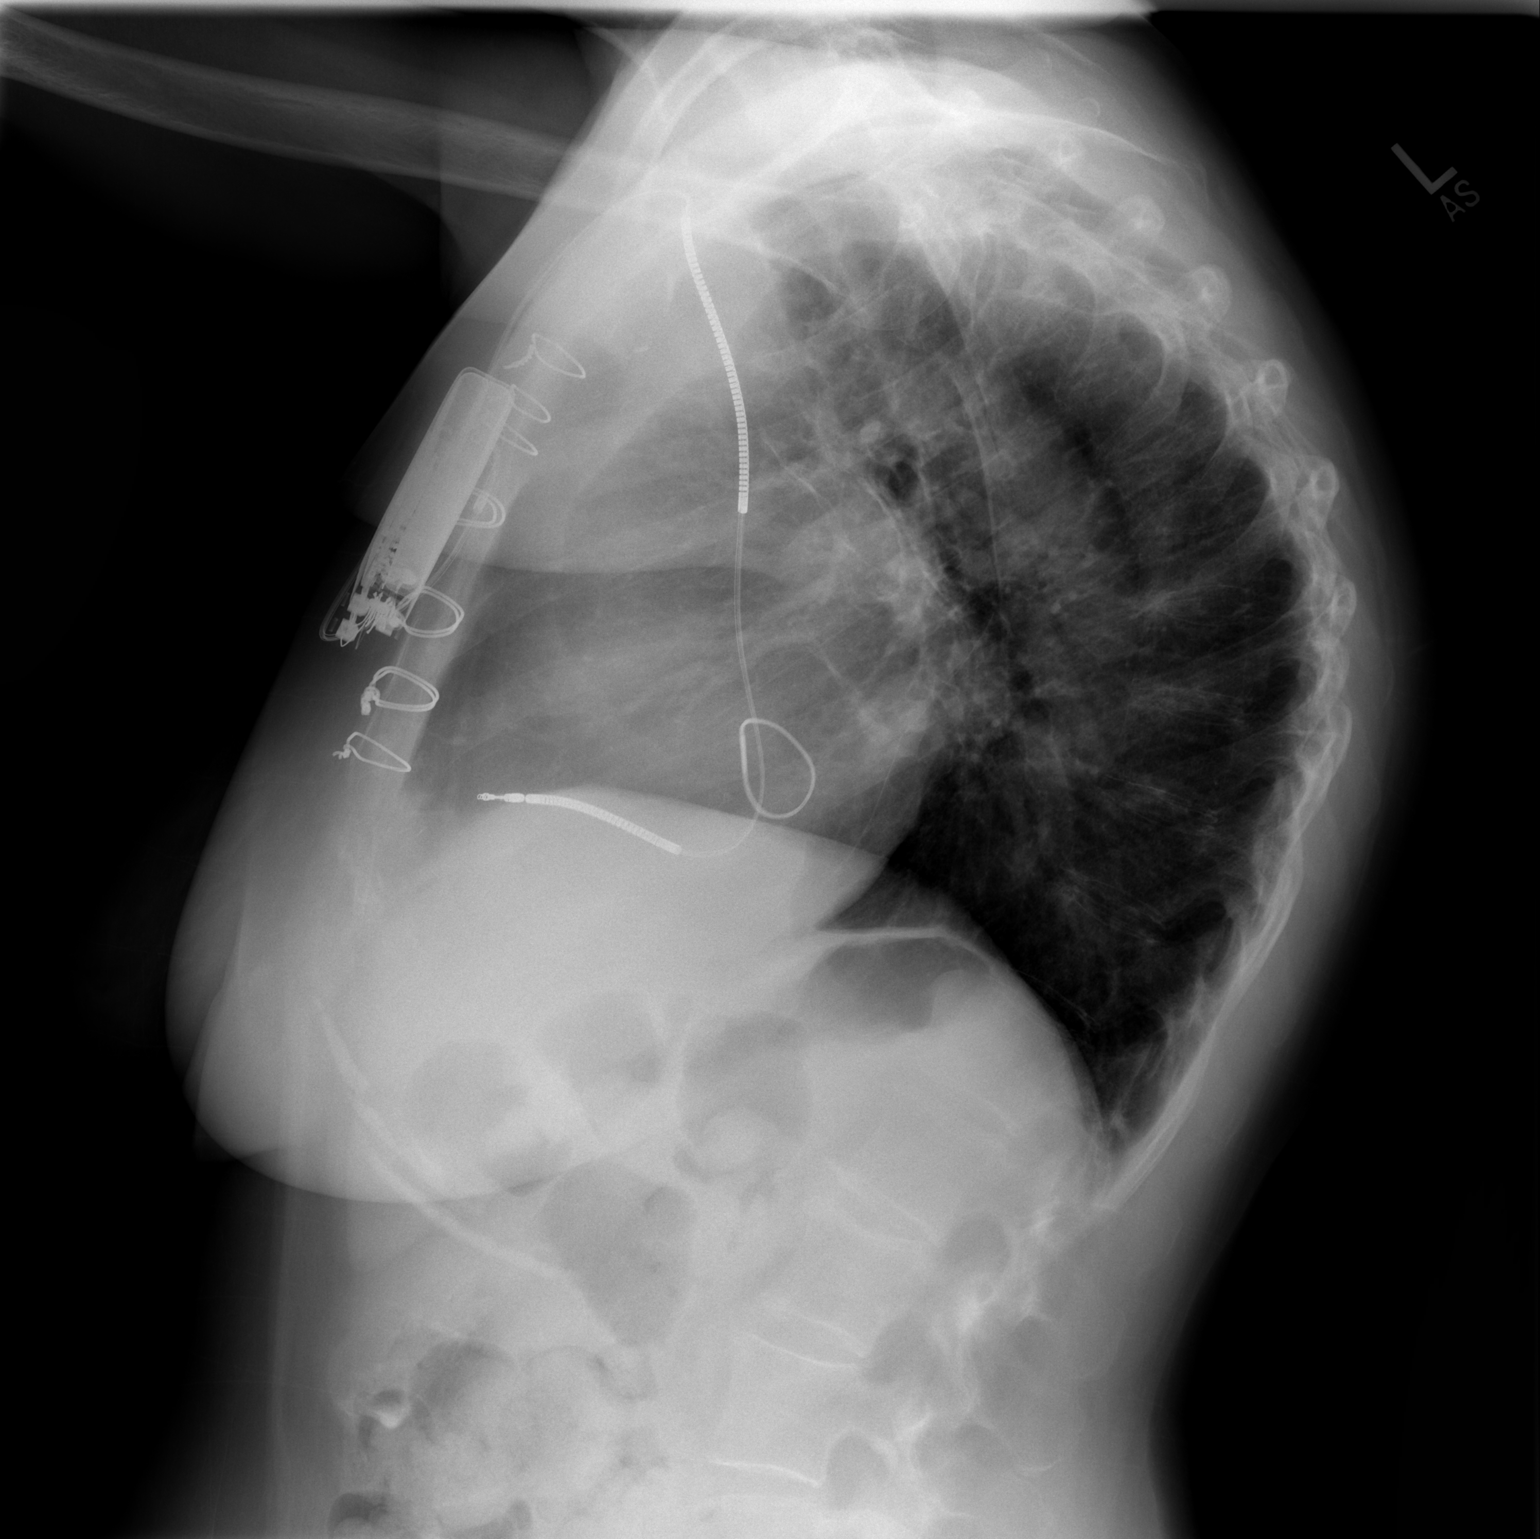

[2 of 2 positions shown; findings below may reference images not displayed]

FINDINGS: Larger lung volumes and improved bibasilar ventilation. No residual
pleural effusion. No pneumothorax or pulmonary edema. Stable
postoperative changes to the heart and mediastinum including right
chest cardiac AICD. Other mediastinal contours are within normal
limits. Visualized tracheal air column is within normal limits.
Exaggerated kyphosis. No acute osseous abnormality identified.
IMPRESSION: Improved bibasilar ventilation and resolved small left pleural
effusion. No acute cardiopulmonary abnormality.

## 2014-04-01 DIAGNOSIS — S52599A Other fractures of lower end of unspecified radius, initial encounter for closed fracture: Secondary | ICD-10-CM | POA: Diagnosis not present

## 2014-04-02 ENCOUNTER — Encounter: Payer: Self-pay | Admitting: Cardiology

## 2014-04-24 ENCOUNTER — Encounter: Payer: Self-pay | Admitting: Internal Medicine

## 2014-04-24 ENCOUNTER — Ambulatory Visit (INDEPENDENT_AMBULATORY_CARE_PROVIDER_SITE_OTHER): Payer: Medicare Other | Admitting: *Deleted

## 2014-04-24 DIAGNOSIS — I428 Other cardiomyopathies: Secondary | ICD-10-CM

## 2014-04-24 DIAGNOSIS — I5022 Chronic systolic (congestive) heart failure: Secondary | ICD-10-CM | POA: Diagnosis not present

## 2014-04-24 LAB — MDC_IDC_ENUM_SESS_TYPE_INCLINIC
Date Time Interrogation Session: 20150709141901
HighPow Impedance: 35.8514
HighPow Impedance: 36 Ohm
Implantable Pulse Generator Serial Number: 745406
Lead Channel Pacing Threshold Amplitude: 1.25 V
Lead Channel Pacing Threshold Pulse Width: 0.4 ms
Lead Channel Pacing Threshold Pulse Width: 0.4 ms
Lead Channel Setting Pacing Amplitude: 2.5 V
Lead Channel Setting Sensing Sensitivity: 0.5 mV
MDC IDC MSMT BATTERY REMAINING LONGEVITY: 76.8 mo
MDC IDC MSMT LEADCHNL RV IMPEDANCE VALUE: 400 Ohm
MDC IDC MSMT LEADCHNL RV PACING THRESHOLD AMPLITUDE: 1.25 V
MDC IDC MSMT LEADCHNL RV SENSING INTR AMPL: 6.9 mV
MDC IDC SET LEADCHNL RV PACING PULSEWIDTH: 0.4 ms
MDC IDC SET ZONE DETECTION INTERVAL: 300 ms
MDC IDC STAT BRADY RV PERCENT PACED: 0.01 %
Zone Setting Detection Interval: 260 ms

## 2014-04-24 NOTE — Progress Notes (Signed)
ICD check in clinic. Normal device function. Thresholds and sensing consistent with previous device measurements. Impedance trends stable over time. No evidence of any ventricular arrhythmias. CorVue abnormal for 8 days in mid June.   Histogram distribution appropriate for patient and level of activity. No changes made this session. Device programmed at appropriate safety margins. Device programmed to optimize intrinsic conduction. Estimated longevity 6.2 years.  Patient education completed including shock plan. Alert tones/vibration demonstrated for patient.  ROV 3 months with the device clinic.

## 2014-04-29 DIAGNOSIS — S52599A Other fractures of lower end of unspecified radius, initial encounter for closed fracture: Secondary | ICD-10-CM | POA: Diagnosis not present

## 2014-05-22 DIAGNOSIS — M159 Polyosteoarthritis, unspecified: Secondary | ICD-10-CM | POA: Diagnosis not present

## 2014-05-22 DIAGNOSIS — E039 Hypothyroidism, unspecified: Secondary | ICD-10-CM | POA: Diagnosis not present

## 2014-05-22 DIAGNOSIS — I10 Essential (primary) hypertension: Secondary | ICD-10-CM | POA: Diagnosis not present

## 2014-05-22 DIAGNOSIS — I4891 Unspecified atrial fibrillation: Secondary | ICD-10-CM | POA: Diagnosis not present

## 2014-05-22 DIAGNOSIS — I428 Other cardiomyopathies: Secondary | ICD-10-CM | POA: Diagnosis not present

## 2014-05-22 DIAGNOSIS — I509 Heart failure, unspecified: Secondary | ICD-10-CM | POA: Diagnosis not present

## 2014-05-22 DIAGNOSIS — E785 Hyperlipidemia, unspecified: Secondary | ICD-10-CM | POA: Diagnosis not present

## 2014-06-17 DIAGNOSIS — S52599A Other fractures of lower end of unspecified radius, initial encounter for closed fracture: Secondary | ICD-10-CM | POA: Diagnosis not present

## 2014-08-19 ENCOUNTER — Encounter: Payer: Self-pay | Admitting: *Deleted

## 2014-08-19 DIAGNOSIS — S52531D Colles' fracture of right radius, subsequent encounter for closed fracture with routine healing: Secondary | ICD-10-CM | POA: Diagnosis not present

## 2014-08-21 DIAGNOSIS — I480 Paroxysmal atrial fibrillation: Secondary | ICD-10-CM | POA: Diagnosis not present

## 2014-08-21 DIAGNOSIS — I2510 Atherosclerotic heart disease of native coronary artery without angina pectoris: Secondary | ICD-10-CM | POA: Diagnosis not present

## 2014-08-21 DIAGNOSIS — I10 Essential (primary) hypertension: Secondary | ICD-10-CM | POA: Diagnosis not present

## 2014-08-21 DIAGNOSIS — I429 Cardiomyopathy, unspecified: Secondary | ICD-10-CM | POA: Diagnosis not present

## 2014-08-21 DIAGNOSIS — M1990 Unspecified osteoarthritis, unspecified site: Secondary | ICD-10-CM | POA: Diagnosis not present

## 2014-08-21 DIAGNOSIS — E785 Hyperlipidemia, unspecified: Secondary | ICD-10-CM | POA: Diagnosis not present

## 2014-08-21 DIAGNOSIS — E039 Hypothyroidism, unspecified: Secondary | ICD-10-CM | POA: Diagnosis not present

## 2014-08-21 DIAGNOSIS — I509 Heart failure, unspecified: Secondary | ICD-10-CM | POA: Diagnosis not present

## 2014-09-18 ENCOUNTER — Encounter: Payer: Self-pay | Admitting: Internal Medicine

## 2014-09-25 ENCOUNTER — Encounter (HOSPITAL_COMMUNITY): Payer: Self-pay | Admitting: Cardiology

## 2014-10-27 ENCOUNTER — Other Ambulatory Visit: Payer: Self-pay | Admitting: Internal Medicine

## 2014-10-28 ENCOUNTER — Encounter: Payer: Self-pay | Admitting: Internal Medicine

## 2014-11-03 ENCOUNTER — Encounter: Payer: Self-pay | Admitting: Internal Medicine

## 2014-11-03 ENCOUNTER — Ambulatory Visit (INDEPENDENT_AMBULATORY_CARE_PROVIDER_SITE_OTHER): Payer: Medicare Other | Admitting: Internal Medicine

## 2014-11-03 VITALS — BP 130/84 | HR 62 | Ht 64.0 in | Wt 161.2 lb

## 2014-11-03 DIAGNOSIS — Z95810 Presence of automatic (implantable) cardiac defibrillator: Secondary | ICD-10-CM | POA: Diagnosis not present

## 2014-11-03 DIAGNOSIS — I4901 Ventricular fibrillation: Secondary | ICD-10-CM

## 2014-11-03 DIAGNOSIS — I5022 Chronic systolic (congestive) heart failure: Secondary | ICD-10-CM | POA: Diagnosis not present

## 2014-11-03 DIAGNOSIS — I10 Essential (primary) hypertension: Secondary | ICD-10-CM

## 2014-11-03 LAB — MDC_IDC_ENUM_SESS_TYPE_INCLINIC
Battery Remaining Longevity: 73.2 mo
Brady Statistic RV Percent Paced: 0.03 %
Date Time Interrogation Session: 20160118141018
HIGH POWER IMPEDANCE MEASURED VALUE: 41.2798
HighPow Impedance: 41 Ohm
Implantable Pulse Generator Serial Number: 745406
Lead Channel Pacing Threshold Amplitude: 1 V
Lead Channel Pacing Threshold Amplitude: 1 V
Lead Channel Pacing Threshold Pulse Width: 0.4 ms
Lead Channel Pacing Threshold Pulse Width: 0.4 ms
Lead Channel Setting Pacing Pulse Width: 0.4 ms
Lead Channel Setting Sensing Sensitivity: 0.5 mV
MDC IDC MSMT LEADCHNL RV IMPEDANCE VALUE: 400 Ohm
MDC IDC MSMT LEADCHNL RV SENSING INTR AMPL: 6.6 mV
MDC IDC SET LEADCHNL RV PACING AMPLITUDE: 2.5 V
Zone Setting Detection Interval: 260 ms
Zone Setting Detection Interval: 300 ms

## 2014-11-03 MED ORDER — AMIODARONE HCL 200 MG PO TABS: 200.0000 mg | ORAL_TABLET | Freq: Every day | ORAL | Status: DC

## 2014-11-03 NOTE — Patient Instructions (Signed)
Your physician has recommended you make the following change in your medication:  1.) decrease amiodarone to 200 mg once daily  Your physician wants you to follow-up in: 12 months with Dr. Lovena Le.  You will receive a reminder letter in the mail two months in advance. If you don't receive a letter, please call our office to schedule the follow-up appointment.  Your physician wants you to follow-up in: 3 months with device clinic.   You will receive a reminder letter in the mail two months in advance. If you don't receive a letter, please call our office to schedule the follow-up appointment.

## 2014-11-03 NOTE — Assessment & Plan Note (Addendum)
She has had no recurrent sustained ventricular arrhythmias requiring ICD therapy. No change in meds. The patient is currently taking 200 mg of amiodarone daily.

## 2014-11-03 NOTE — Assessment & Plan Note (Signed)
Her St. Jude ICD is working normally. Will recheck in several months.

## 2014-11-03 NOTE — Assessment & Plan Note (Signed)
Her blood pressure is well controlled. No change in medical therapy.

## 2014-11-03 NOTE — Assessment & Plan Note (Signed)
Her symptoms remain class 2. She is fairly sedentary. She will continue her current meds.

## 2014-11-03 NOTE — Progress Notes (Signed)
HPI Mrs. Killeen returns today for followup. She is a very pleasant 69 year old woman, with coronary disease, mitral regurgitation, chronic systolic heart failure, status post ventricular fibrillation arrest. She is status post ICD implantation.  She does admit to dietary and medical noncompliance but has done better in the past year. Minimal peripheral edema. She denies anginal symptoms. Her heart failure remains class 2. Allergies  Allergen Reactions  . Morphine And Related Itching and Nausea And Vomiting  . Codeine Nausea And Vomiting  . Meperidine Hcl Nausea And Vomiting     Current Outpatient Prescriptions  Medication Sig Dispense Refill  . acetaminophen (TYLENOL) 500 MG tablet Take 1,000 mg by mouth every 6 (six) hours as needed. For pain    . amiodarone (PACERONE) 200 MG tablet Take 1 tablet (200 mg total) by mouth daily. 60 tablet 1  . aspirin EC 81 MG tablet Take 81 mg by mouth every other day.    Marland Kitchen aspirin-acetaminophen-caffeine (EXCEDRIN MIGRAINE) 250-250-65 MG per tablet Take 1 tablet by mouth every 6 (six) hours as needed for headache.    . diazepam (VALIUM) 2 MG tablet Take 2 mg by mouth 2 (two) times daily.     . fish oil-omega-3 fatty acids 1000 MG capsule Take 4 capsules by mouth daily.     Marland Kitchen ibuprofen (ADVIL,MOTRIN) 400 MG tablet Take 1 tablet (400 mg total) by mouth every 6 (six) hours as needed. (Patient taking differently: Take 400 mg by mouth every 6 (six) hours as needed (pain). ) 30 tablet 0  . levothyroxine (SYNTHROID, LEVOTHROID) 112 MCG tablet Take 112 mcg by mouth daily before breakfast.    . losartan (COZAAR) 100 MG tablet Take 1 tablet by mouth daily.    . metoprolol succinate (TOPROL-XL) 50 MG 24 hr tablet Take 50 mg by mouth daily. Take with or immediately following a meal.    . OVER THE COUNTER MEDICATION Place 1 spray into both nostrils daily as needed (nasal congestion). Nasal Spray    . oxyCODONE-acetaminophen (ROXICET) 5-325 MG per tablet Take 1 tablet by  mouth every 4 (four) hours as needed for severe pain. 30 tablet 0  . pantoprazole (PROTONIX) 40 MG tablet Take 40 mg by mouth daily.    Marland Kitchen PARoxetine (PAXIL) 30 MG tablet Take 60 mg by mouth daily.     . Polyvinyl Alcohol-Povidone (MURINE TEARS FOR DRY EYES OP) Place 2 drops into both eyes 2 (two) times daily as needed. For dry eyes    . pravastatin (PRAVACHOL) 40 MG tablet TAKE ONE TABLET BY MOUTH EACH EVENING 30 tablet 0   No current facility-administered medications for this visit.     Past Medical History  Diagnosis Date  . Arthritis   . Hypertension   . Paroxysmal atrial fibrillation   . HLD (hyperlipidemia)   . Hypothyroidism   . Depression   . CHF (congestive heart failure) 05/2013    class III, severe  . GERD (gastroesophageal reflux disease)   . Severe mitral regurgitation 05/2013    s/p MV repair with annuloplasty  . Anxiety   . Heart murmur   . Headache(784.0)     migraines  . Scoliosis   . Pneumonia   . History of sudden cardiac arrest successfully resuscitated 2011  . Automatic implantable cardioverter-defibrillator in situ     ROS:   All systems reviewed and negative except as noted in the HPI.   Past Surgical History  Procedure Laterality Date  . Cardiac defibrillator placement  2010  Vfib arrest, single chamber ICD  . Abdominal hysterectomy    . Hernia repair  as child    x 2  . Tee without cardioversion  09/24/2012    Procedure: TRANSESOPHAGEAL ECHOCARDIOGRAM (TEE);  Surgeon: Fay Records, MD;  Location: [facility] ENDOSCOPY;  Service: Cardiovascular;  Laterality: N/A;  . Multiple extractions with alveoloplasty N/A 05/21/2013    Procedure: Extraction of tooth #'s 2,4,18 with alveoloplasty and gross debridement of remaining teeth;  Surgeon: Lenn Cal, DDS;  Location: [facility];  Service: Oral Surgery;  Laterality: N/A;  . Mitral valve repair N/A 05/24/2013    Surgeon: Ivin Poot, MD; Service: Open Heart Surgery  . Intraoperative transesophageal  echocardiogram N/A 05/24/2013    Procedure: INTRAOPERATIVE TRANSESOPHAGEAL ECHOCARDIOGRAM;  Surgeon: Ivin Poot, MD;  Location: [facility];  Service: Open Heart Surgery;  Laterality: N/A;  . Appendectomy    . Cardiac catheterization    . Colonoscopy    . Open reduction internal fixation (orif) distal radial fracture Right 03/05/2014    Procedure: OPEN REDUCTION INTERNAL FIXATION (ORIF) RIGHT DISTAL RADIUS FRACTURE ;  Surgeon: Schuyler Amor, MD;  Location: [facility];  Service: Orthopedics;  Laterality: Right;  . Carpal tunnel release Right 03/05/2014    Procedure: RIGHT CARPAL TUNNEL RELEASE;  Surgeon: Schuyler Amor, MD;  Location: [facility];  Service: Orthopedics;  Laterality: Right;  . Left heart catheterization with coronary angiogram N/A 05/16/2013    Procedure: LEFT HEART CATHETERIZATION WITH CORONARY ANGIOGRAM;  Surgeon: Clent Demark, MD;  Location: [facility] CATH LAB;  Service: Cardiovascular;  Laterality: N/A;     Family History  Problem Relation Age of Onset  . Cancer - Other Mother   . CAD Mother   . Arthritis Mother   . CVA Sister   . CAD Sister   . Seizures Brother      History   Social History  . Marital Status: Widowed    Spouse Name: N/A    Number of Children: N/A  . Years of Education: N/A   Occupational History  . Not on file.   Social History Main Topics  . Smoking status: Never Smoker   . Smokeless tobacco: Never Used  . Alcohol Use: No  . Drug Use: No  . Sexual Activity: Not on file   Other Topics Concern  . Not on file   Social History Narrative     BP 130/84 mmHg  Pulse 62  Ht 5\' 4"  (1.626 m)  Wt 161 lb 3.2 oz (73.12 kg)  BMI 27.66 kg/m2  Physical Exam:  Well appearing 69 yo man, NAD HEENT: Unremarkable Neck: 7 cm JVD, no thyromegally Lungs:  Clear except for basilar rales. No wheezes or rhonchi. Well-healed ICD incision. HEART:  Regular rate rhythm, grade 2/6 systolic murmur, no rubs, no clicks Abd:  soft, positive bowel sounds, no  organomegally, no rebound, no guarding Ext:  2 plus pulses, no edema, no cyanosis, no clubbing Skin:  No rashes no nodules Neuro:  CN II through XII intact, motor grossly intact  DEVICE  Normal device function.  See PaceArt for details.   Assess/Plan:

## 2014-11-12 ENCOUNTER — Encounter: Payer: Self-pay | Admitting: Internal Medicine

## 2015-01-06 ENCOUNTER — Other Ambulatory Visit: Payer: Self-pay | Admitting: Internal Medicine

## 2015-01-15 DIAGNOSIS — I480 Paroxysmal atrial fibrillation: Secondary | ICD-10-CM | POA: Diagnosis not present

## 2015-01-15 DIAGNOSIS — J029 Acute pharyngitis, unspecified: Secondary | ICD-10-CM | POA: Diagnosis not present

## 2015-01-15 DIAGNOSIS — M1990 Unspecified osteoarthritis, unspecified site: Secondary | ICD-10-CM | POA: Diagnosis not present

## 2015-01-15 DIAGNOSIS — E785 Hyperlipidemia, unspecified: Secondary | ICD-10-CM | POA: Diagnosis not present

## 2015-01-15 DIAGNOSIS — I509 Heart failure, unspecified: Secondary | ICD-10-CM | POA: Diagnosis not present

## 2015-01-15 DIAGNOSIS — I429 Cardiomyopathy, unspecified: Secondary | ICD-10-CM | POA: Diagnosis not present

## 2015-01-15 DIAGNOSIS — I10 Essential (primary) hypertension: Secondary | ICD-10-CM | POA: Diagnosis not present

## 2015-01-15 DIAGNOSIS — E039 Hypothyroidism, unspecified: Secondary | ICD-10-CM | POA: Diagnosis not present

## 2015-02-04 ENCOUNTER — Encounter: Payer: Self-pay | Admitting: Family

## 2015-02-04 ENCOUNTER — Ambulatory Visit (INDEPENDENT_AMBULATORY_CARE_PROVIDER_SITE_OTHER): Payer: Medicare Other | Admitting: Family

## 2015-02-04 ENCOUNTER — Other Ambulatory Visit (INDEPENDENT_AMBULATORY_CARE_PROVIDER_SITE_OTHER): Payer: Medicare Other

## 2015-02-04 VITALS — BP 170/98 | HR 61 | Temp 98.8°F | Resp 18 | Ht 64.0 in | Wt 168.4 lb

## 2015-02-04 DIAGNOSIS — M419 Scoliosis, unspecified: Secondary | ICD-10-CM | POA: Insufficient documentation

## 2015-02-04 DIAGNOSIS — I10 Essential (primary) hypertension: Secondary | ICD-10-CM | POA: Diagnosis not present

## 2015-02-04 DIAGNOSIS — E039 Hypothyroidism, unspecified: Secondary | ICD-10-CM

## 2015-02-04 LAB — TSH: TSH: 3.65 u[IU]/mL (ref 0.35–4.50)

## 2015-02-04 NOTE — Assessment & Plan Note (Signed)
Patient continues to experience pain from scoliosis in her cervical and lumbar regions. Continue current treatment regimen of heat, Tylenol, and ibuprofen as needed. Refer to back specialist for further evaluation. Follow-up as needed.

## 2015-02-04 NOTE — Assessment & Plan Note (Signed)
Hypertension appears labile with today's elevation in blood pressure. Continue current regimen of metoprolol and losartan. Given previous readings of 130s, we'll defer increases in medication at this time. Recheck next office visit. If blood pressure remains high adjustment may be necessary.

## 2015-02-04 NOTE — Progress Notes (Signed)
Subjective:    Patient ID: [name], female    DOB: 1946-06-15, 69 y.o.   MRN: [id number]  Chief Complaint  Patient presents with  . Establish Care    has scoleosis and gets very bad pain in her back and legs, she does not currently have a doctor that takes care of her back,     HPI:  [name] is a 69 y.o. female who presents today to establish care and discuss her medical conditions.   1) Arthritis and Scoliosis - Previously diagnosed with arthritis and scoliosis. Associated symptom pain in her neck and back located on the right side of her neck and lower lumbar spine that has been going on for a long time . Indicates the pain effects her functionality and sometimes it prohibits her from social activity like going out to eat. Indicates that she is able to complete her ADLs. Pain is described as achy with the occasional sharp with a severity of ranging on average around a 6/10 with increases to 8/10. The 6/10 is without medication. She does take a tylenol and BC powder which helps with her pain. She does take Roxicet which helps on occasion. She was last seen several years ago by a back specialist.   2) Hypothyroid - Stable with current regimen of levothyroxine.   Lab Results  Component Value Date   TSH 3.65 02/04/2015    3) Hypertension - Currently followed by Cardiology and maintained on losartan and metoprolol. Denies any adverse effects of medication.   BP Readings from Last 3 Encounters:  02/04/15 170/98  11/03/14 130/84  03/05/14 132/56   Allergies  Allergen Reactions  . Morphine And Related Itching and Nausea And Vomiting  . Codeine Nausea And Vomiting  . Meperidine Hcl Nausea And Vomiting    Current Outpatient Prescriptions on File Prior to Visit  Medication Sig Dispense Refill  . acetaminophen (TYLENOL) 500 MG tablet Take 1,000 mg by mouth every 6 (six) hours as needed. For pain    . amiodarone (PACERONE) 200 MG tablet Take 1 tablet (200 mg total) by mouth  daily. 60 tablet 1  . aspirin EC 81 MG tablet Take 81 mg by mouth every other day.    Marland Kitchen aspirin-acetaminophen-caffeine (EXCEDRIN MIGRAINE) 250-250-65 MG per tablet Take 1 tablet by mouth every 6 (six) hours as needed for headache.    . diazepam (VALIUM) 2 MG tablet Take 2 mg by mouth 2 (two) times daily.     . fish oil-omega-3 fatty acids 1000 MG capsule Take 4 capsules by mouth daily.     Marland Kitchen ibuprofen (ADVIL,MOTRIN) 400 MG tablet Take 1 tablet (400 mg total) by mouth every 6 (six) hours as needed. (Patient taking differently: Take 400 mg by mouth every 6 (six) hours as needed (pain). ) 30 tablet 0  . levothyroxine (SYNTHROID, LEVOTHROID) 112 MCG tablet Take 112 mcg by mouth daily before breakfast.    . losartan (COZAAR) 100 MG tablet Take 1 tablet by mouth daily.    . metoprolol succinate (TOPROL-XL) 50 MG 24 hr tablet Take 50 mg by mouth daily. Take with or immediately following a meal.    . OVER THE COUNTER MEDICATION Place 1 spray into both nostrils daily as needed (nasal congestion). Nasal Spray    . oxyCODONE-acetaminophen (ROXICET) 5-325 MG per tablet Take 1 tablet by mouth every 4 (four) hours as needed for severe pain. 30 tablet 0  . pantoprazole (PROTONIX) 40 MG tablet Take 40 mg by mouth daily.    Marland Kitchen  PARoxetine (PAXIL) 30 MG tablet Take 60 mg by mouth daily.     . Polyvinyl Alcohol-Povidone (MURINE TEARS FOR DRY EYES OP) Place 2 drops into both eyes 2 (two) times daily as needed. For dry eyes    . pravastatin (PRAVACHOL) 40 MG tablet TAKE ONE TABLET BY MOUTH EACH EVENING AS DIRECTED 30 tablet 11   No current facility-administered medications on file prior to visit.    Past Medical History  Diagnosis Date  . Arthritis   . Hypertension   . Paroxysmal atrial fibrillation   . HLD (hyperlipidemia)   . Hypothyroidism   . Depression   . CHF (congestive heart failure) 05/2013    class III, severe  . GERD (gastroesophageal reflux disease)   . Severe mitral regurgitation 05/2013    s/p MV  repair with annuloplasty  . Anxiety   . Heart murmur   . Headache(784.0)     migraines  . Scoliosis   . Pneumonia   . History of sudden cardiac arrest successfully resuscitated 2011  . Automatic implantable cardioverter-defibrillator in situ     Past Surgical History  Procedure Laterality Date  . Cardiac defibrillator placement  2010    Vfib arrest, single chamber ICD  . Abdominal hysterectomy    . Hernia repair  as child    x 2  . Tee without cardioversion  09/24/2012    Procedure: TRANSESOPHAGEAL ECHOCARDIOGRAM (TEE);  Surgeon: Fay Records, MD;  Location: [facility] ENDOSCOPY;  Service: Cardiovascular;  Laterality: N/A;  . Multiple extractions with alveoloplasty N/A 05/21/2013    Procedure: Extraction of tooth #'s 2,4,18 with alveoloplasty and gross debridement of remaining teeth;  Surgeon: Lenn Cal, DDS;  Location: [place];  Service: Oral Surgery;  Laterality: N/A;  . Mitral valve repair N/A 05/24/2013    Surgeon: Ivin Poot, MD; Service: Open Heart Surgery  . Intraoperative transesophageal echocardiogram N/A 05/24/2013    Procedure: INTRAOPERATIVE TRANSESOPHAGEAL ECHOCARDIOGRAM;  Surgeon: Ivin Poot, MD;  Location: [place];  Service: Open Heart Surgery;  Laterality: N/A;  . Appendectomy    . Cardiac catheterization    . Colonoscopy    . Open reduction internal fixation (orif) distal radial fracture Right 03/05/2014    Procedure: OPEN REDUCTION INTERNAL FIXATION (ORIF) RIGHT DISTAL RADIUS FRACTURE ;  Surgeon: Schuyler Amor, MD;  Location: [place];  Service: Orthopedics;  Laterality: Right;  . Carpal tunnel release Right 03/05/2014    Procedure: RIGHT CARPAL TUNNEL RELEASE;  Surgeon: Schuyler Amor, MD;  Location: [place];  Service: Orthopedics;  Laterality: Right;  . Left heart catheterization with coronary angiogram N/A 05/16/2013    Procedure: LEFT HEART CATHETERIZATION WITH CORONARY ANGIOGRAM;  Surgeon: Clent Demark, MD;  Location: [facility] CATH LAB;  Service: Cardiovascular;   Laterality: N/A;    Family History  Problem Relation Age of Onset  . Cancer - Other Mother   . CAD Mother   . Arthritis Mother   . CVA Sister   . CAD Sister   . Seizures Brother   . Heart disease Father     History   Social History  . Marital Status: Widowed    Spouse Name: N/A  . Number of Children: 1  . Years of Education: 9   Occupational History  . Retired    Social History Main Topics  . Smoking status: Never Smoker   . Smokeless tobacco: Never Used  . Alcohol Use: No  . Drug Use: No  . Sexual Activity: Yes  Birth Control/ Protection: Surgical   Other Topics Concern  . Not on file   Social History Narrative   Fun: Shop, read, dance   Denies religious beliefs effecting health care.      Review of Systems  Constitutional: Negative for fatigue.  Respiratory: Negative for chest tightness and shortness of breath.   Cardiovascular: Negative for chest pain, palpitations and leg swelling.  Endocrine: Negative for cold intolerance and heat intolerance.  Musculoskeletal: Positive for back pain and neck pain.      Objective:    BP 170/98 mmHg  Pulse 61  Temp(Src) 98.8 F (37.1 C) (Oral)  Resp 18  Ht 5\' 4"  (1.626 m)  Wt 168 lb 6.4 oz (76.386 kg)  BMI 28.89 kg/m2  SpO2 97% Nursing note and vital signs reviewed.  Physical Exam  Constitutional: She is oriented to person, place, and time. She appears well-developed and well-nourished. No distress.  Neck: Normal range of motion. Neck supple. No thyromegaly present.  Cardiovascular: Normal rate, regular rhythm, normal heart sounds and intact distal pulses.   Pulmonary/Chest: Effort normal and breath sounds normal.  Musculoskeletal:   No obvious deformity, discoloration, or edema of back noted. Palpable tenderness along C6-C7 and lumbar spine and paraspinal musculature. Mild scoliotic curve noted. Patient displays full range of motion in cervical and lumbar spine. Straight leg raise on the left results in  increased back pain. Distal pulses and sensation are intact and appropriate.                                                          Neurological: She is alert and oriented to person, place, and time.  Skin: Skin is warm and dry.  Psychiatric: She has a normal mood and affect. Her behavior is normal. Judgment and thought content normal.       Assessment & Plan:

## 2015-02-04 NOTE — Assessment & Plan Note (Signed)
Stable with current regimen of levothyroxine. Obtain TSH. Continue current dosage of levothyroxine pending lab results.

## 2015-02-04 NOTE — Progress Notes (Signed)
Pre visit review using our clinic review tool, if applicable. No additional management support is needed unless otherwise documented below in the visit note. 

## 2015-02-04 NOTE — Patient Instructions (Addendum)
Thank you for choosing [facility].  Summary/Instructions:  Please stop by the lab on the basement level of the building for your blood work. Your results will be released to [facility] (or called to you) after review, usually within 72 hours after test completion. If any changes need to be made, you will be notified at that same time.  If your symptoms worsen or fail to improve, please contact our office for further instruction, or in case of emergency go directly to the emergency room at the closest medical facility.   Scoliosis Scoliosis is the name given to a spine that curves sideways.Scoliosis can cause twisting of your shoulders, hips, chest, back, and rib cage.  CAUSES  The cause of scoliosis is not always known. It may be caused by a birth defect or by a disease that can cause muscular dysfunction and imbalance, such as cerebral palsy and muscular dystrophy.  RISK FACTORS Having a disease that causes muscle disease or dysfunction. SIGNS AND SYMPTOMS Scoliosis often has no signs or symptoms.If they are present, they may include:  Unequal size of one body side compared to the other (asymmetry).  Visible curvature of the spine.  Pain. The pain may limit physical activity.  Shortness of breath.  Bowel or bladder issues. DIAGNOSIS A skilled health care provider will perform an evaluation. This will involve:  Taking your history.  Performing a physical examination.  Performing a neurological exam to detect nerve or muscle function loss.  Range of motion studies on the spine.  X-rays. An MRI may also be obtained. TREATMENT  Treatment varies depending on the nature, extent, and severity of the disease. If the curvature is not great, you may need only observation. A brace may be used to prevent scoliosis from progressing. A brace may also be needed during growth spurts. Physical therapy may be of benefit. Surgery may be required.  HOME CARE INSTRUCTIONS   Your health  care provider may suggest exercises to strengthen your muscles. Perform them as directed.  Ask your health care provider before participating in any sports.   If you have been prescribed an orthopedic brace, wear it as instructed by your health care provider. SEEK MEDICAL CARE IF: Your brace causes the skin to become sore (chafe) or is uncomfortable.  SEEK IMMEDIATE MEDICAL CARE IF:  You have back pain that is not relieved by the medicines prescribed by your health care provider.   Your legs feel weak or you lose function in your legs.  You lose some bowel or bladder control.  Document Released: 09/30/2000 Document Revised: 10/08/2013 Document Reviewed: 06/10/2013 [facility] Patient Information 2015 [place], [place]. This information is not intended to replace advice given to you by your health care provider. Make sure you discuss any questions you have with your health care provider.

## 2015-02-05 ENCOUNTER — Telehealth: Payer: Self-pay | Admitting: Family

## 2015-02-05 MED ORDER — LEVOTHYROXINE SODIUM 125 MCG PO TABS: 125.0000 ug | ORAL_TABLET | Freq: Every day | ORAL | Status: DC

## 2015-02-05 NOTE — Telephone Encounter (Signed)
Please inform the patient that her TSH was 3.65. Therefore based on the fact that she is fatigued and having mild symptoms, we can increase her dosage of levothyroxine 1 level. I have sent the new prescription to her pharmacy and would like for her to follow-up with a TSH repeat in about 6 weeks.

## 2015-02-06 NOTE — Telephone Encounter (Signed)
LVM for pt to call back.

## 2015-02-09 NOTE — Telephone Encounter (Signed)
Pt aware of results 

## 2015-02-28 DIAGNOSIS — M5441 Lumbago with sciatica, right side: Secondary | ICD-10-CM | POA: Diagnosis not present

## 2015-02-28 DIAGNOSIS — G8929 Other chronic pain: Secondary | ICD-10-CM | POA: Diagnosis not present

## 2015-02-28 DIAGNOSIS — M5442 Lumbago with sciatica, left side: Secondary | ICD-10-CM | POA: Diagnosis not present

## 2015-04-16 ENCOUNTER — Other Ambulatory Visit: Payer: Self-pay

## 2015-04-16 MED ORDER — LEVOTHYROXINE SODIUM 125 MCG PO TABS: 125.0000 ug | ORAL_TABLET | Freq: Every day | ORAL | Status: DC

## 2015-05-19 ENCOUNTER — Ambulatory Visit: Payer: Medicare Other | Admitting: Family

## 2015-05-26 ENCOUNTER — Ambulatory Visit: Payer: Medicare Other | Admitting: Family

## 2015-06-19 DIAGNOSIS — R0789 Other chest pain: Secondary | ICD-10-CM | POA: Diagnosis not present

## 2015-06-19 DIAGNOSIS — M1990 Unspecified osteoarthritis, unspecified site: Secondary | ICD-10-CM | POA: Diagnosis not present

## 2015-06-19 DIAGNOSIS — I5020 Unspecified systolic (congestive) heart failure: Secondary | ICD-10-CM | POA: Diagnosis not present

## 2015-06-19 DIAGNOSIS — I480 Paroxysmal atrial fibrillation: Secondary | ICD-10-CM | POA: Diagnosis not present

## 2015-06-19 DIAGNOSIS — E785 Hyperlipidemia, unspecified: Secondary | ICD-10-CM | POA: Diagnosis not present

## 2015-06-19 DIAGNOSIS — I429 Cardiomyopathy, unspecified: Secondary | ICD-10-CM | POA: Diagnosis not present

## 2015-06-19 DIAGNOSIS — I10 Essential (primary) hypertension: Secondary | ICD-10-CM | POA: Diagnosis not present

## 2015-06-19 DIAGNOSIS — E039 Hypothyroidism, unspecified: Secondary | ICD-10-CM | POA: Diagnosis not present

## 2015-06-24 ENCOUNTER — Ambulatory Visit (INDEPENDENT_AMBULATORY_CARE_PROVIDER_SITE_OTHER): Payer: Medicare Other | Admitting: *Deleted

## 2015-06-24 DIAGNOSIS — I5022 Chronic systolic (congestive) heart failure: Secondary | ICD-10-CM | POA: Diagnosis not present

## 2015-06-24 DIAGNOSIS — I4901 Ventricular fibrillation: Secondary | ICD-10-CM | POA: Diagnosis not present

## 2015-06-24 DIAGNOSIS — I10 Essential (primary) hypertension: Secondary | ICD-10-CM | POA: Diagnosis not present

## 2015-06-24 DIAGNOSIS — E785 Hyperlipidemia, unspecified: Secondary | ICD-10-CM | POA: Diagnosis not present

## 2015-06-24 DIAGNOSIS — E039 Hypothyroidism, unspecified: Secondary | ICD-10-CM | POA: Diagnosis not present

## 2015-06-24 LAB — CUP PACEART INCLINIC DEVICE CHECK
Battery Remaining Longevity: 67.2 mo
Date Time Interrogation Session: 20160907160715
HIGH POWER IMPEDANCE MEASURED VALUE: 42.6094
Lead Channel Impedance Value: 375 Ohm
Lead Channel Pacing Threshold Amplitude: 1 V
Lead Channel Pacing Threshold Amplitude: 1 V
Lead Channel Sensing Intrinsic Amplitude: 6.1 mV
Lead Channel Setting Sensing Sensitivity: 0.5 mV
MDC IDC MSMT LEADCHNL RV PACING THRESHOLD PULSEWIDTH: 0.4 ms
MDC IDC MSMT LEADCHNL RV PACING THRESHOLD PULSEWIDTH: 0.4 ms
MDC IDC SET LEADCHNL RV PACING AMPLITUDE: 2.5 V
MDC IDC SET LEADCHNL RV PACING PULSEWIDTH: 0.4 ms
MDC IDC SET ZONE DETECTION INTERVAL: 260 ms
MDC IDC STAT BRADY RV PERCENT PACED: 0.03 %
Pulse Gen Serial Number: 745406
Zone Setting Detection Interval: 300 ms

## 2015-06-24 NOTE — Progress Notes (Signed)
ICD check in clinic. Normal device function. Threshold and sensing consistent with previous device measurements. Impedance trends stable over time. No evidence of any ventricular arrhythmias. Histogram distribution appropriate for patient and level of activity. No changes made this session. Device programmed at appropriate safety margins. Device programmed to optimize intrinsic conduction. Estimated longevity 5.3-5.6 years. ROV with GT 10/21/14.

## 2015-07-29 ENCOUNTER — Encounter: Payer: Self-pay | Admitting: Internal Medicine

## 2015-08-31 ENCOUNTER — Ambulatory Visit: Payer: Medicare Other | Admitting: Family

## 2015-08-31 DIAGNOSIS — Z0289 Encounter for other administrative examinations: Secondary | ICD-10-CM

## 2015-10-22 ENCOUNTER — Encounter: Payer: Medicare Other | Admitting: Internal Medicine

## 2015-10-30 DIAGNOSIS — E039 Hypothyroidism, unspecified: Secondary | ICD-10-CM | POA: Diagnosis not present

## 2015-10-30 DIAGNOSIS — I119 Hypertensive heart disease without heart failure: Secondary | ICD-10-CM | POA: Diagnosis not present

## 2015-10-30 DIAGNOSIS — M1990 Unspecified osteoarthritis, unspecified site: Secondary | ICD-10-CM | POA: Diagnosis not present

## 2015-10-30 DIAGNOSIS — E785 Hyperlipidemia, unspecified: Secondary | ICD-10-CM | POA: Diagnosis not present

## 2015-10-30 DIAGNOSIS — I480 Paroxysmal atrial fibrillation: Secondary | ICD-10-CM | POA: Diagnosis not present

## 2015-10-30 DIAGNOSIS — I429 Cardiomyopathy, unspecified: Secondary | ICD-10-CM | POA: Diagnosis not present

## 2015-10-30 DIAGNOSIS — J309 Allergic rhinitis, unspecified: Secondary | ICD-10-CM | POA: Diagnosis not present

## 2015-11-17 ENCOUNTER — Encounter: Payer: Medicare Other | Admitting: Internal Medicine

## 2015-12-08 ENCOUNTER — Other Ambulatory Visit: Payer: Self-pay

## 2015-12-08 ENCOUNTER — Ambulatory Visit (INDEPENDENT_AMBULATORY_CARE_PROVIDER_SITE_OTHER): Payer: Medicare Other | Admitting: Internal Medicine

## 2015-12-08 ENCOUNTER — Encounter: Payer: Self-pay | Admitting: Internal Medicine

## 2015-12-08 VITALS — BP 140/80 | HR 54 | Ht 64.0 in | Wt 173.8 lb

## 2015-12-08 DIAGNOSIS — I4901 Ventricular fibrillation: Secondary | ICD-10-CM | POA: Diagnosis not present

## 2015-12-08 DIAGNOSIS — Z95810 Presence of automatic (implantable) cardiac defibrillator: Secondary | ICD-10-CM | POA: Diagnosis not present

## 2015-12-08 DIAGNOSIS — I5022 Chronic systolic (congestive) heart failure: Secondary | ICD-10-CM

## 2015-12-08 DIAGNOSIS — E785 Hyperlipidemia, unspecified: Secondary | ICD-10-CM

## 2015-12-08 NOTE — Progress Notes (Signed)
HPI [name] returns today for followup. She is a very pleasant 70 year old woman, with coronary disease, mitral regurgitation, chronic systolic heart failure, status post ventricular fibrillation arrest. She is status post ICD implantation.  She does admit to dietary and medical noncompliance but has done better in the past year. Minimal peripheral edema. She denies anginal symptoms. Her heart failure remains class 2. Her main complaint is related to nausea and back pain.  Allergies  Allergen Reactions  . Morphine And Related Itching and Nausea And Vomiting  . Codeine Nausea And Vomiting  . Meperidine Hcl Nausea And Vomiting     Current Outpatient Prescriptions  Medication Sig Dispense Refill  . acetaminophen (TYLENOL) 500 MG tablet Take 1,000 mg by mouth every 6 (six) hours as needed. For pain    . amiodarone (PACERONE) 200 MG tablet Take 1 tablet (200 mg total) by mouth daily. 60 tablet 1  . aspirin EC 81 MG tablet Take 81 mg by mouth every other day.    Marland Kitchen aspirin-acetaminophen-caffeine (EXCEDRIN MIGRAINE) 250-250-65 MG per tablet Take 1 tablet by mouth every 6 (six) hours as needed for headache.    . diazepam (VALIUM) 2 MG tablet Take 2 mg by mouth 2 (two) times daily.     . fish oil-omega-3 fatty acids 1000 MG capsule Take 4 capsules by mouth daily.     . hydrOXYzine (ATARAX/VISTARIL) 10 MG tablet Take 1 tablet by mouth daily.    Marland Kitchen ibuprofen (ADVIL,MOTRIN) 400 MG tablet Take 1 tablet (400 mg total) by mouth every 6 (six) hours as needed. (Patient taking differently: Take 400 mg by mouth every 6 (six) hours as needed (pain). ) 30 tablet 0  . levothyroxine (SYNTHROID, LEVOTHROID) 125 MCG tablet Take 1 tablet (125 mcg total) by mouth daily. 30 tablet 6  . losartan (COZAAR) 100 MG tablet Take 1 tablet by mouth daily.    . metoprolol succinate (TOPROL-XL) 50 MG 24 hr tablet Take 50 mg by mouth daily. Take with or immediately following a meal.    . OVER THE COUNTER MEDICATION Place 1 spray  into both nostrils daily as needed (nasal congestion). Nasal Spray    . oxyCODONE-acetaminophen (ROXICET) 5-325 MG per tablet Take 1 tablet by mouth every 4 (four) hours as needed for severe pain. 30 tablet 0  . pantoprazole (PROTONIX) 40 MG tablet Take 40 mg by mouth daily.    Marland Kitchen PARoxetine (PAXIL) 30 MG tablet Take 60 mg by mouth daily.     . Polyvinyl Alcohol-Povidone (MURINE TEARS FOR DRY EYES OP) Place 2 drops into both eyes 2 (two) times daily as needed. For dry eyes    . pravastatin (PRAVACHOL) 40 MG tablet TAKE ONE TABLET BY MOUTH EACH EVENING AS DIRECTED 30 tablet 11   No current facility-administered medications for this visit.     Past Medical History  Diagnosis Date  . Arthritis   . Hypertension   . Paroxysmal atrial fibrillation (HCC)   . HLD (hyperlipidemia)   . Hypothyroidism   . Depression   . CHF (congestive heart failure) ([place]) 05/2013    class III, severe  . GERD (gastroesophageal reflux disease)   . Severe mitral regurgitation 05/2013    s/p MV repair with annuloplasty  . Anxiety   . Heart murmur   . Headache(784.0)     migraines  . Scoliosis   . Pneumonia   . History of sudden cardiac arrest successfully resuscitated 2011  . Automatic implantable cardioverter-defibrillator in situ  ROS:   All systems reviewed and negative except as noted in the HPI.   Past Surgical History  Procedure Laterality Date  . Cardiac defibrillator placement  2010    Vfib arrest, single chamber ICD  . Abdominal hysterectomy    . Hernia repair  as child    x 2  . Tee without cardioversion  09/24/2012    Procedure: TRANSESOPHAGEAL ECHOCARDIOGRAM (TEE);  Surgeon: Fay Records, MD;  Location: [facility] ENDOSCOPY;  Service: Cardiovascular;  Laterality: N/A;  . Multiple extractions with alveoloplasty N/A 05/21/2013    Procedure: Extraction of tooth #'s 2,4,18 with alveoloplasty and gross debridement of remaining teeth;  Surgeon: Lenn Cal, DDS;  Location: [place];  Service: Oral  Surgery;  Laterality: N/A;  . Mitral valve repair N/A 05/24/2013    Surgeon: Ivin Poot, MD; Service: Open Heart Surgery  . Intraoperative transesophageal echocardiogram N/A 05/24/2013    Procedure: INTRAOPERATIVE TRANSESOPHAGEAL ECHOCARDIOGRAM;  Surgeon: Ivin Poot, MD;  Location: [place];  Service: Open Heart Surgery;  Laterality: N/A;  . Appendectomy    . Cardiac catheterization    . Colonoscopy    . Open reduction internal fixation (orif) distal radial fracture Right 03/05/2014    Procedure: OPEN REDUCTION INTERNAL FIXATION (ORIF) RIGHT DISTAL RADIUS FRACTURE ;  Surgeon: Schuyler Amor, MD;  Location: [place];  Service: Orthopedics;  Laterality: Right;  . Carpal tunnel release Right 03/05/2014    Procedure: RIGHT CARPAL TUNNEL RELEASE;  Surgeon: Schuyler Amor, MD;  Location: [place];  Service: Orthopedics;  Laterality: Right;  . Left heart catheterization with coronary angiogram N/A 05/16/2013    Procedure: LEFT HEART CATHETERIZATION WITH CORONARY ANGIOGRAM;  Surgeon: Clent Demark, MD;  Location: [facility] CATH LAB;  Service: Cardiovascular;  Laterality: N/A;     Family History  Problem Relation Age of Onset  . Cancer - Other Mother   . CAD Mother   . Arthritis Mother   . CVA Sister   . CAD Sister   . Seizures Brother   . Heart disease Father      Social History   Social History  . Marital Status: Widowed    Spouse Name: N/A  . Number of Children: 1  . Years of Education: 9   Occupational History  . Retired    Social History Main Topics  . Smoking status: Never Smoker   . Smokeless tobacco: Never Used  . Alcohol Use: No  . Drug Use: No  . Sexual Activity: Yes    Birth Control/ Protection: Surgical   Other Topics Concern  . Not on file   Social History Narrative   Fun: Shop, read, dance   Denies religious beliefs effecting health care.      BP 140/80 mmHg  Pulse 54  Ht 5\' 4"  (1.626 m)  Wt 173 lb 12.8 oz (78.835 kg)  BMI 29.82 kg/m2  Physical  Exam:  Well appearing 70 yo man, NAD HEENT: Unremarkable Neck: 7 cm JVD, no thyromegally Lungs:  Clear except for basilar rales. No wheezes or rhonchi. Well-healed ICD incision. HEART:  Regular rate rhythm, grade 2/6 systolic murmur, no rubs, no clicks Abd:  soft, positive bowel sounds, no organomegally, no rebound, no guarding Ext:  2 plus pulses, no edema, no cyanosis, no clubbing Skin:  No rashes no nodules Neuro:  CN II through XII intact, motor grossly intact  DEVICE  Normal device function.  See PaceArt for details.   Assess/Plan:  1. VF arrest -  she has had no additional symptoms. 2. ICD - her [employer] device is working normally. Will recheck in several months. 3. Chronic systolic heart failure - her symptoms are class 2. She is limited mostly by her arthritis. She will continue her current meds. I have encouraged the patient to increase her physical activity. 4. Arthritis - she is instructed to increase her activity as she is able and to use NSAIDS judiciously.  Cristopher Peru, M.D.

## 2015-12-08 NOTE — Patient Instructions (Signed)

## 2015-12-09 LAB — CUP PACEART INCLINIC DEVICE CHECK
Date Time Interrogation Session: 20170222075605
Implantable Lead Implant Date: 20101124
Implantable Lead Location: 753860
MDC IDC PG SERIAL: 745406

## 2016-01-19 DIAGNOSIS — E039 Hypothyroidism, unspecified: Secondary | ICD-10-CM | POA: Diagnosis not present

## 2016-01-19 DIAGNOSIS — I119 Hypertensive heart disease without heart failure: Secondary | ICD-10-CM | POA: Diagnosis not present

## 2016-01-19 DIAGNOSIS — I480 Paroxysmal atrial fibrillation: Secondary | ICD-10-CM | POA: Diagnosis not present

## 2016-01-19 DIAGNOSIS — E785 Hyperlipidemia, unspecified: Secondary | ICD-10-CM | POA: Diagnosis not present

## 2016-01-19 DIAGNOSIS — J309 Allergic rhinitis, unspecified: Secondary | ICD-10-CM | POA: Diagnosis not present

## 2016-01-19 DIAGNOSIS — J209 Acute bronchitis, unspecified: Secondary | ICD-10-CM | POA: Diagnosis not present

## 2016-01-19 DIAGNOSIS — M1990 Unspecified osteoarthritis, unspecified site: Secondary | ICD-10-CM | POA: Diagnosis not present

## 2016-03-02 ENCOUNTER — Other Ambulatory Visit: Payer: Self-pay | Admitting: Family

## 2016-04-06 ENCOUNTER — Encounter (HOSPITAL_COMMUNITY): Payer: Self-pay | Admitting: Emergency Medicine

## 2016-04-06 ENCOUNTER — Emergency Department (HOSPITAL_COMMUNITY)
Admission: EM | Admit: 2016-04-06 | Discharge: 2016-04-06 | Disposition: A | Discharge status: Home / Self Care | Payer: Medicare Other | Attending: Emergency Medicine | Admitting: Emergency Medicine

## 2016-04-06 ENCOUNTER — Emergency Department (HOSPITAL_COMMUNITY): Payer: Medicare Other

## 2016-04-06 DIAGNOSIS — Y939 Activity, unspecified: Secondary | ICD-10-CM | POA: Insufficient documentation

## 2016-04-06 DIAGNOSIS — W19XXXA Unspecified fall, initial encounter: Secondary | ICD-10-CM

## 2016-04-06 DIAGNOSIS — S3992XA Unspecified injury of lower back, initial encounter: Secondary | ICD-10-CM | POA: Diagnosis not present

## 2016-04-06 DIAGNOSIS — Z7982 Long term (current) use of aspirin: Secondary | ICD-10-CM | POA: Insufficient documentation

## 2016-04-06 DIAGNOSIS — Z79899 Other long term (current) drug therapy: Secondary | ICD-10-CM | POA: Diagnosis not present

## 2016-04-06 DIAGNOSIS — Y929 Unspecified place or not applicable: Secondary | ICD-10-CM | POA: Diagnosis not present

## 2016-04-06 DIAGNOSIS — S300XXA Contusion of lower back and pelvis, initial encounter: Secondary | ICD-10-CM | POA: Insufficient documentation

## 2016-04-06 DIAGNOSIS — I119 Hypertensive heart disease without heart failure: Secondary | ICD-10-CM | POA: Diagnosis not present

## 2016-04-06 DIAGNOSIS — W108XXA Fall (on) (from) other stairs and steps, initial encounter: Secondary | ICD-10-CM | POA: Diagnosis not present

## 2016-04-06 DIAGNOSIS — S20211A Contusion of right front wall of thorax, initial encounter: Secondary | ICD-10-CM | POA: Diagnosis not present

## 2016-04-06 DIAGNOSIS — I110 Hypertensive heart disease with heart failure: Secondary | ICD-10-CM | POA: Diagnosis not present

## 2016-04-06 DIAGNOSIS — M1990 Unspecified osteoarthritis, unspecified site: Secondary | ICD-10-CM | POA: Diagnosis not present

## 2016-04-06 DIAGNOSIS — I509 Heart failure, unspecified: Secondary | ICD-10-CM | POA: Diagnosis not present

## 2016-04-06 DIAGNOSIS — J309 Allergic rhinitis, unspecified: Secondary | ICD-10-CM | POA: Diagnosis not present

## 2016-04-06 DIAGNOSIS — E785 Hyperlipidemia, unspecified: Secondary | ICD-10-CM | POA: Diagnosis not present

## 2016-04-06 DIAGNOSIS — S299XXA Unspecified injury of thorax, initial encounter: Secondary | ICD-10-CM | POA: Diagnosis not present

## 2016-04-06 DIAGNOSIS — I480 Paroxysmal atrial fibrillation: Secondary | ICD-10-CM | POA: Diagnosis not present

## 2016-04-06 DIAGNOSIS — Y999 Unspecified external cause status: Secondary | ICD-10-CM | POA: Insufficient documentation

## 2016-04-06 DIAGNOSIS — Z95810 Presence of automatic (implantable) cardiac defibrillator: Secondary | ICD-10-CM | POA: Diagnosis not present

## 2016-04-06 DIAGNOSIS — M791 Myalgia: Secondary | ICD-10-CM | POA: Diagnosis not present

## 2016-04-06 DIAGNOSIS — E039 Hypothyroidism, unspecified: Secondary | ICD-10-CM | POA: Diagnosis not present

## 2016-04-06 DIAGNOSIS — R0781 Pleurodynia: Secondary | ICD-10-CM | POA: Diagnosis not present

## 2016-04-06 DIAGNOSIS — S20301A Unspecified superficial injuries of right front wall of thorax, initial encounter: Secondary | ICD-10-CM | POA: Diagnosis present

## 2016-04-06 DIAGNOSIS — M545 Low back pain: Secondary | ICD-10-CM | POA: Diagnosis not present

## 2016-04-06 MED ORDER — METOPROLOL SUCCINATE ER 25 MG PO TB24
50.0000 mg | ORAL_TABLET | Freq: Every day | ORAL | Status: DC
  Administered 2016-04-06: 50 mg via ORAL
  Filled 2016-04-06: qty 2

## 2016-04-06 MED ORDER — HYDROCODONE-ACETAMINOPHEN 5-325 MG PO TABS: 1.0000 | ORAL_TABLET | ORAL | Status: DC | PRN

## 2016-04-06 MED ORDER — LOSARTAN POTASSIUM 50 MG PO TABS
100.0000 mg | ORAL_TABLET | Freq: Once | ORAL | Status: AC
  Administered 2016-04-06: 100 mg via ORAL
  Filled 2016-04-06: qty 2

## 2016-04-06 NOTE — ED Notes (Signed)
Pt state she fell on Friday afternoon outside going down steps, pt states she fell down about 3 steps. Pt has bruising to right back and flank area, right forearm and left arm. Pt denies hitting head or any LOC.

## 2016-04-06 NOTE — Discharge Instructions (Signed)
Chest Contusion A chest contusion is a deep bruise on your chest area. Contusions are the result of an injury that caused bleeding under the skin. A chest contusion may involve bruising of the skin, muscles, or ribs. The contusion may turn blue, purple, or yellow. Minor injuries will give you a painless contusion, but more severe contusions may stay painful and swollen for a few weeks. CAUSES  A contusion is usually caused by a blow, trauma, or direct force to an area of the body. SYMPTOMS   Swelling and redness of the injured area.  Discoloration of the injured area.  Tenderness and soreness of the injured area.  Pain. DIAGNOSIS  The diagnosis can be made by taking a history and performing a physical exam. An X-ray, CT scan, or MRI may be needed to determine if there were any associated injuries, such as broken bones (fractures) or internal injuries. TREATMENT  Often, the best treatment for a chest contusion is resting, icing, and applying cold compresses to the injured area. Deep breathing exercises may be recommended to reduce the risk of pneumonia. Over-the-counter medicines may also be recommended for pain control. HOME CARE INSTRUCTIONS   Put ice on the injured area.  Put ice in a plastic bag.  Place a towel between your skin and the bag.  Leave the ice on for 15-20 minutes, 03-04 times a day.  Only take over-the-counter or prescription medicines as directed by your caregiver. Your caregiver may recommend avoiding anti-inflammatory medicines (aspirin, ibuprofen, and naproxen) for 48 hours because these medicines may increase bruising.  Rest the injured area.  Perform deep-breathing exercises as directed by your caregiver.  Stop smoking if you smoke.  Do not lift objects over 5 pounds (2.3 kg) for 3 days or longer if recommended by your caregiver. SEEK IMMEDIATE MEDICAL CARE IF:   You have increased bruising or swelling.  You have pain that is getting worse.  You have  difficulty breathing.  You have dizziness, weakness, or fainting.  You have blood in your urine or stool.  You cough up or vomit blood.  Your swelling or pain is not relieved with medicines. MAKE SURE YOU:   Understand these instructions.  Will watch your condition.  Will get help right away if you are not doing well or get worse.   This information is not intended to replace advice given to you by your health care provider. Make sure you discuss any questions you have with your health care provider.   Document Released: 06/28/2001 Document Revised: 06/27/2012 Document Reviewed: 03/26/2012 Elsevier Interactive Patient Education 2016 [employer]  

## 2016-04-06 NOTE — ED Provider Notes (Addendum)
CSN: [id number]     Arrival date & time 04/06/16  1503 History   First MD Initiated Contact with Patient 04/06/16 1851     Chief Complaint  Patient presents with  . Fall  . Back Pain      HPI Patient reports falling 5 days ago while going down steps outside.  She fell onto her right side and presents the emergency department with complaints of severe ongoing pain in her right lateral chest wall as well as her right lower back and lumbar spine region.  She denies weakness of her arms or legs.  She denies head injury or loss consciousness.  She denies neck pain.  No chest pain or shortness breath.  Denies abdominal pain at this time.  He denies weakness of her lower extremities.  She's been ambulatory since the fall.  She tried over-the-counter medications without improvement in her symptoms   Past Medical History  Diagnosis Date  . Arthritis   . Hypertension   . Paroxysmal atrial fibrillation (HCC)   . HLD (hyperlipidemia)   . Hypothyroidism   . Depression   . CHF (congestive heart failure) ([place]) 05/2013    class III, severe  . GERD (gastroesophageal reflux disease)   . Severe mitral regurgitation 05/2013    s/p MV repair with annuloplasty  . Anxiety   . Heart murmur   . Headache(784.0)     migraines  . Scoliosis   . Pneumonia   . History of sudden cardiac arrest successfully resuscitated 2011  . Automatic implantable cardioverter-defibrillator in situ    Past Surgical History  Procedure Laterality Date  . Cardiac defibrillator placement  2010    Vfib arrest, single chamber ICD  . Abdominal hysterectomy    . Hernia repair  as child    x 2  . Tee without cardioversion  09/24/2012    Procedure: TRANSESOPHAGEAL ECHOCARDIOGRAM (TEE);  Surgeon: Fay Records, MD;  Location: [facility] ENDOSCOPY;  Service: Cardiovascular;  Laterality: N/A;  . Multiple extractions with alveoloplasty N/A 05/21/2013    Procedure: Extraction of tooth #'s 2,4,18 with alveoloplasty and gross debridement of  remaining teeth;  Surgeon: Lenn Cal, DDS;  Location: [place];  Service: Oral Surgery;  Laterality: N/A;  . Mitral valve repair N/A 05/24/2013    Surgeon: Ivin Poot, MD; Service: Open Heart Surgery  . Intraoperative transesophageal echocardiogram N/A 05/24/2013    Procedure: INTRAOPERATIVE TRANSESOPHAGEAL ECHOCARDIOGRAM;  Surgeon: Ivin Poot, MD;  Location: [place];  Service: Open Heart Surgery;  Laterality: N/A;  . Appendectomy    . Cardiac catheterization    . Colonoscopy    . Open reduction internal fixation (orif) distal radial fracture Right 03/05/2014    Procedure: OPEN REDUCTION INTERNAL FIXATION (ORIF) RIGHT DISTAL RADIUS FRACTURE ;  Surgeon: Schuyler Amor, MD;  Location: [place];  Service: Orthopedics;  Laterality: Right;  . Carpal tunnel release Right 03/05/2014    Procedure: RIGHT CARPAL TUNNEL RELEASE;  Surgeon: Schuyler Amor, MD;  Location: [place];  Service: Orthopedics;  Laterality: Right;  . Left heart catheterization with coronary angiogram N/A 05/16/2013    Procedure: LEFT HEART CATHETERIZATION WITH CORONARY ANGIOGRAM;  Surgeon: Clent Demark, MD;  Location: [facility] CATH LAB;  Service: Cardiovascular;  Laterality: N/A;   Family History  Problem Relation Age of Onset  . Cancer - Other Mother   . CAD Mother   . Arthritis Mother   . CVA Sister   . CAD Sister   . Seizures  Brother   . Heart disease Father    Social History  Substance Use Topics  . Smoking status: Never Smoker   . Smokeless tobacco: Never Used  . Alcohol Use: No   OB History    No data available     Review of Systems  All other systems reviewed and are negative.     Allergies  Morphine and related; Codeine; and Meperidine hcl  Home Medications   Prior to Admission medications   Medication Sig Start Date End Date Taking? Authorizing Provider  acetaminophen (TYLENOL) 500 MG tablet Take 1,000 mg by mouth every 6 (six) hours as needed. For pain    Historical Provider, MD  amiodarone  (PACERONE) 200 MG tablet Take 1 tablet (200 mg total) by mouth daily. 11/03/14   Evans Lance, MD  aspirin EC 81 MG tablet Take 81 mg by mouth every other day.    Historical Provider, MD  aspirin-acetaminophen-caffeine (EXCEDRIN MIGRAINE) [phone] MG per tablet Take 1 tablet by mouth every 6 (six) hours as needed for headache.    Historical Provider, MD  diazepam (VALIUM) 2 MG tablet Take 2 mg by mouth 2 (two) times daily.     Historical Provider, MD  fish oil-omega-3 fatty acids 1000 MG capsule Take 4 capsules by mouth daily.     Historical Provider, MD  hydrOXYzine (ATARAX/VISTARIL) 10 MG tablet Take 1 tablet by mouth daily. 11/04/15   Historical Provider, MD  ibuprofen (ADVIL,MOTRIN) 400 MG tablet Take 1 tablet (400 mg total) by mouth every 6 (six) hours as needed. Patient taking differently: Take 400 mg by mouth every 6 (six) hours as needed (pain).  02/23/14   Varney Biles, MD  levothyroxine (SYNTHROID, LEVOTHROID) 125 MCG tablet TAKE 1 TABLET BY MOUTH DAILY 03/03/16   [place] Circle, FNP  losartan (COZAAR) 100 MG tablet Take 1 tablet by mouth daily. 10/27/14   Historical Provider, MD  metoprolol succinate (TOPROL-XL) 50 MG 24 hr tablet Take 50 mg by mouth daily. Take with or immediately following a meal.    Historical Provider, MD  OVER THE COUNTER MEDICATION Place 1 spray into both nostrils daily as needed (nasal congestion). Nasal Spray    Historical Provider, MD         pantoprazole (PROTONIX) 40 MG tablet Take 40 mg by mouth daily.    Historical Provider, MD  PARoxetine (PAXIL) 30 MG tablet Take 60 mg by mouth daily.     Historical Provider, MD  Polyvinyl Alcohol-Povidone (MURINE TEARS FOR DRY EYES OP) Place 2 drops into both eyes 2 (two) times daily as needed. For dry eyes    Historical Provider, MD  pravastatin (PRAVACHOL) 40 MG tablet TAKE ONE TABLET BY MOUTH EACH EVENING AS DIRECTED 01/08/15   Evans Lance, MD   BP 168/105 mmHg  Pulse 88  Temp(Src) 97.6 F (36.4 C) (Oral)   Resp 20  SpO2 99% Physical Exam  Constitutional: She is oriented to person, place, and time. She appears well-developed and well-nourished. No distress.  HENT:  Head: Normocephalic and atraumatic.  Eyes: EOM are normal.  Neck: Normal range of motion.  Cardiovascular: Normal rate, regular rhythm and normal heart sounds.   Pulmonary/Chest: Effort normal and breath sounds normal.  Tenderness and bruising of the right lateral chest wall without crepitus.  Abdominal: Soft. She exhibits no distension. There is no tenderness.  Musculoskeletal: Normal range of motion.  Mild bruising and tenderness of the lumbar spine and paralumbar muscles on the right.  No  lumbar spasm noted.  Full range of motion of right hip.  No thoracic point tenderness  Neurological: She is alert and oriented to person, place, and time.  Skin: Skin is warm and dry.  Psychiatric: She has a normal mood and affect. Judgment normal.  Nursing note and vitals reviewed.   ED Course  Procedures (including critical care time) Labs Review Labs Reviewed - No data to display  Imaging Review Dg Ribs Unilateral W/chest Right  04/06/2016  CLINICAL DATA:  Pt fell 5 days ago, generalized right rib pain EXAM: RIGHT RIBS AND CHEST - 3+ VIEW COMPARISON:  06/19/2013 FINDINGS: No definite displaced fracture.  No pneumothorax or effusion. Right subclavian AICD extends towards the right ventricle apex. Previous median sternotomy and valve surgery. Mild cardiomegaly. IMPRESSION: 1. No displaced rib fracture or pneumothorax. Electronically Signed   By: Lucrezia Europe M.D.   On: 04/06/2016 20:48   Dg Lumbar Spine Complete  04/06/2016  CLINICAL DATA:  Pt fell 5 days ago, lower back pain EXAM: LUMBAR SPINE - COMPLETE 4+ VIEW COMPARISON:  Abdomen 08/26/2012 FINDINGS: There is no evidence of lumbar spine fracture. Alignment is normal. Intervertebral disc spaces are maintained. Small anterior endplate spurs [id number]. IMPRESSION: No acute abnormality  Electronically Signed   By: Lucrezia Europe M.D.   On: 04/06/2016 20:52   I have personally reviewed and evaluated these images and lab results as part of my medical decision-making.   EKG Interpretation None      MDM   Final diagnoses:  None    Films without acute fracture.  Likely contusion.  Home with a short course of pain medication.  Repeat abdominal exam is benign.  Discharge home in good condition.    Jola Schmidt, MD 04/07/16 [employer], MD 04/07/16 [phone]

## 2016-04-06 NOTE — ED Notes (Signed)
Patient transported to X-ray 

## 2016-04-14 ENCOUNTER — Other Ambulatory Visit: Payer: Self-pay | Admitting: Internal Medicine

## 2016-04-22 ENCOUNTER — Other Ambulatory Visit: Payer: Self-pay | Admitting: Family

## 2016-04-25 MED ORDER — LEVOTHYROXINE SODIUM 125 MCG PO TABS: 125.0000 ug | ORAL_TABLET | Freq: Every day | ORAL | Status: DC

## 2016-05-10 NOTE — Progress Notes (Signed)
This encounter was created in error - please disregard.

## 2016-06-09 ENCOUNTER — Other Ambulatory Visit: Payer: Self-pay | Admitting: Family

## 2016-06-14 ENCOUNTER — Emergency Department (HOSPITAL_BASED_OUTPATIENT_CLINIC_OR_DEPARTMENT_OTHER)
Admit: 2016-06-14 | Discharge: 2016-06-14 | Disposition: A | Discharge status: Home / Self Care | Payer: Medicare Other | Attending: Student | Admitting: Student

## 2016-06-14 ENCOUNTER — Emergency Department (HOSPITAL_COMMUNITY)
Admission: EM | Admit: 2016-06-14 | Discharge: 2016-06-14 | Disposition: A | Discharge status: Home / Self Care | Payer: Medicare Other | Attending: Emergency Medicine | Admitting: Emergency Medicine

## 2016-06-14 ENCOUNTER — Encounter (HOSPITAL_COMMUNITY): Payer: Self-pay

## 2016-06-14 DIAGNOSIS — Z79899 Other long term (current) drug therapy: Secondary | ICD-10-CM | POA: Diagnosis not present

## 2016-06-14 DIAGNOSIS — I5022 Chronic systolic (congestive) heart failure: Secondary | ICD-10-CM | POA: Insufficient documentation

## 2016-06-14 DIAGNOSIS — N898 Other specified noninflammatory disorders of vagina: Secondary | ICD-10-CM | POA: Diagnosis not present

## 2016-06-14 DIAGNOSIS — I110 Hypertensive heart disease with heart failure: Secondary | ICD-10-CM | POA: Diagnosis not present

## 2016-06-14 DIAGNOSIS — E039 Hypothyroidism, unspecified: Secondary | ICD-10-CM | POA: Diagnosis not present

## 2016-06-14 DIAGNOSIS — Q524 Other congenital malformations of vagina: Secondary | ICD-10-CM | POA: Diagnosis not present

## 2016-06-14 DIAGNOSIS — Z7982 Long term (current) use of aspirin: Secondary | ICD-10-CM | POA: Diagnosis not present

## 2016-06-14 DIAGNOSIS — M79609 Pain in unspecified limb: Secondary | ICD-10-CM

## 2016-06-14 DIAGNOSIS — M79604 Pain in right leg: Secondary | ICD-10-CM | POA: Insufficient documentation

## 2016-06-14 DIAGNOSIS — M25511 Pain in right shoulder: Secondary | ICD-10-CM | POA: Insufficient documentation

## 2016-06-14 DIAGNOSIS — Z95810 Presence of automatic (implantable) cardiac defibrillator: Secondary | ICD-10-CM | POA: Diagnosis not present

## 2016-06-14 LAB — URINALYSIS, ROUTINE W REFLEX MICROSCOPIC
Glucose, UA: NEGATIVE mg/dL
KETONES UR: 15 mg/dL — AB
NITRITE: NEGATIVE
PROTEIN: 30 mg/dL — AB
SPECIFIC GRAVITY, URINE: 1.027 (ref 1.005–1.030)
pH: 5 (ref 5.0–8.0)

## 2016-06-14 LAB — URINE MICROSCOPIC-ADD ON

## 2016-06-14 MED ORDER — HYDROCODONE-ACETAMINOPHEN 5-325 MG PO TABS
1.0000 | ORAL_TABLET | Freq: Once | ORAL | Status: AC
  Administered 2016-06-14: 1 via ORAL
  Filled 2016-06-14: qty 1

## 2016-06-14 MED ORDER — LIDOCAINE HCL 2 % EX GEL
1.0000 "application " | Freq: Once | CUTANEOUS | Status: AC
  Administered 2016-06-14: 1 via TOPICAL
  Filled 2016-06-14: qty 20

## 2016-06-14 NOTE — ED Provider Notes (Signed)
[facility] DEPT Provider Note   CSN: [id number] Arrival date & time: 06/14/16  0904     History   Chief Complaint Chief Complaint  Patient presents with  . Leg Pain    HPI [name] is a 70 y.o. female.  70 yo female pmh significant for arthritis, HTN, CHF presents to the ED this morning for right leg pain and right shoulder pain. Patient states that the pain started yesterday. She had tried nothing for the pain. Moving makes the pain worse. She has never had anything like this before. Patient states the right leg pain is located behind the right knee. She also endorses some swelling of the right leg. Her right shoulder pain is worse with movement. No redness or erythema noted to right arm. Patient denies any recent hospitalization/surgeries, prolonged immobilization, hormone replacement therapy, tobacco use, or prior dvt. Patient does have family history of blood clots. Denies any fever, chill, ha, vision changes, cp, sob, n/v/d, abd pain, change in bowel habits. Patient reports small cyst to vagina that burns when she urinates. Denies any other urinary symptoms. Denies any hematuria, vaginal bleeding, or discharge. Denies any recent trauma. She is able to ambulate with some pain. Shoulder pain is worse when she lifts up her arm.      Past Medical History:  Diagnosis Date  . Anxiety   . Arthritis   . Automatic implantable cardioverter-defibrillator in situ   . CHF (congestive heart failure) ([place]) 05/2013   class III, severe  . Depression   . GERD (gastroesophageal reflux disease)   . Headache(784.0)    migraines  . Heart murmur   . History of sudden cardiac arrest successfully resuscitated 2011  . HLD (hyperlipidemia)   . Hypertension   . Hypothyroidism   . Paroxysmal atrial fibrillation (HCC)   . Pneumonia   . Scoliosis   . Severe mitral regurgitation 05/2013   s/p MV repair with annuloplasty    Patient Active Problem List   Diagnosis Date Noted  . Scoliosis  02/04/2015  . Ventricular fibrillation ([place]) 11/03/2014  . Anxiety state, unspecified 05/15/2013  . Atrial fibrillation ([place]) 05/15/2013  . Mitral regurgitation 09/20/2012  . Chronic systolic heart failure ([place]) 04/20/2010  . Automatic implantable cardioverter-defibrillator in situ 04/20/2010  . Hypothyroidism 12/28/2009  . HYPERLIPIDEMIA 12/28/2009  . Essential hypertension 12/28/2009  . CARDIAC ARRHYTHMIA 12/28/2009  . CHF 12/28/2009  . IBS 12/28/2009    Past Surgical History:  Procedure Laterality Date  . ABDOMINAL HYSTERECTOMY    . APPENDECTOMY    . CARDIAC CATHETERIZATION    . CARDIAC DEFIBRILLATOR PLACEMENT  2010   Vfib arrest, single chamber ICD  . CARPAL TUNNEL RELEASE Right 03/05/2014   Procedure: RIGHT CARPAL TUNNEL RELEASE;  Surgeon: Schuyler Amor, MD;  Location: [place];  Service: Orthopedics;  Laterality: Right;  . COLONOSCOPY    . HERNIA REPAIR  as child   x 2  . INTRAOPERATIVE TRANSESOPHAGEAL ECHOCARDIOGRAM N/A 05/24/2013   Procedure: INTRAOPERATIVE TRANSESOPHAGEAL ECHOCARDIOGRAM;  Surgeon: Ivin Poot, MD;  Location: [place];  Service: Open Heart Surgery;  Laterality: N/A;  . LEFT HEART CATHETERIZATION WITH CORONARY ANGIOGRAM N/A 05/16/2013   Procedure: LEFT HEART CATHETERIZATION WITH CORONARY ANGIOGRAM;  Surgeon: Clent Demark, MD;  Location: [place] CATH LAB;  Service: Cardiovascular;  Laterality: N/A;  . MITRAL VALVE REPAIR N/A 05/24/2013   Surgeon: Ivin Poot, MD; Service: Open Heart Surgery  . MULTIPLE EXTRACTIONS WITH ALVEOLOPLASTY N/A 05/21/2013   Procedure: Extraction of tooth #'s  2,4,18 with alveoloplasty and gross debridement of remaining teeth;  Surgeon: Lenn Cal, DDS;  Location: [place];  Service: Oral Surgery;  Laterality: N/A;  . OPEN REDUCTION INTERNAL FIXATION (ORIF) DISTAL RADIAL FRACTURE Right 03/05/2014   Procedure: OPEN REDUCTION INTERNAL FIXATION (ORIF) RIGHT DISTAL RADIUS FRACTURE ;  Surgeon: Schuyler Amor, MD;  Location: [place];   Service: Orthopedics;  Laterality: Right;  . TEE WITHOUT CARDIOVERSION  09/24/2012   Procedure: TRANSESOPHAGEAL ECHOCARDIOGRAM (TEE);  Surgeon: Fay Records, MD;  Location[facility] ENDOSCOPY;  Service: Cardiovascular;  Laterality: N/A;    OB History    No data available       Home Medications    Prior to Admission medications   Medication Sig Start Date End Date Taking? Authorizing Provider  acetaminophen (TYLENOL) 500 MG tablet Take 1,000 mg by mouth every 6 (six) hours as needed. For pain    Historical Provider, MD  amiodarone (PACERONE) 200 MG tablet Take 1 tablet (200 mg total) by mouth daily. 11/03/14   Evans Lance, MD  aspirin EC 81 MG tablet Take 81 mg by mouth every other day.    Historical Provider, MD  aspirin-acetaminophen-caffeine (EXCEDRIN MIGRAINE) [phone] MG per tablet Take 1 tablet by mouth every 6 (six) hours as needed for headache.    Historical Provider, MD  diazepam (VALIUM) 2 MG tablet Take 2 mg by mouth 2 (two) times daily as needed (for migraines).     Historical Provider, MD  fish oil-omega-3 fatty acids 1000 MG capsule Take 4 capsules by mouth daily.     Historical Provider, MD  HYDROcodone-acetaminophen (NORCO/VICODIN) 5-325 MG tablet Take 1 tablet by mouth every 4 (four) hours as needed for moderate pain. 04/06/16   Jola Schmidt, MD  hydrOXYzine (ATARAX/VISTARIL) 10 MG tablet Take 1 tablet by mouth daily. For allergies 11/04/15   Historical Provider, MD  ibuprofen (ADVIL,MOTRIN) 400 MG tablet Take 1 tablet (400 mg total) by mouth every 6 (six) hours as needed. Patient taking differently: Take 400 mg by mouth every 6 (six) hours as needed (pain).  02/23/14   Varney Biles, MD  levothyroxine (SYNTHROID, LEVOTHROID) 125 MCG tablet TAKE 1 TABLET BY MOUTH DAILY 06/10/16   Gold[place] Circle, FNP  losartan (COZAAR) 100 MG tablet Take 1 tablet by mouth daily. 10/27/14   Historical Provider, MD  metoprolol succinate (TOPROL-XL) 50 MG 24 hr tablet Take 50 mg by mouth daily. Take  with or immediately following a meal.    Historical Provider, MD  OVER THE COUNTER MEDICATION Place 1 spray into both nostrils daily as needed (nasal congestion). Nasal Spray    Historical Provider, MD  pantoprazole (PROTONIX) 40 MG tablet Take 40 mg by mouth daily.    Historical Provider, MD  PARoxetine (PAXIL) 30 MG tablet Take 60 mg by mouth daily.     Historical Provider, MD  Polyvinyl Alcohol-Povidone (MURINE TEARS FOR DRY EYES OP) Place 2 drops into both eyes 2 (two) times daily as needed. For dry eyes    Historical Provider, MD  pravastatin (PRAVACHOL) 40 MG tablet TAKE 1 TABLET BY MOUTH EVERY EVENING AS DIRECTED. 04/15/16   Evans Lance, MD    Family History Family History  Problem Relation Age of Onset  . Cancer - Other Mother   . CAD Mother   . Arthritis Mother   . CVA Sister   . CAD Sister   . Seizures Brother   . Heart disease Father     Social History Social  History  Substance Use Topics  . Smoking status: Never Smoker  . Smokeless tobacco: Never Used  . Alcohol use No     Allergies   Morphine and related; Codeine; Meperidine hcl; and Pollen extract   Review of Systems Review of Systems  Constitutional: Negative for chills, fatigue and fever.  HENT: Negative for congestion, ear pain and sore throat.   Eyes: Negative for pain and visual disturbance.  Respiratory: Negative for cough and shortness of breath.   Cardiovascular: Negative for chest pain and palpitations.  Gastrointestinal: Negative for abdominal pain, diarrhea, nausea and vomiting.  Genitourinary: Positive for dysuria. Negative for flank pain, frequency, hematuria and urgency.  Musculoskeletal: Negative for arthralgias and back pain.  Skin: Negative for color change and rash.  Neurological: Negative for dizziness, seizures, weakness, light-headedness, numbness and headaches.  All other systems reviewed and are negative.    Physical Exam Updated Vital Signs BP 160/87 (BP Location: Right Arm)    Pulse 62   Temp 98.2 F (36.8 C) (Oral)   Resp 18   Ht 5\' 4"  (1.626 m)   Wt 74.4 kg   SpO2 97%   BMI 28.15 kg/m   Physical Exam  Constitutional: She appears well-developed and well-nourished. No distress.  HENT:  Head: Normocephalic and atraumatic.  Nose: Nose normal.  Mouth/Throat: Oropharynx is clear and moist.  Eyes: Conjunctivae are normal. Right eye exhibits no discharge. Left eye exhibits no discharge. No scleral icterus.  Neck: Normal range of motion. Neck supple. No thyromegaly present.  Cardiovascular: Normal rate, regular rhythm, normal heart sounds and intact distal pulses.   Pulmonary/Chest: Effort normal and breath sounds normal.  Abdominal: Soft. Bowel sounds are normal. She exhibits no distension. There is no tenderness. There is no rebound and no guarding.  Genitourinary:    There is lesion on the left labia. There is no tenderness on the left labia.  Genitourinary Comments: Chaperon present for exam  Musculoskeletal: Normal range of motion.  tenderness to the popliteal foss of the right knee, mild swelling of right leg appreciated. DP pulses 2+ bilaterally. Full ROM of right knee. Sensation intact. Skin warm and dry. No erythema noted.  Tenderness to palpation of the right shoulder joint. Full ROM. Positive Neers test. No deformity or step off noted. No crepitus. No erythema or edema noted. Strength 5/5 on right and 5/5 on left. Radial pulses 2+ bilaterally.   Lymphadenopathy:    She has no cervical adenopathy.       Right: No inguinal adenopathy present.       Left: No inguinal adenopathy present.  Neurological: She is alert.  Skin: Skin is warm and dry. Capillary refill takes less than 2 seconds.  Redness noted to left arm consistent with sunburn.   Vitals reviewed.    ED Treatments / Results  Labs (all labs ordered are listed, but only abnormal results are displayed) Labs Reviewed  URINALYSIS, ROUTINE W REFLEX MICROSCOPIC (NOT AT [facility]) - Abnormal;  Notable for the following:       Result Value   Color, Urine AMBER (*)    APPearance CLOUDY (*)    Hgb urine dipstick LARGE (*)    Bilirubin Urine SMALL (*)    Ketones, ur 15 (*)    Protein, ur 30 (*)    Leukocytes, UA MODERATE (*)    All other components within normal limits  URINE MICROSCOPIC-ADD ON - Abnormal; Notable for the following:    Squamous Epithelial / LPF 0-5 (*)  Bacteria, UA RARE (*)    All other components within normal limits  URINE CULTURE    EKG  EKG Interpretation None       Radiology No results found.    Procedures Procedures (including critical care time)  Medications Ordered in ED Medications  HYDROcodone-acetaminophen (NORCO/VICODIN) 5-325 MG per tablet 1 tablet (1 tablet Oral Given 06/14/16 1156)  HYDROcodone-acetaminophen (NORCO/VICODIN) 5-325 MG per tablet 1 tablet (1 tablet Oral Given 06/14/16 1520)  lidocaine (XYLOCAINE) 2 % jelly 1 application (1 application Topical Given 06/14/16 1525)     Initial Impression / Assessment and Plan / ED Course  I have reviewed the triage vital signs and the nursing notes.  Pertinent labs & imaging results that were available during my care of the patient were reviewed by me and considered in my medical decision making (see chart for details).  Clinical Course  Patient presents with right shoulder pain and right knee pain along with cyst on labia that causes dysuria. Vascular US of right lower extremity shows no signs of dvt or baker cyst. No recent trauma. Able to ambulate. Shoulder pain is located over the joint. Positive Neer's test of right shoulder. No recent trauma. Likely rotator cuff in nature. Patient encouraged to follow up with primary care doctor for further workup of shoulder and knee pain. Pain medicine given in the ED that reduced pain. Pelvic exam showed cyst to left labia. Likely bartholin cyst. [facility] Lidocaine jelly applied  to cyst before discharge. UA showed blood, leukocytes, ketones, protein, bacteria. Culture sent will call patient prescribe antibiotics if positive. Upon further review of UAs in the past these have been present. Patient denies any urinary symptoms. Denies any flank pain. Patient encouraged to follow-up with PCP outpatient for further workup. Dr. Kathrynn Humble evaluated patient and agrees with plan of care. Patient verbalized understanding of plan of care. Patient is afebrile. BP slightly elevated patient did not take blood pressure medicines this morning. Denies headache or vision changes. Discharge home in no acute distress with stable vital signs.  Final Clinical Impressions(s) / ED Diagnoses   Final diagnoses:  Right leg pain  Right shoulder pain  Vaginal cyst    New Prescriptions New Prescriptions   No medications on file     Doristine Devoid, PA-C 06/14/16 1552    Varney Biles, MD 06/15/16 2056

## 2016-06-14 NOTE — Discharge Instructions (Signed)
Schedule an appointment with your primary care doctor to follow up with leg pain and shoulder pain along with urine findings. [facility] May take ibuprofen and tylenol for pain. Urine cultures pending if positive will call you with results and prescribe antibiotics if needed.Return to Ed if symptoms worsen.

## 2016-06-14 NOTE — Progress Notes (Signed)
VASCULAR LAB PRELIMINARY  PRELIMINARY  PRELIMINARY  PRELIMINARY  Right lower extremity venous duplex  Preliminary report:  Right:  No evidence of DVT, superficial thrombosis, or Baker's cyst.  Demyah Smyre, RVS 06/14/2016, 12:58 PM

## 2016-06-14 NOTE — ED Notes (Addendum)
Patient states R arm and R leg pain that started yesterday at 600pm.   Patient states having trouble walking because of the pain. No other neuro deficits.   No code stroke called.

## 2016-06-14 NOTE — ED Triage Notes (Addendum)
Pt arrives w/c with c/o pain at right since last night. Pt states she was shopping with daughter yesterday. C/o pain at right knee and behind right knee. Distal pulses positive.

## 2016-06-14 NOTE — ED Notes (Signed)
Pt states she understands instructions. Home stable with family\.

## 2016-06-14 NOTE — ED Notes (Signed)
Patient transported to Ultrasound 

## 2016-06-14 NOTE — ED Notes (Signed)
Pt in stirrups for exam.

## 2016-06-15 LAB — URINE CULTURE: Culture: <10000 — AB

## 2016-06-21 ENCOUNTER — Encounter: Payer: Self-pay | Admitting: Family

## 2016-06-21 ENCOUNTER — Ambulatory Visit (INDEPENDENT_AMBULATORY_CARE_PROVIDER_SITE_OTHER)
Admission: RE | Admit: 2016-06-21 | Discharge: 2016-06-21 | Disposition: A | Discharge status: Home / Self Care | Payer: Medicare Other | Source: Ambulatory Visit | Attending: Family | Admitting: Family

## 2016-06-21 ENCOUNTER — Ambulatory Visit (INDEPENDENT_AMBULATORY_CARE_PROVIDER_SITE_OTHER): Payer: Medicare Other | Admitting: Family

## 2016-06-21 DIAGNOSIS — M25511 Pain in right shoulder: Secondary | ICD-10-CM | POA: Diagnosis not present

## 2016-06-21 DIAGNOSIS — G8929 Other chronic pain: Secondary | ICD-10-CM | POA: Insufficient documentation

## 2016-06-21 DIAGNOSIS — M179 Osteoarthritis of knee, unspecified: Secondary | ICD-10-CM | POA: Diagnosis not present

## 2016-06-21 DIAGNOSIS — M25561 Pain in right knee: Secondary | ICD-10-CM | POA: Diagnosis not present

## 2016-06-21 DIAGNOSIS — M25562 Pain in left knee: Secondary | ICD-10-CM

## 2016-06-21 MED ORDER — HYDROCODONE-ACETAMINOPHEN 5-325 MG PO TABS: 1.0000 | ORAL_TABLET | Freq: Four times a day (QID) | ORAL | 0 refills | Status: DC | PRN

## 2016-06-21 MED ORDER — FLUCONAZOLE 150 MG PO TABS: 150.0000 mg | ORAL_TABLET | Freq: Once | ORAL | 0 refills | Status: AC

## 2016-06-21 NOTE — Assessment & Plan Note (Signed)
Right shoulder pain with concern for osteoarthritis and possible rotator cuff tear. Obtain x-rays to rule out calcific tendinitis and determine status of osteoarthritis of present. Patient declines cortisone injection today. May require a CT scan to determine extent of pathology as patient is unable to have MRI. Treat conservatively with ice, elevation, and continue hydrocodone-acetaminophen as needed for pain. Follow-up pending x-ray results with referral to orthopedics placed.

## 2016-06-21 NOTE — Assessment & Plan Note (Signed)
Atraumatic knee pain with concern for osteoarthritis. Obtain x-ray. Continue current dosage of hydrocodone-acetaminophen. Continue nonpharmacological treatments including ice and over-the-counter anti-inflammatories as needed. Elevation as tolerated. Declines cortisone injection. Referral to orthopedics place for further assessment and treatment.

## 2016-06-21 NOTE — Progress Notes (Signed)
Subjective:    Patient ID: [name], female    DOB: May 11, 1946, 70 y.o.   MRN: [id number]  Chief Complaint  Patient presents with  . Hospitalization Follow-up    right shoulder and leg pain, states that it has gotten worse, vaginal cyst that she was told to follow up with primary care with, was told to get referral to ortho and pain management    HPI:  [name] is a 70 y.o. female who  has a past medical history of Anxiety; Arthritis; Automatic implantable cardioverter-defibrillator in situ; CHF (congestive heart failure) ([place]) (05/2013); Depression; GERD (gastroesophageal reflux disease); Headache(784.0); Heart murmur; History of sudden cardiac arrest successfully resuscitated (2011); HLD (hyperlipidemia); Hypertension; Hypothyroidism; Paroxysmal atrial fibrillation ([place]); Pneumonia; Scoliosis; and Severe mitral regurgitation (05/2013). and presents today for a follow up office visit.   1.) Right shoulder / leg pain - Recently evaluated in the emergency department for new onset pain located in her right shoulder and right knee. Denies any trauma/injury or sounds/sensations heard or felt. Intravascular ultrasound of the right lower extremity showed no evidence of DVT or Baker's cyst. She was able to ambulate in the emergency department without complication. She was noted to have a positive Neer's impingement sign and designated likely rotator cuff. She was given pain medication and instructed to follow-up with primary care/orthopedics/possible pain management. She was given a prescription for hydrocodone-acetaminophen. No imaging noted. All ED records reviewed in detail.  Since leaving the emergency room she continues to experience the associated symptom of pain located in her right shoulder and knee that is worsening since initial onset. Reports taking the hydrocodone-acetaminophen as prescribed and denies adverse side effects or constipation with minimal relief with medication. Pain in the  right knee is described as achy and constant. Orginally when she tried to walk there were extremely sharp pains that has improved and is able to ambulate. Modifying factors include ice and massage which do help. Also taking OTC NSAIDs. Aggravated with motion and hurts to bend. Pain in the shoulder is described as an achy. Pain is generally constant with no worse than the other. The severity is enough to effect her sleep. Has had limited range of motion. Pain is located in the subacromial space and lateral. No numbness or tingling.    Allergies  Allergen Reactions  . Morphine And Related Itching and Nausea And Vomiting  . Codeine Nausea And Vomiting  . Meperidine Hcl Nausea And Vomiting  . Pollen Extract Itching and Other (See Comments)    Seasonal allergies      Outpatient Medications Prior to Visit  Medication Sig Dispense Refill  . acetaminophen (TYLENOL) 500 MG tablet Take 1,000 mg by mouth every 6 (six) hours as needed. For pain    . amiodarone (PACERONE) 200 MG tablet Take 1 tablet (200 mg total) by mouth daily. 60 tablet 1  . aspirin EC 81 MG tablet Take 81 mg by mouth every other day.    Marland Kitchen aspirin-acetaminophen-caffeine (EXCEDRIN MIGRAINE) 250-250-65 MG per tablet Take 1 tablet by mouth every 6 (six) hours as needed for headache.    . diazepam (VALIUM) 2 MG tablet Take 2 mg by mouth 2 (two) times daily as needed (for migraines).     . fish oil-omega-3 fatty acids 1000 MG capsule Take 4 capsules by mouth daily.     . hydrOXYzine (ATARAX/VISTARIL) 10 MG tablet Take 1 tablet by mouth daily. For allergies    . ibuprofen (ADVIL,MOTRIN) 400 MG tablet Take 1 tablet (  400 mg total) by mouth every 6 (six) hours as needed. (Patient taking differently: Take 400 mg by mouth every 6 (six) hours as needed (pain). ) 30 tablet 0  . levothyroxine (SYNTHROID, LEVOTHROID) 125 MCG tablet TAKE 1 TABLET BY MOUTH DAILY 30 tablet 1  . losartan (COZAAR) 100 MG tablet Take 1 tablet by mouth daily.    .  metoprolol succinate (TOPROL-XL) 50 MG 24 hr tablet Take 50 mg by mouth daily. Take with or immediately following a meal.    . OVER THE COUNTER MEDICATION Place 1 spray into both nostrils daily as needed (nasal congestion). Nasal Spray    . pantoprazole (PROTONIX) 40 MG tablet Take 40 mg by mouth daily.    Marland Kitchen PARoxetine (PAXIL) 30 MG tablet Take 60 mg by mouth daily.     . Polyvinyl Alcohol-Povidone (MURINE TEARS FOR DRY EYES OP) Place 2 drops into both eyes 2 (two) times daily as needed. For dry eyes    . pravastatin (PRAVACHOL) 40 MG tablet TAKE 1 TABLET BY MOUTH EVERY EVENING AS DIRECTED. 90 tablet 1  . HYDROcodone-acetaminophen (NORCO/VICODIN) 5-325 MG tablet Take 1 tablet by mouth every 4 (four) hours as needed for moderate pain. 15 tablet 0   No facility-administered medications prior to visit.       Past Surgical History:  Procedure Laterality Date  . ABDOMINAL HYSTERECTOMY    . APPENDECTOMY    . CARDIAC CATHETERIZATION    . CARDIAC DEFIBRILLATOR PLACEMENT  2010   Vfib arrest, single chamber ICD  . CARPAL TUNNEL RELEASE Right 03/05/2014   Procedure: RIGHT CARPAL TUNNEL RELEASE;  Surgeon: Schuyler Amor, MD;  Location: [facility];  Service: Orthopedics;  Laterality: Right;  . COLONOSCOPY    . HERNIA REPAIR  as child   x 2  . INTRAOPERATIVE TRANSESOPHAGEAL ECHOCARDIOGRAM N/A 05/24/2013   Procedure: INTRAOPERATIVE TRANSESOPHAGEAL ECHOCARDIOGRAM;  Surgeon: Ivin Poot, MD;  Location: [facility];  Service: Open Heart Surgery;  Laterality: N/A;  . LEFT HEART CATHETERIZATION WITH CORONARY ANGIOGRAM N/A 05/16/2013   Procedure: LEFT HEART CATHETERIZATION WITH CORONARY ANGIOGRAM;  Surgeon: Clent Demark, MD;  Location: [facility] CATH [facility];  Service: Cardiovascular;  Laterality: N/A;  . MITRAL VALVE REPAIR N/A 05/24/2013   Surgeon: Ivin Poot, MD; Service: Open Heart Surgery  . MULTIPLE EXTRACTIONS WITH ALVEOLOPLASTY N/A 05/21/2013   Procedure: Extraction of tooth #'s 2,4,18 with alveoloplasty and  gross debridement of remaining teeth;  Surgeon: Lenn Cal, DDS;  Location: [facility];  Service: Oral Surgery;  Laterality: N/A;  . OPEN REDUCTION INTERNAL FIXATION (ORIF) DISTAL RADIAL FRACTURE Right 03/05/2014   Procedure: OPEN REDUCTION INTERNAL FIXATION (ORIF) RIGHT DISTAL RADIUS FRACTURE ;  Surgeon: Schuyler Amor, MD;  Location: [facility];  Service: Orthopedics;  Laterality: Right;  . TEE WITHOUT CARDIOVERSION  09/24/2012   Procedure: TRANSESOPHAGEAL ECHOCARDIOGRAM (TEE);  Surgeon: Fay Records, MD;  Location: [facility];  Service: Cardiovascular;  Laterality: N/A;      Past Medical History:  Diagnosis Date  . Anxiety   . Arthritis   . Automatic implantable cardioverter-defibrillator in situ   . CHF (congestive heart failure) ([place]) 05/2013   class III, severe  . Depression   . GERD (gastroesophageal reflux disease)   . Headache(784.0)    migraines  . Heart murmur   . History of sudden cardiac arrest successfully resuscitated 2011  . HLD (hyperlipidemia)   . Hypertension   . Hypothyroidism   . Paroxysmal atrial fibrillation ([facility])   .  Pneumonia   . Scoliosis   . Severe mitral regurgitation 05/2013   s/p MV repair with annuloplasty      Review of Systems  Constitutional: Negative for chills and fever.  Musculoskeletal: Positive for back pain and neck pain.  Neurological: Positive for weakness. Negative for numbness.      Objective:    BP (!) 182/80 (BP Location: Left Arm, Patient Position: Sitting, Cuff Size: Normal)   Pulse (!) 53   Temp 98.6 F (37 C) (Oral)   Resp 18   Ht 5\' 4"  (1.626 m)   Wt 167 lb (75.8 kg)   SpO2 97%   BMI 28.67 kg/m  Nursing note and vital signs reviewed.  Physical Exam  Constitutional: She is oriented to person, place, and time. She appears well-developed and well-nourished. No distress.  Cardiovascular: Normal rate, regular rhythm, normal heart sounds and intact distal pulses.   Pulmonary/Chest: Effort normal and breath sounds  normal.  Musculoskeletal:  Right knee - no obvious deformity or discoloration with mild edema located on bilateral joint lines anteriorly. There is diffuse tenderness anterior/medial/lateral with no specific point tenderness able to be elicited. Range of motion is within normal limits. Strength is slightly decreased secondary to discomfort. Distal pulses and sensation are intact and appropriate. Ligamentous testing appears negative. Meniscal testing deferred secondary to patient discomfort.  Right shoulder - no obvious deformity, discoloration, or edema. There is diffuse right shoulder tenderness which appears centered around subacromial space and upper aspect of the right arm. Distal pulses and sensation are intact and appropriate. Range of motion is limited below 90 of flexion and 120 of abduction. Strength is 4+ with discomfort in all directions with the exception of external rotation which is 3+. Positive empty can; positive Neer's impingement; negative Michel Bickers.  Neurological: She is alert and oriented to person, place, and time.  Skin: Skin is warm and dry.  Psychiatric: She has a normal mood and affect. Her behavior is normal. Judgment and thought content normal.       Assessment & Plan:   Problem List Items Addressed This Visit      Other   Right shoulder pain    Right shoulder pain with concern for osteoarthritis and possible rotator cuff tear. Obtain x-rays to rule out calcific tendinitis and determine status of osteoarthritis of present. Patient declines cortisone injection today. May require a CT scan to determine extent of pathology as patient is unable to have MRI. Treat conservatively with ice, elevation, and continue hydrocodone-acetaminophen as needed for pain. Follow-up pending x-ray results with referral to orthopedics placed.      Relevant Medications   HYDROcodone-acetaminophen (NORCO/VICODIN) 5-325 MG tablet   Other Relevant Orders   DG Shoulder Right   AMB  referral to orthopedics   Right knee pain    Atraumatic knee pain with concern for osteoarthritis. Obtain x-ray. Continue current dosage of hydrocodone-acetaminophen. Continue nonpharmacological treatments including ice and over-the-counter anti-inflammatories as needed. Elevation as tolerated. Declines cortisone injection. Referral to orthopedics place for further assessment and treatment.      Relevant Medications   HYDROcodone-acetaminophen (NORCO/VICODIN) 5-325 MG tablet   Other Relevant Orders   DG Knee Complete 4 Views Right   AMB referral to orthopedics    Other Visit Diagnoses   None.      I have changed Ms. Schweiger's HYDROcodone-acetaminophen. I am also having her start on fluconazole. Additionally, I am having her maintain her pantoprazole, fish oil-omega-3 fatty acids, PARoxetine, metoprolol succinate, acetaminophen, Polyvinyl Alcohol-Povidone (  MURINE TEARS FOR DRY EYES OP), aspirin EC, aspirin-acetaminophen-caffeine, diazepam, ibuprofen, OVER THE COUNTER MEDICATION, losartan, amiodarone, hydrOXYzine, pravastatin, and levothyroxine.   Meds ordered this encounter  Medications  . HYDROcodone-acetaminophen (NORCO/VICODIN) 5-325 MG tablet    Sig: Take 1-2 tablets by mouth every 6 (six) hours as needed for moderate pain.    Dispense:  90 tablet    Refill:  0    Order Specific Question:   Supervising Provider    Answer:   Pricilla Holm A [id number]  . fluconazole (DIFLUCAN) 150 MG tablet    Sig: Take 1 tablet (150 mg total) by mouth once.    Dispense:  1 tablet    Refill:  0    Order Specific Question:   Supervising Provider    Answer:   Pricilla Holm A [id number]     Follow-up: Return in about 1 month (around 07/21/2016), or if symptoms worsen or fail to improve.  Mauricio Po, FNP

## 2016-06-21 NOTE — Patient Instructions (Addendum)
Thank you for choosing [facility].  SUMMARY AND INSTRUCTIONS:  Ice x 20 minutes every 2 hours as needed for pain.  Sling as needed for the right arm.   Elevate your extremities when seated.  They will call with your follow up to orthopedics.  Medication:  Your prescription(s) have been submitted to your pharmacy or been printed and provided for you. Please take as directed and contact our office if you believe you are having problem(s) with the medication(s) or have any questions.   Imaging / Radiology:  Please stop by radiology on the basement level of the building for your x-rays. Your results will be released to [facility] (or called to you) after review, usually within 72 hours after test completion. If any treatments or changes are necessary, you will be notified at that same time.  Referrals:  Referrals have been made during this visit. You should expect to hear back from our schedulers in about 7-10 days in regards to establishing an appointment with the specialists we discussed.   Follow up:  If your symptoms worsen or fail to improve, please contact our office for further instruction, or in case of emergency go directly to the emergency room at the closest medical facility.

## 2016-07-13 ENCOUNTER — Encounter: Payer: Self-pay | Admitting: Family

## 2016-08-09 ENCOUNTER — Telehealth: Payer: Self-pay | Admitting: *Deleted

## 2016-08-09 ENCOUNTER — Other Ambulatory Visit: Payer: Self-pay | Admitting: Family

## 2016-08-09 DIAGNOSIS — G8929 Other chronic pain: Secondary | ICD-10-CM

## 2016-08-09 DIAGNOSIS — M25561 Pain in right knee: Secondary | ICD-10-CM

## 2016-08-09 DIAGNOSIS — I119 Hypertensive heart disease without heart failure: Secondary | ICD-10-CM | POA: Diagnosis not present

## 2016-08-09 DIAGNOSIS — M1990 Unspecified osteoarthritis, unspecified site: Secondary | ICD-10-CM | POA: Diagnosis not present

## 2016-08-09 DIAGNOSIS — I480 Paroxysmal atrial fibrillation: Secondary | ICD-10-CM | POA: Diagnosis not present

## 2016-08-09 DIAGNOSIS — I255 Ischemic cardiomyopathy: Secondary | ICD-10-CM | POA: Diagnosis not present

## 2016-08-09 DIAGNOSIS — E039 Hypothyroidism, unspecified: Secondary | ICD-10-CM | POA: Diagnosis not present

## 2016-08-09 DIAGNOSIS — E785 Hyperlipidemia, unspecified: Secondary | ICD-10-CM | POA: Diagnosis not present

## 2016-08-09 DIAGNOSIS — M25511 Pain in right shoulder: Secondary | ICD-10-CM

## 2016-08-09 DIAGNOSIS — J309 Allergic rhinitis, unspecified: Secondary | ICD-10-CM | POA: Diagnosis not present

## 2016-08-09 MED ORDER — HYDROCODONE-ACETAMINOPHEN 5-325 MG PO TABS: 1.0000 | ORAL_TABLET | Freq: Four times a day (QID) | ORAL | 0 refills | Status: DC | PRN

## 2016-08-09 NOTE — Telephone Encounter (Signed)
Rec'd fax pt requesting refill on her Hydrocodone. Last filled 06/22/16...[name]

## 2016-08-09 NOTE — Telephone Encounter (Signed)
Called pt no answer LMOM rx ready for pick-up at office...[name]

## 2016-08-09 NOTE — Telephone Encounter (Signed)
Medication refilled. Will need office visit for additional refills.

## 2016-08-11 DIAGNOSIS — I480 Paroxysmal atrial fibrillation: Secondary | ICD-10-CM | POA: Diagnosis not present

## 2016-08-11 DIAGNOSIS — I119 Hypertensive heart disease without heart failure: Secondary | ICD-10-CM | POA: Diagnosis not present

## 2016-08-11 DIAGNOSIS — E785 Hyperlipidemia, unspecified: Secondary | ICD-10-CM | POA: Diagnosis not present

## 2016-08-11 DIAGNOSIS — I255 Ischemic cardiomyopathy: Secondary | ICD-10-CM | POA: Diagnosis not present

## 2016-08-22 ENCOUNTER — Ambulatory Visit: Payer: Self-pay | Admitting: Family

## 2016-08-26 ENCOUNTER — Ambulatory Visit (INDEPENDENT_AMBULATORY_CARE_PROVIDER_SITE_OTHER): Payer: Medicare Other | Admitting: Family

## 2016-08-26 ENCOUNTER — Encounter: Payer: Self-pay | Admitting: Family

## 2016-08-26 VITALS — BP 122/85 | HR 77 | Temp 98.5°F | Resp 16 | Ht 64.0 in | Wt 169.0 lb

## 2016-08-26 DIAGNOSIS — M25562 Pain in left knee: Secondary | ICD-10-CM

## 2016-08-26 DIAGNOSIS — M25511 Pain in right shoulder: Secondary | ICD-10-CM | POA: Diagnosis not present

## 2016-08-26 DIAGNOSIS — G8929 Other chronic pain: Secondary | ICD-10-CM

## 2016-08-26 DIAGNOSIS — M419 Scoliosis, unspecified: Secondary | ICD-10-CM | POA: Diagnosis not present

## 2016-08-26 DIAGNOSIS — Z23 Encounter for immunization: Secondary | ICD-10-CM | POA: Diagnosis not present

## 2016-08-26 DIAGNOSIS — M25561 Pain in right knee: Secondary | ICD-10-CM

## 2016-08-26 DIAGNOSIS — Z79891 Long term (current) use of opiate analgesic: Secondary | ICD-10-CM | POA: Diagnosis not present

## 2016-08-26 MED ORDER — HYDROCODONE-ACETAMINOPHEN 5-325 MG PO TABS: 1.0000 | ORAL_TABLET | Freq: Four times a day (QID) | ORAL | 0 refills | Status: DC | PRN

## 2016-08-26 NOTE — Assessment & Plan Note (Signed)
Chronic low back pain with scoliosis remains unchanged from previous assessment. Patient appears compliant with medication regimen and little to no activity. Refer to physical therapy and orthopedics for further assessment and treatment. Referral to pain management placed. Continue to monitor.

## 2016-08-26 NOTE — Progress Notes (Signed)
Subjective:    Patient ID: [name], female    DOB: [birth year], 69 y.o.   MRN: [id number]  Chief Complaint  Patient presents with  . Follow-up    refill of pain medication    HPI:  [name] is a 70 y.o. female who  has a past medical history of Anxiety; Arthritis; Automatic implantable cardioverter-defibrillator in situ; CHF (congestive heart failure) ([place]) (05/2013); Depression; GERD (gastroesophageal reflux disease); Headache(784.0); Heart murmur; History of sudden cardiac arrest successfully resuscitated (2011); HLD (hyperlipidemia); Hypertension; Hypothyroidism; Paroxysmal atrial fibrillation ([place]); Pneumonia; Scoliosis; and Severe mitral regurgitation (05/2013). and presents today   Continues to experience the associated symptoms of pain located in her back, shoulder, and knee. Currently maintained on hydrocodone-acetaminophen. Reports taking the medication as prescribed and denies adverse side effects. Reports she takes it only when she gets to hurting very bad. Severity of the pain is enough that she cannot walk at times. Does ice on occasion. Previously scheduled for an orthopedic appointment and however secondary to financial situation and a "rude receptionist," she was not able to complete the appointment. Indicates she does not want any cortisone injections in either knee or shoulder.   Allergies  Allergen Reactions  . Morphine And Related Itching and Nausea And Vomiting  . Codeine Nausea And Vomiting  . Meperidine Hcl Nausea And Vomiting  . Pollen Extract Itching and Other (See Comments)    Seasonal allergies      Outpatient Medications Prior to Visit  Medication Sig Dispense Refill  . acetaminophen (TYLENOL) 500 MG tablet Take 1,000 mg by mouth every 6 (six) hours as needed. For pain    . amiodarone (PACERONE) 200 MG tablet Take 1 tablet (200 mg total) by mouth daily. 60 tablet 1  . aspirin EC 81 MG tablet Take 81 mg by mouth every other day.    Marland Kitchen  aspirin-acetaminophen-caffeine (EXCEDRIN MIGRAINE) 250-250-65 MG per tablet Take 1 tablet by mouth every 6 (six) hours as needed for headache.    . diazepam (VALIUM) 2 MG tablet Take 2 mg by mouth 2 (two) times daily as needed (for migraines).     . fish oil-omega-3 fatty acids 1000 MG capsule Take 4 capsules by mouth daily.     . hydrOXYzine (ATARAX/VISTARIL) 10 MG tablet Take 1 tablet by mouth daily. For allergies    . ibuprofen (ADVIL,MOTRIN) 400 MG tablet Take 1 tablet (400 mg total) by mouth every 6 (six) hours as needed. (Patient taking differently: Take 400 mg by mouth every 6 (six) hours as needed (pain). ) 30 tablet 0  . levothyroxine (SYNTHROID, LEVOTHROID) 125 MCG tablet TAKE 1 TABLET BY MOUTH DAILY 30 tablet 1  . losartan (COZAAR) 100 MG tablet Take 1 tablet by mouth daily.    . metoprolol succinate (TOPROL-XL) 50 MG 24 hr tablet Take 50 mg by mouth daily. Take with or immediately following a meal.    . OVER THE COUNTER MEDICATION Place 1 spray into both nostrils daily as needed (nasal congestion). Nasal Spray    . pantoprazole (PROTONIX) 40 MG tablet Take 40 mg by mouth daily.    Marland Kitchen PARoxetine (PAXIL) 30 MG tablet Take 60 mg by mouth daily.     . Polyvinyl Alcohol-Povidone (MURINE TEARS FOR DRY EYES OP) Place 2 drops into both eyes 2 (two) times daily as needed. For dry eyes    . pravastatin (PRAVACHOL) 40 MG tablet TAKE 1 TABLET BY MOUTH EVERY EVENING AS DIRECTED. 90 tablet 1  . HYDROcodone-acetaminophen (NORCO/VICODIN)  5-325 MG tablet Take 1-2 tablets by mouth every 6 (six) hours as needed for moderate pain. 90 tablet 0   No facility-administered medications prior to visit.       Past Surgical History:  Procedure Laterality Date  . ABDOMINAL HYSTERECTOMY    . APPENDECTOMY    . CARDIAC CATHETERIZATION    . CARDIAC DEFIBRILLATOR PLACEMENT  2010   Vfib arrest, single chamber ICD  . CARPAL TUNNEL RELEASE Right 03/05/2014   Procedure: RIGHT CARPAL TUNNEL RELEASE;  Surgeon: Schuyler Amor, MD;  Location: [facility];  Service: Orthopedics;  Laterality: Right;  . COLONOSCOPY    . HERNIA REPAIR  as child   x 2  . INTRAOPERATIVE TRANSESOPHAGEAL ECHOCARDIOGRAM N/A 05/24/2013   Procedure: INTRAOPERATIVE TRANSESOPHAGEAL ECHOCARDIOGRAM;  Surgeon: Ivin Poot, MD;  Location: [facility];  Service: Open Heart Surgery;  Laterality: N/A;  . LEFT HEART CATHETERIZATION WITH CORONARY ANGIOGRAM N/A 05/16/2013   Procedure: LEFT HEART CATHETERIZATION WITH CORONARY ANGIOGRAM;  Surgeon: Clent Demark, MD;  Location: [facility] CATH LAB;  Service: Cardiovascular;  Laterality: N/A;  . MITRAL VALVE REPAIR N/A 05/24/2013   Surgeon: Ivin Poot, MD; Service: Open Heart Surgery  . MULTIPLE EXTRACTIONS WITH ALVEOLOPLASTY N/A 05/21/2013   Procedure: Extraction of tooth #'s 2,4,18 with alveoloplasty and gross debridement of remaining teeth;  Surgeon: Lenn Cal, DDS;  Location: [facility];  Service: Oral Surgery;  Laterality: N/A;  . OPEN REDUCTION INTERNAL FIXATION (ORIF) DISTAL RADIAL FRACTURE Right 03/05/2014   Procedure: OPEN REDUCTION INTERNAL FIXATION (ORIF) RIGHT DISTAL RADIUS FRACTURE ;  Surgeon: Schuyler Amor, MD;  Location: [facility];  Service: Orthopedics;  Laterality: Right;  . TEE WITHOUT CARDIOVERSION  09/24/2012   Procedure: TRANSESOPHAGEAL ECHOCARDIOGRAM (TEE);  Surgeon: Fay Records, MD;  Location: [facility] ENDOSCOPY;  Service: Cardiovascular;  Laterality: N/A;      Past Medical History:  Diagnosis Date  . Anxiety   . Arthritis   . Automatic implantable cardioverter-defibrillator in situ   . CHF (congestive heart failure) ([place]) 05/2013   class III, severe  . Depression   . GERD (gastroesophageal reflux disease)   . Headache(784.0)    migraines  . Heart murmur   . History of sudden cardiac arrest successfully resuscitated 2011  . HLD (hyperlipidemia)   . Hypertension   . Hypothyroidism   . Paroxysmal atrial fibrillation ([facility])   . Pneumonia   . Scoliosis   . Severe mitral regurgitation  05/2013   s/p MV repair with annuloplasty      Review of Systems  Constitutional: Negative for chills and fever.  Respiratory: Negative for chest tightness, shortness of breath and wheezing.   Cardiovascular: Negative for chest pain, palpitations and leg swelling.  Musculoskeletal: Positive for back pain.       Positive for shoulder and knee pain.      Objective:    BP 122/85   Pulse 77   Temp 98.5 F (36.9 C) (Oral)   Resp 16   Ht 5\' 4"  (1.626 m)   Wt 169 lb (76.7 kg)   SpO2 97%   BMI 29.01 kg/m  Nursing note and vital signs reviewed.  Physical Exam  Constitutional: She is oriented to person, place, and time. She appears well-developed and well-nourished. No distress.  Cardiovascular: Normal rate, regular rhythm, normal heart sounds and intact distal pulses.   Pulmonary/Chest: Effort normal and breath sounds normal.  Musculoskeletal:  Bilateral knees -no obvious deformity or discoloration with mild edema. There is diffuse  tenderness along anterior bilateral joint lines with no crepitus or deformity. Range of motion appears normal. Strength is 4+. Ligamentous and meniscal testing are negative. Distal pulses and sensation are intact and appropriate  Right shoulder - exam remains unchanged from previous with decreased range of motion below 90 of flexion and 120 of abduction. Strength appears 4+.  Neurological: She is alert and oriented to person, place, and time.  Skin: Skin is warm and dry.  Psychiatric: She has a normal mood and affect. Her behavior is normal. Judgment and thought content normal.       Assessment & Plan:   Problem List Items Addressed This Visit      Musculoskeletal and Integument   Scoliosis - Primary    Chronic low back pain with scoliosis remains unchanged from previous assessment. Patient appears compliant with medication regimen and little to no activity. Refer to physical therapy and orthopedics for further assessment and treatment. Referral to  pain management placed. Continue to monitor.      Relevant Orders   Ambulatory referral to Physical Therapy   AMB referral to orthopedics   Ambulatory referral to Pain Clinic     Other   Right shoulder pain    Right shoulder pain with no significant improvements since previous assessment. Patient did not completely orthopedic referral secondary to financial reasons. Discussed importance of therapy and/or additional treatment in order to help improve her symptoms. Refer to physical therapy for strengthening. Declines cortisone injection today. Continue to monitor.      Relevant Medications   HYDROcodone-acetaminophen (NORCO/VICODIN) 5-325 MG tablet   Other Relevant Orders   Ambulatory referral to Physical Therapy   AMB referral to orthopedics   Ambulatory referral to Pain Clinic   Bilateral knee pain    Bilateral knee pain consistent with primary osteoarthritis with referral to orthopedics placed as patient declines cortisone injection. Refer to physical therapy for strengthening and pain management. Continue current dosage of hydrocodone-acetaminophen. [place] controlled substance database reviewed with no irregularities. Urine drug screen obtained today. This is short-term course of narcotics with referral to pain management placed. Continue to monitor.      Relevant Orders   Ambulatory referral to Physical Therapy   AMB referral to orthopedics   Ambulatory referral to Pain Clinic    Other Visit Diagnoses    Encounter for immunization       Relevant Orders   Flu vaccine HIGH DOSE PF (Completed)       I am having Ms. Rimmer maintain her pantoprazole, fish oil-omega-3 fatty acids, PARoxetine, metoprolol succinate, acetaminophen, Polyvinyl Alcohol-Povidone (MURINE TEARS FOR DRY EYES OP), aspirin EC, aspirin-acetaminophen-caffeine, diazepam, ibuprofen, OVER THE COUNTER MEDICATION, losartan, amiodarone, hydrOXYzine, pravastatin, levothyroxine, and  HYDROcodone-acetaminophen.   Meds ordered this encounter  Medications  . HYDROcodone-acetaminophen (NORCO/VICODIN) 5-325 MG tablet    Sig: Take 1-2 tablets by mouth every 6 (six) hours as needed for moderate pain.    Dispense:  90 tablet    Refill:  0    Order Specific Question:   Supervising Provider    Answer:   Pricilla Holm A [[id number]     Follow-up: Return if symptoms worsen or fail to improve.  Mauricio Po, FNP

## 2016-08-26 NOTE — Assessment & Plan Note (Signed)
Bilateral knee pain consistent with primary osteoarthritis with referral to orthopedics placed as patient declines cortisone injection. Refer to physical therapy for strengthening and pain management. Continue current dosage of hydrocodone-acetaminophen. [place] controlled substance database reviewed with no irregularities. Urine drug screen obtained today. This is short-term course of narcotics with referral to pain management placed. Continue to monitor.

## 2016-08-26 NOTE — Assessment & Plan Note (Signed)
Right shoulder pain with no significant improvements since previous assessment. Patient did not completely orthopedic referral secondary to financial reasons. Discussed importance of therapy and/or additional treatment in order to help improve her symptoms. Refer to physical therapy for strengthening. Declines cortisone injection today. Continue to monitor.

## 2016-08-26 NOTE — Patient Instructions (Addendum)
Thank you for choosing [facility].  SUMMARY AND INSTRUCTIONS:  Medication:  Please continue to take your medication as prescribed.   Your prescription(s) have been submitted to your pharmacy or been printed and provided for you. Please take as directed and contact our office if you believe you are having problem(s) with the medication(s) or have any questions.  Labs:  Please stop by the lab on the lower level of the building for your blood work. Your results will be released to MyChart (or called to you) after review, usually within 72 hours after test completion. If any changes need to be made, you will be notified at that same time.  1.) The lab is open from 7:30am to 5:30 pm Monday-Friday 2.) No appointment is necessary 3.) Fasting (if needed) is 6-8 hours after food and drink; black coffee and water are okay    Follow up:  If your symptoms worsen or fail to improve, please contact our office for further instruction, or in case of emergency go directly to the emergency room at the closest medical facility.     

## 2016-09-14 ENCOUNTER — Encounter: Payer: Self-pay | Admitting: Family

## 2016-09-14 ENCOUNTER — Telehealth: Payer: Self-pay | Admitting: Family

## 2016-09-16 ENCOUNTER — Telehealth: Payer: Self-pay | Admitting: Family

## 2016-09-16 NOTE — Telephone Encounter (Signed)
Called [name] to schedule awv appt. Left msg for pt to call office to schedule appt.

## 2016-09-20 ENCOUNTER — Other Ambulatory Visit: Payer: Self-pay | Admitting: Family

## 2016-09-27 ENCOUNTER — Ambulatory Visit: Payer: Self-pay | Admitting: Family

## 2016-10-03 ENCOUNTER — Ambulatory Visit (INDEPENDENT_AMBULATORY_CARE_PROVIDER_SITE_OTHER): Payer: Medicare Other | Admitting: Family

## 2016-10-03 ENCOUNTER — Encounter: Payer: Self-pay | Admitting: Family

## 2016-10-03 VITALS — BP 160/96 | HR 58 | Temp 98.5°F | Resp 16 | Ht 64.0 in | Wt 160.0 lb

## 2016-10-03 DIAGNOSIS — M25511 Pain in right shoulder: Secondary | ICD-10-CM

## 2016-10-03 DIAGNOSIS — J0140 Acute pansinusitis, unspecified: Secondary | ICD-10-CM | POA: Insufficient documentation

## 2016-10-03 MED ORDER — HYDROCODONE-ACETAMINOPHEN 5-325 MG PO TABS: 1.0000 | ORAL_TABLET | Freq: Four times a day (QID) | ORAL | 0 refills | Status: DC | PRN

## 2016-10-03 MED ORDER — AMOXICILLIN-POT CLAVULANATE 875-125 MG PO TABS: 1.0000 | ORAL_TABLET | Freq: Two times a day (BID) | ORAL | 0 refills | Status: DC

## 2016-10-03 NOTE — Progress Notes (Signed)
Subjective:    Patient ID: [name], female    DOB: Nov 09, 1945, 70 y.o.   MRN: [id number]  Chief Complaint  Patient presents with  . Follow-up    has congestion, cough that has lasted 2 weeks    HPI:  [name] is a 70 y.o. female who  has a past medical history of Anxiety; Arthritis; Automatic implantable cardioverter-defibrillator in situ; CHF (congestive heart failure) ([place]) (05/2013); Depression; GERD (gastroesophageal reflux disease); Headache(784.0); Heart murmur; History of sudden cardiac arrest successfully resuscitated (2011); HLD (hyperlipidemia); Hypertension; Hypothyroidism; Paroxysmal atrial fibrillation ([place]); Pneumonia; Scoliosis; and Severe mitral regurgitation (05/2013). and presents today for an acute office visit.   This is new problem. Associated symptom of cough, congestion, sinus pressure, and scratchy throat have been going on for about 2 weeks. No fevers. Modifying factors include nasal spray and chloroseptic which have helped with her symptoms but not affected the overall course. Course of the symptoms has stayed about the same. No recent antibiotics. Nephew was sick.   Allergies  Allergen Reactions  . Morphine And Related Itching and Nausea And Vomiting  . Codeine Nausea And Vomiting  . Meperidine Hcl Nausea And Vomiting  . Pollen Extract Itching and Other (See Comments)    Seasonal allergies      Outpatient Medications Prior to Visit  Medication Sig Dispense Refill  . acetaminophen (TYLENOL) 500 MG tablet Take 1,000 mg by mouth every 6 (six) hours as needed. For pain    . amiodarone (PACERONE) 200 MG tablet Take 1 tablet (200 mg total) by mouth daily. 60 tablet 1  . aspirin EC 81 MG tablet Take 81 mg by mouth every other day.    Marland Kitchen aspirin-acetaminophen-caffeine (EXCEDRIN MIGRAINE) 250-250-65 MG per tablet Take 1 tablet by mouth every 6 (six) hours as needed for headache.    . diazepam (VALIUM) 2 MG tablet Take 2 mg by mouth 2 (two) times daily as  needed (for migraines).     . fish oil-omega-3 fatty acids 1000 MG capsule Take 4 capsules by mouth daily.     . hydrOXYzine (ATARAX/VISTARIL) 10 MG tablet Take 1 tablet by mouth daily. For allergies    . ibuprofen (ADVIL,MOTRIN) 400 MG tablet Take 1 tablet (400 mg total) by mouth every 6 (six) hours as needed. (Patient taking differently: Take 400 mg by mouth every 6 (six) hours as needed (pain). ) 30 tablet 0  . levothyroxine (SYNTHROID, LEVOTHROID) 125 MCG tablet TAKE 1 TABLET BY MOUTH DAILY 30 tablet 0  . losartan (COZAAR) 100 MG tablet Take 1 tablet by mouth daily.    . metoprolol succinate (TOPROL-XL) 50 MG 24 hr tablet Take 50 mg by mouth daily. Take with or immediately following a meal.    . OVER THE COUNTER MEDICATION Place 1 spray into both nostrils daily as needed (nasal congestion). Nasal Spray    . PARoxetine (PAXIL) 30 MG tablet Take 60 mg by mouth daily.     . Polyvinyl Alcohol-Povidone (MURINE TEARS FOR DRY EYES OP) Place 2 drops into both eyes 2 (two) times daily as needed. For dry eyes    . pravastatin (PRAVACHOL) 40 MG tablet TAKE 1 TABLET BY MOUTH EVERY EVENING AS DIRECTED. 90 tablet 1  . HYDROcodone-acetaminophen (NORCO/VICODIN) 5-325 MG tablet Take 1-2 tablets by mouth every 6 (six) hours as needed for moderate pain. 90 tablet 0  . pantoprazole (PROTONIX) 40 MG tablet Take 40 mg by mouth daily.     No facility-administered medications prior to visit.  Review of Systems  Constitutional: Negative for chills and fever.  HENT: Positive for congestion, sinus pain and sinus pressure. Negative for ear pain and sore throat.   Respiratory: Positive for cough. Negative for chest tightness and shortness of breath.       Objective:    BP (!) 160/96 (BP Location: Left Arm, Patient Position: Sitting, Cuff Size: Normal)   Pulse (!) 58   Temp 98.5 F (36.9 C) (Oral)   Resp 16   Ht 5\' 4"  (1.626 m)   Wt 160 lb (72.6 kg)   SpO2 97%   BMI 27.46 kg/m  Nursing note and vital  signs reviewed.  Physical Exam  Constitutional: She is oriented to person, place, and time. She appears well-developed and well-nourished.  HENT:  Right Ear: Hearing, tympanic membrane, external ear and ear canal normal.  Left Ear: Hearing, tympanic membrane, external ear and ear canal normal.  Nose: Right sinus exhibits maxillary sinus tenderness and frontal sinus tenderness. Left sinus exhibits maxillary sinus tenderness and frontal sinus tenderness.  Mouth/Throat: Uvula is midline, oropharynx is clear and moist and mucous membranes are normal.  Neck: Neck supple.  Cardiovascular: Normal rate, regular rhythm, normal heart sounds and intact distal pulses.   Pulmonary/Chest: Effort normal and breath sounds normal.  Neurological: She is alert and oriented to person, place, and time.  Skin: Skin is warm and dry.       Assessment & Plan:   Problem List Items Addressed This Visit      Respiratory   Acute non-recurrent pansinusitis - Primary    Symptoms and exam consistent with bacterial pansinusitis. Start Augmentin. Continue over the counter medications as needed for symptom relief and supportive care. Follow up if symptoms worsen or do not improve.       Relevant Medications   amoxicillin-clavulanate (AUGMENTIN) 875-125 MG tablet     Other   Right shoulder pain   Relevant Medications   HYDROcodone-acetaminophen (NORCO/VICODIN) 5-325 MG tablet       I have discontinued Ms. Bonk's pantoprazole. I have also changed her HYDROcodone-acetaminophen. Additionally, I am having her start on amoxicillin-clavulanate. Lastly, I am having her maintain her fish oil-omega-3 fatty acids, PARoxetine, metoprolol succinate, acetaminophen, Polyvinyl Alcohol-Povidone (MURINE TEARS FOR DRY EYES OP), aspirin EC, aspirin-acetaminophen-caffeine, diazepam, ibuprofen, OVER THE COUNTER MEDICATION, losartan, amiodarone, hydrOXYzine, pravastatin, and levothyroxine.   Meds ordered this encounter    Medications  . amoxicillin-clavulanate (AUGMENTIN) 875-125 MG tablet    Sig: Take 1 tablet by mouth 2 (two) times daily.    Dispense:  20 tablet    Refill:  0    Order Specific Question:   Supervising Provider    Answer:   Pricilla Holm A [[id number]]  . HYDROcodone-acetaminophen (NORCO/VICODIN) 5-325 MG tablet    Sig: Take 1 tablet by mouth every 6 (six) hours as needed for moderate pain.    Dispense:  90 tablet    Refill:  0    Order Specific Question:   Supervising Provider    Answer:   Pricilla Holm A [[id number]     Follow-up: Return if symptoms worsen or fail to improve.  Mauricio Po, FNP

## 2016-10-03 NOTE — Assessment & Plan Note (Signed)
Symptoms and exam consistent with bacterial pansinusitis. Start Augmentin. Continue over the counter medications as needed for symptom relief and supportive care. Follow up if symptoms worsen or do not improve.

## 2016-10-03 NOTE — Patient Instructions (Signed)
Thank you for choosing [facility].  SUMMARY AND INSTRUCTIONS:  Medication:  Your prescription(s) have been submitted to your pharmacy or been printed and provided for you. Please take as directed and contact our office if you believe you are having problem(s) with the medication(s) or have any questions.   Follow up:  If your symptoms worsen or fail to improve, please contact our office for further instruction, or in case of emergency go directly to the emergency room at the closest medical facility.    General Recommendations:    Please drink plenty of fluids.  Get plenty of rest   Sleep in humidified air  Use saline nasal sprays  Netti pot   OTC Medications:  Decongestants - helps relieve congestion   Flonase (generic fluticasone) or Nasacort (generic triamcinolone) - please make sure to use the "cross-over" technique at a 45 degree angle towards the opposite eye as opposed to straight up the nasal passageway.   Sudafed (generic pseudoephedrine - Note this is the one that is available behind the pharmacy counter); Products with phenylephrine (-PE) may also be used but is often not as effective as pseudoephedrine.   If you have HIGH BLOOD PRESSURE - Coricidin HBP; AVOID any product that is -D as this contains pseudoephedrine which may increase your blood pressure.  Afrin (oxymetazoline) every 6-8 hours for up to 3 days.   Allergies - helps relieve runny nose, itchy eyes and sneezing   Claritin (generic loratidine), Allegra (fexofenidine), or Zyrtec (generic cyrterizine) for runny nose. These medications should not cause drowsiness.  Note - Benadryl (generic diphenhydramine) may be used however may cause drowsiness  Cough -   Delsym or Robitussin (generic dextromethorphan)  Expectorants - helps loosen mucus to ease removal   Mucinex (generic guaifenesin) as directed on the package.  Headaches / General Aches   Tylenol (generic acetaminophen) - DO  NOT EXCEED 3 grams (3,000 mg) in a 24 hour time period  Advil/Motrin (generic ibuprofen)   Sore Throat -   Salt water gargle   Chloraseptic (generic benzocaine) spray or lozenges / Sucrets (generic dyclonine)    Sinusitis Sinusitis is redness, soreness, and inflammation of the paranasal sinuses. Paranasal sinuses are air pockets within the bones of your face (beneath the eyes, the middle of the forehead, or above the eyes). In healthy paranasal sinuses, mucus is able to drain out, and air is able to circulate through them by way of your nose. However, when your paranasal sinuses are inflamed, mucus and air can become trapped. This can allow bacteria and other germs to grow and cause infection. Sinusitis can develop quickly and last only a short time (acute) or continue over a long period (chronic). Sinusitis that lasts for more than 12 weeks is considered chronic.  CAUSES  Causes of sinusitis include:  Allergies.  Structural abnormalities, such as displacement of the cartilage that separates your nostrils (deviated septum), which can decrease the air flow through your nose and sinuses and affect sinus drainage.  Functional abnormalities, such as when the small hairs (cilia) that line your sinuses and help remove mucus do not work properly or are not present. SIGNS AND SYMPTOMS  Symptoms of acute and chronic sinusitis are the same. The primary symptoms are pain and pressure around the affected sinuses. Other symptoms include:  Upper toothache.  Earache.  Headache.  Bad breath.  Decreased sense of smell and taste.  A cough, which worsens when you are lying flat.  Fatigue.  Fever.  Thick drainage   from your nose, which often is green and may contain pus (purulent).  Swelling and warmth over the affected sinuses. DIAGNOSIS  Your health care provider will perform a physical exam. During the exam, your health care provider may:  Look in your nose for signs of abnormal growths  in your nostrils (nasal polyps).  Tap over the affected sinus to check for signs of infection.  View the inside of your sinuses (endoscopy) using an imaging device that has a light attached (endoscope). If your health care provider suspects that you have chronic sinusitis, one or more of the following tests may be recommended:  Allergy tests.  Nasal culture. A sample of mucus is taken from your nose, sent to a lab, and screened for bacteria.  Nasal cytology. A sample of mucus is taken from your nose and examined by your health care provider to determine if your sinusitis is related to an allergy. TREATMENT  Most cases of acute sinusitis are related to a viral infection and will resolve on their own within 10 days. Sometimes medicines are prescribed to help relieve symptoms (pain medicine, decongestants, nasal steroid sprays, or saline sprays).  However, for sinusitis related to a bacterial infection, your health care provider will prescribe antibiotic medicines. These are medicines that will help kill the bacteria causing the infection.  Rarely, sinusitis is caused by a fungal infection. In theses cases, your health care provider will prescribe antifungal medicine. For some cases of chronic sinusitis, surgery is needed. Generally, these are cases in which sinusitis recurs more than 3 times per year, despite other treatments. HOME CARE INSTRUCTIONS   Drink plenty of water. Water helps thin the mucus so your sinuses can drain more easily.  Use a humidifier.  Inhale steam 3 to 4 times a day (for example, sit in the bathroom with the shower running).  Apply a warm, moist washcloth to your face 3 to 4 times a day, or as directed by your health care provider.  Use saline nasal sprays to help moisten and clean your sinuses.  Take medicines only as directed by your health care provider.  If you were prescribed either an antibiotic or antifungal medicine, finish it all even if you start to feel  better. SEEK IMMEDIATE MEDICAL CARE IF:  You have increasing pain or severe headaches.  You have nausea, vomiting, or drowsiness.  You have swelling around your face.  You have vision problems.  You have a stiff neck.  You have difficulty breathing. MAKE SURE YOU:   Understand these instructions.  Will watch your condition.  Will get help right away if you are not doing well or get worse. Document Released: 10/03/2005 Document Revised: 02/17/2014 Document Reviewed: 10/18/2011 ExitCare Patient Information 2015 [employer]. This information is not intended to replace advice given to you by your health care provider. Make sure you discuss any questions you have with your health care provider.   

## 2016-10-04 ENCOUNTER — Encounter: Payer: Self-pay | Admitting: Family

## 2016-10-06 ENCOUNTER — Encounter: Payer: Self-pay | Admitting: Family

## 2016-11-09 ENCOUNTER — Encounter: Payer: Self-pay | Admitting: Family

## 2016-12-05 ENCOUNTER — Other Ambulatory Visit: Payer: Self-pay | Admitting: Family

## 2016-12-09 DIAGNOSIS — E039 Hypothyroidism, unspecified: Secondary | ICD-10-CM | POA: Diagnosis not present

## 2016-12-09 DIAGNOSIS — M1990 Unspecified osteoarthritis, unspecified site: Secondary | ICD-10-CM | POA: Diagnosis not present

## 2016-12-09 DIAGNOSIS — J309 Allergic rhinitis, unspecified: Secondary | ICD-10-CM | POA: Diagnosis not present

## 2016-12-09 DIAGNOSIS — I119 Hypertensive heart disease without heart failure: Secondary | ICD-10-CM | POA: Diagnosis not present

## 2016-12-09 DIAGNOSIS — Z95810 Presence of automatic (implantable) cardiac defibrillator: Secondary | ICD-10-CM | POA: Diagnosis not present

## 2016-12-09 DIAGNOSIS — E785 Hyperlipidemia, unspecified: Secondary | ICD-10-CM | POA: Diagnosis not present

## 2016-12-09 DIAGNOSIS — I255 Ischemic cardiomyopathy: Secondary | ICD-10-CM | POA: Diagnosis not present

## 2016-12-09 DIAGNOSIS — I480 Paroxysmal atrial fibrillation: Secondary | ICD-10-CM | POA: Diagnosis not present

## 2016-12-16 ENCOUNTER — Encounter: Payer: Self-pay | Admitting: Internal Medicine

## 2016-12-20 ENCOUNTER — Encounter: Payer: Self-pay | Admitting: Internal Medicine

## 2017-01-06 ENCOUNTER — Encounter: Payer: Self-pay | Admitting: Cardiology

## 2017-01-27 ENCOUNTER — Encounter: Payer: Self-pay | Admitting: Family

## 2017-03-06 DIAGNOSIS — I10 Essential (primary) hypertension: Secondary | ICD-10-CM | POA: Diagnosis not present

## 2017-03-06 DIAGNOSIS — E785 Hyperlipidemia, unspecified: Secondary | ICD-10-CM | POA: Diagnosis not present

## 2017-03-31 DIAGNOSIS — R350 Frequency of micturition: Secondary | ICD-10-CM | POA: Diagnosis not present

## 2017-03-31 DIAGNOSIS — J309 Allergic rhinitis, unspecified: Secondary | ICD-10-CM | POA: Diagnosis not present

## 2017-03-31 DIAGNOSIS — I480 Paroxysmal atrial fibrillation: Secondary | ICD-10-CM | POA: Diagnosis not present

## 2017-03-31 DIAGNOSIS — I119 Hypertensive heart disease without heart failure: Secondary | ICD-10-CM | POA: Diagnosis not present

## 2017-03-31 DIAGNOSIS — E785 Hyperlipidemia, unspecified: Secondary | ICD-10-CM | POA: Diagnosis not present

## 2017-03-31 DIAGNOSIS — I255 Ischemic cardiomyopathy: Secondary | ICD-10-CM | POA: Diagnosis not present

## 2017-03-31 DIAGNOSIS — M1990 Unspecified osteoarthritis, unspecified site: Secondary | ICD-10-CM | POA: Diagnosis not present

## 2017-03-31 DIAGNOSIS — R300 Dysuria: Secondary | ICD-10-CM | POA: Diagnosis not present

## 2017-03-31 DIAGNOSIS — E039 Hypothyroidism, unspecified: Secondary | ICD-10-CM | POA: Diagnosis not present

## 2017-04-06 ENCOUNTER — Encounter: Payer: Self-pay | Admitting: Internal Medicine

## 2017-04-24 ENCOUNTER — Encounter: Payer: Self-pay | Admitting: Internal Medicine

## 2017-04-24 ENCOUNTER — Ambulatory Visit (INDEPENDENT_AMBULATORY_CARE_PROVIDER_SITE_OTHER): Payer: Medicare Other | Admitting: Internal Medicine

## 2017-04-24 VITALS — BP 158/90 | HR 58 | Ht 64.0 in | Wt 163.6 lb

## 2017-04-24 DIAGNOSIS — Z95810 Presence of automatic (implantable) cardiac defibrillator: Secondary | ICD-10-CM | POA: Diagnosis not present

## 2017-04-24 DIAGNOSIS — I4901 Ventricular fibrillation: Secondary | ICD-10-CM | POA: Diagnosis not present

## 2017-04-24 MED ORDER — AMIODARONE HCL 200 MG PO TABS: ORAL_TABLET | ORAL | 1 refills | Status: DC

## 2017-04-24 NOTE — Patient Instructions (Addendum)
Medication Instructions:  Your physician has recommended you make the following change in your medication: decrease your amiodarone.   Take 200 mg (one tablet) by mouth daily Monday through Saturday.  Do not take amiodarone on Sunday.    Labwork: None ordered.   Testing/Procedures: None ordered.   Follow-Up: Your physician wants you to follow-up in: device clinic in 3 months.   You will follow up with Dr. Lovena Le in one year.  You will receive a reminder letter in the mail two months in advance. If you don't receive a letter, please call our office to schedule the follow-up appointment.    Any Other Special Instructions Will Be Listed Below (If Applicable).     If you need a refill on your cardiac medications before your next appointment, please call your pharmacy.

## 2017-04-24 NOTE — Progress Notes (Signed)
HPI [name] returns today for followup. She is a very pleasant 71 year old woman, with coronary disease, mitral regurgitation, chronic systolic heart failure, status post ventricular fibrillation arrest. She is status post ICD implantation.  She continues to have dietary indiscretion.  Minimal peripheral edema. She denies anginal symptoms. Her heart failure remains class 2. Her main complaint is related to nausea and back pain. She has not had any ICD shocks Allergies  Allergen Reactions  . Morphine And Related Itching and Nausea And Vomiting  . Codeine Nausea And Vomiting  . Meperidine Hcl Nausea And Vomiting  . Pollen Extract Itching and Other (See Comments)    Seasonal allergies     Current Outpatient Prescriptions  Medication Sig Dispense Refill  . acetaminophen (TYLENOL) 500 MG tablet Take 1,000 mg by mouth every 6 (six) hours as needed. For pain    . amiodarone (PACERONE) 200 MG tablet Take 1 tablet (200 mg total) by mouth daily. 60 tablet 1  . amLODipine (NORVASC) 2.5 MG tablet Take 2.5 mg by mouth daily.    Marland Kitchen amoxicillin-clavulanate (AUGMENTIN) 875-125 MG tablet Take 1 tablet by mouth 2 (two) times daily. 20 tablet 0  . aspirin EC 81 MG tablet Take 81 mg by mouth every other day.    Marland Kitchen aspirin-acetaminophen-caffeine (EXCEDRIN MIGRAINE) 250-250-65 MG per tablet Take 1 tablet by mouth every 6 (six) hours as needed for headache.    . diazepam (VALIUM) 2 MG tablet Take 2 mg by mouth 2 (two) times daily as needed (for migraines).     . fish oil-omega-3 fatty acids 1000 MG capsule Take 4 capsules by mouth daily.     Marland Kitchen HYDROcodone-acetaminophen (NORCO/VICODIN) 5-325 MG tablet Take 1 tablet by mouth every 6 (six) hours as needed for moderate pain. 90 tablet 0  . hydrOXYzine (ATARAX/VISTARIL) 10 MG tablet Take 1 tablet by mouth daily. For allergies    . ibuprofen (ADVIL,MOTRIN) 400 MG tablet Take 1 tablet (400 mg total) by mouth every 6 (six) hours as needed. (Patient taking differently:  Take 400 mg by mouth every 6 (six) hours as needed (pain). ) 30 tablet 0  . levothyroxine (SYNTHROID, LEVOTHROID) 125 MCG tablet TAKE 1 TABLET BY MOUTH DAILY 90 tablet 1  . losartan (COZAAR) 100 MG tablet Take 1 tablet by mouth daily.    . metoprolol succinate (TOPROL-XL) 50 MG 24 hr tablet Take 50 mg by mouth daily. Take with or immediately following a meal.    . OVER THE COUNTER MEDICATION Place 1 spray into both nostrils daily as needed (nasal congestion). Nasal Spray    . PARoxetine (PAXIL) 30 MG tablet Take 60 mg by mouth daily.     . Polyvinyl Alcohol-Povidone (MURINE TEARS FOR DRY EYES OP) Place 2 drops into both eyes 2 (two) times daily as needed. For dry eyes    . pravastatin (PRAVACHOL) 40 MG tablet TAKE 1 TABLET BY MOUTH EVERY EVENING AS DIRECTED. 90 tablet 1   No current facility-administered medications for this visit.      Past Medical History:  Diagnosis Date  . Anxiety   . Arthritis   . Automatic implantable cardioverter-defibrillator in situ   . CHF (congestive heart failure) ([place]) 05/2013   class III, severe  . Depression   . GERD (gastroesophageal reflux disease)   . Headache(784.0)    migraines  . Heart murmur   . History of sudden cardiac arrest successfully resuscitated 2011  . HLD (hyperlipidemia)   . Hypertension   . Hypothyroidism   .  Paroxysmal atrial fibrillation ([facility])   . Pneumonia   . Scoliosis   . Severe mitral regurgitation 05/2013   s/p MV repair with annuloplasty    ROS:   All systems reviewed and negative except as noted in the HPI.   Past Surgical History:  Procedure Laterality Date  . ABDOMINAL HYSTERECTOMY    . APPENDECTOMY    . CARDIAC CATHETERIZATION    . CARDIAC DEFIBRILLATOR PLACEMENT  2010   Vfib arrest, single chamber ICD  . CARPAL TUNNEL RELEASE Right 03/05/2014   Procedure: RIGHT CARPAL TUNNEL RELEASE;  Surgeon: Schuyler Amor, MD;  Location: [facility];  Service: Orthopedics;  Laterality: Right;  . COLONOSCOPY    . HERNIA  REPAIR  as child   x 2  . INTRAOPERATIVE TRANSESOPHAGEAL ECHOCARDIOGRAM N/A 05/24/2013   Procedure: INTRAOPERATIVE TRANSESOPHAGEAL ECHOCARDIOGRAM;  Surgeon: Ivin Poot, MD;  Location: [facility];  Service: Open Heart Surgery;  Laterality: N/A;  . LEFT HEART CATHETERIZATION WITH CORONARY ANGIOGRAM N/A 05/16/2013   Procedure: LEFT HEART CATHETERIZATION WITH CORONARY ANGIOGRAM;  Surgeon: Clent Demark, MD;  Location: [facility] CATH [facility];  Service: Cardiovascular;  Laterality: N/A;  . MITRAL VALVE REPAIR N/A 05/24/2013   Surgeon: Ivin Poot, MD; Service: Open Heart Surgery  . MULTIPLE EXTRACTIONS WITH ALVEOLOPLASTY N/A 05/21/2013   Procedure: Extraction of tooth #'s 2,4,18 with alveoloplasty and gross debridement of remaining teeth;  Surgeon: Lenn Cal, DDS;  Location: [facility];  Service: Oral Surgery;  Laterality: N/A;  . OPEN REDUCTION INTERNAL FIXATION (ORIF) DISTAL RADIAL FRACTURE Right 03/05/2014   Procedure: OPEN REDUCTION INTERNAL FIXATION (ORIF) RIGHT DISTAL RADIUS FRACTURE ;  Surgeon: Schuyler Amor, MD;  Location: [facility];  Service: Orthopedics;  Laterality: Right;  . TEE WITHOUT CARDIOVERSION  09/24/2012   Procedure: TRANSESOPHAGEAL ECHOCARDIOGRAM (TEE);  Surgeon: Fay Records, MD;  Location: [facility] ENDOSCOPY;  Service: Cardiovascular;  Laterality: N/A;     Family History  Problem Relation Age of Onset  . Cancer - Other Mother   . CAD Mother   . Arthritis Mother   . CVA Sister   . CAD Sister   . Seizures Brother   . Heart disease Father      Social History   Social History  . Marital status: Widowed    Spouse name: N/A  . Number of children: 1  . Years of education: 9   Occupational History  . Retired    Social History Main Topics  . Smoking status: Never Smoker  . Smokeless tobacco: Never Used  . Alcohol use No  . Drug use: No  . Sexual activity: Yes    Birth control/ protection: Surgical   Other Topics Concern  . Not on file   Social History Narrative   Fun:  Shop, read, dance   Denies religious beliefs effecting health care.      BP (!) 158/90   Pulse (!) 58   Ht 5\' 4"  (1.626 m)   Wt 163 lb 9.6 oz (74.2 kg)   BMI 28.08 kg/m   Physical Exam:  Well appearing 71 yo man, NAD HEENT: Unremarkable Neck: 7 cm JVD, no thyromegally Lungs:  Clear except for basilar rales. No wheezes or rhonchi. Well-healed ICD incision. HEART:  Regular rate rhythm, grade 2/6 systolic murmur, no rubs, no clicks Abd:  soft, positive bowel sounds, no organomegally, no rebound, no guarding Ext:  2 plus pulses, no edema, no cyanosis, no clubbing Skin:  No rashes no nodules Neuro:  CN II  through XII intact, motor grossly intact  DEVICE  Normal device function.  See PaceArt for details.   Assess/Plan:  1. VF arrest - she has had no additional symptoms. I have asked her to reduce her dose of amiodarone by not taking any amiodarone on Sunday's. 2. ICD - her [employer] device is working normally. Will recheck in several months. 3. Chronic systolic heart failure - her symptoms are class 2. She is limited mostly by her arthritis. She will continue her current meds. I have encouraged the patient to increase her physical activity. 4. Arthritis - she is instructed to increase her activity as she is able and to use NSAIDS judiciously. She asked me about the use of muscle relaxers and I have referred her to her primary MD's.  Cristopher Peru, M.D.

## 2017-04-27 LAB — CUP PACEART INCLINIC DEVICE CHECK
Battery Remaining Longevity: 50 mo
Brady Statistic RV Percent Paced: 0.13 %
Date Time Interrogation Session: 20180709192204
HighPow Impedance: 43.2327
Implantable Lead Implant Date: 20101124
Implantable Lead Location: 753860
Implantable Pulse Generator Implant Date: 20101124
Lead Channel Pacing Threshold Pulse Width: 0.4 ms
Lead Channel Pacing Threshold Pulse Width: 0.4 ms
Lead Channel Setting Sensing Sensitivity: 0.5 mV
MDC IDC MSMT LEADCHNL RV IMPEDANCE VALUE: 375 Ohm
MDC IDC MSMT LEADCHNL RV PACING THRESHOLD AMPLITUDE: 1 V
MDC IDC MSMT LEADCHNL RV PACING THRESHOLD AMPLITUDE: 1 V
MDC IDC MSMT LEADCHNL RV SENSING INTR AMPL: 7.4 mV
MDC IDC PG SERIAL: 745406
MDC IDC SET LEADCHNL RV PACING AMPLITUDE: 2.5 V
MDC IDC SET LEADCHNL RV PACING PULSEWIDTH: 0.4 ms

## 2017-07-26 ENCOUNTER — Ambulatory Visit (INDEPENDENT_AMBULATORY_CARE_PROVIDER_SITE_OTHER): Payer: Medicare Other | Admitting: *Deleted

## 2017-07-26 DIAGNOSIS — E785 Hyperlipidemia, unspecified: Secondary | ICD-10-CM | POA: Diagnosis not present

## 2017-07-26 DIAGNOSIS — I5020 Unspecified systolic (congestive) heart failure: Secondary | ICD-10-CM | POA: Diagnosis not present

## 2017-07-26 DIAGNOSIS — M1990 Unspecified osteoarthritis, unspecified site: Secondary | ICD-10-CM | POA: Diagnosis not present

## 2017-07-26 DIAGNOSIS — I480 Paroxysmal atrial fibrillation: Secondary | ICD-10-CM | POA: Diagnosis not present

## 2017-07-26 DIAGNOSIS — I4901 Ventricular fibrillation: Secondary | ICD-10-CM | POA: Diagnosis not present

## 2017-07-26 DIAGNOSIS — E039 Hypothyroidism, unspecified: Secondary | ICD-10-CM | POA: Diagnosis not present

## 2017-07-26 DIAGNOSIS — I255 Ischemic cardiomyopathy: Secondary | ICD-10-CM | POA: Diagnosis not present

## 2017-07-26 DIAGNOSIS — I119 Hypertensive heart disease without heart failure: Secondary | ICD-10-CM | POA: Diagnosis not present

## 2017-07-26 LAB — CUP PACEART INCLINIC DEVICE CHECK
Battery Remaining Longevity: 46 mo
Brady Statistic RV Percent Paced: 0.16 %
HIGH POWER IMPEDANCE MEASURED VALUE: 43.6901
Implantable Lead Implant Date: 20101124
Lead Channel Impedance Value: 375 Ohm
Lead Channel Sensing Intrinsic Amplitude: 6.8 mV
Lead Channel Setting Pacing Amplitude: 2.5 V
Lead Channel Setting Pacing Pulse Width: 0.4 ms
Lead Channel Setting Sensing Sensitivity: 0.5 mV
MDC IDC LEAD LOCATION: 753860
MDC IDC MSMT LEADCHNL RV PACING THRESHOLD AMPLITUDE: 1.25 V
MDC IDC MSMT LEADCHNL RV PACING THRESHOLD AMPLITUDE: 1.25 V
MDC IDC MSMT LEADCHNL RV PACING THRESHOLD PULSEWIDTH: 0.4 ms
MDC IDC MSMT LEADCHNL RV PACING THRESHOLD PULSEWIDTH: 0.4 ms
MDC IDC PG IMPLANT DT: 20101124
MDC IDC SESS DTM: 20181010165125
Pulse Gen Serial Number: 745406

## 2017-07-26 NOTE — Progress Notes (Signed)
ICD check in clinic. Normal device function. Threshold and sensing consistent with previous device measurements. Impedance trends stable over time. No evidence of any ventricular arrhythmias. Histogram distribution appropriate for patient and level of activity. No changes made this session. Device programmed at appropriate safety margins. Device programmed to optimize intrinsic conduction. Estimated longevity 3.25yrs. Pt enrolled in remote follow-up. Patient refuses home monitoring. ROV in DC in 3 mo.

## 2017-10-01 ENCOUNTER — Other Ambulatory Visit: Payer: Self-pay | Admitting: Family

## 2017-11-02 ENCOUNTER — Encounter: Payer: Self-pay | Admitting: Internal Medicine

## 2018-01-08 ENCOUNTER — Ambulatory Visit (INDEPENDENT_AMBULATORY_CARE_PROVIDER_SITE_OTHER): Payer: Medicare Other | Admitting: *Deleted

## 2018-01-08 DIAGNOSIS — I4901 Ventricular fibrillation: Secondary | ICD-10-CM | POA: Diagnosis not present

## 2018-01-08 DIAGNOSIS — I119 Hypertensive heart disease without heart failure: Secondary | ICD-10-CM | POA: Diagnosis not present

## 2018-01-08 DIAGNOSIS — E039 Hypothyroidism, unspecified: Secondary | ICD-10-CM | POA: Diagnosis not present

## 2018-01-08 DIAGNOSIS — E785 Hyperlipidemia, unspecified: Secondary | ICD-10-CM | POA: Diagnosis not present

## 2018-01-08 DIAGNOSIS — M1990 Unspecified osteoarthritis, unspecified site: Secondary | ICD-10-CM | POA: Diagnosis not present

## 2018-01-08 DIAGNOSIS — J309 Allergic rhinitis, unspecified: Secondary | ICD-10-CM | POA: Diagnosis not present

## 2018-01-08 DIAGNOSIS — I480 Paroxysmal atrial fibrillation: Secondary | ICD-10-CM | POA: Diagnosis not present

## 2018-01-08 DIAGNOSIS — I5020 Unspecified systolic (congestive) heart failure: Secondary | ICD-10-CM | POA: Diagnosis not present

## 2018-01-08 DIAGNOSIS — I255 Ischemic cardiomyopathy: Secondary | ICD-10-CM | POA: Diagnosis not present

## 2018-01-08 MED ORDER — PRAVASTATIN SODIUM 40 MG PO TABS: ORAL_TABLET | ORAL | 3 refills | Status: DC

## 2018-01-08 NOTE — Progress Notes (Signed)
ICD check in clinic. Normal device function. Threshold and sensing consistent with previous device measurements. Impedance trend stable over time. No evidence of any ventricular arrhythmias. Histogram distribution appropriate for patient and level of activity. No changes made this session. Device programmed at appropriate safety margins. Device programmed to optimize intrinsic conduction. Estimated longevity 3.19yrs. Patient declines remotes. ROV with GT 7/3.

## 2018-01-08 NOTE — Addendum Note (Signed)
Addended by: Jimmey Ralph on: 01/08/2018 04:49 PM   Modules accepted: Orders

## 2018-03-28 ENCOUNTER — Encounter: Payer: Medicare Other | Admitting: Primary Care

## 2018-03-28 DIAGNOSIS — Z0289 Encounter for other administrative examinations: Secondary | ICD-10-CM

## 2018-04-12 ENCOUNTER — Encounter: Payer: Self-pay | Admitting: Internal Medicine

## 2018-04-18 ENCOUNTER — Encounter: Payer: Self-pay | Admitting: Internal Medicine

## 2018-04-20 ENCOUNTER — Encounter: Payer: Self-pay | Admitting: Internal Medicine

## 2018-06-27 DIAGNOSIS — M1990 Unspecified osteoarthritis, unspecified site: Secondary | ICD-10-CM | POA: Diagnosis not present

## 2018-06-27 DIAGNOSIS — I255 Ischemic cardiomyopathy: Secondary | ICD-10-CM | POA: Diagnosis not present

## 2018-06-27 DIAGNOSIS — I10 Essential (primary) hypertension: Secondary | ICD-10-CM | POA: Diagnosis not present

## 2018-06-27 DIAGNOSIS — I5020 Unspecified systolic (congestive) heart failure: Secondary | ICD-10-CM | POA: Diagnosis not present

## 2018-06-27 DIAGNOSIS — E785 Hyperlipidemia, unspecified: Secondary | ICD-10-CM | POA: Diagnosis not present

## 2018-06-27 DIAGNOSIS — E039 Hypothyroidism, unspecified: Secondary | ICD-10-CM | POA: Diagnosis not present

## 2018-06-27 DIAGNOSIS — J309 Allergic rhinitis, unspecified: Secondary | ICD-10-CM | POA: Diagnosis not present

## 2018-06-28 DIAGNOSIS — E785 Hyperlipidemia, unspecified: Secondary | ICD-10-CM | POA: Diagnosis not present

## 2018-06-28 DIAGNOSIS — I10 Essential (primary) hypertension: Secondary | ICD-10-CM | POA: Diagnosis not present

## 2018-08-03 NOTE — Telephone Encounter (Signed)
error 

## 2018-09-06 ENCOUNTER — Encounter: Payer: Self-pay | Admitting: Internal Medicine

## 2018-09-06 ENCOUNTER — Ambulatory Visit (INDEPENDENT_AMBULATORY_CARE_PROVIDER_SITE_OTHER): Payer: Medicare HMO | Admitting: Internal Medicine

## 2018-09-06 VITALS — BP 144/92 | HR 75 | Ht 64.0 in | Wt 163.4 lb

## 2018-09-06 DIAGNOSIS — Z95810 Presence of automatic (implantable) cardiac defibrillator: Secondary | ICD-10-CM

## 2018-09-06 DIAGNOSIS — I4901 Ventricular fibrillation: Secondary | ICD-10-CM

## 2018-09-06 LAB — CUP PACEART INCLINIC DEVICE CHECK
Brady Statistic RV Percent Paced: 0.07 %
HighPow Impedance: 44 Ohm
Implantable Lead Location: 753860
Lead Channel Impedance Value: 362.5 Ohm
Lead Channel Pacing Threshold Amplitude: 1.25 V
Lead Channel Setting Pacing Amplitude: 2.5 V
Lead Channel Setting Sensing Sensitivity: 0.5 mV
MDC IDC LEAD IMPLANT DT: 20101124
MDC IDC MSMT BATTERY REMAINING LONGEVITY: 39 mo
MDC IDC MSMT LEADCHNL RV PACING THRESHOLD PULSEWIDTH: 0.4 ms
MDC IDC MSMT LEADCHNL RV SENSING INTR AMPL: 8.9 mV
MDC IDC PG IMPLANT DT: 20101124
MDC IDC SESS DTM: 20191121173258
MDC IDC SET LEADCHNL RV PACING PULSEWIDTH: 0.4 ms
Pulse Gen Serial Number: 745406

## 2018-09-06 NOTE — Progress Notes (Signed)
HPI [name] returns today for followup. She is a very pleasant 72 year old woman, with coronary disease, mitral regurgitation, chronic systolic heart failure, status post ventricular fibrillation arrest. She is status post ICD implantation.  She continues to have dietary indiscretion.  Minimal peripheral edema. She denies anginal symptoms. Her heart failure remains class 2. Her main complaint is related to nausea and back pain. She has not had any ICD shocks Allergies  Allergen Reactions  . Morphine And Related Itching and Nausea And Vomiting  . Codeine Nausea And Vomiting  . Meperidine Hcl Nausea And Vomiting  . Pollen Extract Itching and Other (See Comments)    Seasonal allergies     Current Outpatient Medications  Medication Sig Dispense Refill  . acetaminophen (TYLENOL) 500 MG tablet Take 1,000 mg by mouth every 6 (six) hours as needed. For pain    . amiodarone (PACERONE) 200 MG tablet Take amiodarone 200 mg one tablet daily Monday through Saturday.  Do not take amiodarone on Sunday. 60 tablet 1  . amLODipine (NORVASC) 2.5 MG tablet Take 2.5 mg by mouth daily.    Marland Kitchen amoxicillin-clavulanate (AUGMENTIN) 875-125 MG tablet Take 1 tablet by mouth 2 (two) times daily. 20 tablet 0  . aspirin EC 81 MG tablet Take 81 mg by mouth every other day.    Marland Kitchen aspirin-acetaminophen-caffeine (EXCEDRIN MIGRAINE) 250-250-65 MG per tablet Take 1 tablet by mouth every 6 (six) hours as needed for headache.    . hydrOXYzine (ATARAX/VISTARIL) 10 MG tablet Take 1 tablet by mouth daily. For allergies    . ibuprofen (ADVIL,MOTRIN) 400 MG tablet Take 1 tablet (400 mg total) by mouth every 6 (six) hours as needed. (Patient taking differently: Take 400 mg by mouth every 6 (six) hours as needed (pain). ) 30 tablet 0  . levothyroxine (SYNTHROID, LEVOTHROID) 125 MCG tablet TAKE 1 TABLET BY MOUTH DAILY 90 tablet 1  . losartan (COZAAR) 100 MG tablet Take 1 tablet by mouth daily.    . metoprolol succinate  (TOPROL-XL) 50 MG 24 hr tablet Take 50 mg by mouth daily. Take with or immediately following a meal.    . OVER THE COUNTER MEDICATION Place 1 spray into both nostrils daily as needed (nasal congestion). Nasal Spray    . PARoxetine (PAXIL) 30 MG tablet Take 60 mg by mouth daily.     . Polyvinyl Alcohol-Povidone (MURINE TEARS FOR DRY EYES OP) Place 2 drops into both eyes 2 (two) times daily as needed. For dry eyes    . pravastatin (PRAVACHOL) 40 MG tablet TAKE 1 TABLET BY MOUTH EVERY EVENING AS DIRECTED. 30 tablet 3   No current facility-administered medications for this visit.      Past Medical History:  Diagnosis Date  . Anxiety   . Arthritis   . Automatic implantable cardioverter-defibrillator in situ   . CHF (congestive heart failure) ([facility]) 05/2013   class III, severe  . Depression   . GERD (gastroesophageal reflux disease)   . Headache(784.0)    migraines  . Heart murmur   . History of sudden cardiac arrest successfully resuscitated 2011  . HLD (hyperlipidemia)   . Hypertension   . Hypothyroidism   . Paroxysmal atrial fibrillation ([facility])   . Pneumonia   . Scoliosis   . Severe mitral regurgitation 05/2013   s/p MV repair with annuloplasty    ROS:   All systems reviewed and negative except as noted in the HPI.   Past Surgical History:  Procedure Laterality Date  .  ABDOMINAL HYSTERECTOMY    . APPENDECTOMY    . CARDIAC CATHETERIZATION    . CARDIAC DEFIBRILLATOR PLACEMENT  2010   Vfib arrest, single chamber ICD  . CARPAL TUNNEL RELEASE Right 03/05/2014   Procedure: RIGHT CARPAL TUNNEL RELEASE;  Surgeon: Schuyler Amor, MD;  Location: [facility];  Service: Orthopedics;  Laterality: Right;  . COLONOSCOPY    . HERNIA REPAIR  as child   x 2  . INTRAOPERATIVE TRANSESOPHAGEAL ECHOCARDIOGRAM N/A 05/24/2013   Procedure: INTRAOPERATIVE TRANSESOPHAGEAL ECHOCARDIOGRAM;  Surgeon: Ivin Poot, MD;  Location: [facility];  Service: Open Heart Surgery;  Laterality: N/A;  . LEFT HEART  CATHETERIZATION WITH CORONARY ANGIOGRAM N/A 05/16/2013   Procedure: LEFT HEART CATHETERIZATION WITH CORONARY ANGIOGRAM;  Surgeon: Clent Demark, MD;  Location: [facility] CATH [facility];  Service: Cardiovascular;  Laterality: N/A;  . MITRAL VALVE REPAIR N/A 05/24/2013   Surgeon: Ivin Poot, MD; Service: Open Heart Surgery  . MULTIPLE EXTRACTIONS WITH ALVEOLOPLASTY N/A 05/21/2013   Procedure: Extraction of tooth #'s 2,4,18 with alveoloplasty and gross debridement of remaining teeth;  Surgeon: Lenn Cal, DDS;  Location: [facility];  Service: Oral Surgery;  Laterality: N/A;  . OPEN REDUCTION INTERNAL FIXATION (ORIF) DISTAL RADIAL FRACTURE Right 03/05/2014   Procedure: OPEN REDUCTION INTERNAL FIXATION (ORIF) RIGHT DISTAL RADIUS FRACTURE ;  Surgeon: Schuyler Amor, MD;  Location: [facility];  Service: Orthopedics;  Laterality: Right;  . TEE WITHOUT CARDIOVERSION  09/24/2012   Procedure: TRANSESOPHAGEAL ECHOCARDIOGRAM (TEE);  Surgeon: Fay Records, MD;  Location: [facility] ENDOSCOPY;  Service: Cardiovascular;  Laterality: N/A;     Family History  Problem Relation Age of Onset  . Cancer - Other Mother   . CAD Mother   . Arthritis Mother   . CVA Sister   . CAD Sister   . Seizures Brother   . Heart disease Father      Social History   Socioeconomic History  . Marital status: Widowed    Spouse name: Not on file  . Number of children: 1  . Years of education: 92  . Highest education level: Not on file  Occupational History  . Occupation: Retired  Scientific laboratory technician  . Financial resource strain: Not on file  . Food insecurity:    Worry: Not on file    Inability: Not on file  . Transportation needs:    Medical: Not on file    Non-medical: Not on file  Tobacco Use  . Smoking status: Never Smoker  . Smokeless tobacco: Never Used  Substance and Sexual Activity  . Alcohol use: No  . Drug use: No  . Sexual activity: Yes    Birth control/protection: Surgical  Lifestyle  . Physical activity:    Days per week:  Not on file    Minutes per session: Not on file  . Stress: Not on file  Relationships  . Social connections:    Talks on phone: Not on file    Gets together: Not on file    Attends religious service: Not on file    Active member of club or organization: Not on file    Attends meetings of clubs or organizations: Not on file    Relationship status: Not on file  . Intimate partner violence:    Fear of current or ex partner: Not on file    Emotionally abused: Not on file    Physically abused: Not on file    Forced sexual activity: Not on file  Other Topics  Concern  . Not on file  Social History Narrative   Fun: Shop, read, dance   Denies religious beliefs effecting health care.      BP (!) 144/92   Pulse 75   Ht 5\' 4"  (1.626 m)   Wt 163 lb 6.4 oz (74.1 kg)   SpO2 98%   BMI 28.05 kg/m   Physical Exam:  Well appearing NAD HEENT: Unremarkable Neck:  No JVD, no thyromegally Lymphatics:  No adenopathy Back:  No CVA tenderness Lungs:  Clear HEART:  Regular rate rhythm, no murmurs, no rubs, no clicks Abd:  soft, positive bowel sounds, no organomegally, no rebound, no guarding Ext:  2 plus pulses, no edema, no cyanosis, no clubbing Skin:  No rashes no nodules Neuro:  CN II through XII intact, motor grossly intact  EKG - nsr with frequent and multifocal PVC's  DEVICE  Normal device function.  See PaceArt for details.   Assess/Plan: 1. Chronic systolic heart failure - she appears to be class 2. She will continue her current meds. She denies dietary indiscretion. 2. ICD - she has had no therapies. Her device longevity is about 3 years 3. VT/VF - she has not had any additional symptoms and device interogation demonstrates no more VT/VF. She will reduce her dose of amiodarone to  200 mg daily Monday-Friday. 4. HTN - her blood pressure is well controlled. We will follow.  Mikle Bosworth.D.

## 2018-09-06 NOTE — Patient Instructions (Signed)
Medication Instructions:  Your physician recommends that you continue on your current medications as directed. Please refer to the Current Medication list given to you today.  Labwork: None ordered.  Testing/Procedures: None ordered.  Follow-Up:  Your physician wants you to follow-up in: 3 months with [facility] for a DEVICE CHECK.  Your physician wants you to follow-up in: one year with Dr. Lovena Le.   You will receive a reminder letter in the mail two months in advance. If you don't receive a letter, please call our office to schedule the follow-up appointment.  Any Other Special Instructions Will Be Listed Below (If Applicable).  If you need a refill on your cardiac medications before your next appointment, please call your pharmacy.

## 2018-09-11 ENCOUNTER — Encounter: Payer: Self-pay | Admitting: Internal Medicine

## 2018-10-06 ENCOUNTER — Other Ambulatory Visit: Payer: Self-pay | Admitting: Cardiology

## 2018-10-31 ENCOUNTER — Other Ambulatory Visit: Payer: Self-pay

## 2018-10-31 ENCOUNTER — Emergency Department
Admission: EM | Admit: 2018-10-31 | Discharge: 2018-10-31 | Disposition: A | Discharge status: Home / Self Care | Payer: Medicare HMO | Attending: Emergency Medicine | Admitting: Emergency Medicine

## 2018-10-31 ENCOUNTER — Encounter: Payer: Self-pay | Admitting: Emergency Medicine

## 2018-10-31 ENCOUNTER — Emergency Department: Payer: Medicare HMO

## 2018-10-31 DIAGNOSIS — M542 Cervicalgia: Secondary | ICD-10-CM | POA: Insufficient documentation

## 2018-10-31 DIAGNOSIS — M546 Pain in thoracic spine: Secondary | ICD-10-CM | POA: Diagnosis not present

## 2018-10-31 DIAGNOSIS — Z79899 Other long term (current) drug therapy: Secondary | ICD-10-CM | POA: Diagnosis not present

## 2018-10-31 DIAGNOSIS — W19XXXA Unspecified fall, initial encounter: Secondary | ICD-10-CM

## 2018-10-31 DIAGNOSIS — I509 Heart failure, unspecified: Secondary | ICD-10-CM | POA: Diagnosis not present

## 2018-10-31 DIAGNOSIS — S299XXA Unspecified injury of thorax, initial encounter: Secondary | ICD-10-CM | POA: Diagnosis not present

## 2018-10-31 DIAGNOSIS — E039 Hypothyroidism, unspecified: Secondary | ICD-10-CM | POA: Insufficient documentation

## 2018-10-31 DIAGNOSIS — S0990XA Unspecified injury of head, initial encounter: Secondary | ICD-10-CM | POA: Diagnosis not present

## 2018-10-31 DIAGNOSIS — Z7982 Long term (current) use of aspirin: Secondary | ICD-10-CM | POA: Insufficient documentation

## 2018-10-31 DIAGNOSIS — M549 Dorsalgia, unspecified: Secondary | ICD-10-CM | POA: Diagnosis not present

## 2018-10-31 DIAGNOSIS — S199XXA Unspecified injury of neck, initial encounter: Secondary | ICD-10-CM | POA: Diagnosis not present

## 2018-10-31 DIAGNOSIS — S3992XA Unspecified injury of lower back, initial encounter: Secondary | ICD-10-CM | POA: Diagnosis not present

## 2018-10-31 DIAGNOSIS — I110 Hypertensive heart disease with heart failure: Secondary | ICD-10-CM | POA: Insufficient documentation

## 2018-10-31 MED ORDER — ONDANSETRON HCL 4 MG PO TABS: 4.0000 mg | ORAL_TABLET | Freq: Three times a day (TID) | ORAL | 0 refills | Status: AC | PRN

## 2018-10-31 MED ORDER — TRAMADOL HCL 50 MG PO TABS: 50.0000 mg | ORAL_TABLET | Freq: Four times a day (QID) | ORAL | 0 refills | Status: AC | PRN

## 2018-10-31 NOTE — ED Triage Notes (Signed)
Pt in via POV, reports mechanical fall 2 days ago, slipping on steps outside, landing on back.  Pt reports worsening back pain.  Denies LOC w/ fall, denies blood thinners.  Bruising and excoriation noted to mid back.  Ambulatory to triage, NAD noted.

## 2018-10-31 NOTE — ED Triage Notes (Addendum)
FIRST NURSE NOTE-pt reports she fell.  Abrasion to cheek ntoed. Ambulatory without distress.

## 2018-10-31 NOTE — ED Provider Notes (Signed)
[facility] Emergency Department Provider Note  ____________________________________________  Time seen: Approximately 4:45 PM  I have reviewed the triage vital signs and the nursing notes.   HISTORY  Chief Complaint Fall and Back Pain    HPI [name] is a 73 y.o. female with a history of scoliosis,  presents to the emergency department after a mechanical, non-syncopal fall 2 days ago.  Patient slipped on concrete steps outside and landed on her back.  Patient denies loss of consciousness.  Denies current blood thinner use.  She reports 8 out of neck pain and upper back pain.  No numbness or tingling in the upper or lower extremities.  No changes in vision or subjective weakness. No sustained lacerations or abrasions.  Patient has been able to ambulate since fall occurred.  No alleviating measures have been attempted.   Past Medical History:  Diagnosis Date  . Anxiety   . Arthritis   . Automatic implantable cardioverter-defibrillator in situ   . CHF (congestive heart failure) ([place]) 05/2013   class III, severe  . Depression   . GERD (gastroesophageal reflux disease)   . Headache(784.0)    migraines  . Heart murmur   . History of sudden cardiac arrest successfully resuscitated 2011  . HLD (hyperlipidemia)   . Hypertension   . Hypothyroidism   . Paroxysmal atrial fibrillation (H[facility])   . Pneumonia   . Scoliosis   . Severe mitral regurgitation 05/2013   s/p MV repair with annuloplasty    Patient Active Problem List   Diagnosis Date Noted  . Acute non-recurrent pansinusitis 10/03/2016  . Right shoulder pain 06/21/2016  . Bilateral knee pain 06/21/2016  . Scoliosis 02/04/2015  . Ventricular fibrillation ([place]) 11/03/2014  . Anxiety state, unspecified 05/15/2013  . Atrial fibrillation ([place]) 05/15/2013  . Mitral regurgitation 09/20/2012  . Chronic systolic heart failure ([place]) 04/20/2010  . Automatic implantable cardioverter-defibrillator in situ  04/20/2010  . Hypothyroidism 12/28/2009  . HYPERLIPIDEMIA 12/28/2009  . Essential hypertension 12/28/2009  . CARDIAC ARRHYTHMIA 12/28/2009  . CHF 12/28/2009  . IBS 12/28/2009    Past Surgical History:  Procedure Laterality Date  . ABDOMINAL HYSTERECTOMY    . APPENDECTOMY    . CARDIAC CATHETERIZATION    . CARDIAC DEFIBRILLATOR PLACEMENT  2010   Vfib arrest, single chamber ICD  . CARPAL TUNNEL RELEASE Right 03/05/2014   Procedure: RIGHT CARPAL TUNNEL RELEASE;  Surgeon: Schuyler Amor, MD;  Location: [place];  Service: Orthopedics;  Laterality: Right;  . COLONOSCOPY    . HERNIA REPAIR  as child   x 2  . INTRAOPERATIVE TRANSESOPHAGEAL ECHOCARDIOGRAM N/A 05/24/2013   Procedure: INTRAOPERATIVE TRANSESOPHAGEAL ECHOCARDIOGRAM;  Surgeon: Ivin Poot, MD;  Location: [place];  Service: Open Heart Surgery;  Laterality: N/A;  . LEFT HEART CATHETERIZATION WITH CORONARY ANGIOGRAM N/A 05/16/2013   Procedure: LEFT HEART CATHETERIZATION WITH CORONARY ANGIOGRAM;  Surgeon: Clent Demark, MD;  Location: [place] CATH LAB;  Service: Cardiovascular;  Laterality: N/A;  . MITRAL VALVE REPAIR N/A 05/24/2013   Surgeon: Ivin Poot, MD; Service: Open Heart Surgery  . MULTIPLE EXTRACTIONS WITH ALVEOLOPLASTY N/A 05/21/2013   Procedure: Extraction of tooth #'s 2,4,18 with alveoloplasty and gross debridement of remaining teeth;  Surgeon: Lenn Cal, DDS;  Location: [place];  Service: Oral Surgery;  Laterality: N/A;  . OPEN REDUCTION INTERNAL FIXATION (ORIF) DISTAL RADIAL FRACTURE Right 03/05/2014   Procedure: OPEN REDUCTION INTERNAL FIXATION (ORIF) RIGHT DISTAL RADIUS FRACTURE ;  Surgeon: Schuyler Amor, MD;  Location: [place];  Service: Orthopedics;  Laterality: Right;  . TEE WITHOUT CARDIOVERSION  09/24/2012   Procedure: TRANSESOPHAGEAL ECHOCARDIOGRAM (TEE);  Surgeon: Fay Records, MD;  Location: [facility] ENDOSCOPY;  Service: Cardiovascular;  Laterality: N/A;    Prior to Admission medications   Medication Sig Start  Date End Date Taking? Authorizing Provider  acetaminophen (TYLENOL) 500 MG tablet Take 1,000 mg by mouth every 6 (six) hours as needed. For pain    [provider]  amiodarone (PACERONE) 200 MG tablet Take amiodarone 200 mg one tablet daily Monday through Saturday.  Do not take amiodarone on Sunday. 04/24/17   Evans Lance, MD  amLODipine (NORVASC) 2.5 MG tablet Take 2.5 mg by mouth daily.    [provider]  amoxicillin-clavulanate (AUGMENTIN) 875-125 MG tablet Take 1 tablet by mouth 2 (two) times daily. 10/03/16   Golden Circle, FNP  aspirin EC 81 MG tablet Take 81 mg by mouth every other day.    [provider]  aspirin-acetaminophen-caffeine (EXCEDRIN MIGRAINE) [phone] MG per tablet Take 1 tablet by mouth every 6 (six) hours as needed for headache.    [provider]  hydrOXYzine (ATARAX/VISTARIL) 10 MG tablet Take 1 tablet by mouth daily. For allergies 11/04/15   [provider]  ibuprofen (ADVIL,MOTRIN) 400 MG tablet Take 1 tablet (400 mg total) by mouth every 6 (six) hours as needed. Patient taking differently: Take 400 mg by mouth every 6 (six) hours as needed (pain).  02/23/14   Varney Biles, MD  levothyroxine (SYNTHROID, LEVOTHROID) 125 MCG tablet TAKE 1 TABLET BY MOUTH DAILY 12/06/16   Golden Circle, FNP  losartan (COZAAR) 100 MG tablet Take 1 tablet by mouth daily. 10/27/14   [provider]  metoprolol succinate (TOPROL-XL) 50 MG 24 hr tablet Take 50 mg by mouth daily. Take with or immediately following a meal.    [provider]  ondansetron (ZOFRAN) 4 MG tablet Take 1 tablet (4 mg total) by mouth every 8 (eight) hours as needed for up to 3 days for nausea or vomiting. 10/31/18 11/03/18  Lannie Fields, PA-C  OVER THE COUNTER MEDICATION Place 1 spray into both nostrils daily as needed (nasal congestion). Nasal Spray    [provider]  PARoxetine (PAXIL) 30 MG tablet Take 60 mg by mouth daily.     [provider]  Polyvinyl Alcohol-Povidone (MURINE TEARS FOR DRY EYES OP) Place 2 drops into both eyes 2 (two) times daily as needed. For dry eyes    [provider]  pravastatin (PRAVACHOL) 40 MG tablet TAKE 1 TABLET BY MOUTH EVERY DAY IN THE EVENING AS DIRECTED 10/08/18   Dorothy Spark, MD  traMADol (ULTRAM) 50 MG tablet Take 1 tablet (50 mg total) by mouth every 6 (six) hours as needed for up to 3 days. 10/31/18 11/03/18  Lannie Fields, PA-C    Allergies Morphine and related; Codeine; Demerol [meperidine hcl]; Meperidine hcl; and Pollen extract  Family History  Problem Relation Age of Onset  . Cancer - Other Mother   . CAD Mother   . Arthritis Mother   . CVA Sister   . CAD Sister   . Seizures Brother   . Heart disease Father     Social History Social History   Tobacco Use  . Smoking status: Never Smoker  . Smokeless tobacco: Never Used  Substance Use Topics  . Alcohol use: No  . Drug use: No     Review of  Systems  Constitutional: No fever/chills Eyes: No visual changes. No discharge ENT: No upper respiratory complaints. Cardiovascular: no chest pain. Respiratory: no cough. No SOB. Gastrointestinal: No abdominal pain.  No nausea, no vomiting.  No diarrhea.  No constipation. Musculoskeletal: Patient has neck pain and upper back pain.  Skin: Negative for rash, abrasions, lacerations, ecchymosis. Neurological: Negative for headaches, focal weakness or numbness. ____________________________________________   PHYSICAL EXAM:  VITAL SIGNS: ED Triage Vitals  Enc Vitals Group     BP 10/31/18 1600 (!) 150/121     Pulse Rate 10/31/18 1600 (!) 56     Resp --      Temp 10/31/18 1600 98.2 F (36.8 C)     Temp Source 10/31/18 1600 Oral     SpO2 10/31/18 1600 95 %     Weight 10/31/18 1601 165 lb (74.8 kg)     Height 10/31/18 1601 5\' 4"  (1.626 m)     Head Circumference --      Peak Flow --      Pain Score 10/31/18 1606 9     Pain Loc --      Pain Edu? --       Excl. in [place]? --      Constitutional: Alert and oriented. Well appearing and in no acute distress. Eyes: Conjunctivae are normal. PERRL. EOMI. Head: Atraumatic. ENT:      Ears: TMs are pearly.      Nose: No congestion/rhinnorhea.      Mouth/Throat: Mucous membranes are moist.  Neck: No stridor.  No cervical spine tenderness to palpation.  Cardiovascular: Normal rate, regular rhythm. Normal S1 and S2.  Good peripheral circulation. Respiratory: Normal respiratory effort without tachypnea or retractions. Lungs CTAB. Good air entry to the bases with no decreased or absent breath sounds. Gastrointestinal: Bowel sounds 4 quadrants. Soft and nontender to palpation. No guarding or rigidity. No palpable masses. No distention. No CVA tenderness. Musculoskeletal: Full range of motion to all extremities. No gross deformities appreciated.  Patient has some paraspinal muscle tenderness along the thoracic spine. Neurologic:  Normal speech and language. No gross focal neurologic deficits are appreciated.  Skin: Midline traumatic bruising in the distribution of T10-T12 visualized. Psychiatric: Mood and affect are normal. Speech and behavior are normal. Patient exhibits appropriate insight and judgement.   ____________________________________________   LABS (all labs ordered are listed, but only abnormal results are displayed)  Labs Reviewed - No data to display ____________________________________________  EKG   ____________________________________________  RADIOLOGY I personally viewed and evaluated these images as part of my medical decision making, as well as reviewing the written report by the radiologist.    Dg Thoracic Spine 2 View  Result Date: 10/31/2018 CLINICAL DATA:  73 year old female with progressive back pain after falling on the steps outside 2 days previously. EXAM: THORACIC SPINE 2 VIEWS COMPARISON:  Concurrently obtained radiographs of the lumbar spine FINDINGS: Right  subclavian approach intracardiac defibrillator. Cardiomegaly. Surgical changes of prior mitral valve annuloplasty. No evidence of pulmonary edema, pleural effusion or pneumothorax. No evidence of acute fracture or malalignment. IMPRESSION: 1. No evidence of acute thoracic spine fracture or malalignment. Electronically Signed   By: Jacqulynn Cadet M.D.   On: 10/31/2018 17:23   Dg Lumbar Spine 2-3 Views  Result Date: 10/31/2018 CLINICAL DATA:  73 year old female with progressive back pain after slipping and falling on her outside steps 2 days previously. EXAM: LUMBAR SPINE - 2-3 VIEW COMPARISON:  Concurrently obtained radiographs of the thoracic spine FINDINGS: There is no  evidence of acute fracture or malalignment. The vertebral body heights are maintained. Minimal digits of degenerative disc disease. The intervertebral disc spaces are largely maintained. The visualized sacrum and iliac bones appear to be intact. No evidence of bowel obstruction. No focal bony lesion. IMPRESSION: Negative. Electronically Signed   By: Jacqulynn Cadet M.D.   On: 10/31/2018 17:24   Ct Head Wo Contrast  Result Date: 10/31/2018 CLINICAL DATA:  Mechanical fall 2 days ago, slipped on steps outside landing on back, worsening back pain EXAM: CT HEAD WITHOUT CONTRAST CT CERVICAL SPINE WITHOUT CONTRAST TECHNIQUE: Multidetector CT imaging of the head and cervical spine was performed following the standard protocol without intravenous contrast. Multiplanar CT image reconstructions of the cervical spine were also generated. COMPARISON:  None FINDINGS: CT HEAD FINDINGS Brain: Mild generalized atrophy. Normal ventricular morphology. No midline shift or mass effect. Minimal small vessel chronic ischemic changes of deep cerebral white matter. No intracranial hemorrhage, mass lesion or evidence of acute infarction. No extra-axial fluid collections. Vascular: No hyperdense vessels. Mild atherosclerotic calcifications of internal carotid  arteries bilaterally at skull base Skull: Intact. 11 x 12 mm focus of osseous thickening and ground-glass attenuation at the anterior aspect of the medial wall of the RIGHT maxillary sinus question fibrous dysplasia. Sinuses/Orbits: Clear Other: N/A CT CERVICAL SPINE FINDINGS Alignment: Grossly normal.  Minimal motion artifacts noted. Skull base and vertebrae: Osseous demineralization. Skull base intact. Vertebral body heights maintained. Minimal disc space narrowing and endplate spur formation at C4-C5 and C5-C6. Scattered facet degenerative changes greater on RIGHT. No acute fracture, subluxation, or bone destruction. Soft tissues and spinal canal: Prevertebral soft tissues normal thickness. Disc levels:  No additional abnormalities Upper chest: Calcified granuloma RIGHT upper lobe. Lung apices otherwise clear. Other: N/A IMPRESSION: Atrophy with minimal small vessel chronic ischemic changes of deep cerebral white matter. No acute intracranial abnormalities. Degenerative disc and facet disease changes of the cervical spine. No acute cervical spine abnormalities. Electronically Signed   By: Lavonia Dana M.D.   On: 10/31/2018 17:14   Ct Cervical Spine Wo Contrast  Result Date: 10/31/2018 CLINICAL DATA:  Mechanical fall 2 days ago, slipped on steps outside landing on back, worsening back pain EXAM: CT HEAD WITHOUT CONTRAST CT CERVICAL SPINE WITHOUT CONTRAST TECHNIQUE: Multidetector CT imaging of the head and cervical spine was performed following the standard protocol without intravenous contrast. Multiplanar CT image reconstructions of the cervical spine were also generated. COMPARISON:  None FINDINGS: CT HEAD FINDINGS Brain: Mild generalized atrophy. Normal ventricular morphology. No midline shift or mass effect. Minimal small vessel chronic ischemic changes of deep cerebral white matter. No intracranial hemorrhage, mass lesion or evidence of acute infarction. No extra-axial fluid collections. Vascular: No  hyperdense vessels. Mild atherosclerotic calcifications of internal carotid arteries bilaterally at skull base Skull: Intact. 11 x 12 mm focus of osseous thickening and ground-glass attenuation at the anterior aspect of the medial wall of the RIGHT maxillary sinus question fibrous dysplasia. Sinuses/Orbits: Clear Other: N/A CT CERVICAL SPINE FINDINGS Alignment: Grossly normal.  Minimal motion artifacts noted. Skull base and vertebrae: Osseous demineralization. Skull base intact. Vertebral body heights maintained. Minimal disc space narrowing and endplate spur formation at C4-C5 and C5-C6. Scattered facet degenerative changes greater on RIGHT. No acute fracture, subluxation, or bone destruction. Soft tissues and spinal canal: Prevertebral soft tissues normal thickness. Disc levels:  No additional abnormalities Upper chest: Calcified granuloma RIGHT upper lobe. Lung apices otherwise clear. Other: N/A IMPRESSION: Atrophy with minimal small vessel chronic ischemic  changes of deep cerebral white matter. No acute intracranial abnormalities. Degenerative disc and facet disease changes of the cervical spine. No acute cervical spine abnormalities. Electronically Signed   By: Lavonia Dana M.D.   On: 10/31/2018 17:14    ____________________________________________    PROCEDURES  Procedure(s) performed:    Procedures    Medications - No data to display   ____________________________________________   INITIAL IMPRESSION / ASSESSMENT AND PLAN / ED COURSE  Pertinent labs & imaging results that were available during my care of the patient were reviewed by me and considered in my medical decision making (see chart for details).  Review of the [place] CSRS was performed in accordance of the [place] prior to dispensing any controlled drugs.    Assessment and plan:  Fall:  Patient presents to the emergency department after a mechanical, non-syncopal fall after patient slipped coming down concrete steps.  CT head  and CT cervical spine were obtained which revealed no acute abnormality.  X-ray examination of the thoracic spine and lumbar spine revealed no acute fractures.  Patient has some paraspinal muscle tenderness along the thoracic spine with some traumatic bruising visualized on physical exam.  Patient's overall neurologic exam was reassuring.  Patient was discharged with a short course of tramadol.  Strict return precautions were given to return to the emergency department with new or worsening symptoms.  All patient questions were answered.  ____________________________________________  FINAL CLINICAL IMPRESSION(S) / ED DIAGNOSES  Final diagnoses:  Fall, initial encounter      NEW MEDICATIONS STARTED DURING THIS VISIT:  ED Discharge Orders         Ordered    traMADol (ULTRAM) 50 MG tablet  Every 6 hours PRN     10/31/18 1740    ondansetron (ZOFRAN) 4 MG tablet  Every 8 hours PRN     10/31/18 1740              This chart was dictated using voice recognition software/Dragon. Despite best efforts to proofread, errors can occur which can change the meaning. Any change was purely unintentional.    Lannie Fields, PA-C 10/31/18 1749    Orbie Pyo, MD 11/02/18 1536

## 2018-11-29 ENCOUNTER — Telehealth: Payer: Self-pay

## 2018-11-29 NOTE — Telephone Encounter (Signed)
I spoke with the patient to confirm her PCP.  She said that Medicare assigned her to Dr. Sanda Klein, and she will be making an appointment after she sees her dentist. VDM (DD)

## 2018-12-07 ENCOUNTER — Encounter: Payer: Self-pay | Admitting: Internal Medicine

## 2018-12-12 ENCOUNTER — Encounter: Payer: Medicare HMO | Admitting: Nurse Practitioner

## 2019-07-23 ENCOUNTER — Other Ambulatory Visit: Payer: Self-pay

## 2019-07-23 ENCOUNTER — Ambulatory Visit (INDEPENDENT_AMBULATORY_CARE_PROVIDER_SITE_OTHER): Payer: Medicare HMO | Admitting: *Deleted

## 2019-07-23 DIAGNOSIS — E039 Hypothyroidism, unspecified: Secondary | ICD-10-CM | POA: Diagnosis not present

## 2019-07-23 DIAGNOSIS — M1990 Unspecified osteoarthritis, unspecified site: Secondary | ICD-10-CM | POA: Diagnosis not present

## 2019-07-23 DIAGNOSIS — I5020 Unspecified systolic (congestive) heart failure: Secondary | ICD-10-CM | POA: Diagnosis not present

## 2019-07-23 DIAGNOSIS — I10 Essential (primary) hypertension: Secondary | ICD-10-CM | POA: Diagnosis not present

## 2019-07-23 DIAGNOSIS — E785 Hyperlipidemia, unspecified: Secondary | ICD-10-CM | POA: Diagnosis not present

## 2019-07-23 DIAGNOSIS — I255 Ischemic cardiomyopathy: Secondary | ICD-10-CM | POA: Diagnosis not present

## 2019-07-23 DIAGNOSIS — I480 Paroxysmal atrial fibrillation: Secondary | ICD-10-CM | POA: Diagnosis not present

## 2019-07-23 DIAGNOSIS — I4901 Ventricular fibrillation: Secondary | ICD-10-CM | POA: Diagnosis not present

## 2019-07-23 NOTE — Patient Instructions (Signed)
Return to device clinic in 3 months for device check.

## 2019-07-24 LAB — CUP PACEART INCLINIC DEVICE CHECK
Battery Remaining Longevity: 33 mo
Brady Statistic RV Percent Paced: 0.19 %
Date Time Interrogation Session: 20201006151451
HighPow Impedance: 43.0198
Implantable Lead Implant Date: 20101124
Implantable Lead Location: 753860
Implantable Pulse Generator Implant Date: 20101124
Lead Channel Impedance Value: 362.5 Ohm
Lead Channel Pacing Threshold Amplitude: 1.5 V
Lead Channel Pacing Threshold Pulse Width: 0.4 ms
Lead Channel Sensing Intrinsic Amplitude: 6.7 mV
Lead Channel Setting Pacing Amplitude: 2.75 V
Lead Channel Setting Pacing Pulse Width: 0.4 ms
Lead Channel Setting Sensing Sensitivity: 0.5 mV
Pulse Gen Serial Number: 745406

## 2019-07-24 NOTE — Progress Notes (Signed)
ICD check in clinic. Normal device function. Thresholds and sensing consistent with previous device measurements. Impedance trends stable over time. No evidence of any ventricular arrhythmias. Histogram distribution appropriate for patient and level of activity. No changes made this session. Device programmed at appropriate safety margins. Device programmed to optimize intrinsic conduction. Estimated longevity 2 yrs 9 months. Pt not enrolled in remote f/u, will have in clinic device check every 3 months and next check scheduled for 10/22/19 .  Patient education, shock plan reviewed.

## 2019-08-29 ENCOUNTER — Other Ambulatory Visit: Payer: Self-pay | Admitting: Cardiology

## 2019-09-11 ENCOUNTER — Ambulatory Visit: Payer: Medicare HMO | Admitting: Primary Care

## 2019-10-02 ENCOUNTER — Ambulatory Visit: Payer: Medicare HMO | Admitting: Primary Care

## 2019-10-29 DIAGNOSIS — I10 Essential (primary) hypertension: Secondary | ICD-10-CM | POA: Diagnosis not present

## 2019-10-29 DIAGNOSIS — E039 Hypothyroidism, unspecified: Secondary | ICD-10-CM | POA: Diagnosis not present

## 2019-10-29 DIAGNOSIS — E785 Hyperlipidemia, unspecified: Secondary | ICD-10-CM | POA: Diagnosis not present

## 2019-11-13 DIAGNOSIS — I480 Paroxysmal atrial fibrillation: Secondary | ICD-10-CM | POA: Diagnosis not present

## 2019-11-13 DIAGNOSIS — I10 Essential (primary) hypertension: Secondary | ICD-10-CM | POA: Diagnosis not present

## 2019-11-13 DIAGNOSIS — E785 Hyperlipidemia, unspecified: Secondary | ICD-10-CM | POA: Diagnosis not present

## 2019-11-13 DIAGNOSIS — M1990 Unspecified osteoarthritis, unspecified site: Secondary | ICD-10-CM | POA: Diagnosis not present

## 2019-11-13 DIAGNOSIS — E039 Hypothyroidism, unspecified: Secondary | ICD-10-CM | POA: Diagnosis not present

## 2019-11-13 DIAGNOSIS — I5020 Unspecified systolic (congestive) heart failure: Secondary | ICD-10-CM | POA: Diagnosis not present

## 2019-11-13 DIAGNOSIS — I255 Ischemic cardiomyopathy: Secondary | ICD-10-CM | POA: Diagnosis not present

## 2019-12-13 DIAGNOSIS — R079 Chest pain, unspecified: Secondary | ICD-10-CM | POA: Diagnosis not present

## 2019-12-23 ENCOUNTER — Other Ambulatory Visit: Payer: Self-pay | Admitting: Internal Medicine

## 2020-01-17 ENCOUNTER — Other Ambulatory Visit: Payer: Self-pay | Admitting: Internal Medicine

## 2020-02-19 ENCOUNTER — Other Ambulatory Visit: Payer: Self-pay | Admitting: Internal Medicine

## 2020-03-06 ENCOUNTER — Telehealth: Payer: Self-pay | Admitting: General Practice

## 2020-03-06 NOTE — Telephone Encounter (Signed)
It looks like she never established care with me so she will need to be set up with someone else.  I am no longer accepting new patients that have not previously establish care.

## 2020-03-06 NOTE — Telephone Encounter (Signed)
Patient called today to r/s cancelled new patient appointment She stated the one back in 09/2019 she needed to cancel for a death in the family. She would like to know if she can reschedule this with you    Is this okay?

## 2020-03-12 ENCOUNTER — Other Ambulatory Visit: Payer: Self-pay | Admitting: Internal Medicine

## 2020-03-20 ENCOUNTER — Other Ambulatory Visit: Payer: Self-pay | Admitting: Internal Medicine

## 2020-04-06 DIAGNOSIS — J029 Acute pharyngitis, unspecified: Secondary | ICD-10-CM | POA: Diagnosis not present

## 2020-04-06 DIAGNOSIS — I10 Essential (primary) hypertension: Secondary | ICD-10-CM | POA: Diagnosis not present

## 2020-04-06 DIAGNOSIS — I5020 Unspecified systolic (congestive) heart failure: Secondary | ICD-10-CM | POA: Diagnosis not present

## 2020-04-06 DIAGNOSIS — I480 Paroxysmal atrial fibrillation: Secondary | ICD-10-CM | POA: Diagnosis not present

## 2020-04-06 DIAGNOSIS — I255 Ischemic cardiomyopathy: Secondary | ICD-10-CM | POA: Diagnosis not present

## 2020-04-06 DIAGNOSIS — E785 Hyperlipidemia, unspecified: Secondary | ICD-10-CM | POA: Diagnosis not present

## 2020-04-07 DIAGNOSIS — Z20822 Contact with and (suspected) exposure to covid-19: Secondary | ICD-10-CM | POA: Diagnosis not present

## 2020-04-09 ENCOUNTER — Other Ambulatory Visit: Payer: Self-pay | Admitting: Internal Medicine

## 2020-05-08 ENCOUNTER — Other Ambulatory Visit: Payer: Self-pay | Admitting: Internal Medicine

## 2020-05-20 ENCOUNTER — Ambulatory Visit: Payer: Medicare HMO | Admitting: Internal Medicine

## 2020-05-20 ENCOUNTER — Telehealth: Payer: Self-pay | Admitting: Radiology

## 2020-05-20 ENCOUNTER — Encounter: Payer: Self-pay | Admitting: Internal Medicine

## 2020-05-20 ENCOUNTER — Other Ambulatory Visit: Payer: Self-pay

## 2020-05-20 VITALS — BP 134/74 | HR 52 | Ht 64.0 in | Wt 171.0 lb

## 2020-05-20 DIAGNOSIS — I5022 Chronic systolic (congestive) heart failure: Secondary | ICD-10-CM

## 2020-05-20 DIAGNOSIS — I4891 Unspecified atrial fibrillation: Secondary | ICD-10-CM | POA: Diagnosis not present

## 2020-05-20 DIAGNOSIS — Z95810 Presence of automatic (implantable) cardiac defibrillator: Secondary | ICD-10-CM

## 2020-05-20 DIAGNOSIS — I4901 Ventricular fibrillation: Secondary | ICD-10-CM | POA: Diagnosis not present

## 2020-05-20 LAB — CUP PACEART INCLINIC DEVICE CHECK
Battery Remaining Longevity: 24 mo
Brady Statistic RV Percent Paced: 0.26 %
Date Time Interrogation Session: 20210804152456
HighPow Impedance: 42.9961
Implantable Lead Implant Date: 20101124
Implantable Lead Location: 753860
Implantable Pulse Generator Implant Date: 20101124
Lead Channel Impedance Value: 375 Ohm
Lead Channel Pacing Threshold Amplitude: 1.25 V
Lead Channel Pacing Threshold Amplitude: 1.25 V
Lead Channel Pacing Threshold Pulse Width: 0.4 ms
Lead Channel Pacing Threshold Pulse Width: 0.4 ms
Lead Channel Sensing Intrinsic Amplitude: 6.3 mV
Lead Channel Setting Pacing Amplitude: 2.75 V
Lead Channel Setting Pacing Pulse Width: 0.4 ms
Lead Channel Setting Sensing Sensitivity: 0.5 mV
Pulse Gen Serial Number: 745406

## 2020-05-20 MED ORDER — METOPROLOL SUCCINATE ER 25 MG PO TB24: 25.0000 mg | ORAL_TABLET | Freq: Every day | ORAL | 3 refills | Status: DC

## 2020-05-20 NOTE — Progress Notes (Signed)
HPI [name] returns today after an almost 2 year absence from or EP clinic. She is a pleasant 74 yo woman with a h/o chronic systolic heart failure, VF arrest s/p ICD insertion, mitral regurge, and chronic amio therapy for prevention of her ventricular arrhythmias. She denies chest pain. She is limited by her knees. She denies peripheral edema. No syncope or ICD shock. No palpitations.  Allergies  Allergen Reactions  . Morphine And Related Itching and Nausea And Vomiting  . Codeine Nausea And Vomiting  . Demerol [Meperidine Hcl] Nausea And Vomiting  . Meperidine Hcl Nausea And Vomiting  . Pollen Extract Itching and Other (See Comments)    Seasonal allergies     Current Outpatient Medications  Medication Sig Dispense Refill  . acetaminophen (TYLENOL) 500 MG tablet Take 1,000 mg by mouth every 6 (six) hours as needed. For pain    . amiodarone (PACERONE) 200 MG tablet Take amiodarone 200 mg one tablet daily Monday through Saturday.  Do not take amiodarone on Sunday. 60 tablet 1  . amLODipine (NORVASC) 2.5 MG tablet Take 2.5 mg by mouth daily.    Marland Kitchen amoxicillin-clavulanate (AUGMENTIN) 875-125 MG tablet Take 1 tablet by mouth 2 (two) times daily. 20 tablet 0  . aspirin EC 81 MG tablet Take 81 mg by mouth every other day.    Marland Kitchen aspirin-acetaminophen-caffeine (EXCEDRIN MIGRAINE) 250-250-65 MG per tablet Take 1 tablet by mouth every 6 (six) hours as needed for headache.    Marland Kitchen ENTRESTO 49-51 MG Take 49-51 tablets by mouth in the morning and at bedtime.    . hydrOXYzine (ATARAX/VISTARIL) 10 MG tablet Take 1 tablet by mouth daily. For allergies    . ibuprofen (ADVIL,MOTRIN) 400 MG tablet Take 1 tablet (400 mg total) by mouth every 6 (six) hours as needed. (Patient taking differently: Take 400 mg by mouth every 6 (six) hours as needed (pain). ) 30 tablet 0  . levothyroxine (SYNTHROID) 100 MCG tablet Take 100 mcg by mouth daily.    Marland Kitchen losartan (COZAAR) 100 MG tablet Take 1 tablet by mouth  daily.    . metoprolol succinate (TOPROL-XL) 50 MG 24 hr tablet Take 50 mg by mouth daily. Take with or immediately following a meal.    . OVER THE COUNTER MEDICATION Place 1 spray into both nostrils daily as needed (nasal congestion). Nasal Spray    . PARoxetine (PAXIL) 30 MG tablet Take 60 mg by mouth daily.     . Polyvinyl Alcohol-Povidone (MURINE TEARS FOR DRY EYES OP) Place 2 drops into both eyes 2 (two) times daily as needed. For dry eyes    . pravastatin (PRAVACHOL) 40 MG tablet TAKE 1 TABLET BY MOUTH EVERY EVENING 30 tablet 11   No current facility-administered medications for this visit.     Past Medical History:  Diagnosis Date  . Anxiety   . Arthritis   . Automatic implantable cardioverter-defibrillator in situ   . CHF (congestive heart failure) ([place]) 05/2013   class III, severe  . Depression   . GERD (gastroesophageal reflux disease)   . Headache(784.0)    migraines  . Heart murmur   . History of sudden cardiac arrest successfully resuscitated 2011  . HLD (hyperlipidemia)   . Hypertension   . Hypothyroidism   . Paroxysmal atrial fibrillation (HCC)   . Pneumonia   . Scoliosis   . Severe mitral regurgitation 05/2013   s/p MV repair with annuloplasty    ROS:   All systems reviewed  and negative except as noted in the HPI.   Past Surgical History:  Procedure Laterality Date  . ABDOMINAL HYSTERECTOMY    . APPENDECTOMY    . CARDIAC CATHETERIZATION    . CARDIAC DEFIBRILLATOR PLACEMENT  2010   Vfib arrest, single chamber ICD  . CARPAL TUNNEL RELEASE Right 03/05/2014   Procedure: RIGHT CARPAL TUNNEL RELEASE;  Surgeon: Schuyler Amor, MD;  Location: [place];  Service: Orthopedics;  Laterality: Right;  . COLONOSCOPY    . HERNIA REPAIR  as child   x 2  . INTRAOPERATIVE TRANSESOPHAGEAL ECHOCARDIOGRAM N/A 05/24/2013   Procedure: INTRAOPERATIVE TRANSESOPHAGEAL ECHOCARDIOGRAM;  Surgeon: Ivin Poot, MD;  Location: [place];  Service: Open Heart Surgery;  Laterality: N/A;    . LEFT HEART CATHETERIZATION WITH CORONARY ANGIOGRAM N/A 05/16/2013   Procedure: LEFT HEART CATHETERIZATION WITH CORONARY ANGIOGRAM;  Surgeon: Clent Demark, MD;  Location: [place] CATH LAB;  Service: Cardiovascular;  Laterality: N/A;  . MITRAL VALVE REPAIR N/A 05/24/2013   Surgeon: Ivin Poot, MD; Service: Open Heart Surgery  . MULTIPLE EXTRACTIONS WITH ALVEOLOPLASTY N/A 05/21/2013   Procedure: Extraction of tooth #'s 2,4,18 with alveoloplasty and gross debridement of remaining teeth;  Surgeon: Lenn Cal, DDS;  Location: [place];  Service: Oral Surgery;  Laterality: N/A;  . OPEN REDUCTION INTERNAL FIXATION (ORIF) DISTAL RADIAL FRACTURE Right 03/05/2014   Procedure: OPEN REDUCTION INTERNAL FIXATION (ORIF) RIGHT DISTAL RADIUS FRACTURE ;  Surgeon: Schuyler Amor, MD;  Location: [place];  Service: Orthopedics;  Laterality: Right;  . TEE WITHOUT CARDIOVERSION  09/24/2012   Procedure: TRANSESOPHAGEAL ECHOCARDIOGRAM (TEE);  Surgeon: Fay Records, MD;  Location: [facility] ENDOSCOPY;  Service: Cardiovascular;  Laterality: N/A;     Family History  Problem Relation Age of Onset  . Cancer - Other Mother   . CAD Mother   . Arthritis Mother   . CVA Sister   . CAD Sister   . Seizures Brother   . Heart disease Father      Social History   Socioeconomic History  . Marital status: Widowed    Spouse name: Not on file  . Number of children: 1  . Years of education: 90  . Highest education level: Not on file  Occupational History  . Occupation: Retired  Tobacco Use  . Smoking status: Never Smoker  . Smokeless tobacco: Never Used  Vaping Use  . Vaping Use: Never used  Substance and Sexual Activity  . Alcohol use: No  . Drug use: No  . Sexual activity: Yes    Birth control/protection: Surgical  Other Topics Concern  . Not on file  Social History Narrative   Fun: Shop, read, dance   Denies religious beliefs effecting health care.    Social Determinants of Health   Financial Resource  Strain:   . Difficulty of Paying Living Expenses:   Food Insecurity:   . Worried About Charity fundraiser in the Last Year:   . Arboriculturist in the Last Year:   Transportation Needs:   . Film/video editor (Medical):   Marland Kitchen Lack of Transportation (Non-Medical):   Physical Activity:   . Days of Exercise per Week:   . Minutes of Exercise per Session:   Stress:   . Feeling of Stress :   Social Connections:   . Frequency of Communication with Friends and Family:   . Frequency of Social Gatherings with Friends and Family:   . Attends Religious Services:   .  Active Member of Clubs or Organizations:   . Attends Archivist Meetings:   Marland Kitchen Marital Status:   Intimate Partner Violence:   . Fear of Current or Ex-Partner:   . Emotionally Abused:   Marland Kitchen Physically Abused:   . Sexually Abused:      BP 134/74   Pulse (!) 52   Ht 5\' 4"  (1.626 m)   Wt 171 lb (77.6 kg)   BMI 29.35 kg/m   Physical Exam:  Well appearing NAD HEENT: Unremarkable Neck:  No JVD, no thyromegally Lymphatics:  No adenopathy Back:  No CVA tenderness Lungs:  Clear with no wheezes HEART:  IRegular rate rhythm, no murmurs, no rubs, no clicks Abd:  soft, positive bowel sounds, no organomegally, no rebound, no guarding Ext:  2 plus pulses, no edema, no cyanosis, no clubbing Skin:  No rashes no nodules Neuro:  CN II through XII intact, motor grossly intact  EKG - irrigular bradycardia, atrial fib vs sinus brady.  DEVICE  Normal device function.  See PaceArt for details.   Assess/Plan: 1. Abnormal ECG - I cannot tell if she is having atrial fib. I have recommended she wear a Zio monitor. If atrial fib is seen, she will need to be on systemic anti-coag. 2. VF arrest - she has had no recurrent arrhythmias on low dose amiodarone. 3. ICD - her [place] ICD is working normally. She has 2 years on the battery 4. Chronic systolic heart failure - her symptoms are class 2. She will continue her current  meds.  Salome Spotted.

## 2020-05-20 NOTE — Patient Instructions (Addendum)
Medication Instructions:  Your physician has recommended you make the following change in your medication:   1.  REDUCE your Toprol XL ---You will take 25 mg one tablet by mouth daily.  You may split your current medication (Toprol XL 50 mg) in half and take a half tablet daily until that is gone.  Then your new prescription will be for 25 mg tablets.  Labwork: None ordered.  Testing/Procedures: Your physician has recommended that you wear a holter monitor. Holter monitors are medical devices that record the heart's electrical activity. Doctors most often use these monitors to diagnose arrhythmias. Arrhythmias are problems with the speed or rhythm of the heartbeat. The monitor is a small, portable device. You can wear one while you do your normal daily activities. This is usually used to diagnose what is causing palpitations/syncope (passing out).  You are going to wear a monitor for 3 days  Follow-Up:  Next device check:  August 27, 2020 at 2:00 pm at the [facility] office  Your physician wants you to follow-up in: EVERY 3 months with the device clinic for a device check.  You will receive a reminder letter in the mail two months in advance. If you don't receive a letter, please call our office to schedule the follow-up appointment.  Your physician wants you to follow-up in: one year with Dr. Lovena Le (this may be sooner based on results of your monitor)  You will receive a reminder letter in the mail two months in advance. If you don't receive a letter, please call our office to schedule the follow-up appointment.  Any Other Special Instructions Will Be Listed Below (If Applicable).  If you need a refill on your cardiac medications before your next appointment, please call your pharmacy.   ZIO XT- Long Term Monitor Instructions   Your physician has requested you wear your ZIO patch monitor_3_days.   This is a single patch monitor.  [employer] supplies one patch monitor per enrollment.   Additional stickers are not available.   Please do not apply patch if you will be having a Nuclear Stress Test, Echocardiogram, Cardiac CT, MRI, or Chest Xray during the time frame you would be wearing the monitor. The patch cannot be worn during these tests.  You cannot remove and re-apply the ZIO XT patch monitor.   Your ZIO patch monitor will be sent USPS Priority mail from [facility] directly to your home address. The monitor may also be mailed to a PO BOX if home delivery is not available.   It may take 3-5 days to receive your monitor after you have been enrolled.   Once you have received you monitor, please review enclosed instructions.  Your monitor has already been registered assigning a specific monitor serial # to you.   Applying the monitor   Shave hair from upper left chest.   Hold abrader disc by orange tab.  Rub abrader in 40 strokes over left upper chest as indicated in your monitor instructions.   Clean area with 4 enclosed alcohol pads .  Use all pads to assure are is cleaned thoroughly.  Let dry.   Apply patch as indicated in monitor instructions.  Patch will be place under collarbone on left side of chest with arrow pointing upward.   Rub patch adhesive wings for 2 minutes.Remove white label marked "1".  Remove white label marked "2".  Rub patch adhesive wings for 2 additional minutes.   While looking in a mirror, press and release button  in center of patch.  A small green light will flash 3-4 times .  This will be your only indicator the monitor has been turned on.     Do not shower for the first 24 hours.  You may shower after the first 24 hours.   Press button if you feel a symptom. You will hear a small click.  Record Date, Time and Symptom in the Patient Log Book.   When you are ready to remove patch, follow instructions on last 2 pages of Patient Log Book.  Stick patch monitor onto last page of Patient Log Book.   Place Patient Log Book in [place] box.   Use locking tab on box and tape box closed securely.  The [employer] has [employer] on it.  Please place in mailbox as soon as possible.  Your physician should have your test results approximately 7 days after the monitor has been mailed back to [facility]   Call [place] at [phone] if you have questions regarding your ZIO XT patch monitor.  Call them immediately if you see an orange light blinking on your monitor.   If your monitor falls off in less than 4 days contact our Monitor department at [phone].  If your monitor becomes loose or falls off after 4 days call [employer] at [phone] for suggestions on securing your monitor.

## 2020-05-20 NOTE — Telephone Encounter (Signed)
Enrolled patient for a 3 day Zio monitor to be mailed to patients home.  

## 2020-05-24 ENCOUNTER — Other Ambulatory Visit (INDEPENDENT_AMBULATORY_CARE_PROVIDER_SITE_OTHER): Payer: Medicare HMO

## 2020-05-24 DIAGNOSIS — Z95810 Presence of automatic (implantable) cardiac defibrillator: Secondary | ICD-10-CM

## 2020-05-24 DIAGNOSIS — I4901 Ventricular fibrillation: Secondary | ICD-10-CM

## 2020-05-24 DIAGNOSIS — I4891 Unspecified atrial fibrillation: Secondary | ICD-10-CM | POA: Diagnosis not present

## 2020-05-24 DIAGNOSIS — I5022 Chronic systolic (congestive) heart failure: Secondary | ICD-10-CM | POA: Diagnosis not present

## 2020-05-26 ENCOUNTER — Telehealth: Payer: Self-pay

## 2020-05-26 NOTE — Telephone Encounter (Signed)
noted 

## 2020-05-26 NOTE — Telephone Encounter (Signed)
Access nurse note faxed to office and was not in access nurse portal; pt has prod cough with yellow phlegm, some chest congestion and wheezing; pt denies fever.per Avie Echevaria NP pt will need to go to [facility] for eval of respiratory symptoms and [place] at front desk rescheduled new pt appt for 07/16/20 at 2:15. Sending access note for scanning.

## 2020-05-27 ENCOUNTER — Ambulatory Visit: Payer: Medicare HMO | Admitting: Internal Medicine

## 2020-06-08 DIAGNOSIS — I4891 Unspecified atrial fibrillation: Secondary | ICD-10-CM | POA: Diagnosis not present

## 2020-06-08 DIAGNOSIS — I4901 Ventricular fibrillation: Secondary | ICD-10-CM | POA: Diagnosis not present

## 2020-07-07 DIAGNOSIS — H524 Presbyopia: Secondary | ICD-10-CM | POA: Diagnosis not present

## 2020-07-07 DIAGNOSIS — H2513 Age-related nuclear cataract, bilateral: Secondary | ICD-10-CM | POA: Diagnosis not present

## 2020-07-07 DIAGNOSIS — H25013 Cortical age-related cataract, bilateral: Secondary | ICD-10-CM | POA: Diagnosis not present

## 2020-07-13 DIAGNOSIS — I480 Paroxysmal atrial fibrillation: Secondary | ICD-10-CM | POA: Diagnosis not present

## 2020-07-13 DIAGNOSIS — I255 Ischemic cardiomyopathy: Secondary | ICD-10-CM | POA: Diagnosis not present

## 2020-07-13 DIAGNOSIS — E785 Hyperlipidemia, unspecified: Secondary | ICD-10-CM | POA: Diagnosis not present

## 2020-07-13 DIAGNOSIS — I10 Essential (primary) hypertension: Secondary | ICD-10-CM | POA: Diagnosis not present

## 2020-07-13 DIAGNOSIS — I5020 Unspecified systolic (congestive) heart failure: Secondary | ICD-10-CM | POA: Diagnosis not present

## 2020-07-16 ENCOUNTER — Ambulatory Visit: Payer: Medicare HMO | Admitting: Internal Medicine

## 2020-07-16 ENCOUNTER — Telehealth: Payer: Self-pay

## 2020-07-16 DIAGNOSIS — Z0289 Encounter for other administrative examinations: Secondary | ICD-10-CM

## 2020-07-16 NOTE — Telephone Encounter (Signed)
[place] Night - Client Nonclinical Telephone Record AccessNurse Client [facility] Night - Client Client Site [place] Physician Webb Silversmith - NP Contact Type Call Who Is Calling Patient / Member / Family / Caregiver Caller Name [place] Phone Number [phone] Patient Name [name] Patient DOB 18-May-1946 Call Type Message Only Information Provided Reason for Call Request for General Office Information Initial Comment Caller states that she has an appointment tomorrow and she is very sick and wants to know if she can do a virtual appointment. Additional Comment Caller states that her phone isn't working and to call the number given. Disp. Time Disposition Final User 07/15/2020 11:22:04 PM General Information Provided Yes Electa Sniff Call Closed By: Electa Sniff Transaction Date/Time: 07/15/2020 11:16:42 PM (ET)

## 2020-07-16 NOTE — Telephone Encounter (Signed)
I am not doing new patients virtually. She has already cancelled with me prior. She needs to reschedule her appt for today.

## 2020-07-30 DIAGNOSIS — H2513 Age-related nuclear cataract, bilateral: Secondary | ICD-10-CM | POA: Diagnosis not present

## 2020-08-20 DIAGNOSIS — H2512 Age-related nuclear cataract, left eye: Secondary | ICD-10-CM | POA: Diagnosis not present

## 2020-08-20 DIAGNOSIS — I509 Heart failure, unspecified: Secondary | ICD-10-CM | POA: Diagnosis not present

## 2020-09-01 ENCOUNTER — Encounter: Payer: Self-pay | Admitting: Ophthalmology

## 2020-09-01 ENCOUNTER — Other Ambulatory Visit
Admission: RE | Admit: 2020-09-01 | Discharge: 2020-09-01 | Disposition: A | Discharge status: Home / Self Care | Payer: Medicare HMO | Source: Ambulatory Visit | Attending: Ophthalmology | Admitting: Ophthalmology

## 2020-09-01 ENCOUNTER — Other Ambulatory Visit: Payer: Self-pay

## 2020-09-01 DIAGNOSIS — Z01812 Encounter for preprocedural laboratory examination: Secondary | ICD-10-CM | POA: Insufficient documentation

## 2020-09-01 DIAGNOSIS — Z20822 Contact with and (suspected) exposure to covid-19: Secondary | ICD-10-CM | POA: Diagnosis not present

## 2020-09-01 LAB — SARS CORONAVIRUS 2 (TAT 6-24 HRS): SARS Coronavirus 2: NEGATIVE

## 2020-09-02 MED ORDER — TETRACAINE HCL 0.5 % OP SOLN: 1.0000 [drp] | OPHTHALMIC | Status: DC | PRN

## 2020-09-02 MED ORDER — ARMC OPHTHALMIC DILATING DROPS: 1.0000 "application " | OPHTHALMIC | Status: AC

## 2020-09-02 MED ORDER — MOXIFLOXACIN HCL 0.5 % OP SOLN: 1.0000 [drp] | OPHTHALMIC | Status: DC | PRN

## 2020-09-02 MED ORDER — SODIUM CHLORIDE 0.9 % IV SOLN: INTRAVENOUS | Status: DC

## 2020-09-03 ENCOUNTER — Other Ambulatory Visit: Payer: Self-pay

## 2020-09-03 ENCOUNTER — Ambulatory Visit: Payer: Medicare HMO

## 2020-09-03 ENCOUNTER — Encounter: Payer: Self-pay | Admitting: Ophthalmology

## 2020-09-03 ENCOUNTER — Encounter
Admission: RE | Disposition: A | Discharge status: Home / Self Care | Payer: Self-pay | Source: Home / Self Care | Attending: Ophthalmology

## 2020-09-03 ENCOUNTER — Ambulatory Visit
Admission: RE | Admit: 2020-09-03 | Discharge: 2020-09-03 | Disposition: A | Discharge status: Home / Self Care | Payer: Medicare HMO | Attending: Ophthalmology | Admitting: Ophthalmology

## 2020-09-03 DIAGNOSIS — K219 Gastro-esophageal reflux disease without esophagitis: Secondary | ICD-10-CM | POA: Diagnosis not present

## 2020-09-03 DIAGNOSIS — I5022 Chronic systolic (congestive) heart failure: Secondary | ICD-10-CM | POA: Diagnosis not present

## 2020-09-03 DIAGNOSIS — Z79899 Other long term (current) drug therapy: Secondary | ICD-10-CM | POA: Diagnosis not present

## 2020-09-03 DIAGNOSIS — H2512 Age-related nuclear cataract, left eye: Secondary | ICD-10-CM | POA: Insufficient documentation

## 2020-09-03 DIAGNOSIS — E039 Hypothyroidism, unspecified: Secondary | ICD-10-CM | POA: Diagnosis not present

## 2020-09-03 DIAGNOSIS — E785 Hyperlipidemia, unspecified: Secondary | ICD-10-CM | POA: Diagnosis not present

## 2020-09-03 DIAGNOSIS — I110 Hypertensive heart disease with heart failure: Secondary | ICD-10-CM | POA: Diagnosis not present

## 2020-09-03 DIAGNOSIS — Z885 Allergy status to narcotic agent status: Secondary | ICD-10-CM | POA: Diagnosis not present

## 2020-09-03 DIAGNOSIS — Z79890 Hormone replacement therapy (postmenopausal): Secondary | ICD-10-CM | POA: Diagnosis not present

## 2020-09-03 HISTORY — DX: Irritable bowel syndrome, unspecified: K58.9

## 2020-09-03 HISTORY — DX: Unspecified chronic bronchitis: J42

## 2020-09-03 HISTORY — PX: CATARACT EXTRACTION W/PHACO: SHX586

## 2020-09-03 SURGERY — PHACOEMULSIFICATION, CATARACT, WITH IOL INSERTION
Anesthesia: Monitor Anesthesia Care | Site: Eye | Laterality: Left

## 2020-09-03 MED ORDER — POVIDONE-IODINE 5 % OP SOLN
OPHTHALMIC | Status: AC
  Filled 2020-09-03: qty 30

## 2020-09-03 MED ORDER — MIDAZOLAM HCL 2 MG/2ML IJ SOLN
INTRAMUSCULAR | Status: DC | PRN
  Administered 2020-09-03: 1 mg via INTRAVENOUS

## 2020-09-03 MED ORDER — EPINEPHRINE PF 1 MG/ML IJ SOLN
INTRAOCULAR | Status: DC | PRN
  Administered 2020-09-03: 200 mL via OPHTHALMIC

## 2020-09-03 MED ORDER — NA CHONDROIT SULF-NA HYALURON 40-17 MG/ML IO SOLN
INTRAOCULAR | Status: DC | PRN
  Administered 2020-09-03: 1 mL via INTRAOCULAR

## 2020-09-03 MED ORDER — LIDOCAINE HCL (PF) 4 % IJ SOLN
INTRAOCULAR | Status: DC | PRN
  Administered 2020-09-03: 2 mL via OPHTHALMIC

## 2020-09-03 MED ORDER — ONDANSETRON HCL 4 MG/2ML IJ SOLN: 4.0000 mg | Freq: Once | INTRAMUSCULAR | Status: DC | PRN

## 2020-09-03 MED ORDER — FENTANYL CITRATE (PF) 100 MCG/2ML IJ SOLN
INTRAMUSCULAR | Status: DC | PRN
  Administered 2020-09-03: 50 ug via INTRAVENOUS

## 2020-09-03 MED ORDER — FENTANYL CITRATE (PF) 100 MCG/2ML IJ SOLN
INTRAMUSCULAR | Status: AC
  Filled 2020-09-03: qty 2

## 2020-09-03 MED ORDER — MIDAZOLAM HCL 2 MG/2ML IJ SOLN
INTRAMUSCULAR | Status: AC
  Filled 2020-09-03: qty 2

## 2020-09-03 MED ORDER — CARBACHOL 0.01 % IO SOLN
INTRAOCULAR | Status: DC | PRN
  Administered 2020-09-03: 0.5 mL via INTRAOCULAR

## 2020-09-03 MED ORDER — EPINEPHRINE PF 1 MG/ML IJ SOLN
INTRAMUSCULAR | Status: AC
  Filled 2020-09-03: qty 2

## 2020-09-03 MED ORDER — NA CHONDROIT SULF-NA HYALURON 40-30 MG/ML IO SOSY
INTRAOCULAR | Status: AC
  Filled 2020-09-03: qty 0.5

## 2020-09-03 MED ORDER — LIDOCAINE HCL (PF) 4 % IJ SOLN
INTRAMUSCULAR | Status: AC
  Filled 2020-09-03: qty 5

## 2020-09-03 MED ORDER — MOXIFLOXACIN HCL 0.5 % OP SOLN
OPHTHALMIC | Status: AC
  Filled 2020-09-03: qty 3

## 2020-09-03 MED ORDER — ARMC OPHTHALMIC DILATING DROPS
OPHTHALMIC | Status: AC
  Administered 2020-09-03: 1 via OPHTHALMIC
  Filled 2020-09-03: qty 0.5

## 2020-09-03 MED ORDER — POVIDONE-IODINE 5 % OP SOLN
OPHTHALMIC | Status: DC | PRN
  Administered 2020-09-03: 1 via OPHTHALMIC

## 2020-09-03 MED ORDER — ONDANSETRON HCL 4 MG/2ML IJ SOLN
INTRAMUSCULAR | Status: DC | PRN
  Administered 2020-09-03: 4 mg via INTRAVENOUS

## 2020-09-03 MED ORDER — MOXIFLOXACIN HCL 0.5 % OP SOLN
OPHTHALMIC | Status: DC | PRN
  Administered 2020-09-03: 0.2 mL via OPHTHALMIC

## 2020-09-03 MED ORDER — TETRACAINE HCL 0.5 % OP SOLN
OPHTHALMIC | Status: AC
  Administered 2020-09-03: 1 [drp] via OPHTHALMIC
  Filled 2020-09-03: qty 4

## 2020-09-03 SURGICAL SUPPLY — 17 items
GLOVE BIO SURGEON STRL SZ8 (GLOVE) ×3 IMPLANT
GLOVE BIOGEL M 6.5 STRL (GLOVE) ×3 IMPLANT
GLOVE SURG LX 8.0 MICRO (GLOVE) ×2
GLOVE SURG LX STRL 8.0 MICRO (GLOVE) ×1 IMPLANT
GOWN STRL REUS W/ TWL LRG LVL3 (GOWN DISPOSABLE) ×2 IMPLANT
GOWN STRL REUS W/TWL LRG LVL3 (GOWN DISPOSABLE) ×6
LABEL CATARACT MEDS ST (LABEL) ×3 IMPLANT
LENS IOL TECNIS EYHANCE 21.5 (Intraocular Lens) ×2 IMPLANT
MANIFOLD NEPTUNE II (INSTRUMENTS) ×3 IMPLANT
PACK CATARACT (MISCELLANEOUS) ×3 IMPLANT
PACK CATARACT BRASINGTON LX (MISCELLANEOUS) ×3 IMPLANT
PACK EYE AFTER SURG (MISCELLANEOUS) ×3 IMPLANT
SOL BSS BAG (MISCELLANEOUS) ×3
SOLUTION BSS BAG (MISCELLANEOUS) ×1 IMPLANT
SYR 5ML LL (SYRINGE) ×3 IMPLANT
WATER STERILE IRR 250ML POUR (IV SOLUTION) ×3 IMPLANT
WIPE NON LINTING 3.25X3.25 (MISCELLANEOUS) ×3 IMPLANT

## 2020-09-03 NOTE — Op Note (Signed)
PREOPERATIVE DIAGNOSIS:  Nuclear sclerotic cataract of the left eye.   POSTOPERATIVE DIAGNOSIS:  Nuclear sclerotic cataract of the left eye.   OPERATIVE PROCEDURE: Procedure(s): CATARACT EXTRACTION PHACO AND INTRAOCULAR LENS PLACEMENT (IOC) LEFT   SURGEON:  Birder Robson, MD.   ANESTHESIA:  Anesthesiologist: Gunnar Fusi, MD CRNA: Doreen Salvage, CRNA Student Nurse Anesthetist: Anders Simmonds, RN  1.      Managed anesthesia care. 2.     0.16ml of Shugarcaine was instilled following the paracentesis   COMPLICATIONS:  None.   TECHNIQUE:   Stop and chop   DESCRIPTION OF PROCEDURE:  The patient was examined and consented in the preoperative holding area where the aforementioned topical anesthesia was applied to the left eye and then brought back to the Operating Room where the left eye was prepped and draped in the usual sterile ophthalmic fashion and a lid speculum was placed. A paracentesis was created with the side port blade and the anterior chamber was filled with viscoelastic. A near clear corneal incision was performed with the steel keratome. A continuous curvilinear capsulorrhexis was performed with a cystotome followed by the capsulorrhexis forceps. Hydrodissection and hydrodelineation were carried out with BSS on a blunt cannula. The lens was removed in a stop and chop  technique and the remaining cortical material was removed with the irrigation-aspiration handpiece. The capsular bag was inflated with viscoelastic and the Technis [id number] lens was placed in the capsular bag without complication. The remaining viscoelastic was removed from the eye with the irrigation-aspiration handpiece. The wounds were hydrated. The anterior chamber was flushed with Miostat and the eye was inflated to physiologic pressure. 0.69ml Vigamox was placed in the anterior chamber. The wounds were found to be water tight. The eye was dressed with Vigamox. The patient was given protective glasses to wear  throughout the day and a shield with which to sleep tonight. The patient was also given drops with which to begin a drop regimen today and will follow-up with me in one day. Implant Name Type Inv. Item Serial No. Manufacturer Lot No. LRB No. Used Action  LENS IOL TECNIS EYHANCE 21.5 - [id number] Intraocular Lens LENS IOL TECNIS EYHANCE 21.5 [id number] JOHNSON   Left 1 Implanted    Procedure(s) with comments: CATARACT EXTRACTION PHACO AND INTRAOCULAR LENS PLACEMENT ([place]) LEFT (Left) - Lot # [id number] H [place]: 01:04.1 CDE:11:62   Electronically signed: Birder Robson 09/03/2020 8:34 AM

## 2020-09-03 NOTE — Anesthesia Procedure Notes (Signed)
Procedure Name: MAC Date/Time: 09/03/2020 8:10 AM Performed by: Doreen Salvage, CRNA Pre-anesthesia Checklist: Patient identified, Emergency Drugs available, Suction available and Patient being monitored Patient Re-evaluated:Patient Re-evaluated prior to induction Oxygen Delivery Method: Nasal cannula

## 2020-09-03 NOTE — H&P (Signed)
[facility]   Primary Care Physician:  Jearld Fenton, NP Ophthalmologist: Dr. George Ina  Pre-Procedure History & Physical: HPI:  [name] is a 74 y.o. female here for cataract surgery.   Past Medical History:  Diagnosis Date  . Anxiety   . Arthritis   . Automatic implantable cardioverter-defibrillator in situ   . CHF (congestive heart failure) ([place]) 05/2013   class III, severe  . Chronic bronchitis ([place])   . Depression   . GERD (gastroesophageal reflux disease)   . Headache(784.0)    migraines  . Heart murmur   . History of sudden cardiac arrest successfully resuscitated 2011  . HLD (hyperlipidemia)   . Hypertension   . Hypothyroidism   . IBS (irritable bowel syndrome)   . Paroxysmal atrial fibrillation (HCC)   . Pneumonia   . Scoliosis   . Severe mitral regurgitation 05/2013   s/p MV repair with annuloplasty    Past Surgical History:  Procedure Laterality Date  . ABDOMINAL HYSTERECTOMY    . APPENDECTOMY    . CARDIAC CATHETERIZATION    . CARDIAC DEFIBRILLATOR PLACEMENT  2010   Vfib arrest, single chamber ICD  . CARPAL TUNNEL RELEASE Right 03/05/2014   Procedure: RIGHT CARPAL TUNNEL RELEASE;  Surgeon: Schuyler Amor, MD;  Location: [place];  Service: Orthopedics;  Laterality: Right;  . COLONOSCOPY    . FRACTURE SURGERY    . HERNIA REPAIR  as child   x 2  . INTRAOPERATIVE TRANSESOPHAGEAL ECHOCARDIOGRAM N/A 05/24/2013   Procedure: INTRAOPERATIVE TRANSESOPHAGEAL ECHOCARDIOGRAM;  Surgeon: Ivin Poot, MD;  Location: [place];  Service: Open Heart Surgery;  Laterality: N/A;  . LEFT HEART CATHETERIZATION WITH CORONARY ANGIOGRAM N/A 05/16/2013   Procedure: LEFT HEART CATHETERIZATION WITH CORONARY ANGIOGRAM;  Surgeon: Clent Demark, MD;  Location: [place] CATH LAB;  Service: Cardiovascular;  Laterality: N/A;  . MITRAL VALVE REPAIR N/A 05/24/2013   Surgeon: Ivin Poot, MD; Service: Open Heart Surgery  . MULTIPLE EXTRACTIONS WITH ALVEOLOPLASTY N/A 05/21/2013    Procedure: Extraction of tooth #'s 2,4,18 with alveoloplasty and gross debridement of remaining teeth;  Surgeon: Lenn Cal, DDS;  Location: [place];  Service: Oral Surgery;  Laterality: N/A;  . OPEN REDUCTION INTERNAL FIXATION (ORIF) DISTAL RADIAL FRACTURE Right 03/05/2014   Procedure: OPEN REDUCTION INTERNAL FIXATION (ORIF) RIGHT DISTAL RADIUS FRACTURE ;  Surgeon: Schuyler Amor, MD;  Location: [place];  Service: Orthopedics;  Laterality: Right;  . TEE WITHOUT CARDIOVERSION  09/24/2012   Procedure: TRANSESOPHAGEAL ECHOCARDIOGRAM (TEE);  Surgeon: Fay Records, MD;  Location: [facility] ENDOSCOPY;  Service: Cardiovascular;  Laterality: N/A;    Prior to Admission medications   Medication Sig Start Date End Date Taking? Authorizing Provider  acetaminophen (TYLENOL) 500 MG tablet Take 1,000 mg by mouth every 6 (six) hours as needed. For pain   Yes [provider]  amiodarone (PACERONE) 200 MG tablet Take amiodarone 200 mg one tablet daily Monday through Saturday.  Do not take amiodarone on Sunday. Patient taking differently: Take amiodarone 200 mg one tablet daily Monday through Friday. 04/24/17  Yes Evans Lance, MD  aspirin-acetaminophen-caffeine (EXCEDRIN MIGRAINE) [phone] MG per tablet Take 1 tablet by mouth every 6 (six) hours as needed for headache.   Yes [provider]  ENTRESTO 49-51 MG Take 49-51 tablets by mouth in the morning and at bedtime. 04/22/20  Yes [provider]  hydrOXYzine (ATARAX/VISTARIL) 10 MG tablet Take 1 tablet by mouth daily. For allergies 11/04/15  Yes [provider]  levothyroxine (SYNTHROID) 100 MCG tablet Take 100 mcg by mouth daily. 02/24/20  Yes [provider]  Metoprolol Succinate 50 MG CS24 Take 1 tablet by mouth daily.   Yes [provider]  PARoxetine (PAXIL) 30 MG tablet Take 60 mg by mouth daily.    Yes [provider]  Polyvinyl Alcohol-Povidone (MURINE TEARS FOR DRY EYES OP) Place 2 drops into  both eyes 2 (two) times daily as needed. For dry eyes   Yes [provider]  pravastatin (PRAVACHOL) 40 MG tablet TAKE 1 TABLET BY MOUTH EVERY EVENING 05/08/20  Yes Evans Lance, MD    Allergies as of 07/31/2020 - Review Complete 05/20/2020  Allergen Reaction Noted  . Morphine and related Itching and Nausea And Vomiting 03/05/2014  . Codeine Nausea And Vomiting   . Demerol [meperidine hcl] Nausea And Vomiting 10/31/2018  . Meperidine hcl Nausea And Vomiting   . Pollen extract Itching and Other (See Comments) 04/06/2016    Family History  Problem Relation Age of Onset  . Cancer - Other Mother   . CAD Mother   . Arthritis Mother   . CVA Sister   . CAD Sister   . Seizures Brother   . Heart disease Father     Social History   Socioeconomic History  . Marital status: Widowed    Spouse name: Not on file  . Number of children: 1  . Years of education: 35  . Highest education level: Not on file  Occupational History  . Occupation: Retired  Tobacco Use  . Smoking status: Never Smoker  . Smokeless tobacco: Never Used  Vaping Use  . Vaping Use: Never used  Substance and Sexual Activity  . Alcohol use: No  . Drug use: No  . Sexual activity: Yes    Birth control/protection: Surgical  Other Topics Concern  . Not on file  Social History Narrative   Fun: Shop, read, dance   Denies religious beliefs effecting health care.    Social Determinants of Health   Financial Resource Strain:   . Difficulty of Paying Living Expenses: Not on file  Food Insecurity:   . Worried About Charity fundraiser in the Last Year: Not on file  . Ran Out of Food in the Last Year: Not on file  Transportation Needs:   . Lack of Transportation (Medical): Not on file  . Lack of Transportation (Non-Medical): Not on file  Physical Activity:   . Days of Exercise per Week: Not on file  . Minutes of Exercise per Session: Not on file  Stress:   . Feeling of Stress : Not on file  Social  Connections:   . Frequency of Communication with Friends and Family: Not on file  . Frequency of Social Gatherings with Friends and Family: Not on file  . Attends Religious Services: Not on file  . Active Member of Clubs or Organizations: Not on file  . Attends Archivist Meetings: Not on file  . Marital Status: Not on file  Intimate Partner Violence:   . Fear of Current or Ex-Partner: Not on file  . Emotionally Abused: Not on file  . Physically Abused: Not on file  . Sexually Abused: Not on file    Review of Systems: See HPI, otherwise negative ROS  Physical Exam: BP (!) 183/72   Pulse (!) 58   Temp 99.1 F (37.3 C) (Temporal)   Resp 14   Ht 5\' 4"  (1.626 m)  Wt 76.7 kg   BMI 29.02 kg/m  General:   Alert,  pleasant and cooperative in NAD Head:  Normocephalic and atraumatic. Respiratory:  Normal work of breathing. Heart:  Regular rate and rhythm.   Impression/Plan: [name] is here for cataract surgery.  Risks, benefits, limitations, and alternatives regarding cataract surgery have been reviewed with the patient.  Questions have been answered.  All parties agreeable.   Birder Robson, MD  09/03/2020, 7:59 AM

## 2020-09-03 NOTE — Anesthesia Preprocedure Evaluation (Signed)
Anesthesia Evaluation  Patient identified by MRN, date of birth, ID band Patient awake    Reviewed: Allergy & Precautions, H&P , NPO status , Patient's Chart, lab work & pertinent test results  History of Anesthesia Complications Negative for: history of anesthetic complications  Airway Mallampati: III  TM Distance: >3 FB Neck ROM: Full    Dental  (+) Teeth Intact, Dental Advisory Given   Pulmonary neg pulmonary ROS, neg sleep apnea, neg COPD, Patient abstained from smoking.Not current smoker,    Pulmonary exam normal breath sounds clear to auscultation       Cardiovascular Exercise Tolerance: Good METShypertension, On Medications +CHF  (-) CAD, (-) Past MI and (-) Cardiac Stents + dysrhythmias Atrial Fibrillation + Cardiac Defibrillator (-) Valvular Problems/Murmurs Rhythm:Regular Rate:Bradycardia     Neuro/Psych  Headaches, PSYCHIATRIC DISORDERS Anxiety Depression Baseline mild head tremor  Neuromuscular disease    GI/Hepatic Neg liver ROS, GERD  Medicated and Controlled,  Endo/Other  neg diabetesHypothyroidism   Renal/GU negative Renal ROS  negative genitourinary   Musculoskeletal   Abdominal   Peds  Hematology negative hematology ROS (+)   Anesthesia Other Findings Past Medical History: No date: Anxiety No date: Arthritis No date: Automatic implantable cardioverter-defibrillator in situ 05/2013: CHF (congestive heart failure) (HCC)     Comment:  class III, severe No date: Chronic bronchitis (HCC) No date: Depression No date: GERD (gastroesophageal reflux disease) No date: Headache(784.0)     Comment:  migraines No date: Heart murmur 2011: History of sudden cardiac arrest successfully resuscitated No date: HLD (hyperlipidemia) No date: Hypertension No date: Hypothyroidism No date: IBS (irritable bowel syndrome) No date: Paroxysmal atrial fibrillation (HCC) No date: Pneumonia No date:  Scoliosis 05/2013: Severe mitral regurgitation     Comment:  s/p MV repair with annuloplasty  Reproductive/Obstetrics negative OB ROS                             Anesthesia Physical  Anesthesia Plan  ASA: III  Anesthesia Plan: MAC   Post-op Pain Management:    Induction: Intravenous  PONV Risk Score and Plan: 2 and Midazolam and Ondansetron  Airway Management Planned: Natural Airway and Oral ETT  Additional Equipment: None  Intra-op Plan:   Post-operative Plan: Extubation in OR  Informed Consent: I have reviewed the patients History and Physical, chart, labs and discussed the procedure including the risks, benefits and alternatives for the proposed anesthesia with the patient or authorized representative who has indicated his/her understanding and acceptance.     Dental advisory given  Plan Discussed with: CRNA  Anesthesia Plan Comments: (Discussed risks of anesthesia with patient, including PONV, and given her head tremor, the possibility that it will not resolve with our mild sedation, and the need for general anesthesia and intubation to ensure adequate operating conditions. I explained the associated risks including sore throat, lip/dental damage. Rare risks discussed as well, such as cardiorespiratory and neurological sequelae. Patient understands.)        Anesthesia Quick Evaluation

## 2020-09-03 NOTE — Addendum Note (Signed)
Addendum  created 09/03/20 0843 by Doreen Salvage, CRNA   Intraprocedure Event edited

## 2020-09-03 NOTE — Transfer of Care (Signed)
Immediate Anesthesia Transfer of Care Note  Patient: [name]  Procedure(s) Performed: Procedure(s) with comments: CATARACT EXTRACTION PHACO AND INTRAOCULAR LENS PLACEMENT (IOC) LEFT (Left) - Lot # [id number] H [place]: 01:04.1 CDE:11:62   Patient Location: PHASE II  Anesthesia Type:MAC  Level of Consciousness: Awake, Alert, Oriented  Airway & Oxygen Therapy: Patient Spontanous Breathing and Patient on room air   Post-op Assessment: Report given to RN and Post -op Vital signs reviewed and stable  Post vital signs: Reviewed and stable  Last Vitals:  Vitals:   09/03/20 0707 09/03/20 0834  BP: (!) 183/72 (!) 171/56  Pulse: (!) 58 (!) 51  Resp: 14 16  Temp: 37.3 C 36.8 C  SpO2:  75%    Complications: No apparent anesthesia complications

## 2020-09-03 NOTE — Anesthesia Postprocedure Evaluation (Signed)
Anesthesia Post Note  Patient: [name]  Procedure(s) Performed: CATARACT EXTRACTION PHACO AND INTRAOCULAR LENS PLACEMENT (IOC) LEFT (Left Eye)  Patient location during evaluation: Phase II Anesthesia Type: MAC Level of consciousness: awake and alert Pain management: pain level controlled Vital Signs Assessment: post-procedure vital signs reviewed and stable Respiratory status: spontaneous breathing, nonlabored ventilation and respiratory function stable Cardiovascular status: stable and blood pressure returned to baseline Postop Assessment: no apparent nausea or vomiting Anesthetic complications: no   No complications documented.   Last Vitals:  Vitals:   09/03/20 0707 09/03/20 0834  BP: (!) 183/72 (!) 171/56  Pulse: (!) 58 (!) 51  Resp: 14 16  Temp: 37.3 C 36.8 C  SpO2:  94%    Last Pain:  Vitals:   09/03/20 0834  TempSrc: Temporal  PainSc: 0-No pain                 Alison Stalling

## 2020-09-03 NOTE — Discharge Instructions (Addendum)
Eye Surgery Discharge Instructions  Expect mild scratchy sensation or mild soreness. DO NOT RUB YOUR EYE!  The day of surgery: . Minimal physical activity, but bed rest is not required . No reading, computer work, or close hand work . No bending, lifting, or straining. . May watch TV  For 24 hours: . No driving, legal decisions, or alcoholic beverages . Safety precautions . Eat anything you prefer: It is better to start with liquids, then soup then solid foods. . Solar shield eyeglasses should be worn for comfort in the sunlight/patch while sleeping  Resume all regular medications including aspirin or Coumadin if these were discontinued prior to surgery. You may shower, bathe, shave, or wash your hair. Tylenol may be taken for mild discomfort. FOLLOW DR. PORFILIO'S EYE DROP INSTRUCTION SHEET AS REVIEWED.  Call your doctor if you experience significant pain, nausea, or vomiting, fever > 101 or other signs of infection. [phone] or [phone] Specific instructions:   Follow-up Information    Birder Robson, MD Follow up.   Specialty: Ophthalmology Why: as scheduled Contact information: [address] [place] [address] [phone]

## 2020-09-04 ENCOUNTER — Encounter: Payer: Self-pay | Admitting: Ophthalmology

## 2020-09-08 ENCOUNTER — Ambulatory Visit (INDEPENDENT_AMBULATORY_CARE_PROVIDER_SITE_OTHER): Payer: Medicare HMO | Admitting: Emergency Medicine

## 2020-09-08 ENCOUNTER — Other Ambulatory Visit: Payer: Self-pay

## 2020-09-08 DIAGNOSIS — I4901 Ventricular fibrillation: Secondary | ICD-10-CM | POA: Diagnosis not present

## 2020-09-08 NOTE — Progress Notes (Signed)
ICD check in clinic. Normal device function. Thresholds and sensing consistent with previous device measurements. Impedance trends stable over time. No ventricular arrhythmias. Histogram distribution appropriate for patient and level of activity. No changes made this session. Device programmed at appropriate safety margins. Device programmed to optimize intrinsic conduction. Estimated longevity 1 year 7 months. Pt does in-clinic checks only, next check 12/10/20. Patient education completed including shock plan. Auditory/vibratory alert demonstrated.

## 2020-09-09 LAB — CUP PACEART INCLINIC DEVICE CHECK
Battery Remaining Longevity: 19 mo
Brady Statistic RV Percent Paced: 0.06 %
Date Time Interrogation Session: 20211123141343
HighPow Impedance: 38.875
Implantable Lead Implant Date: 20101124
Implantable Lead Location: 753860
Implantable Pulse Generator Implant Date: 20101124
Lead Channel Impedance Value: 350 Ohm
Lead Channel Pacing Threshold Amplitude: 1.25 V
Lead Channel Pacing Threshold Pulse Width: 0.4 ms
Lead Channel Sensing Intrinsic Amplitude: 5.1 mV
Lead Channel Setting Pacing Amplitude: 2.75 V
Lead Channel Setting Pacing Pulse Width: 0.4 ms
Lead Channel Setting Sensing Sensitivity: 0.5 mV
Pulse Gen Serial Number: 745406

## 2020-12-17 DIAGNOSIS — I10 Essential (primary) hypertension: Secondary | ICD-10-CM | POA: Diagnosis not present

## 2020-12-17 DIAGNOSIS — H2511 Age-related nuclear cataract, right eye: Secondary | ICD-10-CM | POA: Diagnosis not present

## 2020-12-24 ENCOUNTER — Encounter: Payer: Self-pay | Admitting: Ophthalmology

## 2020-12-29 ENCOUNTER — Other Ambulatory Visit
Admission: RE | Admit: 2020-12-29 | Discharge: 2020-12-29 | Disposition: A | Discharge status: Home / Self Care | Payer: Medicare Other | Source: Ambulatory Visit | Attending: Ophthalmology | Admitting: Ophthalmology

## 2020-12-29 ENCOUNTER — Other Ambulatory Visit: Payer: Self-pay

## 2020-12-29 DIAGNOSIS — Z20822 Contact with and (suspected) exposure to covid-19: Secondary | ICD-10-CM | POA: Diagnosis not present

## 2020-12-29 DIAGNOSIS — Z01812 Encounter for preprocedural laboratory examination: Secondary | ICD-10-CM | POA: Diagnosis not present

## 2020-12-29 LAB — SARS CORONAVIRUS 2 (TAT 6-24 HRS): SARS Coronavirus 2: NEGATIVE

## 2020-12-30 MED ORDER — TETRACAINE HCL 0.5 % OP SOLN
1.0000 [drp] | OPHTHALMIC | Status: DC | PRN
  Administered 2020-12-31: 1 [drp] via OPHTHALMIC

## 2020-12-30 MED ORDER — SODIUM CHLORIDE 0.9 % IV SOLN: INTRAVENOUS | Status: DC

## 2020-12-30 MED ORDER — ARMC OPHTHALMIC DILATING DROPS
1.0000 "application " | OPHTHALMIC | Status: AC
  Administered 2020-12-31 (×2): 1 via OPHTHALMIC

## 2020-12-30 MED ORDER — MOXIFLOXACIN HCL 0.5 % OP SOLN: 1.0000 [drp] | OPHTHALMIC | Status: DC | PRN

## 2020-12-31 ENCOUNTER — Encounter
Admission: RE | Disposition: A | Discharge status: Home / Self Care | Payer: Self-pay | Source: Home / Self Care | Attending: Ophthalmology

## 2020-12-31 ENCOUNTER — Encounter: Payer: Self-pay | Admitting: Ophthalmology

## 2020-12-31 ENCOUNTER — Ambulatory Visit
Admission: RE | Admit: 2020-12-31 | Discharge: 2020-12-31 | Disposition: A | Discharge status: Home / Self Care | Payer: Medicare Other | Attending: Ophthalmology | Admitting: Ophthalmology

## 2020-12-31 ENCOUNTER — Ambulatory Visit: Payer: Medicare Other | Admitting: Certified Registered Nurse Anesthetist

## 2020-12-31 ENCOUNTER — Other Ambulatory Visit: Payer: Self-pay

## 2020-12-31 DIAGNOSIS — Z95810 Presence of automatic (implantable) cardiac defibrillator: Secondary | ICD-10-CM | POA: Diagnosis not present

## 2020-12-31 DIAGNOSIS — I509 Heart failure, unspecified: Secondary | ICD-10-CM | POA: Diagnosis not present

## 2020-12-31 DIAGNOSIS — Z888 Allergy status to other drugs, medicaments and biological substances status: Secondary | ICD-10-CM | POA: Insufficient documentation

## 2020-12-31 DIAGNOSIS — Z8674 Personal history of sudden cardiac arrest: Secondary | ICD-10-CM | POA: Diagnosis not present

## 2020-12-31 DIAGNOSIS — Z79890 Hormone replacement therapy (postmenopausal): Secondary | ICD-10-CM | POA: Insufficient documentation

## 2020-12-31 DIAGNOSIS — I110 Hypertensive heart disease with heart failure: Secondary | ICD-10-CM | POA: Diagnosis not present

## 2020-12-31 DIAGNOSIS — H269 Unspecified cataract: Secondary | ICD-10-CM | POA: Diagnosis not present

## 2020-12-31 DIAGNOSIS — H2511 Age-related nuclear cataract, right eye: Secondary | ICD-10-CM | POA: Diagnosis not present

## 2020-12-31 DIAGNOSIS — E785 Hyperlipidemia, unspecified: Secondary | ICD-10-CM | POA: Insufficient documentation

## 2020-12-31 DIAGNOSIS — Z90710 Acquired absence of both cervix and uterus: Secondary | ICD-10-CM | POA: Diagnosis not present

## 2020-12-31 DIAGNOSIS — Z8249 Family history of ischemic heart disease and other diseases of the circulatory system: Secondary | ICD-10-CM | POA: Diagnosis not present

## 2020-12-31 DIAGNOSIS — Z7982 Long term (current) use of aspirin: Secondary | ICD-10-CM | POA: Insufficient documentation

## 2020-12-31 DIAGNOSIS — I5022 Chronic systolic (congestive) heart failure: Secondary | ICD-10-CM | POA: Diagnosis not present

## 2020-12-31 DIAGNOSIS — I480 Paroxysmal atrial fibrillation: Secondary | ICD-10-CM | POA: Insufficient documentation

## 2020-12-31 DIAGNOSIS — Z79899 Other long term (current) drug therapy: Secondary | ICD-10-CM | POA: Insufficient documentation

## 2020-12-31 DIAGNOSIS — Z885 Allergy status to narcotic agent status: Secondary | ICD-10-CM | POA: Insufficient documentation

## 2020-12-31 DIAGNOSIS — E039 Hypothyroidism, unspecified: Secondary | ICD-10-CM | POA: Diagnosis not present

## 2020-12-31 HISTORY — PX: CATARACT EXTRACTION W/PHACO: SHX586

## 2020-12-31 HISTORY — DX: Cardiomegaly: I51.7

## 2020-12-31 SURGERY — PHACOEMULSIFICATION, CATARACT, WITH IOL INSERTION
Anesthesia: Monitor Anesthesia Care | Laterality: Right

## 2020-12-31 MED ORDER — POVIDONE-IODINE 5 % OP SOLN
OPHTHALMIC | Status: DC | PRN
  Administered 2020-12-31: 1 via OPHTHALMIC

## 2020-12-31 MED ORDER — FENTANYL CITRATE (PF) 100 MCG/2ML IJ SOLN
INTRAMUSCULAR | Status: AC
  Filled 2020-12-31: qty 2

## 2020-12-31 MED ORDER — MOXIFLOXACIN HCL 0.5 % OP SOLN
OPHTHALMIC | Status: AC
  Filled 2020-12-31: qty 3

## 2020-12-31 MED ORDER — ONDANSETRON HCL 4 MG/2ML IJ SOLN: 4.0000 mg | Freq: Once | INTRAMUSCULAR | Status: DC | PRN

## 2020-12-31 MED ORDER — LIDOCAINE HCL (PF) 4 % IJ SOLN
INTRAOCULAR | Status: DC | PRN
  Administered 2020-12-31: 2 mL via OPHTHALMIC

## 2020-12-31 MED ORDER — TETRACAINE HCL 0.5 % OP SOLN
OPHTHALMIC | Status: AC
  Filled 2020-12-31: qty 4

## 2020-12-31 MED ORDER — EPINEPHRINE PF 1 MG/ML IJ SOLN
INTRAOCULAR | Status: DC | PRN
  Administered 2020-12-31: 200 mL via OPHTHALMIC

## 2020-12-31 MED ORDER — MOXIFLOXACIN HCL 0.5 % OP SOLN
OPHTHALMIC | Status: DC | PRN
  Administered 2020-12-31: 0.2 mL via OPHTHALMIC

## 2020-12-31 MED ORDER — MIDAZOLAM HCL 2 MG/2ML IJ SOLN
INTRAMUSCULAR | Status: AC
  Filled 2020-12-31: qty 2

## 2020-12-31 MED ORDER — FENTANYL CITRATE (PF) 100 MCG/2ML IJ SOLN
INTRAMUSCULAR | Status: DC | PRN
  Administered 2020-12-31: 50 ug via INTRAVENOUS

## 2020-12-31 MED ORDER — ONDANSETRON HCL 4 MG/2ML IJ SOLN
INTRAMUSCULAR | Status: DC | PRN
  Administered 2020-12-31: 4 mg via INTRAVENOUS

## 2020-12-31 MED ORDER — MIDAZOLAM HCL 2 MG/2ML IJ SOLN
INTRAMUSCULAR | Status: DC | PRN
  Administered 2020-12-31: 1 mg via INTRAVENOUS
  Administered 2020-12-31: .5 mg via INTRAVENOUS

## 2020-12-31 MED ORDER — CARBACHOL 0.01 % IO SOLN
INTRAOCULAR | Status: DC | PRN
  Administered 2020-12-31: 0.5 mL via INTRAOCULAR

## 2020-12-31 MED ORDER — ONDANSETRON HCL 4 MG/2ML IJ SOLN
INTRAMUSCULAR | Status: AC
  Filled 2020-12-31: qty 2

## 2020-12-31 MED ORDER — NA CHONDROIT SULF-NA HYALURON 40-17 MG/ML IO SOLN
INTRAOCULAR | Status: DC | PRN
  Administered 2020-12-31: 1 mL via INTRAOCULAR

## 2020-12-31 MED ORDER — ARMC OPHTHALMIC DILATING DROPS
OPHTHALMIC | Status: AC
  Administered 2020-12-31: 1 via OPHTHALMIC
  Filled 2020-12-31: qty 0.5

## 2020-12-31 SURGICAL SUPPLY — 16 items
GLOVE SURG ENC MOIS LTX SZ8 (GLOVE) ×2 IMPLANT
GLOVE SURG ENC TEXT LTX SZ6.5 (GLOVE) ×2 IMPLANT
GLOVE SURG ENC TEXT LTX SZ8 (GLOVE) ×2 IMPLANT
GOWN STRL REUS W/ TWL LRG LVL3 (GOWN DISPOSABLE) ×2 IMPLANT
GOWN STRL REUS W/TWL LRG LVL3 (GOWN DISPOSABLE) ×4
LABEL CATARACT MEDS ST (LABEL) ×2 IMPLANT
LENS IOL TECNIS EYHANCE 21.0 (Intraocular Lens) ×2 IMPLANT
MANIFOLD NEPTUNE II (INSTRUMENTS) ×2 IMPLANT
PACK CATARACT (MISCELLANEOUS) ×2 IMPLANT
PACK CATARACT BRASINGTON LX (MISCELLANEOUS) ×2 IMPLANT
PACK EYE AFTER SURG (MISCELLANEOUS) ×2 IMPLANT
SOL BSS BAG (MISCELLANEOUS) ×2
SOLUTION BSS BAG (MISCELLANEOUS) ×1 IMPLANT
SYR 5ML LL (SYRINGE) ×2 IMPLANT
WATER STERILE IRR 250ML POUR (IV SOLUTION) ×2 IMPLANT
WIPE NON LINTING 3.25X3.25 (MISCELLANEOUS) ×2 IMPLANT

## 2020-12-31 NOTE — Anesthesia Postprocedure Evaluation (Signed)
Anesthesia Post Note  Patient: [name]  Procedure(s) Performed: CATARACT EXTRACTION PHACO AND INTRAOCULAR LENS PLACEMENT (IOC) RIGHT (Right )  Patient location during evaluation: Phase II Anesthesia Type: MAC Level of consciousness: awake and alert and oriented Pain management: satisfactory to patient Vital Signs Assessment: post-procedure vital signs reviewed and stable Respiratory status: respiratory function stable Cardiovascular status: stable Anesthetic complications: no   No complications documented.   Last Vitals:  Vitals:   12/31/20 0731  BP: (!) 177/79  Pulse: 61  Resp: 17  Temp: 36.4 C  SpO2: 98%    Last Pain:  Vitals:   12/31/20 0731  TempSrc: Temporal  PainSc: 0-No pain                 Blima Singer

## 2020-12-31 NOTE — Transfer of Care (Signed)
Immediate Anesthesia Transfer of Care Note  Patient: [name]  Procedure(s) Performed: CATARACT EXTRACTION PHACO AND INTRAOCULAR LENS PLACEMENT (IOC) RIGHT (Right )  Patient Location: PACU  Anesthesia Type:MAC  Level of Consciousness: awake, alert  and oriented  Airway & Oxygen Therapy: Patient Spontanous Breathing  Post-op Assessment: Report given to RN and Post -op Vital signs reviewed and stable  Post vital signs: Reviewed and stable  Last Vitals:  Vitals Value Taken Time  BP    Temp    Pulse    Resp    SpO2      Last Pain:  Vitals:   12/31/20 0731  TempSrc: Temporal  PainSc: 0-No pain         Complications: No complications documented.

## 2020-12-31 NOTE — Discharge Instructions (Addendum)
Eye Surgery Discharge Instructions  Expect mild scratchy sensation or mild soreness. DO NOT RUB YOUR EYE!  The day of surgery: . Minimal physical activity, but bed rest is not required . No reading, computer work, or close hand work . No bending, lifting, or straining. . May watch TV  For 24 hours: . No driving, legal decisions, or alcoholic beverages . Safety precautions . Eat anything you prefer: It is better to start with liquids, then soup then solid foods. . Solar shield eyeglasses should be worn for comfort in the sunlight/patch while sleeping  Resume all regular medications including aspirin or Coumadin if these were discontinued prior to surgery. You may shower, bathe, shave, or wash your hair. Tylenol may be taken for mild discomfort. FOLLOW DR. PORFILIO'S EYE DROP INSTRUCTION SHEET AS REVIEWED.  Call your doctor if you experience significant pain, nausea, or vomiting, fever > 101 or other signs of infection. [phone] or [phone] Specific instructions:   Follow-up Information    Birder Robson, MD Follow up.   Specialty: Ophthalmology Why: 01/01/21 @ 10:50 am - [facility] OFFICE Contact information: [address] [place] [address] [phone]

## 2020-12-31 NOTE — Anesthesia Preprocedure Evaluation (Signed)
Anesthesia Evaluation  Patient identified by MRN, date of birth, ID band Patient awake    Reviewed: Allergy & Precautions, H&P , NPO status , Patient's Chart, lab work & pertinent test results  History of Anesthesia Complications Negative for: history of anesthetic complications  Airway Mallampati: III  TM Distance: >3 FB Neck ROM: Full    Dental  (+) Dental Advisory Given, Poor Dentition   Pulmonary neg pulmonary ROS, neg sleep apnea, neg COPD, Patient abstained from smoking.Not current smoker,    Pulmonary exam normal breath sounds clear to auscultation       Cardiovascular Exercise Tolerance: Good METShypertension, On Medications +CHF  (-) CAD, (-) Past MI and (-) Cardiac Stents + dysrhythmias Atrial Fibrillation + Cardiac Defibrillator (-) Valvular Problems/Murmurs Rhythm:Regular Rate:Bradycardia     Neuro/Psych  Headaches, PSYCHIATRIC DISORDERS Anxiety Depression Baseline mild head tremor  Neuromuscular disease    GI/Hepatic Neg liver ROS, GERD  Medicated and Controlled,  Endo/Other  neg diabetesHypothyroidism   Renal/GU negative Renal ROS  negative genitourinary   Musculoskeletal   Abdominal   Peds  Hematology negative hematology ROS (+)   Anesthesia Other Findings Past Medical History: No date: Anxiety No date: Arthritis No date: Automatic implantable cardioverter-defibrillator in situ 05/2013: CHF (congestive heart failure) (HCC)     Comment:  class III, severe No date: Chronic bronchitis (HCC) No date: Depression No date: GERD (gastroesophageal reflux disease) No date: Headache(784.0)     Comment:  migraines No date: Heart murmur 2011: History of sudden cardiac arrest successfully resuscitated No date: HLD (hyperlipidemia) No date: Hypertension No date: Hypothyroidism No date: IBS (irritable bowel syndrome) No date: Paroxysmal atrial fibrillation (HCC) No date: Pneumonia No date:  Scoliosis 05/2013: Severe mitral regurgitation     Comment:  s/p MV repair with annuloplasty  Reproductive/Obstetrics negative OB ROS                             Anesthesia Physical  Anesthesia Plan  ASA: III  Anesthesia Plan: MAC   Post-op Pain Management:    Induction: Intravenous  PONV Risk Score and Plan: 2 and Midazolam and Ondansetron  Airway Management Planned: Natural Airway and Oral ETT  Additional Equipment: None  Intra-op Plan:   Post-operative Plan: Extubation in OR  Informed Consent: I have reviewed the patients History and Physical, chart, labs and discussed the procedure including the risks, benefits and alternatives for the proposed anesthesia with the patient or authorized representative who has indicated his/her understanding and acceptance.     Dental advisory given  Plan Discussed with: CRNA  Anesthesia Plan Comments: (Discussed risks of anesthesia with patient, including PONV, and given her head tremor, the possibility that it will not resolve with our mild sedation, and the need for general anesthesia and intubation to ensure adequate operating conditions. I explained the associated risks including sore throat, lip/dental damage. Rare risks discussed as well, such as cardiorespiratory and neurological sequelae. Patient understands.)        Anesthesia Quick Evaluation

## 2020-12-31 NOTE — Op Note (Signed)
PREOPERATIVE DIAGNOSIS:  Nuclear sclerotic cataract of the right eye.   POSTOPERATIVE DIAGNOSIS:  H25.11 Cataract   OPERATIVE PROCEDURE: Procedure(s): CATARACT EXTRACTION PHACO AND INTRAOCULAR LENS PLACEMENT (IOC) RIGHT   SURGEON:  Birder Robson, MD.   ANESTHESIA:  Anesthesiologist: Arita Miss, MD CRNA: Demetrius Charity, CRNA  1.      Managed anesthesia care. 2.      0.26m of Shugarcaine was instilled in the eye following the paracentesis.   COMPLICATIONS:  None.   TECHNIQUE:   Stop and chop   DESCRIPTION OF PROCEDURE:  The patient was examined and consented in the preoperative holding area where the aforementioned topical anesthesia was applied to the right eye and then brought back to the Operating Room where the right eye was prepped and draped in the usual sterile ophthalmic fashion and a lid speculum was placed. A paracentesis was created with the side port blade and the anterior chamber was filled with viscoelastic. A near clear corneal incision was performed with the steel keratome. A continuous curvilinear capsulorrhexis was performed with a cystotome followed by the capsulorrhexis forceps. Hydrodissection and hydrodelineation were carried out with BSS on a blunt cannula. The lens was removed in a stop and chop  technique and the remaining cortical material was removed with the irrigation-aspiration handpiece. The capsular bag was inflated with viscoelastic and the Technis [id number]  lens was placed in the capsular bag without complication. The remaining viscoelastic was removed from the eye with the irrigation-aspiration handpiece. The wounds were hydrated. The anterior chamber was flushed with Miostat and the eye was inflated to physiologic pressure. 0.185mof Vigamox was placed in the anterior chamber. The wounds were found to be water tight. The eye was dressed with Vigamox. The patient was given protective glasses to wear throughout the day and a shield with which to sleep tonight.  The patient was also given drops with which to begin a drop regimen today and will follow-up with me in one day. Implant Name Type Inv. Item Serial No. Manufacturer Lot No. LRB No. Used Action  LENS IOL TECNIS EYHANCE 21.0 - [id number] Lens LENS IOL TECNIS EYHANCE 21.0 [id number]   Right 1 Implanted   Procedure(s) with comments: CATARACT EXTRACTION PHACO AND INTRAOCULAR LENS PLACEMENT (IOC) RIGHT (Right) - Lot #[id number] [place]00:55.8 CDE: 6.29  Electronically signed: WiBirder Robson/17/2022 9:10 AM

## 2020-12-31 NOTE — H&P (Signed)
[facility]   Primary Care Physician:  Patient, No Pcp Per Ophthalmologist: Dr. George Ina  Pre-Procedure History & Physical: HPI:  [name] is a 75 y.o. female here for cataract surgery.   Past Medical History:  Diagnosis Date  . Anxiety   . Arthritis   . Automatic implantable cardioverter-defibrillator in situ   . Cardiomegaly   . CHF (congestive heart failure) ([place]) 05/2013   class III, severe  . Chronic bronchitis ([place])   . Depression   . GERD (gastroesophageal reflux disease)   . Headache(784.0)    migraines  . Heart murmur   . History of sudden cardiac arrest successfully resuscitated 2011  . HLD (hyperlipidemia)   . Hypertension   . Hypothyroidism   . IBS (irritable bowel syndrome)   . Paroxysmal atrial fibrillation (HCC)   . Pneumonia   . Scoliosis   . Severe mitral regurgitation 05/2013   s/p MV repair with annuloplasty    Past Surgical History:  Procedure Laterality Date  . ABDOMINAL HYSTERECTOMY    . APPENDECTOMY    . CARDIAC CATHETERIZATION    . CARDIAC DEFIBRILLATOR PLACEMENT  2010   Vfib arrest, single chamber ICD  . CARPAL TUNNEL RELEASE Right 03/05/2014   Procedure: RIGHT CARPAL TUNNEL RELEASE;  Surgeon: Schuyler Amor, MD;  Location: [place];  Service: Orthopedics;  Laterality: Right;  . CATARACT EXTRACTION W/PHACO Left 09/03/2020   Procedure: CATARACT EXTRACTION PHACO AND INTRAOCULAR LENS PLACEMENT ([place]) LEFT;  Surgeon: Birder Robson, MD;  Location: [facility];  Service: Ophthalmology;  Laterality: Left;  Lot # [id number] H [place]: 01:04.1 CDE:11:62   . COLONOSCOPY    . FRACTURE SURGERY    . HERNIA REPAIR  as child   x 2  . INTRAOPERATIVE TRANSESOPHAGEAL ECHOCARDIOGRAM N/A 05/24/2013   Procedure: INTRAOPERATIVE TRANSESOPHAGEAL ECHOCARDIOGRAM;  Surgeon: Ivin Poot, MD;  Location: [place];  Service: Open Heart Surgery;  Laterality: N/A;  . LEFT HEART CATHETERIZATION WITH CORONARY ANGIOGRAM N/A 05/16/2013   Procedure: LEFT HEART CATHETERIZATION  WITH CORONARY ANGIOGRAM;  Surgeon: Clent Demark, MD;  Location: [place] CATH LAB;  Service: Cardiovascular;  Laterality: N/A;  . MITRAL VALVE REPAIR N/A 05/24/2013   Surgeon: Ivin Poot, MD; Service: Open Heart Surgery  . MULTIPLE EXTRACTIONS WITH ALVEOLOPLASTY N/A 05/21/2013   Procedure: Extraction of tooth #'s 2,4,18 with alveoloplasty and gross debridement of remaining teeth;  Surgeon: Lenn Cal, DDS;  Location: [place];  Service: Oral Surgery;  Laterality: N/A;  . OPEN REDUCTION INTERNAL FIXATION (ORIF) DISTAL RADIAL FRACTURE Right 03/05/2014   Procedure: OPEN REDUCTION INTERNAL FIXATION (ORIF) RIGHT DISTAL RADIUS FRACTURE ;  Surgeon: Schuyler Amor, MD;  Location: [place];  Service: Orthopedics;  Laterality: Right;  . TEE WITHOUT CARDIOVERSION  09/24/2012   Procedure: TRANSESOPHAGEAL ECHOCARDIOGRAM (TEE);  Surgeon: Fay Records, MD;  Location: [facility] ENDOSCOPY;  Service: Cardiovascular;  Laterality: N/A;    Prior to Admission medications   Medication Sig Start Date End Date Taking? Authorizing Provider  acetaminophen (TYLENOL) 500 MG tablet Take 1,000 mg by mouth every 6 (six) hours as needed (back/neck pain). For pain   Yes [provider]  amiodarone (PACERONE) 200 MG tablet Take amiodarone 200 mg one tablet daily Monday through Saturday.  Do not take amiodarone on Sunday. Patient taking differently: Take 200 mg by mouth every Monday, Tuesday, Wednesday, Thursday, and Friday. In the morning 04/24/17  Yes Evans Lance, MD  aspirin EC 81 MG tablet Take 81 mg by mouth every other  day. Swallow whole.   Yes [provider]  aspirin-acetaminophen-caffeine (EXCEDRIN MIGRAINE) [phone] MG per tablet Take 1 tablet by mouth every 6 (six) hours as needed for headache.   Yes [provider]  ENTRESTO 97-103 MG Take 1 tablet by mouth 2 (two) times daily. 12/04/20  Yes [provider]  Glycerin-Hypromellose-PEG 400 (VISINE DRY EYE) 0.2-0.2-1 % SOLN Place 1-2 drops  into both eyes 3 (three) times daily as needed (dry/irritated eyes).   Yes [provider]  hydrOXYzine (ATARAX/VISTARIL) 10 MG tablet Take 10 mg by mouth at bedtime. For allergies 11/04/15  Yes [provider]  levothyroxine (SYNTHROID) 100 MCG tablet Take 100 mcg by mouth daily before breakfast. 02/24/20  Yes [provider]  metoprolol succinate (TOPROL-XL) 50 MG 24 hr tablet Take 50 mg by mouth every morning. 12/02/20  Yes [provider]  PARoxetine (PAXIL) 30 MG tablet Take 60 mg by mouth every evening.   Yes [provider]  Polyvinyl Alcohol-Povidone (MURINE TEARS FOR DRY EYES OP) Place 2 drops into both eyes 2 (two) times daily as needed. For dry eyes   Yes [provider]  pravastatin (PRAVACHOL) 40 MG tablet TAKE 1 TABLET BY MOUTH EVERY EVENING Patient taking differently: Take 40 mg by mouth every evening. 05/08/20  Yes Evans Lance, MD  sodium chloride (OCEAN) 0.65 % SOLN nasal spray Place 1 spray into both nostrils as needed (dry nasal passages).   Yes [provider]    Allergies as of 10/29/2020 - Review Complete 09/08/2020  Allergen Reaction Noted  . Morphine and related Itching and Nausea And Vomiting 03/05/2014  . Codeine Nausea And Vomiting   . Demerol [meperidine hcl] Nausea And Vomiting 10/31/2018  . Meperidine hcl Nausea And Vomiting   . Pollen extract Itching and Other (See Comments) 04/06/2016    Family History  Problem Relation Age of Onset  . Cancer - Other Mother   . CAD Mother   . Arthritis Mother   . CVA Sister   . CAD Sister   . Seizures Brother   . Heart disease Father     Social History   Socioeconomic History  . Marital status: Widowed    Spouse name: Not on file  . Number of children: 1  . Years of education: 36  . Highest education level: Not on file  Occupational History  . Occupation: Retired  Tobacco Use  . Smoking status: Never Smoker  . Smokeless tobacco: Never Used  Vaping  Use  . Vaping Use: Never used  Substance and Sexual Activity  . Alcohol use: No  . Drug use: No  . Sexual activity: Yes    Birth control/protection: Surgical  Other Topics Concern  . Not on file  Social History Narrative   Fun: Shop, read, dance   Denies religious beliefs effecting health care.    Social Determinants of Health   Financial Resource Strain: Not on file  Food Insecurity: Not on file  Transportation Needs: Not on file  Physical Activity: Not on file  Stress: Not on file  Social Connections: Not on file  Intimate Partner Violence: Not on file    Review of Systems: See HPI, otherwise negative ROS  Physical Exam: BP (!) 177/79   Pulse 61   Temp 97.6 F (36.4 C) (Temporal)   Resp 17   Ht 5' 4.5" (1.638 m)   Wt 77.1 kg   SpO2 98%   BMI 28.73 kg/m  General:   Alert,  pleasant and cooperative in NAD Head:  Normocephalic and atraumatic. Respiratory:  Normal work of breathing.  Impression/Plan: Dachelle Lilja is here for cataract surgery.  Risks, benefits, limitations, and alternatives regarding cataract surgery have been reviewed with the patient.  Questions have been answered.  All parties agreeable.   Birder Robson, MD  12/31/2020, 8:37 AM

## 2020-12-31 NOTE — Anesthesia Procedure Notes (Signed)
Performed by: Demetrius Charity, CRNA Oxygen Delivery Method: Nasal cannula

## 2021-01-01 ENCOUNTER — Encounter: Payer: Self-pay | Admitting: Ophthalmology

## 2021-01-20 ENCOUNTER — Emergency Department (HOSPITAL_COMMUNITY): Payer: Medicare Other

## 2021-01-20 ENCOUNTER — Other Ambulatory Visit: Payer: Self-pay

## 2021-01-20 ENCOUNTER — Encounter (HOSPITAL_COMMUNITY): Payer: Self-pay

## 2021-01-20 ENCOUNTER — Inpatient Hospital Stay (HOSPITAL_COMMUNITY)
Admission: EM | Admit: 2021-01-20 | Discharge: 2021-01-24 | DRG: 291 | Disposition: A | Discharge status: Home / Self Care | Payer: Medicare Other | Attending: Internal Medicine | Admitting: Internal Medicine

## 2021-01-20 DIAGNOSIS — T1490XA Injury, unspecified, initial encounter: Secondary | ICD-10-CM

## 2021-01-20 DIAGNOSIS — M7989 Other specified soft tissue disorders: Secondary | ICD-10-CM | POA: Diagnosis present

## 2021-01-20 DIAGNOSIS — I50811 Acute right heart failure: Secondary | ICD-10-CM

## 2021-01-20 DIAGNOSIS — G629 Polyneuropathy, unspecified: Secondary | ICD-10-CM | POA: Diagnosis not present

## 2021-01-20 DIAGNOSIS — E538 Deficiency of other specified B group vitamins: Secondary | ICD-10-CM | POA: Diagnosis not present

## 2021-01-20 DIAGNOSIS — E785 Hyperlipidemia, unspecified: Secondary | ICD-10-CM | POA: Diagnosis not present

## 2021-01-20 DIAGNOSIS — F419 Anxiety disorder, unspecified: Secondary | ICD-10-CM | POA: Diagnosis present

## 2021-01-20 DIAGNOSIS — M1990 Unspecified osteoarthritis, unspecified site: Secondary | ICD-10-CM | POA: Diagnosis present

## 2021-01-20 DIAGNOSIS — R0602 Shortness of breath: Secondary | ICD-10-CM | POA: Diagnosis not present

## 2021-01-20 DIAGNOSIS — Z888 Allergy status to other drugs, medicaments and biological substances status: Secondary | ICD-10-CM

## 2021-01-20 DIAGNOSIS — N289 Disorder of kidney and ureter, unspecified: Secondary | ICD-10-CM | POA: Diagnosis present

## 2021-01-20 DIAGNOSIS — R000 Tachycardia, unspecified: Secondary | ICD-10-CM | POA: Diagnosis not present

## 2021-01-20 DIAGNOSIS — Z20822 Contact with and (suspected) exposure to covid-19: Secondary | ICD-10-CM | POA: Diagnosis not present

## 2021-01-20 DIAGNOSIS — E871 Hypo-osmolality and hyponatremia: Secondary | ICD-10-CM | POA: Diagnosis present

## 2021-01-20 DIAGNOSIS — Z8674 Personal history of sudden cardiac arrest: Secondary | ICD-10-CM

## 2021-01-20 DIAGNOSIS — I5023 Acute on chronic systolic (congestive) heart failure: Secondary | ICD-10-CM | POA: Diagnosis present

## 2021-01-20 DIAGNOSIS — I4901 Ventricular fibrillation: Secondary | ICD-10-CM | POA: Diagnosis not present

## 2021-01-20 DIAGNOSIS — Z79899 Other long term (current) drug therapy: Secondary | ICD-10-CM

## 2021-01-20 DIAGNOSIS — K219 Gastro-esophageal reflux disease without esophagitis: Secondary | ICD-10-CM | POA: Diagnosis present

## 2021-01-20 DIAGNOSIS — Z823 Family history of stroke: Secondary | ICD-10-CM

## 2021-01-20 DIAGNOSIS — Z4509 Encounter for adjustment and management of other cardiac device: Secondary | ICD-10-CM | POA: Diagnosis not present

## 2021-01-20 DIAGNOSIS — I480 Paroxysmal atrial fibrillation: Secondary | ICD-10-CM | POA: Diagnosis not present

## 2021-01-20 DIAGNOSIS — I5082 Biventricular heart failure: Secondary | ICD-10-CM | POA: Diagnosis present

## 2021-01-20 DIAGNOSIS — S9032XA Contusion of left foot, initial encounter: Secondary | ICD-10-CM | POA: Diagnosis not present

## 2021-01-20 DIAGNOSIS — M79606 Pain in leg, unspecified: Secondary | ICD-10-CM | POA: Diagnosis not present

## 2021-01-20 DIAGNOSIS — Z8261 Family history of arthritis: Secondary | ICD-10-CM

## 2021-01-20 DIAGNOSIS — I420 Dilated cardiomyopathy: Secondary | ICD-10-CM | POA: Diagnosis present

## 2021-01-20 DIAGNOSIS — D539 Nutritional anemia, unspecified: Secondary | ICD-10-CM

## 2021-01-20 DIAGNOSIS — W109XXA Fall (on) (from) unspecified stairs and steps, initial encounter: Secondary | ICD-10-CM | POA: Diagnosis present

## 2021-01-20 DIAGNOSIS — Z7901 Long term (current) use of anticoagulants: Secondary | ICD-10-CM

## 2021-01-20 DIAGNOSIS — G43909 Migraine, unspecified, not intractable, without status migrainosus: Secondary | ICD-10-CM | POA: Diagnosis present

## 2021-01-20 DIAGNOSIS — I509 Heart failure, unspecified: Secondary | ICD-10-CM | POA: Diagnosis not present

## 2021-01-20 DIAGNOSIS — Z952 Presence of prosthetic heart valve: Secondary | ICD-10-CM | POA: Diagnosis not present

## 2021-01-20 DIAGNOSIS — Z8249 Family history of ischemic heart disease and other diseases of the circulatory system: Secondary | ICD-10-CM

## 2021-01-20 DIAGNOSIS — E039 Hypothyroidism, unspecified: Secondary | ICD-10-CM | POA: Diagnosis present

## 2021-01-20 DIAGNOSIS — I110 Hypertensive heart disease with heart failure: Principal | ICD-10-CM | POA: Diagnosis present

## 2021-01-20 DIAGNOSIS — S99922A Unspecified injury of left foot, initial encounter: Secondary | ICD-10-CM | POA: Diagnosis not present

## 2021-01-20 DIAGNOSIS — Z23 Encounter for immunization: Secondary | ICD-10-CM

## 2021-01-20 DIAGNOSIS — Z95810 Presence of automatic (implantable) cardiac defibrillator: Secondary | ICD-10-CM | POA: Diagnosis not present

## 2021-01-20 DIAGNOSIS — I5031 Acute diastolic (congestive) heart failure: Secondary | ICD-10-CM | POA: Diagnosis not present

## 2021-01-20 DIAGNOSIS — Z79890 Hormone replacement therapy (postmenopausal): Secondary | ICD-10-CM

## 2021-01-20 DIAGNOSIS — I4891 Unspecified atrial fibrillation: Secondary | ICD-10-CM | POA: Diagnosis present

## 2021-01-20 DIAGNOSIS — R0902 Hypoxemia: Secondary | ICD-10-CM | POA: Diagnosis not present

## 2021-01-20 DIAGNOSIS — I5021 Acute systolic (congestive) heart failure: Secondary | ICD-10-CM | POA: Diagnosis not present

## 2021-01-20 DIAGNOSIS — S90112A Contusion of left great toe without damage to nail, initial encounter: Secondary | ICD-10-CM | POA: Diagnosis present

## 2021-01-20 DIAGNOSIS — J9601 Acute respiratory failure with hypoxia: Secondary | ICD-10-CM | POA: Diagnosis not present

## 2021-01-20 DIAGNOSIS — Z9109 Other allergy status, other than to drugs and biological substances: Secondary | ICD-10-CM

## 2021-01-20 DIAGNOSIS — I10 Essential (primary) hypertension: Secondary | ICD-10-CM | POA: Diagnosis not present

## 2021-01-20 DIAGNOSIS — R531 Weakness: Secondary | ICD-10-CM | POA: Diagnosis not present

## 2021-01-20 DIAGNOSIS — Z885 Allergy status to narcotic agent status: Secondary | ICD-10-CM

## 2021-01-20 DIAGNOSIS — F32A Depression, unspecified: Secondary | ICD-10-CM | POA: Diagnosis present

## 2021-01-20 DIAGNOSIS — S9002XA Contusion of left ankle, initial encounter: Secondary | ICD-10-CM | POA: Diagnosis not present

## 2021-01-20 DIAGNOSIS — S99922S Unspecified injury of left foot, sequela: Secondary | ICD-10-CM

## 2021-01-20 LAB — CBC WITH DIFFERENTIAL/PLATELET
Abs Immature Granulocytes: 0.02 10*3/uL (ref 0.00–0.07)
Basophils Absolute: 0 10*3/uL (ref 0.0–0.1)
Basophils Relative: 0 %
Eosinophils Absolute: 0 10*3/uL (ref 0.0–0.5)
Eosinophils Relative: 0 %
HCT: 31.7 % — ABNORMAL LOW (ref 36.0–46.0)
Hemoglobin: 10.4 g/dL — ABNORMAL LOW (ref 12.0–15.0)
Immature Granulocytes: 0 %
Lymphocytes Relative: 11 %
Lymphs Abs: 0.6 10*3/uL — ABNORMAL LOW (ref 0.7–4.0)
MCH: 33 pg (ref 26.0–34.0)
MCHC: 32.8 g/dL (ref 30.0–36.0)
MCV: 100.6 fL — ABNORMAL HIGH (ref 80.0–100.0)
Monocytes Absolute: 0.4 10*3/uL (ref 0.1–1.0)
Monocytes Relative: 7 %
Neutro Abs: 4.4 10*3/uL (ref 1.7–7.7)
Neutrophils Relative %: 82 %
Platelets: 284 10*3/uL (ref 150–400)
RBC: 3.15 MIL/uL — ABNORMAL LOW (ref 3.87–5.11)
RDW: 14.1 % (ref 11.5–15.5)
WBC: 5.4 10*3/uL (ref 4.0–10.5)
nRBC: 0 % (ref 0.0–0.2)

## 2021-01-20 NOTE — ED Notes (Addendum)
Pt's daughter requesting to be called with any updates. [name] [phone]

## 2021-01-20 NOTE — ED Provider Notes (Incomplete)
[place] DEPT Provider Note   CSN: [id number] Arrival date & time: 01/20/21  1916     History Chief Complaint  Patient presents with  . Shortness of Breath  . Weakness  . Fatigue    [name] is a 75 y.o. female.  HPI    75 year old female with history of CHF, V. fib arrest status post defibrillator placement comes in with chief complaint of shortness of breath, weakness and leg swelling.  Patient reports that over the last 2 weeks she has had increased leg swelling.  Leg swelling is not normal for her.  She also has been having exertional shortness of breath limiting her activity.  Normally she would be able to go to the mailbox for example, now she is afraid of stepping out of her house because of her shortness of breath and fear of not being able to make back into her house.  She lives with her grandkids.  She has baseline orthopnea and denies any worsening of it.   Patient sees Dr. Terrence Dupont for general cardiology and Dr. Lovena Le for ICD. She is on Entresto.  She denies taking any diuretics.  Past Medical History:  Diagnosis Date  . Anxiety   . Arthritis   . Automatic implantable cardioverter-defibrillator in situ   . Cardiomegaly   . CHF (congestive heart failure) ([place]) 05/2013   class III, severe  . Chronic bronchitis ([place])   . Depression   . GERD (gastroesophageal reflux disease)   . Headache(784.0)    migraines  . Heart murmur   . History of sudden cardiac arrest successfully resuscitated 2011  . HLD (hyperlipidemia)   . Hypertension   . Hypothyroidism   . IBS (irritable bowel syndrome)   . Paroxysmal atrial fibrillation (HCC)   . Pneumonia   . Scoliosis   . Severe mitral regurgitation 05/2013   s/p MV repair with annuloplasty    Patient Active Problem List   Diagnosis Date Noted  . Acute non-recurrent pansinusitis 10/03/2016  . Right shoulder pain 06/21/2016  . Bilateral knee pain 06/21/2016  . Scoliosis 02/04/2015  .  Ventricular fibrillation ([place]) 11/03/2014  . Anxiety state, unspecified 05/15/2013  . Atrial fibrillation ([place]) 05/15/2013  . Mitral regurgitation 09/20/2012  . Chronic systolic heart failure ([place]) 04/20/2010  . Automatic implantable cardioverter-defibrillator in situ 04/20/2010  . Hypothyroidism 12/28/2009  . HYPERLIPIDEMIA 12/28/2009  . Essential hypertension 12/28/2009  . CARDIAC ARRHYTHMIA 12/28/2009  . CHF 12/28/2009  . IBS 12/28/2009    Past Surgical History:  Procedure Laterality Date  . ABDOMINAL HYSTERECTOMY    . APPENDECTOMY    . CARDIAC CATHETERIZATION    . CARDIAC DEFIBRILLATOR PLACEMENT  2010   Vfib arrest, single chamber ICD  . CARPAL TUNNEL RELEASE Right 03/05/2014   Procedure: RIGHT CARPAL TUNNEL RELEASE;  Surgeon: Schuyler Amor, MD;  Location: [facility];  Service: Orthopedics;  Laterality: Right;  . CATARACT EXTRACTION W/PHACO Left 09/03/2020   Procedure: CATARACT EXTRACTION PHACO AND INTRAOCULAR LENS PLACEMENT ([place]) LEFT;  Surgeon: Birder Robson, MD;  Location: [facility];  Service: Ophthalmology;  Laterality: Left;  Lot # [id number] H [place]: 01:04.1 CDE:11:62   . CATARACT EXTRACTION W/PHACO Right 12/31/2020   Procedure: CATARACT EXTRACTION PHACO AND INTRAOCULAR LENS PLACEMENT ([place]) RIGHT;  Surgeon: Birder Robson, MD;  Location: [facility];  Service: Ophthalmology;  Laterality: Right;  Lot [id number] H [place]: 00:55.8 CDE: 6.29  . COLONOSCOPY    . FRACTURE SURGERY    . HERNIA REPAIR  as child  x 2  . INTRAOPERATIVE TRANSESOPHAGEAL ECHOCARDIOGRAM N/A 05/24/2013   Procedure: INTRAOPERATIVE TRANSESOPHAGEAL ECHOCARDIOGRAM;  Surgeon: Ivin Poot, MD;  Location: [place];  Service: Open Heart Surgery;  Laterality: N/A;  . LEFT HEART CATHETERIZATION WITH CORONARY ANGIOGRAM N/A 05/16/2013   Procedure: LEFT HEART CATHETERIZATION WITH CORONARY ANGIOGRAM;  Surgeon: Clent Demark, MD;  Location: [place] CATH [facility];  Service: Cardiovascular;  Laterality: N/A;  . MITRAL VALVE REPAIR N/A  05/24/2013   Surgeon: Ivin Poot, MD; Service: Open Heart Surgery  . MULTIPLE EXTRACTIONS WITH ALVEOLOPLASTY N/A 05/21/2013   Procedure: Extraction of tooth #'s 2,4,18 with alveoloplasty and gross debridement of remaining teeth;  Surgeon: Lenn Cal, DDS;  Location: [place];  Service: Oral Surgery;  Laterality: N/A;  . OPEN REDUCTION INTERNAL FIXATION (ORIF) DISTAL RADIAL FRACTURE Right 03/05/2014   Procedure: OPEN REDUCTION INTERNAL FIXATION (ORIF) RIGHT DISTAL RADIUS FRACTURE ;  Surgeon: Schuyler Amor, MD;  Location: [place];  Service: Orthopedics;  Laterality: Right;  . TEE WITHOUT CARDIOVERSION  09/24/2012   Procedure: TRANSESOPHAGEAL ECHOCARDIOGRAM (TEE);  Surgeon: Fay Records, MD;  Location: [facility];  Service: Cardiovascular;  Laterality: N/A;     OB History   No obstetric history on file.     Family History  Problem Relation Age of Onset  . Cancer - Other Mother   . CAD Mother   . Arthritis Mother   . CVA Sister   . CAD Sister   . Seizures Brother   . Heart disease Father     Social History   Tobacco Use  . Smoking status: Never Smoker  . Smokeless tobacco: Never Used  Vaping Use  . Vaping Use: Never used  Substance Use Topics  . Alcohol use: No  . Drug use: No    Home Medications Prior to Admission medications   Medication Sig Start Date End Date Taking? Authorizing Provider  acetaminophen (TYLENOL) 500 MG tablet Take 1,000 mg by mouth every 6 (six) hours as needed (back/neck pain). For pain    [provider]  amiodarone (PACERONE) 200 MG tablet Take amiodarone 200 mg one tablet daily Monday through Saturday.  Do not take amiodarone on Sunday. Patient taking differently: Take 200 mg by mouth every Monday, Tuesday, Wednesday, Thursday, and Friday. In the morning 04/24/17   Evans Lance, MD  aspirin EC 81 MG tablet Take 81 mg by mouth every other day. Swallow whole.    [provider]  aspirin-acetaminophen-caffeine (EXCEDRIN  MIGRAINE) [phone] MG per tablet Take 1 tablet by mouth every 6 (six) hours as needed for headache.    [provider]  ENTRESTO 97-103 MG Take 1 tablet by mouth 2 (two) times daily. 12/04/20   [provider]  Glycerin-Hypromellose-PEG 400 (VISINE DRY EYE) 0.2-0.2-1 % SOLN Place 1-2 drops into both eyes 3 (three) times daily as needed (dry/irritated eyes).    [provider]  hydrOXYzine (ATARAX/VISTARIL) 10 MG tablet Take 10 mg by mouth at bedtime. For allergies 11/04/15   [provider]  levothyroxine (SYNTHROID) 100 MCG tablet Take 100 mcg by mouth daily before breakfast. 02/24/20   [provider]  metoprolol succinate (TOPROL-XL) 50 MG 24 hr tablet Take 50 mg by mouth every morning. 12/02/20   [provider]  PARoxetine (PAXIL) 30 MG tablet Take 60 mg by mouth every evening.    [provider]  Polyvinyl Alcohol-Povidone (MURINE TEARS FOR DRY EYES OP) Place 2 drops into both eyes 2 (two) times daily  as needed. For dry eyes    [provider]  pravastatin (PRAVACHOL) 40 MG tablet TAKE 1 TABLET BY MOUTH EVERY EVENING Patient taking differently: Take 40 mg by mouth every evening. 05/08/20   Evans Lance, MD  sodium chloride (OCEAN) 0.65 % SOLN nasal spray Place 1 spray into both nostrils as needed (dry nasal passages).    [provider]    Allergies    Morphine and related, Codeine, Demerol [meperidine hcl], Meperidine hcl, and Pollen extract  Review of Systems   Review of Systems  Constitutional: Positive for activity change.  Respiratory: Positive for shortness of breath.   Gastrointestinal: Negative for nausea and vomiting.  All other systems reviewed and are negative.   Physical Exam Updated Vital Signs BP (!) 170/82   Pulse (!) 57   Temp 97.9 F (36.6 C) (Oral)   Resp (!) 25   Ht '5\' 4"'$  (1.626 m)   Wt 77.1 kg   SpO2 99%   BMI 29.18 kg/m   Physical Exam  ED Results / Procedures /  Treatments   Labs (all labs ordered are listed, but only abnormal results are displayed) Labs Reviewed  RESP PANEL BY RT-PCR (FLU A&B, COVID) ARPGX2  BASIC METABOLIC PANEL  CBC WITH DIFFERENTIAL/PLATELET  BRAIN NATRIURETIC PEPTIDE    EKG EKG Interpretation  Date/Time:  Wednesday January 20 2021 19:30:11 EDT Ventricular Rate:  59 PR Interval:    QRS Duration: 126 QT Interval:  449 QTC Calculation: 445 R Axis:   68 Text Interpretation: Junctional rhythm Nonspecific intraventricular conduction delay Repol abnrm suggests ischemia, diffuse leads No acute changes Nonspecific ST and T wave abnormality No significant change since last tracing Confirmed by Varney Biles [phone]) on 01/20/2021 9:48:20 PM   Radiology DG Chest 2 View  Result Date: 01/20/2021 CLINICAL DATA:  Weakness and shortness of breath on exertion for 2 weeks. Increased edema in the extremities. EXAM: CHEST - 2 VIEW COMPARISON:  04/06/2016 FINDINGS: Postoperative changes in the mediastinum. Cardiac pacemaker. Cardiac enlargement. No vascular congestion, edema, or consolidation is appreciated. Patient rotation does limit examination. No pleural effusions. No pneumothorax. Calcification of the aorta. IMPRESSION: Cardiac enlargement. No evidence of active pulmonary disease. Electronically Signed   By: Lucienne Capers M.D.   On: 01/20/2021 23:16    Procedures Procedures {Remember to document critical care time when appropriate:1}  Medications Ordered in ED Medications - No data to display  ED Course  I have reviewed the triage vital signs and the nursing notes.  Pertinent labs & imaging results that were available during my care of the patient were reviewed by me and considered in my medical decision making (see chart for details).    MDM Rules/Calculators/A&P                          *** Final Clinical Impression(s) / ED Diagnoses Final diagnoses:  None    Rx / DC Orders ED Discharge Orders    None

## 2021-01-20 NOTE — ED Provider Notes (Signed)
[place] DEPT Provider Note   CSN: [id number] Arrival date & time: 01/20/21  1916     History Chief Complaint  Patient presents with  . Shortness of Breath  . Weakness  . Fatigue    [name] is a 75 y.o. female.  HPI    75 year old female with history of CHF, V. fib arrest status post defibrillator placement comes in with chief complaint of shortness of breath, weakness and leg swelling.  Patient reports that over the last 2 weeks she has had increased leg swelling.  Leg swelling is not normal for her.  She also has been having exertional shortness of breath limiting her activity.  Normally she would be able to go to the mailbox for example, now she is afraid of stepping out of her house because of her shortness of breath and fear of not being able to make back into her house.  She lives with her grandkids.  She has baseline orthopnea and denies any worsening of it.   Patient sees Dr. Terrence Dupont for general cardiology and Dr. Lovena Le for ICD. She is on Entresto.  She denies taking any diuretics.  Past Medical History:  Diagnosis Date  . Anxiety   . Arthritis   . Automatic implantable cardioverter-defibrillator in situ   . Cardiomegaly   . CHF (congestive heart failure) ([place]) 05/2013   class III, severe  . Chronic bronchitis ([place])   . Depression   . GERD (gastroesophageal reflux disease)   . Headache(784.0)    migraines  . Heart murmur   . History of sudden cardiac arrest successfully resuscitated 2011  . HLD (hyperlipidemia)   . Hypertension   . Hypothyroidism   . IBS (irritable bowel syndrome)   . Paroxysmal atrial fibrillation (HCC)   . Pneumonia   . Scoliosis   . Severe mitral regurgitation 05/2013   s/p MV repair with annuloplasty    Patient Active Problem List   Diagnosis Date Noted  . Acute non-recurrent pansinusitis 10/03/2016  . Right shoulder pain 06/21/2016  . Bilateral knee pain 06/21/2016  . Scoliosis 02/04/2015  .  Ventricular fibrillation ([place]) 11/03/2014  . Anxiety state, unspecified 05/15/2013  . Atrial fibrillation ([place]) 05/15/2013  . Mitral regurgitation 09/20/2012  . Chronic systolic heart failure ([place]) 04/20/2010  . Automatic implantable cardioverter-defibrillator in situ 04/20/2010  . Hypothyroidism 12/28/2009  . HYPERLIPIDEMIA 12/28/2009  . Essential hypertension 12/28/2009  . CARDIAC ARRHYTHMIA 12/28/2009  . CHF 12/28/2009  . IBS 12/28/2009    Past Surgical History:  Procedure Laterality Date  . ABDOMINAL HYSTERECTOMY    . APPENDECTOMY    . CARDIAC CATHETERIZATION    . CARDIAC DEFIBRILLATOR PLACEMENT  2010   Vfib arrest, single chamber ICD  . CARPAL TUNNEL RELEASE Right 03/05/2014   Procedure: RIGHT CARPAL TUNNEL RELEASE;  Surgeon: Schuyler Amor, MD;  Location: [place];  Service: Orthopedics;  Laterality: Right;  . CATARACT EXTRACTION W/PHACO Left 09/03/2020   Procedure: CATARACT EXTRACTION PHACO AND INTRAOCULAR LENS PLACEMENT ([place]) LEFT;  Surgeon: Birder Robson, MD;  Location: [facility];  Service: Ophthalmology;  Laterality: Left;  Lot # [id number] H [place]: 01:04.1 CDE:11:62   . CATARACT EXTRACTION W/PHACO Right 12/31/2020   Procedure: CATARACT EXTRACTION PHACO AND INTRAOCULAR LENS PLACEMENT ([place]) RIGHT;  Surgeon: Birder Robson, MD;  Location: [facility];  Service: Ophthalmology;  Laterality: Right;  Lot [id number] H [place]: 00:55.8 CDE: 6.29  . COLONOSCOPY    . FRACTURE SURGERY    . HERNIA REPAIR  as child  x 2  . INTRAOPERATIVE TRANSESOPHAGEAL ECHOCARDIOGRAM N/A 05/24/2013   Procedure: INTRAOPERATIVE TRANSESOPHAGEAL ECHOCARDIOGRAM;  Surgeon: Ivin Poot, MD;  Location: [place];  Service: Open Heart Surgery;  Laterality: N/A;  . LEFT HEART CATHETERIZATION WITH CORONARY ANGIOGRAM N/A 05/16/2013   Procedure: LEFT HEART CATHETERIZATION WITH CORONARY ANGIOGRAM;  Surgeon: Clent Demark, MD;  Location: [place] CATH LAB;  Service: Cardiovascular;  Laterality: N/A;  . MITRAL VALVE REPAIR N/A  05/24/2013   Surgeon: Ivin Poot, MD; Service: Open Heart Surgery  . MULTIPLE EXTRACTIONS WITH ALVEOLOPLASTY N/A 05/21/2013   Procedure: Extraction of tooth #'s 2,4,18 with alveoloplasty and gross debridement of remaining teeth;  Surgeon: Lenn Cal, DDS;  Location: [place];  Service: Oral Surgery;  Laterality: N/A;  . OPEN REDUCTION INTERNAL FIXATION (ORIF) DISTAL RADIAL FRACTURE Right 03/05/2014   Procedure: OPEN REDUCTION INTERNAL FIXATION (ORIF) RIGHT DISTAL RADIUS FRACTURE ;  Surgeon: Schuyler Amor, MD;  Location: [place];  Service: Orthopedics;  Laterality: Right;  . TEE WITHOUT CARDIOVERSION  09/24/2012   Procedure: TRANSESOPHAGEAL ECHOCARDIOGRAM (TEE);  Surgeon: Fay Records, MD;  Location: [facility];  Service: Cardiovascular;  Laterality: N/A;     OB History   No obstetric history on file.     Family History  Problem Relation Age of Onset  . Cancer - Other Mother   . CAD Mother   . Arthritis Mother   . CVA Sister   . CAD Sister   . Seizures Brother   . Heart disease Father     Social History   Tobacco Use  . Smoking status: Never Smoker  . Smokeless tobacco: Never Used  Vaping Use  . Vaping Use: Never used  Substance Use Topics  . Alcohol use: No  . Drug use: No    Home Medications Prior to Admission medications   Medication Sig Start Date End Date Taking? Authorizing Provider  acetaminophen (TYLENOL) 500 MG tablet Take 1,000 mg by mouth every 6 (six) hours as needed (back/neck pain). For pain    [provider]  amiodarone (PACERONE) 200 MG tablet Take amiodarone 200 mg one tablet daily Monday through Saturday.  Do not take amiodarone on Sunday. Patient taking differently: Take 200 mg by mouth every Monday, Tuesday, Wednesday, Thursday, and Friday. In the morning 04/24/17   Evans Lance, MD  aspirin EC 81 MG tablet Take 81 mg by mouth every other day. Swallow whole.    [provider]  aspirin-acetaminophen-caffeine (EXCEDRIN  MIGRAINE) [phone] MG per tablet Take 1 tablet by mouth every 6 (six) hours as needed for headache.    [provider]  ENTRESTO 97-103 MG Take 1 tablet by mouth 2 (two) times daily. 12/04/20   [provider]  Glycerin-Hypromellose-PEG 400 (VISINE DRY EYE) 0.2-0.2-1 % SOLN Place 1-2 drops into both eyes 3 (three) times daily as needed (dry/irritated eyes).    [provider]  hydrOXYzine (ATARAX/VISTARIL) 10 MG tablet Take 10 mg by mouth at bedtime. For allergies 11/04/15   [provider]  levothyroxine (SYNTHROID) 100 MCG tablet Take 100 mcg by mouth daily before breakfast. 02/24/20   [provider]  metoprolol succinate (TOPROL-XL) 50 MG 24 hr tablet Take 50 mg by mouth every morning. 12/02/20   [provider]  PARoxetine (PAXIL) 30 MG tablet Take 60 mg by mouth every evening.    [provider]  Polyvinyl Alcohol-Povidone (MURINE TEARS FOR DRY EYES OP) Place 2 drops into both eyes 2 (two) times daily  as needed. For dry eyes    [provider]  pravastatin (PRAVACHOL) 40 MG tablet TAKE 1 TABLET BY MOUTH EVERY EVENING 05/08/20   Evans Lance, MD  sodium chloride (OCEAN) 0.65 % SOLN nasal spray Place 1 spray into both nostrils as needed (dry nasal passages).    [provider]    Allergies    Morphine and related, Codeine, Demerol [meperidine hcl], Meperidine hcl, and Pollen extract  Review of Systems   Review of Systems  Constitutional: Positive for activity change.  Respiratory: Positive for shortness of breath.   Gastrointestinal: Negative for nausea and vomiting.  All other systems reviewed and are negative.   Physical Exam Updated Vital Signs BP (!) 154/100   Pulse 61   Temp 97.9 F (36.6 C) (Oral)   Resp (!) 24   Ht '5\' 4"'$  (1.626 m)   Wt 77.1 kg   SpO2 99%   BMI 29.18 kg/m   Physical Exam Vitals and nursing note reviewed.  Constitutional:      Appearance: She is well-developed.  HENT:      Head: Normocephalic and atraumatic.  Cardiovascular:     Rate and Rhythm: Normal rate.  Pulmonary:     Effort: Pulmonary effort is normal.     Breath sounds: No rales.  Abdominal:     General: Bowel sounds are normal.  Musculoskeletal:     Cervical back: Normal range of motion and neck supple.     Right lower leg: Edema present.     Left lower leg: Edema present.  Skin:    General: Skin is warm and dry.  Neurological:     Mental Status: She is alert and oriented to person, place, and time.     ED Results / Procedures / Treatments   Labs (all labs ordered are listed, but only abnormal results are displayed) Labs Reviewed  BASIC METABOLIC PANEL - Abnormal; Notable for the following components:      Result Value   Sodium 132 (*)    Glucose, Bld 109 (*)    Creatinine, Ser 1.10 (*)    GFR, Estimated 53 (*)    All other components within normal limits  CBC WITH DIFFERENTIAL/PLATELET - Abnormal; Notable for the following components:   RBC 3.15 (*)    Hemoglobin 10.4 (*)    HCT 31.7 (*)    MCV 100.6 (*)    Lymphs Abs 0.6 (*)    All other components within normal limits  RESP PANEL BY RT-PCR (FLU A&B, COVID) ARPGX2  BRAIN NATRIURETIC PEPTIDE    EKG EKG Interpretation  Date/Time:  Wednesday January 20 2021 19:30:11 EDT Ventricular Rate:  59 PR Interval:    QRS Duration: 126 QT Interval:  449 QTC Calculation: 445 R Axis:   68 Text Interpretation: Junctional rhythm Nonspecific intraventricular conduction delay Repol abnrm suggests ischemia, diffuse leads No acute changes Nonspecific ST and T wave abnormality No significant change since last tracing Confirmed by Varney Biles [phone]) on 01/20/2021 9:48:20 PM   Radiology DG Chest 2 View  Result Date: 01/20/2021 CLINICAL DATA:  Weakness and shortness of breath on exertion for 2 weeks. Increased edema in the extremities. EXAM: CHEST - 2 VIEW COMPARISON:  04/06/2016 FINDINGS: Postoperative changes in the mediastinum. Cardiac  pacemaker. Cardiac enlargement. No vascular congestion, edema, or consolidation is appreciated. Patient rotation does limit examination. No pleural effusions. No pneumothorax. Calcification of the aorta. IMPRESSION: Cardiac enlargement. No evidence of active pulmonary disease. Electronically Signed   By: Gwyndolyn Saxon  Gerilyn Nestle M.D.   On: 01/20/2021 23:16    Procedures Procedures   Medications Ordered in ED Medications - No data to display  ED Course  I have reviewed the triage vital signs and the nursing notes.  Pertinent labs & imaging results that were available during my care of the patient were reviewed by me and considered in my medical decision making (see chart for details).    MDM Rules/Calculators/A&P                         75 year old female with history of CHF, V. fib with AICD placement comes in a chief complaint of shortness of breath.  She also appears to be having pulmonary hypertension based on the last echo.  She is noted to have acute right-sided failure with bilateral lower extremity 3+ pitting edema.  This is unusual for her.  She is also having exertional shortness of breath.  Based on history, low suspicion for infection, PE.  Prior concerns for decompensating CHF.  X-rays and physical exam reassuring.  Will admit for right-sided heart failure.  Final Clinical Impression(s) / ED Diagnoses Final diagnoses:  Acute right-sided congestive heart failure [facility])    Rx / DC Orders ED Discharge Orders    None       Varney Biles, MD 01/21/21 [phone]

## 2021-01-20 NOTE — ED Triage Notes (Signed)
Patient arrives from home via EMS with complaint of weakness and SOB upon exertion x 2 weeks. Hx of CHF, pt reports increased edema in both extremeties. EMS reports O2 sats 89% when walking into the house, 100 % once placed on 3 L O2.   18 g LAC  EMS vitals BP 166/96 HR 62 RR 20 CBG 128

## 2021-01-21 ENCOUNTER — Inpatient Hospital Stay (HOSPITAL_COMMUNITY): Payer: Medicare Other

## 2021-01-21 ENCOUNTER — Other Ambulatory Visit: Payer: Self-pay

## 2021-01-21 DIAGNOSIS — I5023 Acute on chronic systolic (congestive) heart failure: Secondary | ICD-10-CM

## 2021-01-21 DIAGNOSIS — I50811 Acute right heart failure: Secondary | ICD-10-CM | POA: Diagnosis not present

## 2021-01-21 DIAGNOSIS — M7989 Other specified soft tissue disorders: Secondary | ICD-10-CM | POA: Diagnosis present

## 2021-01-21 DIAGNOSIS — J9601 Acute respiratory failure with hypoxia: Secondary | ICD-10-CM

## 2021-01-21 DIAGNOSIS — F419 Anxiety disorder, unspecified: Secondary | ICD-10-CM | POA: Diagnosis present

## 2021-01-21 DIAGNOSIS — Z95810 Presence of automatic (implantable) cardiac defibrillator: Secondary | ICD-10-CM

## 2021-01-21 DIAGNOSIS — E785 Hyperlipidemia, unspecified: Secondary | ICD-10-CM | POA: Diagnosis present

## 2021-01-21 DIAGNOSIS — E039 Hypothyroidism, unspecified: Secondary | ICD-10-CM | POA: Diagnosis present

## 2021-01-21 DIAGNOSIS — I4901 Ventricular fibrillation: Secondary | ICD-10-CM

## 2021-01-21 DIAGNOSIS — I5021 Acute systolic (congestive) heart failure: Secondary | ICD-10-CM | POA: Diagnosis not present

## 2021-01-21 DIAGNOSIS — S99922A Unspecified injury of left foot, initial encounter: Secondary | ICD-10-CM

## 2021-01-21 DIAGNOSIS — D539 Nutritional anemia, unspecified: Secondary | ICD-10-CM | POA: Diagnosis present

## 2021-01-21 DIAGNOSIS — I110 Hypertensive heart disease with heart failure: Secondary | ICD-10-CM | POA: Diagnosis present

## 2021-01-21 DIAGNOSIS — E538 Deficiency of other specified B group vitamins: Secondary | ICD-10-CM | POA: Diagnosis present

## 2021-01-21 DIAGNOSIS — N289 Disorder of kidney and ureter, unspecified: Secondary | ICD-10-CM | POA: Diagnosis present

## 2021-01-21 DIAGNOSIS — M79606 Pain in leg, unspecified: Secondary | ICD-10-CM | POA: Diagnosis present

## 2021-01-21 DIAGNOSIS — E871 Hypo-osmolality and hyponatremia: Secondary | ICD-10-CM | POA: Diagnosis present

## 2021-01-21 DIAGNOSIS — Z8674 Personal history of sudden cardiac arrest: Secondary | ICD-10-CM | POA: Diagnosis not present

## 2021-01-21 DIAGNOSIS — I4891 Unspecified atrial fibrillation: Secondary | ICD-10-CM | POA: Diagnosis not present

## 2021-01-21 DIAGNOSIS — Z20822 Contact with and (suspected) exposure to covid-19: Secondary | ICD-10-CM | POA: Diagnosis present

## 2021-01-21 DIAGNOSIS — S99922S Unspecified injury of left foot, sequela: Secondary | ICD-10-CM

## 2021-01-21 DIAGNOSIS — W109XXA Fall (on) (from) unspecified stairs and steps, initial encounter: Secondary | ICD-10-CM | POA: Diagnosis present

## 2021-01-21 DIAGNOSIS — I509 Heart failure, unspecified: Secondary | ICD-10-CM

## 2021-01-21 DIAGNOSIS — Z23 Encounter for immunization: Secondary | ICD-10-CM | POA: Diagnosis not present

## 2021-01-21 DIAGNOSIS — G629 Polyneuropathy, unspecified: Secondary | ICD-10-CM | POA: Diagnosis present

## 2021-01-21 DIAGNOSIS — I480 Paroxysmal atrial fibrillation: Secondary | ICD-10-CM | POA: Diagnosis present

## 2021-01-21 DIAGNOSIS — Z952 Presence of prosthetic heart valve: Secondary | ICD-10-CM | POA: Diagnosis not present

## 2021-01-21 DIAGNOSIS — K219 Gastro-esophageal reflux disease without esophagitis: Secondary | ICD-10-CM | POA: Diagnosis present

## 2021-01-21 DIAGNOSIS — Z4509 Encounter for adjustment and management of other cardiac device: Secondary | ICD-10-CM | POA: Diagnosis not present

## 2021-01-21 DIAGNOSIS — I420 Dilated cardiomyopathy: Secondary | ICD-10-CM | POA: Diagnosis present

## 2021-01-21 DIAGNOSIS — R000 Tachycardia, unspecified: Secondary | ICD-10-CM | POA: Diagnosis present

## 2021-01-21 LAB — ECHOCARDIOGRAM COMPLETE
Area-P 1/2: 3.53 cm2
Height: 64 in
MV VTI: 2.64 cm2
S' Lateral: 5 cm
Single Plane A4C EF: 16.1 %
Weight: 2720 oz

## 2021-01-21 LAB — BRAIN NATRIURETIC PEPTIDE
B Natriuretic Peptide: >4500 pg/mL — ABNORMAL HIGH (ref 0.0–100.0)
B Natriuretic Peptide: >4500 pg/mL — ABNORMAL HIGH (ref 0.0–100.0)

## 2021-01-21 LAB — CBC
HCT: 32 % — ABNORMAL LOW (ref 36.0–46.0)
Hemoglobin: 10.4 g/dL — ABNORMAL LOW (ref 12.0–15.0)
MCH: 32.8 pg (ref 26.0–34.0)
MCHC: 32.5 g/dL (ref 30.0–36.0)
MCV: 100.9 fL — ABNORMAL HIGH (ref 80.0–100.0)
Platelets: 285 10*3/uL (ref 150–400)
RBC: 3.17 MIL/uL — ABNORMAL LOW (ref 3.87–5.11)
RDW: 14.2 % (ref 11.5–15.5)
WBC: 5.5 10*3/uL (ref 4.0–10.5)
nRBC: 0 % (ref 0.0–0.2)

## 2021-01-21 LAB — BASIC METABOLIC PANEL
Anion gap: 10 (ref 5–15)
Anion gap: 9 (ref 5–15)
BUN: 13 mg/dL (ref 8–23)
BUN: 13 mg/dL (ref 8–23)
CO2: 24 mmol/L (ref 22–32)
CO2: 25 mmol/L (ref 22–32)
Calcium: 8.9 mg/dL (ref 8.9–10.3)
Calcium: 9.2 mg/dL (ref 8.9–10.3)
Chloride: 100 mmol/L (ref 98–111)
Chloride: 99 mmol/L (ref 98–111)
Creatinine, Ser: 1.1 mg/dL — ABNORMAL HIGH (ref 0.44–1.00)
Creatinine, Ser: 1.14 mg/dL — ABNORMAL HIGH (ref 0.44–1.00)
GFR, Estimated: 51 mL/min — ABNORMAL LOW (ref >60)
GFR, Estimated: 53 mL/min — ABNORMAL LOW (ref >60)
Glucose, Bld: 102 mg/dL — ABNORMAL HIGH (ref 70–99)
Glucose, Bld: 109 mg/dL — ABNORMAL HIGH (ref 70–99)
Potassium: 4.3 mmol/L (ref 3.5–5.1)
Potassium: 4.7 mmol/L (ref 3.5–5.1)
Sodium: 132 mmol/L — ABNORMAL LOW (ref 135–145)
Sodium: 135 mmol/L (ref 135–145)

## 2021-01-21 LAB — RESP PANEL BY RT-PCR (FLU A&B, COVID) ARPGX2
Influenza A by PCR: NEGATIVE
Influenza B by PCR: NEGATIVE
SARS Coronavirus 2 by RT PCR: NEGATIVE

## 2021-01-21 LAB — TROPONIN I (HIGH SENSITIVITY)
Troponin I (High Sensitivity): 23 ng/L — ABNORMAL HIGH (ref <18)
Troponin I (High Sensitivity): 26 ng/L — ABNORMAL HIGH (ref <18)

## 2021-01-21 LAB — VITAMIN B12: Vitamin B-12: 91 pg/mL — ABNORMAL LOW (ref 180–914)

## 2021-01-21 LAB — FOLATE: Folate: 16.5 ng/mL (ref >5.9)

## 2021-01-21 LAB — TSH: TSH: 20.415 u[IU]/mL — ABNORMAL HIGH (ref 0.350–4.500)

## 2021-01-21 MED ORDER — ENOXAPARIN SODIUM 40 MG/0.4ML ~~LOC~~ SOLN
40.0000 mg | SUBCUTANEOUS | Status: DC
  Administered 2021-01-21 – 2021-01-24 (×4): 40 mg via SUBCUTANEOUS
  Filled 2021-01-21 (×4): qty 0.4

## 2021-01-21 MED ORDER — LEVOTHYROXINE SODIUM 100 MCG PO TABS
100.0000 ug | ORAL_TABLET | Freq: Every day | ORAL | Status: DC
  Administered 2021-01-21: 100 ug via ORAL
  Filled 2021-01-21: qty 1

## 2021-01-21 MED ORDER — SACUBITRIL-VALSARTAN 97-103 MG PO TABS
1.0000 | ORAL_TABLET | Freq: Two times a day (BID) | ORAL | Status: DC
  Administered 2021-01-21 – 2021-01-24 (×8): 1 via ORAL
  Filled 2021-01-21 (×8): qty 1

## 2021-01-21 MED ORDER — CARVEDILOL 3.125 MG PO TABS
3.1250 mg | ORAL_TABLET | Freq: Two times a day (BID) | ORAL | Status: DC
  Administered 2021-01-22 – 2021-01-24 (×5): 3.125 mg via ORAL
  Filled 2021-01-21 (×5): qty 1

## 2021-01-21 MED ORDER — SALINE SPRAY 0.65 % NA SOLN
1.0000 | NASAL | Status: DC | PRN
  Administered 2021-01-21 – 2021-01-23 (×2): 1 via NASAL
  Filled 2021-01-21 (×2): qty 44

## 2021-01-21 MED ORDER — FLUTICASONE PROPIONATE 50 MCG/ACT NA SUSP
2.0000 | Freq: Every day | NASAL | Status: DC
  Administered 2021-01-22 – 2021-01-24 (×3): 2 via NASAL
  Filled 2021-01-21: qty 16

## 2021-01-21 MED ORDER — EMPAGLIFLOZIN 10 MG PO TABS
10.0000 mg | ORAL_TABLET | Freq: Every day | ORAL | Status: DC
  Administered 2021-01-22 – 2021-01-24 (×3): 10 mg via ORAL
  Filled 2021-01-21 (×3): qty 1

## 2021-01-21 MED ORDER — CYANOCOBALAMIN 1000 MCG/ML IJ SOLN
1000.0000 ug | Freq: Once | INTRAMUSCULAR | Status: AC
  Administered 2021-01-21: 1000 ug via INTRAMUSCULAR
  Filled 2021-01-21: qty 1

## 2021-01-21 MED ORDER — PRAVASTATIN SODIUM 20 MG PO TABS
40.0000 mg | ORAL_TABLET | Freq: Every day | ORAL | Status: DC
  Administered 2021-01-21 – 2021-01-24 (×4): 40 mg via ORAL
  Filled 2021-01-21 (×4): qty 2

## 2021-01-21 MED ORDER — VITAMIN B-12 1000 MCG PO TABS
1000.0000 ug | ORAL_TABLET | Freq: Every day | ORAL | Status: DC
  Administered 2021-01-22 – 2021-01-24 (×3): 1000 ug via ORAL
  Filled 2021-01-21 (×4): qty 1

## 2021-01-21 MED ORDER — LEVOTHYROXINE SODIUM 25 MCG PO TABS
125.0000 ug | ORAL_TABLET | Freq: Every day | ORAL | Status: DC
  Administered 2021-01-22 – 2021-01-24 (×3): 125 ug via ORAL
  Filled 2021-01-21 (×3): qty 1

## 2021-01-21 MED ORDER — ACETAMINOPHEN 325 MG PO TABS
650.0000 mg | ORAL_TABLET | Freq: Four times a day (QID) | ORAL | Status: DC | PRN
  Administered 2021-01-21 – 2021-01-24 (×6): 650 mg via ORAL
  Filled 2021-01-21 (×6): qty 2

## 2021-01-21 MED ORDER — SPIRONOLACTONE 12.5 MG HALF TABLET
12.5000 mg | ORAL_TABLET | Freq: Every day | ORAL | Status: DC
  Administered 2021-01-21 – 2021-01-24 (×4): 12.5 mg via ORAL
  Filled 2021-01-21 (×4): qty 1

## 2021-01-21 MED ORDER — FUROSEMIDE 10 MG/ML IJ SOLN
40.0000 mg | Freq: Every day | INTRAMUSCULAR | Status: DC
  Administered 2021-01-21 – 2021-01-24 (×4): 40 mg via INTRAVENOUS
  Filled 2021-01-21 (×4): qty 4

## 2021-01-21 MED ORDER — PAROXETINE HCL 20 MG PO TABS
60.0000 mg | ORAL_TABLET | Freq: Every evening | ORAL | Status: DC
  Administered 2021-01-21 – 2021-01-23 (×3): 60 mg via ORAL
  Filled 2021-01-21 (×3): qty 3

## 2021-01-21 MED ORDER — FOLIC ACID 1 MG PO TABS
1.0000 mg | ORAL_TABLET | Freq: Every day | ORAL | Status: DC
  Administered 2021-01-21 – 2021-01-24 (×4): 1 mg via ORAL
  Filled 2021-01-21 (×4): qty 1

## 2021-01-21 MED ORDER — AMIODARONE HCL 200 MG PO TABS
200.0000 mg | ORAL_TABLET | ORAL | Status: DC
  Administered 2021-01-21 – 2021-01-22 (×2): 200 mg via ORAL
  Filled 2021-01-21 (×2): qty 1

## 2021-01-21 MED ORDER — ONDANSETRON HCL 4 MG/2ML IJ SOLN: 4.0000 mg | Freq: Four times a day (QID) | INTRAMUSCULAR | Status: DC | PRN

## 2021-01-21 MED ORDER — EMPAGLIFLOZIN 10 MG PO TABS
10.0000 mg | ORAL_TABLET | Freq: Every day | ORAL | Status: DC
  Filled 2021-01-21: qty 1

## 2021-01-21 MED ORDER — METOPROLOL SUCCINATE ER 50 MG PO TB24
50.0000 mg | ORAL_TABLET | Freq: Every morning | ORAL | Status: DC
  Administered 2021-01-21: 50 mg via ORAL
  Filled 2021-01-21: qty 1

## 2021-01-21 NOTE — Consult Note (Signed)
Reason for Consult: Decompensated systolic congestive heart failure Referring Physician: [facility] hospitalist  [name] is an 75 y.o. female.  HPI: Patient is 75 year old female with past medical history significant for nonischemic cardiomyopathy, history of cardiac arrest status post ICD in the past, history of severe mitral regurgitation status post MVR, history of paroxysmal atrial fibrillation, hypertension, hyperlipidemia, hypothyroidism, GERD, who was admitted yesterday because of progressive increasing shortness of breath associated with leg swelling.  Patient denies any chest pain nausea vomiting diaphoresis.  Denies noncompliance to medication denies excessive salty food intake denies fever or chills.  Patient does give history of PND orthopnea and leg swelling.  Patient received IV Lasix with good diuresis and improvement in her symptoms 2D echo done today showed markedly depressed LV systolic function as compared to prior echo which showed EF of approximately 45%.  Past Medical History:  Diagnosis Date  . Anxiety   . Arthritis   . Automatic implantable cardioverter-defibrillator in situ   . Cardiomegaly   . CHF (congestive heart failure) ([facility]) 05/2013   class III, severe  . Chronic bronchitis ([place])   . Depression   . GERD (gastroesophageal reflux disease)   . Headache(784.0)    migraines  . Heart murmur   . History of sudden cardiac arrest successfully resuscitated 2011  . HLD (hyperlipidemia)   . Hypertension   . Hypothyroidism   . IBS (irritable bowel syndrome)   . Paroxysmal atrial fibrillation ([facility])   . Pneumonia   . Scoliosis   . Severe mitral regurgitation 05/2013   s/p MV repair with annuloplasty    Past Surgical History:  Procedure Laterality Date  . ABDOMINAL HYSTERECTOMY    . APPENDECTOMY    . CARDIAC CATHETERIZATION    . CARDIAC DEFIBRILLATOR PLACEMENT  2010   Vfib arrest, single chamber ICD  . CARPAL TUNNEL RELEASE Right 03/05/2014   Procedure: RIGHT  CARPAL TUNNEL RELEASE;  Surgeon: Schuyler Amor, MD;  Location: [facility];  Service: Orthopedics;  Laterality: Right;  . CATARACT EXTRACTION W/PHACO Left 09/03/2020   Procedure: CATARACT EXTRACTION PHACO AND INTRAOCULAR LENS PLACEMENT ([place]) LEFT;  Surgeon: Birder Robson, MD;  Location: [facility];  Service: Ophthalmology;  Laterality: Left;  Lot # [id number] [place]: 01:04.1 CDE:11:62   . CATARACT EXTRACTION W/PHACO Right 12/31/2020   Procedure: CATARACT EXTRACTION PHACO AND INTRAOCULAR LENS PLACEMENT ([place]) RIGHT;  Surgeon: Birder Robson, MD;  Location: [facility];  Service: Ophthalmology;  Laterality: Right;  Lot [id number] [place]: 00:55.8 CDE: 6.29  . COLONOSCOPY    . FRACTURE SURGERY    . HERNIA REPAIR  as child   x 2  . INTRAOPERATIVE TRANSESOPHAGEAL ECHOCARDIOGRAM N/A 05/24/2013   Procedure: INTRAOPERATIVE TRANSESOPHAGEAL ECHOCARDIOGRAM;  Surgeon: Ivin Poot, MD;  Location: [facility];  Service: Open Heart Surgery;  Laterality: N/A;  . LEFT HEART CATHETERIZATION WITH CORONARY ANGIOGRAM N/A 05/16/2013   Procedure: LEFT HEART CATHETERIZATION WITH CORONARY ANGIOGRAM;  Surgeon: Clent Demark, MD;  Location: [facility];  Service: Cardiovascular;  Laterality: N/A;  . MITRAL VALVE REPAIR N/A 05/24/2013   Surgeon: Ivin Poot, MD; Service: Open Heart Surgery  . MULTIPLE EXTRACTIONS WITH ALVEOLOPLASTY N/A 05/21/2013   Procedure: Extraction of tooth #'s 2,4,18 with alveoloplasty and gross debridement of remaining teeth;  Surgeon: Lenn Cal, DDS;  Location: [facility];  Service: Oral Surgery;  Laterality: N/A;  . OPEN REDUCTION INTERNAL FIXATION (ORIF) DISTAL RADIAL FRACTURE Right 03/05/2014   Procedure: OPEN REDUCTION INTERNAL FIXATION (ORIF) RIGHT DISTAL RADIUS FRACTURE ;  Surgeon: Schuyler Amor, MD;  Location: [facility];  Service: Orthopedics;  Laterality: Right;  . TEE WITHOUT CARDIOVERSION  09/24/2012   Procedure: TRANSESOPHAGEAL ECHOCARDIOGRAM (TEE);  Surgeon: Fay Records, MD;  Location: [facility]  ENDOSCOPY;  Service: Cardiovascular;  Laterality: N/A;    Family History  Problem Relation Age of Onset  . Cancer - Other Mother   . CAD Mother   . Arthritis Mother   . CVA Sister   . CAD Sister   . Seizures Brother   . Heart disease Father     Social History:  reports that she has never smoked. She has never used smokeless tobacco. She reports that she does not drink alcohol and does not use drugs.  Allergies:  Allergies  Allergen Reactions  . Morphine And Related Itching and Nausea And Vomiting  . Codeine Nausea And Vomiting  . Demerol [Meperidine Hcl] Nausea And Vomiting  . Meperidine Hcl Nausea And Vomiting  . Pollen Extract Itching and Other (See Comments)    Seasonal allergies    Medications: I have reviewed the patient's current medications.  Results for orders placed or performed during the hospital encounter of 01/20/21 (from the past 48 hour(s))  Basic metabolic panel     Status: Abnormal   Collection Time: 01/20/21 11:38 PM  Result Value Ref Range   Sodium 132 (L) 135 - 145 mmol/L   Potassium 4.3 3.5 - 5.1 mmol/L   Chloride 99 98 - 111 mmol/L   CO2 24 22 - 32 mmol/L   Glucose, Bld 109 (H) 70 - 99 mg/dL    Comment: Glucose reference range applies only to samples taken after fasting for at least 8 hours.   BUN 13 8 - 23 mg/dL   Creatinine, Ser 1.10 (H) 0.44 - 1.00 mg/dL   Calcium 8.9 8.9 - 10.3 mg/dL   GFR, Estimated 53 (L) >60 mL/min    Comment: (NOTE) Calculated using the CKD-EPI Creatinine Equation (2021)    Anion gap 9 5 - 15    Comment: Performed at [facility], [place] [address], [place], [place] [address]  CBC with Differential     Status: Abnormal   Collection Time: 01/20/21 11:38 PM  Result Value Ref Range   WBC 5.4 4.0 - 10.5 K/uL   RBC 3.15 (L) 3.87 - 5.11 MIL/uL   Hemoglobin 10.4 (L) 12.0 - 15.0 g/dL   HCT 31.7 (L) 36.0 - 46.0 %   MCV 100.6 (H) 80.0 - 100.0 fL   MCH 33.0 26.0 - 34.0 pg   MCHC 32.8 30.0 - 36.0 g/dL   RDW  14.1 11.5 - 15.5 %   Platelets 284 150 - 400 K/uL   nRBC 0.0 0.0 - 0.2 %   Neutrophils Relative % 82 %   Neutro Abs 4.4 1.7 - 7.7 K/uL   Lymphocytes Relative 11 %   Lymphs Abs 0.6 (L) 0.7 - 4.0 K/uL   Monocytes Relative 7 %   Monocytes Absolute 0.4 0.1 - 1.0 K/uL   Eosinophils Relative 0 %   Eosinophils Absolute 0.0 0.0 - 0.5 K/uL   Basophils Relative 0 %   Basophils Absolute 0.0 0.0 - 0.1 K/uL   Immature Granulocytes 0 %   Abs Immature Granulocytes 0.02 0.00 - 0.07 K/uL    Comment: Performed at [facility], [place] [address], [place], [place] [address]  Brain natriuretic peptide     Status: Abnormal   Collection Time: 01/20/21 11:38 PM  Result Value Ref  Range   B Natriuretic Peptide >4,500.0 (H) 0.0 - 100.0 pg/mL    Comment: NO VISIBLE HEMOLYSIS Performed at [facility] [address] [place], [place] [address]   Resp Panel by RT-PCR (Flu A&B, Covid) Nasopharyngeal Swab     Status: None   Collection Time: 01/20/21 11:40 PM   Specimen: Nasopharyngeal Swab; Nasopharyngeal(NP) swabs in vial transport medium  Result Value Ref Range   SARS Coronavirus 2 by RT PCR NEGATIVE NEGATIVE    Comment: (NOTE) SARS-CoV-2 target nucleic acids are NOT DETECTED.  The SARS-CoV-2 RNA is generally detectable in upper respiratory specimens during the acute phase of infection. The lowest concentration of SARS-CoV-2 viral copies this assay can detect is 138 copies/mL. A negative result does not preclude SARS-Cov-2 infection and should not be used as the sole basis for treatment or other patient management decisions. A negative result may occur with  improper specimen collection/handling, submission of specimen other than nasopharyngeal swab, presence of viral mutation(s) within the areas targeted by this assay, and inadequate number of viral copies(<138 copies/mL). A negative result must be combined with clinical observations, patient history, and  epidemiological information. The expected result is Negative.  Fact Sheet for Patients:  EntrepreneurPulse.com.au  Fact Sheet for Healthcare Providers:  IncredibleEmployment.be  This test is no t yet approved or cleared by the [place] FDA and  has been authorized for detection and/or diagnosis of SARS-CoV-2 by FDA under an Emergency Use Authorization (EUA). This EUA will remain  in effect (meaning this test can be used) for the duration of the COVID-19 declaration under Section 564(b)(1) of the Act, 21 U.S.C.section 360bbb-3(b)(1), unless the authorization is terminated  or revoked sooner.    specimens during the acute phase of infection. The lowest concentration of SARS-CoV-2 viral copies this assay can detect is 138 copies/mL. A negative result does not preclude SARS-Cov-2 infection and should not be used as the sole basis for treatment or other patient management decisions. A negative result may occur with  improper specimen collection/handling, submission of specimen other than nasopharyngeal swab, presence of viral mutation(s) within the areas targeted by this assay, and inadequate number of viral copies(<138 copies/mL). A negative result must be c ombined with clinical observations, patient history, and epidemiological information. The expected result is Negative.  Fact Sheet for Patients:  EntrepreneurPulse.com.au  Fact Sheet for Healthcare Providers:  IncredibleEmployment.be  This test is not yet approved or cleared by the [place] FDA and  has been authorized for detection and/or diagnosis of SARS-CoV-2 by FDA under an Emergency Use Authorization (EUA). This EUA will remain  in effect (meaning this test can be used) for the duration of the COVID-19 declaration under Section 564(b)(1) of the Act, 21 U.S.C.section 360bbb-3(b)(1), unless the authorization is terminated  or revoked sooner.        Influenza A by PCR NEGATIVE NEGATIVE   Influenza B by PCR NEGATIVE NEGATIVE    Comment: (NOTE) The Xpert Xpress SARS-CoV-2/FLU/RSV plus assay is intended as an aid in the diagnosis of influenza from Nasopharyngeal swab specimens and should not be used as a sole basis for treatment. Nasal washings and aspirates are unacceptable for Xpert Xpress SARS-CoV-2/FLU/RSV testing.  Fact Sheet for Patients: EntrepreneurPulse.com.au  Fact Sheet for Healthcare Providers: IncredibleEmployment.be  This test is not yet approved or cleared by the [place] FDA and has been authorized for detection and/or diagnosis of SARS-CoV-2 by FDA under an Emergency Use Authorization (EUA). This EUA will remain in effect (meaning  this test can be used) for the duration of the COVID-19 declaration under Section 564(b)(1) of the Act, 21 U.S.C. section 360bbb-3(b)(1), unless the authorization is terminated or revoked.  Performed at [facility], [place] [address] [place], [place] [address]   Troponin I (High Sensitivity)     Status: Abnormal   Collection Time: 01/21/21 12:40 AM  Result Value Ref Range   Troponin I (High Sensitivity) 23 (H) <18 ng/L    Comment: (NOTE) Elevated high sensitivity troponin I (hsTnI) values and significant  changes across serial measurements may suggest ACS but many other  chronic and acute conditions are known to elevate hsTnI results.  Refer to the "Links" section for chest pain algorithms and additional  guidance. Performed at [facility], [place] [address] [place], [place] [address]   Vitamin B12     Status: Abnormal   Collection Time: 01/21/21  1:22 AM  Result Value Ref Range   Vitamin B-12 91 (L) 180 - 914 pg/mL    Comment: (NOTE) This assay is not validated for testing neonatal or myeloproliferative syndrome specimens for Vitamin B12 levels. Performed at [facility],  [place] [address], [place], [place] [address]   Folate, serum, performed at [facility] lab     Status: None   Collection Time: 01/21/21  1:23 AM  Result Value Ref Range   Folate 16.5 >5.9 ng/mL    Comment: Performed at [facility], [place] [address] [place], [place] [id number]  Basic metabolic panel     Status: Abnormal   Collection Time: 01/21/21  2:28 AM  Result Value Ref Range   Sodium 135 135 - 145 mmol/L   Potassium 4.7 3.5 - 5.1 mmol/L   Chloride 100 98 - 111 mmol/L   CO2 25 22 - 32 mmol/L   Glucose, Bld 102 (H) 70 - 99 mg/dL    Comment: Glucose reference range applies only to samples taken after fasting for at least 8 hours.   BUN 13 8 - 23 mg/dL   Creatinine, Ser 1.14 (H) 0.44 - 1.00 mg/dL   Calcium 9.2 8.9 - 10.3 mg/dL   GFR, Estimated 51 (L) >60 mL/min    Comment: (NOTE) Calculated using the CKD-EPI Creatinine Equation (2021)    Anion gap 10 5 - 15    Comment: Performed at [facility], [place] [address] [place], [place] [address]  CBC     Status: Abnormal   Collection Time: 01/21/21  2:28 AM  Result Value Ref Range   WBC 5.5 4.0 - 10.5 K/uL   RBC 3.17 (L) 3.87 - 5.11 MIL/uL   Hemoglobin 10.4 (L) 12.0 - 15.0 g/dL   HCT 32.0 (L) 36.0 - 46.0 %   MCV 100.9 (H) 80.0 - 100.0 fL   MCH 32.8 26.0 - 34.0 pg   MCHC 32.5 30.0 - 36.0 g/dL   RDW 14.2 11.5 - 15.5 %   Platelets 285 150 - 400 K/uL   nRBC 0.0 0.0 - 0.2 %    Comment: Performed at [facility], [place] [address], [place], [place] [address]  TSH     Status: Abnormal   Collection Time: 01/21/21  2:28 AM  Result Value Ref Range   TSH 20.415 (H) 0.350 - 4.500 uIU/mL    Comment: Performed by a 3rd Generation assay with a functional sensitivity of <=0.01 uIU/mL. Performed at [facility], [place] [address], [place], [place] [address]   Troponin  I (High Sensitivity)     Status: Abnormal   Collection Time: 01/21/21  2:28 AM  Result Value Ref Range    Troponin I (High Sensitivity) 26 (H) <18 ng/L    Comment: (NOTE) Elevated high sensitivity troponin I (hsTnI) values and significant  changes across serial measurements may suggest ACS but many other  chronic and acute conditions are known to elevate hsTnI results.  Refer to the "Links" section for chest pain algorithms and additional  guidance. Performed at [facility], [place] [address], [place], [place] [address]   Brain natriuretic peptide     Status: Abnormal   Collection Time: 01/21/21 11:57 AM  Result Value Ref Range   B Natriuretic Peptide >4,500.0 (H) 0.0 - 100.0 pg/mL    Comment: Performed at [facility], [place] [address], [place], [place] [address]    DG Chest 2 View  Result Date: 01/20/2021 CLINICAL DATA:  Weakness and shortness of breath on exertion for 2 weeks. Increased edema in the extremities. EXAM: CHEST - 2 VIEW COMPARISON:  04/06/2016 FINDINGS: Postoperative changes in the mediastinum. Cardiac pacemaker. Cardiac enlargement. No vascular congestion, edema, or consolidation is appreciated. Patient rotation does limit examination. No pleural effusions. No pneumothorax. Calcification of the aorta. IMPRESSION: Cardiac enlargement. No evidence of active pulmonary disease. Electronically Signed   By: Lucienne Capers M.D.   On: 01/20/2021 23:16   DG Foot 2 Views Left  Result Date: 01/21/2021 CLINICAL DATA:  Trip and fall injury 2 days ago. Bruising to the posterior surface of the foot and anterior second and third metatarsal area. EXAM: LEFT FOOT - 2 VIEW COMPARISON:  None. FINDINGS: Soft tissue swelling over the dorsum of the foot. No acute fracture or dislocation. Old fracture deformity of the fifth metatarsal shaft. Degenerative changes in the interphalangeal, intertarsal and first metatarsal-phalangeal joints. No focal bone lesion or bone destruction. Vascular calcifications in the soft tissues. IMPRESSION: Soft tissue swelling. No acute bony  abnormalities. Degenerative changes. Electronically Signed   By: Lucienne Capers M.D.   On: 01/21/2021 01:20   ECHOCARDIOGRAM COMPLETE  Result Date: 01/21/2021    ECHOCARDIOGRAM REPORT   Patient Name:   [name] Date of Exam: 01/21/2021 Medical Rec #:  [id number]    Height:       64.0 in Accession #:    [id number]   Weight:       170.0 lb Date of Birth:  12-05-1945    BSA:          1.826 m Patient Age:    76 years     BP:           149/76 mmHg Patient Gender: F            HR:           60 bpm. Exam Location:  Inpatient Procedure: 2D Echo, Cardiac Doppler and Color Doppler Indications:    CHF  History:        Patient has prior history of Echocardiogram examinations, most                 recent 05/15/2013. CHF, Defibrillator, Mitral Valve Disease and                 MV repair, Arrythmias:Atrial Fibrillation,                 Signs/Symptoms:Shortness of Breath and LE edema; Risk  Factors:Hypertension and Dyslipidemia.  Sonographer:    Dustin Flock Referring Phys: [id number] [place]  1. Septal / lateral hypokinesis inferior akinesis . Left ventricular ejection fraction, by estimation, is 30 to 35%. The left ventricle has moderately decreased function. The left ventricle has no regional wall motion abnormalities. The left ventricular  internal cavity size was moderately dilated. There is mild left ventricular hypertrophy. Left ventricular diastolic parameters are indeterminate.  2. AICD wires noted . Right ventricular systolic function is normal. The right ventricular size is normal. There is moderately elevated pulmonary artery systolic pressure.  3. Left atrial size was moderately dilated.  4. Right atrial size was mildly dilated.  5. Post repair trivial residual MR MVA PT1/2 3.14 and mean gradient at HR 59 bpm only 3 mmHg . The mitral valve has been repaired/replaced. Trivial mitral valve regurgitation. No evidence of mitral stenosis.  6. The aortic valve is tricuspid. There is mild  calcification of the aortic valve. Aortic valve regurgitation is not visualized. Mild aortic valve sclerosis is present, with no evidence of aortic valve stenosis.  7. The inferior vena cava is dilated in size with >50% respiratory variability, suggesting right atrial pressure of 8 mmHg. FINDINGS  Left Ventricle: Septal / lateral hypokinesis inferior akinesis. Left ventricular ejection fraction, by estimation, is 30 to 35%. The left ventricle has moderately decreased function. The left ventricle has no regional wall motion abnormalities. The left  ventricular internal cavity size was moderately dilated. There is mild left ventricular hypertrophy. Left ventricular diastolic parameters are indeterminate. Right Ventricle: AICD wires noted. The right ventricular size is normal. No increase in right ventricular wall thickness. Right ventricular systolic function is normal. There is moderately elevated pulmonary artery systolic pressure. The tricuspid regurgitant velocity is 3.43 m/s, and with an assumed right atrial pressure of 3 mmHg, the estimated right ventricular systolic pressure is [id number] mmHg. Left Atrium: Left atrial size was moderately dilated. Right Atrium: Right atrial size was mildly dilated. Pericardium: There is no evidence of pericardial effusion. Mitral Valve: Post repair trivial residual MR MVA PT1/2 3.14 and mean gradient at HR 59 bpm only 3 mmHg. The mitral valve has been repaired/replaced. Trivial mitral valve regurgitation. No evidence of mitral valve stenosis. MV peak gradient, 10.8 mmHg. The mean mitral valve gradient is 3.0 mmHg. Tricuspid Valve: The tricuspid valve is normal in structure. Tricuspid valve regurgitation is mild . No evidence of tricuspid stenosis. Aortic Valve: The aortic valve is tricuspid. There is mild calcification of the aortic valve. Aortic valve regurgitation is not visualized. Mild aortic valve sclerosis is present, with no evidence of aortic valve stenosis. Pulmonic Valve:  The pulmonic valve was normal in structure. Pulmonic valve regurgitation is not visualized. No evidence of pulmonic stenosis. Aorta: The aortic root is normal in size and structure. Venous: The inferior vena cava is dilated in size with greater than 50% respiratory variability, suggesting right atrial pressure of 8 mmHg. IAS/Shunts: No atrial level shunt detected by color flow Doppler.  LEFT VENTRICLE PLAX 2D LVIDd:         5.30 cm      Diastology LVIDs:         5.00 cm      LV e' medial:    4.68 cm/s LV PW:         1.20 cm      LV E/e' medial:  32.1 LV IVS:        1.20 cm      LV e'  lateral:   10.00 cm/s LVOT diam:     2.70 cm      LV E/e' lateral: 15.0 LV SV:         116 LV SV Index:   63 LVOT Area:     5.73 cm  LV Volumes (MOD) LV vol d, MOD A4C: 137.0 ml LV vol s, MOD A4C: 115.0 ml LV SV MOD A4C:     137.0 ml RIGHT VENTRICLE RV Basal diam:  3.50 cm RV S prime:     6.53 cm/s TAPSE (M-mode): 1.9 cm LEFT ATRIUM              Index       RIGHT ATRIUM           Index LA diam:        5.10 cm  2.79 cm/m  RA Area:     19.60 cm LA Vol (A2C):   120.0 ml 65.72 ml/m RA Volume:   60.00 ml  32.86 ml/m LA Vol (A4C):   82.1 ml  44.97 ml/m LA Biplane Vol: 101.0 ml 55.32 ml/m  AORTIC VALVE LVOT Vmax:   87.10 cm/s LVOT Vmean:  70.400 cm/s LVOT VTI:    0.202 m  AORTA Ao Root diam: 2.90 cm MITRAL VALVE                TRICUSPID VALVE MV Area (PHT): 3.53 cm     TR Peak grad:   47.1 mmHg MV Area VTI:   2.64 cm     TR Vmax:        343.00 cm/s MV Peak grad:  10.8 mmHg MV Mean grad:  3.0 mmHg     SHUNTS MV Vmax:       1.64 m/s     Systemic VTI:  0.20 m MV Vmean:      81.0 cm/s    Systemic Diam: 2.70 cm MV Decel Time: 215 msec MV E velocity: 150.00 cm/s MV A velocity: 53.90 cm/s MV E/A ratio:  2.78 Jenkins Rouge MD Electronically signed by Jenkins Rouge MD Signature Date/Time: 01/21/2021/2:08:58 PM    Final     Review of Systems  Constitutional: Negative for chills, diaphoresis and fever.  HENT: Negative for sore throat.   Eyes:  Negative for discharge.  Respiratory: Positive for shortness of breath. Negative for wheezing.   Cardiovascular: Positive for leg swelling. Negative for chest pain and palpitations.  Gastrointestinal: Negative for abdominal distention.  Genitourinary: Negative for difficulty urinating.  Neurological: Negative for dizziness.   Blood pressure (!) 149/76, pulse 60, temperature 97.9 F (36.6 C), temperature source Oral, resp. rate 18, height '5\' 4"'$  (1.626 m), weight 77.1 kg, SpO2 98 %. Physical Exam Constitutional:      Appearance: She is well-developed.  HENT:     Head: Normocephalic and atraumatic.  Eyes:     Extraocular Movements: Extraocular movements intact.     Pupils: Pupils are equal, round, and reactive to light.  Neck:     Vascular: JVD present.  Cardiovascular:     Rate and Rhythm: Normal rate and regular rhythm.     Heart sounds: Murmur (2/6 systolic murmur and soft S3 gallop noted) heard.    Pulmonary:     Comments: Decreased breath sound at bases with faint Rales noted Abdominal:     General: Bowel sounds are normal.     Palpations: Abdomen is soft.  Musculoskeletal:     Cervical back: Normal range of motion and neck supple.  Comments: No clubbing cyanosis 1+ edema noted  Neurological:     Mental Status: She is alert.     Assessment/Plan: Resolving acute decompensated systolic congestive heart failure Nonischemic dilated cardiomyopathy History of cardiac arrest status post ICD in the past History of severe mitral regurgitation status post MVR Hypertension Hyperlipidemia Hypothyroidism History of paroxysmal atrial fibrillation on chronic anticoagulation GERD Plan We will switch Toprol-XL to carvedilol as per orders We will add low-dose spironolactone and Jardiance Increase levothyroxine to 125 mcg daily Monitor blood pressure Check labs in a.m.

## 2021-01-21 NOTE — H&P (Signed)
History and Physical    Nechelle Perrins [id number] DOB: 22-Jun-1946 DOA: 01/20/2021  PCP: Patient, No Pcp Per (Inactive)  Patient coming from: Home, lives with grandaughter  I have personally briefly reviewed patient's old medical records in [place]  Chief Complaint: LE edema and shortness of breath  HPI: [name] is a 75 y.o. female with medical history significant for chronic systolic heart failure, atrial fibrillation on amiodarone, V. fib s/p ICD, hypertension, hyperlipidemia and IBS who presents with concerns of increasing lower extremity edema and shortness of breath.  For the past 2 weeks she has had increasing lower extremity edema and pain to the point where is difficult for her to ambulate. Had been using a cane to ambulate and fell going up the stairs a few days ago. Bruised her left great toe. No syncope or loss of consciousness. Has associated shortness of breath with exertion and orthopnea.  Has cough but no fever.  Denies any chest pain.  Has been feeling nauseous for the past 3 days with decreased appetite.  No abdominal pain or diarrhea. Patient last saw cardiology back in February and had Entresto added to her medication list.  She is not on any diuretic.  ED Course: She was afebrile with mild tachycardia in the 50s-60s and hypertensive up to systolic of [id number].  CBC shows no leukocytosis.  Has macrocytic anemia with hemoglobin of 10.4.  Sodium of 132.  BG of 109.  Creatinine 1.10 with no recent baseline to compare. Chest x-ray shows cardiomegaly but no overt pulmonary edema.  Review of Systems:  Constitutional: No Weight Change, No Fever ENT/Mouth: No sore throat, No Rhinorrhea Eyes: No Eye Pain, No Vision Changes Cardiovascular: No Chest Pain, + SOB, No PND, + Dyspnea on Exertion, + Orthopnea, , + Edema, No Palpitations Respiratory: + Cough, No Sputum, No Wheezing, no Dyspnea  Gastrointestinal: + Nausea, No Vomiting, No Diarrhea, No Constipation, No  Pain Genitourinary: no Urinary Incontinence, No Urgency, No Flank Pain Musculoskeletal: No Arthralgias, No Myalgias Skin: No Skin Lesions, No Pruritus, Neuro: no Weakness, No Numbness Psych: No Anxiety/Panic, No Depression,+ decrease appetite Heme/Lymph: No Bruising, No Bleeding  Past Medical History:  Diagnosis Date  . Anxiety   . Arthritis   . Automatic implantable cardioverter-defibrillator in situ   . Cardiomegaly   . CHF (congestive heart failure) ([place]) 05/2013   class III, severe  . Chronic bronchitis ([place])   . Depression   . GERD (gastroesophageal reflux disease)   . Headache(784.0)    migraines  . Heart murmur   . History of sudden cardiac arrest successfully resuscitated 2011  . HLD (hyperlipidemia)   . Hypertension   . Hypothyroidism   . IBS (irritable bowel syndrome)   . Paroxysmal atrial fibrillation ([facility])   . Pneumonia   . Scoliosis   . Severe mitral regurgitation 05/2013   s/p MV repair with annuloplasty    Past Surgical History:  Procedure Laterality Date  . ABDOMINAL HYSTERECTOMY    . APPENDECTOMY    . CARDIAC CATHETERIZATION    . CARDIAC DEFIBRILLATOR PLACEMENT  2010   Vfib arrest, single chamber ICD  . CARPAL TUNNEL RELEASE Right 03/05/2014   Procedure: RIGHT CARPAL TUNNEL RELEASE;  Surgeon: Schuyler Amor, MD;  Location: [facility];  Service: Orthopedics;  Laterality: Right;  . CATARACT EXTRACTION W/PHACO Left 09/03/2020   Procedure: CATARACT EXTRACTION PHACO AND INTRAOCULAR LENS PLACEMENT ([place]) LEFT;  Surgeon: Birder Robson, MD;  Location: [facility];  Service: Ophthalmology;  Laterality: Left;  Lot # [id number] H [place]: 01:04.1 CDE:11:62   . CATARACT EXTRACTION W/PHACO Right 12/31/2020   Procedure: CATARACT EXTRACTION PHACO AND INTRAOCULAR LENS PLACEMENT ([place]) RIGHT;  Surgeon: Birder Robson, MD;  Location: [facility];  Service: Ophthalmology;  Laterality: Right;  Lot [id number] H [place]: 00:55.8 CDE: 6.29  . COLONOSCOPY    . FRACTURE SURGERY    . HERNIA  REPAIR  as child   x 2  . INTRAOPERATIVE TRANSESOPHAGEAL ECHOCARDIOGRAM N/A 05/24/2013   Procedure: INTRAOPERATIVE TRANSESOPHAGEAL ECHOCARDIOGRAM;  Surgeon: Ivin Poot, MD;  Location: [place];  Service: Open Heart Surgery;  Laterality: N/A;  . LEFT HEART CATHETERIZATION WITH CORONARY ANGIOGRAM N/A 05/16/2013   Procedure: LEFT HEART CATHETERIZATION WITH CORONARY ANGIOGRAM;  Surgeon: Clent Demark, MD;  Location: [facility];  Service: Cardiovascular;  Laterality: N/A;  . MITRAL VALVE REPAIR N/A 05/24/2013   Surgeon: Ivin Poot, MD; Service: Open Heart Surgery  . MULTIPLE EXTRACTIONS WITH ALVEOLOPLASTY N/A 05/21/2013   Procedure: Extraction of tooth #'s 2,4,18 with alveoloplasty and gross debridement of remaining teeth;  Surgeon: Lenn Cal, DDS;  Location: [facility];  Service: Oral Surgery;  Laterality: N/A;  . OPEN REDUCTION INTERNAL FIXATION (ORIF) DISTAL RADIAL FRACTURE Right 03/05/2014   Procedure: OPEN REDUCTION INTERNAL FIXATION (ORIF) RIGHT DISTAL RADIUS FRACTURE ;  Surgeon: Schuyler Amor, MD;  Location: [facility];  Service: Orthopedics;  Laterality: Right;  . TEE WITHOUT CARDIOVERSION  09/24/2012   Procedure: TRANSESOPHAGEAL ECHOCARDIOGRAM (TEE);  Surgeon: Fay Records, MD;  Location[facility];  Service: Cardiovascular;  Laterality: N/A;     reports that she has never smoked. She has never used smokeless tobacco. She reports that she does not drink alcohol and does not use drugs. Social History  Allergies  Allergen Reactions  . Morphine And Related Itching and Nausea And Vomiting  . Codeine Nausea And Vomiting  . Demerol [Meperidine Hcl] Nausea And Vomiting  . Meperidine Hcl Nausea And Vomiting  . Pollen Extract Itching and Other (See Comments)    Seasonal allergies    Family History  Problem Relation Age of Onset  . Cancer - Other Mother   . CAD Mother   . Arthritis Mother   . CVA Sister   . CAD Sister   . Seizures Brother   . Heart disease Father      Prior  to Admission medications   Medication Sig Start Date End Date Taking? Authorizing Provider  acetaminophen (TYLENOL) 500 MG tablet Take 1,000 mg by mouth every 6 (six) hours as needed (back/neck pain). For pain    [provider]  amiodarone (PACERONE) 200 MG tablet Take amiodarone 200 mg one tablet daily Monday through Saturday.  Do not take amiodarone on Sunday. Patient taking differently: Take 200 mg by mouth every Monday, Tuesday, Wednesday, Thursday, and Friday. In the morning 04/24/17   Evans Lance, MD  aspirin EC 81 MG tablet Take 81 mg by mouth every other day. Swallow whole.    [provider]  aspirin-acetaminophen-caffeine (EXCEDRIN MIGRAINE) [phone] MG per tablet Take 1 tablet by mouth every 6 (six) hours as needed for headache.    [provider]  ENTRESTO 97-103 MG Take 1 tablet by mouth 2 (two) times daily. 12/04/20   [provider]  Glycerin-Hypromellose-PEG 400 (VISINE DRY EYE) 0.2-0.2-1 % SOLN Place 1-2 drops into both eyes 3 (three) times daily as needed (dry/irritated eyes).    [provider]  hydrOXYzine (ATARAX/VISTARIL) 10 MG tablet Take 10 mg by mouth  at bedtime. For allergies 11/04/15   [provider]  levothyroxine (SYNTHROID) 100 MCG tablet Take 100 mcg by mouth daily before breakfast. 02/24/20   [provider]  metoprolol succinate (TOPROL-XL) 50 MG 24 hr tablet Take 50 mg by mouth every morning. 12/02/20   [provider]  PARoxetine (PAXIL) 30 MG tablet Take 60 mg by mouth every evening.    [provider]  Polyvinyl Alcohol-Povidone (MURINE TEARS FOR DRY EYES OP) Place 2 drops into both eyes 2 (two) times daily as needed. For dry eyes    [provider]  pravastatin (PRAVACHOL) 40 MG tablet TAKE 1 TABLET BY MOUTH EVERY EVENING 05/08/20   [place] Lance, MD  sodium chloride (OCEAN) 0.65 % SOLN nasal spray Place 1 spray into both nostrils as needed (dry nasal passages).     [provider]    Physical Exam: Vitals:   01/20/21 2130 01/20/21 2200 01/20/21 2230 01/20/21 2345  BP: (!) 179/91 (!) 179/89 (!) 170/82 (!) 154/100  Pulse: 60 63 (!) 57 61  Resp: (!) 25 (!) 23 (!) 25 (!) 24  Temp:      TempSrc:      SpO2: 100% 99% 99% 99%  Weight:      Height:        Constitutional: NAD, calm, comfortable, nontoxic appearing female laying at 40 degree incline in bed with one pillow Vitals:   01/20/21 2130 01/20/21 2200 01/20/21 2230 01/20/21 2345  BP: (!) 179/91 (!) 179/89 (!) 170/82 (!) 154/100  Pulse: 60 63 (!) 57 61  Resp: (!) 25 (!) 23 (!) 25 (!) 24  Temp:      TempSrc:      SpO2: 100% 99% 99% 99%  Weight:      Height:       Eyes: PERRL, lids and conjunctivae normal ENMT: Mucous membranes are moist.  Neck: normal, supple, no masses, no thyromegaly Respiratory: Bibasilar crackles. Normal respiratory effort on 4 L. No accessory muscle use.  Cardiovascular: Regular rate and rhythm, no murmurs / rubs / gallops.  Bilateral pretibial lower extreme edema up to the knees. Abdomen: no tenderness, no masses palpated.  Bowel sounds positive.  Musculoskeletal: no clubbing / cyanosis. No joint deformity upper and lower extremities. Good ROM, no contractures. Normal muscle tone.  Skin: Ecchymosis on the dorsal PIP joint of left great toe. Neurologic: CN 2-12 grossly intact. Sensation intact, Strength 5/5 in all 4.  Psychiatric: Normal judgment and insight. Alert and oriented x 3. Normal mood.     Labs on Admission: I have personally reviewed following labs and imaging studies  CBC: Recent Labs  Lab 01/20/21 2338  WBC 5.4  NEUTROABS 4.4  HGB 10.4*  HCT 31.7*  MCV 100.6*  PLT [id number]   Basic Metabolic Panel: Recent Labs  Lab 01/20/21 2338  NA 132*  K 4.3  CL 99  CO2 24  GLUCOSE 109*  BUN 13  CREATININE 1.10*  CALCIUM 8.9   GFR: Estimated Creatinine Clearance: 45.8 mL/min (A) (by C-G formula based on SCr of 1.1 mg/dL (H)). Liver Function  Tests: No results for input(s): AST, ALT, ALKPHOS, BILITOT, PROT, ALBUMIN in the last 168 hours. No results for input(s): LIPASE, AMYLASE in the last 168 hours. No results for input(s): AMMONIA in the last 168 hours. Coagulation Profile: No results for input(s): INR, PROTIME in the last 168 hours. Cardiac Enzymes: No results for input(s): CKTOTAL, CKMB, CKMBINDEX, TROPONINI in the last 168 hours. BNP (last 3 results)  No results for input(s): PROBNP in the last 8760 hours. HbA1C: No results for input(s): HGBA1C in the last 72 hours. CBG: No results for input(s): GLUCAP in the last 168 hours. Lipid Profile: No results for input(s): CHOL, HDL, LDLCALC, TRIG, CHOLHDL, LDLDIRECT in the last 72 hours. Thyroid Function Tests: No results for input(s): TSH, T4TOTAL, FREET4, T3FREE, THYROIDAB in the last 72 hours. Anemia Panel: No results for input(s): VITAMINB12, FOLATE, FERRITIN, TIBC, IRON, RETICCTPCT in the last 72 hours. Urine analysis:    Component Value Date/Time   COLORURINE AMBER (A) 06/14/2016 1050   APPEARANCEUR CLOUDY (A) 06/14/2016 1050   LABSPEC 1.027 06/14/2016 1050   PHURINE 5.0 06/14/2016 1050   GLUCOSEU NEGATIVE 06/14/2016 1050   HGBUR LARGE (A) 06/14/2016 1050   BILIRUBINUR SMALL (A) 06/14/2016 1050   KETONESUR 15 (A) 06/14/2016 1050   PROTEINUR 30 (A) 06/14/2016 1050   UROBILINOGEN 1.0 05/16/2013 1805   NITRITE NEGATIVE 06/14/2016 1050   LEUKOCYTESUR MODERATE (A) 06/14/2016 1050    Radiological Exams on Admission: DG Chest 2 View  Result Date: 01/20/2021 CLINICAL DATA:  Weakness and shortness of breath on exertion for 2 weeks. Increased edema in the extremities. EXAM: CHEST - 2 VIEW COMPARISON:  04/06/2016 FINDINGS: Postoperative changes in the mediastinum. Cardiac pacemaker. Cardiac enlargement. No vascular congestion, edema, or consolidation is appreciated. Patient rotation does limit examination. No pleural effusions. No pneumothorax. Calcification of the aorta.  IMPRESSION: Cardiac enlargement. No evidence of active pulmonary disease. Electronically Signed   By: Lucienne Capers M.D.   On: 01/20/2021 23:16      Assessment/Plan  Acute on chronic systolic heart failure Patient with symptoms of lower extremity edema and orthopnea Obtain BNP and troponin Chest x-ray shows cardiomegaly with no overt pulmonary edema Not able to see recent echocardiogram obtained outpatient. Pt follows with Dr. Terrence Dupont of cardiology  Last echo in [place] in 04/2013 with EF of 50 to 55%, severe eccentric MR, elevated pulmonary artery pressure of 47m Hg  Obtain echo start IV Lasix '40mg'$   Strict intake and output Daily weights  Acute hypoxic respiratory failure secondary to CHF exacerbation Maintain O2 greater than 92% Treatment as above  Renal insufficiency Creatinine elevated to 1.10 but no prior recent to compare Follow with repeat BMP.  Monitor closely while on diuretics.  Macrocytic anemia No recent baseline to compare Check vitamin B12 and folate  History of atrial fibrillation Continue amiodarone and metoprolol  Hx of VF s/p ICD will get ICD interrogated in ED  Hypothyroidism Check TSH.  Continue levothyroxine  Left great toe Dorsal PIP ecchymosis s/p fall check foot X-ray  DVT prophylaxis:.Lovenox Code Status: Full Family Communication: Plan discussed with patient at bedside  disposition Plan: Home with at least 2 midnight stays  Consults called:  Admission status: inpatient  Level of care: Telemetry  Status is: Inpatient  Remains inpatient appropriate because:Inpatient level of care appropriate due to severity of illness   Dispo: The patient is from: Home              Anticipated d/c is to: Home              Patient currently is not medically stable to d/c.   Difficult to place patient No         COrene DesanctisDO [facility]   If 7PM-7AM, please contact night-coverage [email or web address]   01/21/2021, 12:42 AM

## 2021-01-21 NOTE — Progress Notes (Signed)
  Echocardiogram 2D Echocardiogram has been performed.  Matilde Bash 01/21/2021, 1:45 PM

## 2021-01-21 NOTE — ED Notes (Signed)
ED TO INPATIENT HANDOFF REPORT  ED Nurse Name and Phone #: [phone], Erick Colace, RN   S Name/Age/Gender [name] 75 y.o. female Room/Bed: WA20/WA20  Code Status   Code Status: Full Code  Home/SNF/Other Home Patient oriented to: self, place, time and situation Is this baseline? Yes   Triage Complete: Triage complete  Chief Complaint Acute CHF (congestive heart failure) ([place]) [I50.9]  Triage Note Patient arrives from home via EMS with complaint of weakness and SOB upon exertion x 2 weeks. Hx of CHF, pt reports increased edema in both extremeties. EMS reports O2 sats 89% when walking into the house, 100 % once placed on 3 L O2.   18 g LAC  EMS vitals BP 166/96 HR 62 RR 20 CBG 128     Allergies Allergies  Allergen Reactions  . Morphine And Related Itching and Nausea And Vomiting  . Codeine Nausea And Vomiting  . Demerol [Meperidine Hcl] Nausea And Vomiting  . Meperidine Hcl Nausea And Vomiting  . Pollen Extract Itching and Other (See Comments)    Seasonal allergies    Level of Care/Admitting Diagnosis ED Disposition    ED Disposition Condition Comment   Admit  Hospital Area: [place] [[id number]]  Level of Care: Telemetry [5]  Admit to tele based on following criteria: Acute CHF  May admit patient to [facility] or [facility] if equivalent level of care is available:: No  Covid Evaluation: Asymptomatic Screening Protocol (No Symptoms)  Diagnosis: Acute CHF (congestive heart failure) [facility]) [id number]  Admitting Physician: Orene Desanctis [id number]  Attending Physician: Orene Desanctis [id number]  Estimated length of stay: past midnight tomorrow  Certification:: I certify this patient will need inpatient services for at least 2 midnights       B Medical/Surgery History Past Medical History:  Diagnosis Date  . Anxiety   . Arthritis   . Automatic implantable cardioverter-defibrillator in situ   . Cardiomegaly   . CHF (congestive heart  failure) ([place]) 05/2013   class III, severe  . Chronic bronchitis ([place])   . Depression   . GERD (gastroesophageal reflux disease)   . Headache(784.0)    migraines  . Heart murmur   . History of sudden cardiac arrest successfully resuscitated 2011  . HLD (hyperlipidemia)   . Hypertension   . Hypothyroidism   . IBS (irritable bowel syndrome)   . Paroxysmal atrial fibrillation (H[facility])   . Pneumonia   . Scoliosis   . Severe mitral regurgitation 05/2013   s/p MV repair with annuloplasty   Past Surgical History:  Procedure Laterality Date  . ABDOMINAL HYSTERECTOMY    . APPENDECTOMY    . CARDIAC CATHETERIZATION    . CARDIAC DEFIBRILLATOR PLACEMENT  2010   Vfib arrest, single chamber ICD  . CARPAL TUNNEL RELEASE Right 03/05/2014   Procedure: RIGHT CARPAL TUNNEL RELEASE;  Surgeon: Schuyler Amor, MD;  Location: [place];  Service: Orthopedics;  Laterality: Right;  . CATARACT EXTRACTION W/PHACO Left 09/03/2020   Procedure: CATARACT EXTRACTION PHACO AND INTRAOCULAR LENS PLACEMENT ([place]) LEFT;  Surgeon: Birder Robson, MD;  Location: [facility];  Service: Ophthalmology;  Laterality: Left;  Lot # [id number] [place]: 01:04.1 CDE:11:62   . CATARACT EXTRACTION W/PHACO Right 12/31/2020   Procedure: CATARACT EXTRACTION PHACO AND INTRAOCULAR LENS PLACEMENT ([place]) RIGHT;  Surgeon: Birder Robson, MD;  Location: [facility];  Service: Ophthalmology;  Laterality: Right;  Lot [id number] [place]: 00:55.8 CDE: 6.29  . COLONOSCOPY    . FRACTURE SURGERY    .  HERNIA REPAIR  as child   x 2  . INTRAOPERATIVE TRANSESOPHAGEAL ECHOCARDIOGRAM N/A 05/24/2013   Procedure: INTRAOPERATIVE TRANSESOPHAGEAL ECHOCARDIOGRAM;  Surgeon: Ivin Poot, MD;  Location: [place];  Service: Open Heart Surgery;  Laterality: N/A;  . LEFT HEART CATHETERIZATION WITH CORONARY ANGIOGRAM N/A 05/16/2013   Procedure: LEFT HEART CATHETERIZATION WITH CORONARY ANGIOGRAM;  Surgeon: Clent Demark, MD;  Location: [place] CATH LAB;  Service: Cardiovascular;   Laterality: N/A;  . MITRAL VALVE REPAIR N/A 05/24/2013   Surgeon: Ivin Poot, MD; Service: Open Heart Surgery  . MULTIPLE EXTRACTIONS WITH ALVEOLOPLASTY N/A 05/21/2013   Procedure: Extraction of tooth #'s 2,4,18 with alveoloplasty and gross debridement of remaining teeth;  Surgeon: Lenn Cal, DDS;  Location: [place];  Service: Oral Surgery;  Laterality: N/A;  . OPEN REDUCTION INTERNAL FIXATION (ORIF) DISTAL RADIAL FRACTURE Right 03/05/2014   Procedure: OPEN REDUCTION INTERNAL FIXATION (ORIF) RIGHT DISTAL RADIUS FRACTURE ;  Surgeon: Schuyler Amor, MD;  Location: [place];  Service: Orthopedics;  Laterality: Right;  . TEE WITHOUT CARDIOVERSION  09/24/2012   Procedure: TRANSESOPHAGEAL ECHOCARDIOGRAM (TEE);  Surgeon: Fay Records, MD;  Location: [facility];  Service: Cardiovascular;  Laterality: N/A;     A IV Location/Drains/Wounds Patient Lines/Drains/Airways Status    Active Line/Drains/Airways    Name Placement date Placement time Site Days   Peripheral IV 01/20/21 Left 01/20/21  2100  --  1   Incision (Closed) 03/05/14 Arm Right 03/05/14  1449  -- 2514   Incision (Closed) 09/03/20 Eye Left 09/03/20  0821  -- 140   Incision (Closed) 12/31/20 Eye Right 12/31/20  0900  -- 21          Intake/Output Last 24 hours No intake or output data in the 24 hours ending 01/21/21 0110  Labs/Imaging Results for orders placed or performed during the hospital encounter of 01/20/21 (from the past 48 hour(s))  Basic metabolic panel     Status: Abnormal   Collection Time: 01/20/21 11:38 PM  Result Value Ref Range   Sodium 132 (L) 135 - 145 mmol/L   Potassium 4.3 3.5 - 5.1 mmol/L   Chloride 99 98 - 111 mmol/L   CO2 24 22 - 32 mmol/L   Glucose, Bld 109 (H) 70 - 99 mg/dL    Comment: Glucose reference range applies only to samples taken after fasting for at least 8 hours.   BUN 13 8 - 23 mg/dL   Creatinine, Ser 1.10 (H) 0.44 - 1.00 mg/dL   Calcium 8.9 8.9 - 10.3 mg/dL   GFR, Estimated 53 (L)  >60 mL/min    Comment: (NOTE) Calculated using the CKD-EPI Creatinine Equation (2021)    Anion gap 9 5 - 15    Comment: Performed at [facility], [place] [address], [place], [place] [address]  CBC with Differential     Status: Abnormal   Collection Time: 01/20/21 11:38 PM  Result Value Ref Range   WBC 5.4 4.0 - 10.5 K/uL   RBC 3.15 (L) 3.87 - 5.11 MIL/uL   Hemoglobin 10.4 (L) 12.0 - 15.0 g/dL   HCT 31.7 (L) 36.0 - 46.0 %   MCV 100.6 (H) 80.0 - 100.0 fL   MCH 33.0 26.0 - 34.0 pg   MCHC 32.8 30.0 - 36.0 g/dL   RDW 14.1 11.5 - 15.5 %   Platelets 284 150 - 400 K/uL   nRBC 0.0 0.0 - 0.2 %   Neutrophils Relative % 82 %   Neutro  Abs 4.4 1.7 - 7.7 K/uL   Lymphocytes Relative 11 %   Lymphs Abs 0.6 (L) 0.7 - 4.0 K/uL   Monocytes Relative 7 %   Monocytes Absolute 0.4 0.1 - 1.0 K/uL   Eosinophils Relative 0 %   Eosinophils Absolute 0.0 0.0 - 0.5 K/uL   Basophils Relative 0 %   Basophils Absolute 0.0 0.0 - 0.1 K/uL   Immature Granulocytes 0 %   Abs Immature Granulocytes 0.02 0.00 - 0.07 K/uL    Comment: Performed at [facility], [place] [address] [place], [place] [address]  Brain natriuretic peptide     Status: Abnormal   Collection Time: 01/20/21 11:38 PM  Result Value Ref Range   B Natriuretic Peptide >4,500.0 (H) 0.0 - 100.0 pg/mL    Comment: NO VISIBLE HEMOLYSIS Performed at [facility] [address] [place], [place] [address]   Resp Panel by RT-PCR (Flu A&B, Covid) Nasopharyngeal Swab     Status: None   Collection Time: 01/20/21 11:40 PM   Specimen: Nasopharyngeal Swab; Nasopharyngeal(NP) swabs in vial transport medium  Result Value Ref Range   SARS Coronavirus 2 by RT PCR NEGATIVE NEGATIVE    Comment: (NOTE) SARS-CoV-2 target nucleic acids are NOT DETECTED.  The SARS-CoV-2 RNA is generally detectable in upper respiratory specimens during the acute phase of infection. The lowest concentration of SARS-CoV-2 viral copies  this assay can detect is 138 copies/mL. A negative result does not preclude SARS-Cov-2 infection and should not be used as the sole basis for treatment or other patient management decisions. A negative result may occur with  improper specimen collection/handling, submission of specimen other than nasopharyngeal swab, presence of viral mutation(s) within the areas targeted by this assay, and inadequate number of viral copies(<138 copies/mL). A negative result must be combined with clinical observations, patient history, and epidemiological information. The expected result is Negative.  Fact Sheet for Patients:  EntrepreneurPulse.com.au  Fact Sheet for Healthcare Providers:  IncredibleEmployment.be  This test is no t yet approved or cleared by the [place] FDA and  has been authorized for detection and/or diagnosis of SARS-CoV-2 by FDA under an Emergency Use Authorization (EUA). This EUA will remain  in effect (meaning this test can be used) for the duration of the COVID-19 declaration under Section 564(b)(1) of the Act, 21 U.S.C.section 360bbb-3(b)(1), unless the authorization is terminated  or revoked sooner.       Influenza A by PCR NEGATIVE NEGATIVE   Influenza B by PCR NEGATIVE NEGATIVE    Comment: (NOTE) The Xpert Xpress SARS-CoV-2/FLU/RSV plus assay is intended as an aid in the diagnosis of influenza from Nasopharyngeal swab specimens and should not be used as a sole basis for treatment. Nasal washings and aspirates are unacceptable for Xpert Xpress SARS-CoV-2/FLU/RSV testing.  Fact Sheet for Patients: EntrepreneurPulse.com.au  Fact Sheet for Healthcare Providers: IncredibleEmployment.be  This test is not yet approved or cleared by the [place] FDA and has been authorized for detection and/or diagnosis of SARS-CoV-2 by FDA under an Emergency Use Authorization (EUA). This EUA will  remain in effect (meaning this test can be used) for the duration of the COVID-19 declaration under Section 564(b)(1) of the Act, 21 U.S.C. section 360bbb-3(b)(1), unless the authorization is terminated or revoked.  Performed at [facility], [place] [address] [place], [place] [address]   Troponin I (High Sensitivity)     Status: Abnormal   Collection Time: 01/21/21 12:40 AM  Result Value Ref Range  Troponin I (High Sensitivity) 23 (H) <18 ng/L    Comment: (NOTE) Elevated high sensitivity troponin I (hsTnI) values and significant  changes across serial measurements may suggest ACS but many other  chronic and acute conditions are known to elevate hsTnI results.  Refer to the "Links" section for chest pain algorithms and additional  guidance. Performed at [facility], [place] [address], [place],  [address]    DG Chest 2 View  Result Date: 01/20/2021 CLINICAL DATA:  Weakness and shortness of breath on exertion for 2 weeks. Increased edema in the extremities. EXAM: CHEST - 2 VIEW COMPARISON:  04/06/2016 FINDINGS: Postoperative changes in the mediastinum. Cardiac pacemaker. Cardiac enlargement. No vascular congestion, edema, or consolidation is appreciated. Patient rotation does limit examination. No pleural effusions. No pneumothorax. Calcification of the aorta. IMPRESSION: Cardiac enlargement. No evidence of active pulmonary disease. Electronically Signed   By: Lucienne Capers M.D.   On: 01/20/2021 23:16    Pending Labs Unresulted Labs (From admission, onward)          Start     Ordered   01/21/21 [id number]  Basic metabolic panel  Tomorrow morning,   R        01/21/21 0039   01/21/21 0500  CBC  Tomorrow morning,   R        01/21/21 0039   01/21/21 0500  TSH  Tomorrow morning,   R        01/21/21 0039   01/21/21 0056  Vitamin B12  Once,   STAT        01/21/21 0055   01/21/21 0056  Folate, serum, performed at [facility] lab  Once,   STAT         01/21/21 0055          Vitals/Pain Today's Vitals   01/20/21 2130 01/20/21 2200 01/20/21 2230 01/20/21 2345  BP: (!) 179/91 (!) 179/89 (!) 170/82 (!) 154/100  Pulse: 60 63 (!) 57 61  Resp: (!) 25 (!) 23 (!) 25 (!) 24  Temp:      TempSrc:      SpO2: 100% 99% 99% 99%  Weight:      Height:      PainSc:        Isolation Precautions Airborne and Contact precautions  Medications Medications  enoxaparin (LOVENOX) injection 40 mg (has no administration in time range)  acetaminophen (TYLENOL) tablet 650 mg (has no administration in time range)  furosemide (LASIX) injection 40 mg (has no administration in time range)  amiodarone (PACERONE) tablet 200 mg (has no administration in time range)  sacubitril-valsartan (ENTRESTO) 97-103 mg per tablet (has no administration in time range)  metoprolol succinate (TOPROL-XL) 24 hr tablet 50 mg (has no administration in time range)  pravastatin (PRAVACHOL) tablet 40 mg (has no administration in time range)  levothyroxine (SYNTHROID) tablet 100 mcg (has no administration in time range)  PARoxetine (PAXIL) tablet 60 mg (has no administration in time range)    Mobility walks with person assist High fall risk   Focused Assessments   R Recommendations: See Admitting Provider Note  Report given to:   Additional Notes:

## 2021-01-21 NOTE — Progress Notes (Addendum)
Same day note  Patient seen and examined at bedside.  Patient was admitted to the hospital for lower extremity edema and shortness of breath.  At the time of my evaluation, patient complains of little better with shortness of breath but he still has orthopnea and leg swelling.  Complains of nausea without vomiting.  Patient states that she has not had Lasix at home and was not being prescribed.  Denies AICD firing.  Physical examination reveals female with supplemental oxygen, chest with basal crackles, bilateral lower extremity edema up to the knees.  Vitals with BMI 01/21/2021 01/21/2021 01/21/2021  Height - - -  Weight - - -  BMI - - -  Systolic [id number]  Diastolic 80 76 93  Pulse 63 60 62    Laboratory data and imaging was reviewed  Assessment and Plan.  Acute on chronic systolic heart failure Patient presenting with lower extremity edema orthopnea with BNP more than 4500.  No chest pain.  Troponin borderline at 23.  Patient follows up with Dr. Terrence Dupont cardiology.  Last 2D echocardiogram in 2014 showed a EF of 50 to 55%.  Check 2D echocardiogram.  Patient might have had an echocardiogram with Dr. Terrence Dupont office.  No diuretic listed in the med rec so has been started on IV Lasix '40mg'$  daily.  Continue strict intake and output charting Daily weights.  Will inform Dr. Terrence Dupont for consultation.  Mild elevated troponin no chest pain likely secondary to decompensated heart failure.  Will trend.  Acute hypoxic respiratory failure secondary to CHF exacerbation On 3 L of oxygen by nasal cannula.  Patient was 89% on room air when she presented to hospital.  Continue diuresis.  Wean oxygen as able.  Renal insufficiency Creatinine at 1.1.  No previous creatinine levels to compare.  We will continue to monitor while on diuretics.    Macrocytic anemia Vitamin B12 levels low.  Folate at 16.5.  Will replace with IM vitamin B12 followed by oral vitamin B12..  History of atrial  fibrillation Rate controlled at this time.  Continue amiodarone and metoprolol.  History of ventricular fibrillation s/p ICD Interrogate ICD.  Hypothyroidism   Continue levothyroxine TSH elevated at 20.4.  Was 3.6, five years back.  On amiodarone.  Left great toe Dorsal PIP ecchymosis s/p fall No evidence of fracture.  Soft tissue swelling.  Signed,  Delila Pereyra, MD [facility]  Addendum:  01/21/2021 11:39 AM  Spoke with Dr. Terrence Dupont about the patient.  He will consult the patient today.

## 2021-01-22 DIAGNOSIS — I50811 Acute right heart failure: Secondary | ICD-10-CM

## 2021-01-22 DIAGNOSIS — T1490XA Injury, unspecified, initial encounter: Secondary | ICD-10-CM

## 2021-01-22 LAB — BASIC METABOLIC PANEL
Anion gap: 9 (ref 5–15)
BUN: 16 mg/dL (ref 8–23)
CO2: 30 mmol/L (ref 22–32)
Calcium: 9 mg/dL (ref 8.9–10.3)
Chloride: 93 mmol/L — ABNORMAL LOW (ref 98–111)
Creatinine, Ser: 1.21 mg/dL — ABNORMAL HIGH (ref 0.44–1.00)
GFR, Estimated: 47 mL/min — ABNORMAL LOW (ref >60)
Glucose, Bld: 110 mg/dL — ABNORMAL HIGH (ref 70–99)
Potassium: 4.1 mmol/L (ref 3.5–5.1)
Sodium: 132 mmol/L — ABNORMAL LOW (ref 135–145)

## 2021-01-22 LAB — MAGNESIUM: Magnesium: 2.1 mg/dL (ref 1.7–2.4)

## 2021-01-22 LAB — PHOSPHORUS: Phosphorus: 4.9 mg/dL — ABNORMAL HIGH (ref 2.5–4.6)

## 2021-01-22 NOTE — Plan of Care (Signed)
  Problem: Education: Goal: Knowledge of General Education information will improve Description Including pain rating scale, medication(s)/side effects and non-pharmacologic comfort measures Outcome: Progressing   Problem: Health Behavior/Discharge Planning: Goal: Ability to manage health-related needs will improve Outcome: Progressing   

## 2021-01-22 NOTE — Progress Notes (Signed)
PROGRESS NOTE  Arnold Goldy [id number] DOB: Jul 27, 1946 DOA: 01/20/2021 PCP: Patient, No Pcp Per (Inactive)   LOS: 1 day   Brief narrative:  Fran Heckard is a 75 y.o. female with medical history significant for chronic systolic heart failure, atrial fibrillation on amiodarone, V. fib s/p ICD, hypertension, hyperlipidemia and IBS presented to hospital with concerns of lower extremity edema and shortness of breath for 2 weeks.  Patient was last seen by cardiology in February and had Entresto added to her medication regimen but was not on diuretic.  In the ED patient was mildly tachycardic and hypertensive with mild hyponatremia.  Chest x-ray showed cardiomegaly but no pulmonary edema but patient did have orthopnea and lower extremity edema.  Patient was then admitted hospital for acute decompensated congestive heart failure.    During hospitalization, patient ween seen by cardiology.  Assessment/Plan:  Principal Problem:   Acute CHF (congestive heart failure) (HCC) Active Problems:   Hypothyroidism   Automatic implantable cardioverter-defibrillator in situ   Atrial fibrillation (HCC)   Ventricular fibrillation (HCC)   Macrocytic anemia   Toe injury, left, initial encounter   Acute respiratory failure with hypoxia (HCC)  Acute on chronic systolic heart failure 2D echocardiogram from 01/21/2021 showed LV ejection fraction of 30 to 35% with no regional wall motion abnormality.  Patient presenting with lower extremity edema orthopnea with BNP more than 4500.  No chest pain.  Troponin borderline at 23>26. Currently on IV Lasix '40mg'$  daily.  Continue strict intake and output charting, Daily weights.  Cardiology has seen the patient in consultation yesterday.  Toprol-XL has been changed to Coreg twice daily.  On Low-dose spironolactone,  Jardiance has been added.  Synthroid has been increased to 125 mg daily.  We will continue to follow cardiology recommendation.  Feels little better today.  Mild  elevated troponin no chest pain, likely secondary to decompensated heart failure.    Acute hypoxic respiratory failure secondary to CHF exacerbation On 3 L of oxygen by nasal cannula.  Patient was 89% on room air when she presented to hospital.  Continue IV diuresis.  Wean oxygen as able.  Renal insufficiency Creatinine at 1.1 presentation.  Creatinine of 1.2 today.  No previous creatinine levels to compare.  We will continue to monitor while on diuretics.    Macrocytic anemia Vitamin B12 levels low.  Folate at 16.5.  Received 1 dose of parenteral vitamin B12 followed by oral vitamin B12..  History of atrial fibrillation Rate controlled at this time.  Continue amiodarone and metoprolol.  History of ventricular fibrillation s/p ICD Interrogate ICD.  Spoke with the nursing staff  Hypothyroidism   Continue levothyroxine TSH elevated at 20.4.  Was 3.6, five years back.  On amiodarone.  Synthroid dose has been increased to 125 mcg  Left great toe Dorsal PIP ecchymosis s/p fall No evidence of fracture.  Soft tissue swelling.   DVT prophylaxis: enoxaparin (LOVENOX) injection 40 mg Start: 01/21/21 1000   Code Status: Full code  Family Communication: Tried to reach the patient's daughter Ms. Christina on the phone but was unable to reach her twice.  Status is: Inpatient  Remains inpatient appropriate because:IV treatments appropriate due to intensity of illness or inability to take PO and Inpatient level of care appropriate due to severity of illness   Dispo: The patient is from: Home              Anticipated d/c is to: Home  Patient currently is not medically stable to d/c.   Difficult to place patient No   Consultants:  Cardiology  Procedures:  None  Anti-infectives:  . None  Anti-infectives (From admission, onward)   None      Subjective: Today, patient was seen and examined at bedside.  She feels a little better with breathing.  Denies any  chest pain, palpitation dizziness lightheadedness   Objective: Vitals:   01/21/21 2135 01/22/21 0438  BP: (!) 149/96 (!) 150/82  Pulse: (!) 53 60  Resp:    Temp: 98.1 F (36.7 C) 97.7 F (36.5 C)  SpO2: 91% 98%    Intake/Output Summary (Last 24 hours) at 01/22/2021 0856 Last data filed at 01/22/2021 0556 Gross per 24 hour  Intake 360 ml  Output 750 ml  Net -390 ml   Filed Weights   01/20/21 1937 01/22/21 0438  Weight: 77.1 kg 76.7 kg   Body mass index is 29.04 kg/m.   Physical Exam: GENERAL: Patient is alert awake and oriented. Not in obvious distress.,  Nasal cannula oxygen HENT: No scleral pallor or icterus. Pupils equally reactive to light. Oral mucosa is moist NECK: is supple, no gross swelling noted. CHEST: Diminished breath sounds bilaterally. CVS: S1 and S2 heard, no murmur. Regular rate and rhythm.  ABDOMEN: Soft, non-tender, bowel sounds are present. EXTREMITIES: Bilateral lower extremity edema+_ CNS: Cranial nerves are intact. No focal motor deficits. SKIN: warm and dry without rashes.  Data Review: I have personally reviewed the following laboratory data and studies,  CBC: Recent Labs  Lab 01/20/21 2338 01/21/21 0228  WBC 5.4 5.5  NEUTROABS 4.4  --   HGB 10.4* 10.4*  HCT 31.7* 32.0*  MCV 100.6* 100.9*  PLT 284 [id number]   Basic Metabolic Panel: Recent Labs  Lab 01/20/21 2338 01/21/21 0228 01/22/21 0539  NA 132* 135 132*  K 4.3 4.7 4.1  CL 99 100 93*  CO2 '24 25 30  '$ GLUCOSE 109* 102* 110*  BUN '13 13 16  '$ CREATININE 1.10* 1.14* 1.21*  CALCIUM 8.9 9.2 9.0  MG  --   --  2.1  PHOS  --   --  4.9*   Liver Function Tests: No results for input(s): AST, ALT, ALKPHOS, BILITOT, PROT, ALBUMIN in the last 168 hours. No results for input(s): LIPASE, AMYLASE in the last 168 hours. No results for input(s): AMMONIA in the last 168 hours. Cardiac Enzymes: No results for input(s): CKTOTAL, CKMB, CKMBINDEX, TROPONINI in the last 168 hours. BNP (last 3  results) Recent Labs    01/20/21 2338 01/21/21 1157  BNP >4,500.0* >4,500.0*    ProBNP (last 3 results) No results for input(s): PROBNP in the last 8760 hours.  CBG: No results for input(s): GLUCAP in the last 168 hours. Recent Results (from the past 240 hour(s))  Resp Panel by RT-PCR (Flu A&B, Covid) Nasopharyngeal Swab     Status: None   Collection Time: 01/20/21 11:40 PM   Specimen: Nasopharyngeal Swab; Nasopharyngeal(NP) swabs in vial transport medium  Result Value Ref Range Status   SARS Coronavirus 2 by RT PCR NEGATIVE NEGATIVE Final    Comment: (NOTE) SARS-CoV-2 target nucleic acids are NOT DETECTED.  The SARS-CoV-2 RNA is generally detectable in upper respiratory specimens during the acute phase of infection. The lowest concentration of SARS-CoV-2 viral copies this assay can detect is 138 copies/mL. A negative result does not preclude SARS-Cov-2 infection and should not be used as the sole basis for treatment or other patient management decisions. A  negative result may occur with  improper specimen collection/handling, submission of specimen other than nasopharyngeal swab, presence of viral mutation(s) within the areas targeted by this assay, and inadequate number of viral copies(<138 copies/mL). A negative result must be combined with clinical observations, patient history, and epidemiological information. The expected result is Negative.  Fact Sheet for Patients:  EntrepreneurPulse.com.au  Fact Sheet for Healthcare Providers:  IncredibleEmployment.be  This test is no t yet approved or cleared by the [place] FDA and  has been authorized for detection and/or diagnosis of SARS-CoV-2 by FDA under an Emergency Use Authorization (EUA). This EUA will remain  in effect (meaning this test can be used) for the duration of the COVID-19 declaration under Section 564(b)(1) of the Act, 21 U.S.C.section 360bbb-3(b)(1), unless the  authorization is terminated  or revoked sooner.    specimens during the acute phase of infection. The lowest concentration of SARS-CoV-2 viral copies this assay can detect is 138 copies/mL. A negative result does not preclude SARS-Cov-2 infection and should not be used as the sole basis for treatment or other patient management decisions. A negative result may occur with  improper specimen collection/handling, submission of specimen other than nasopharyngeal swab, presence of viral mutation(s) within the areas targeted by this assay, and inadequate number of viral copies(<138 copies/mL). A negative result must be c ombined with clinical observations, patient history, and epidemiological information. The expected result is Negative.  Fact Sheet for Patients:  EntrepreneurPulse.com.au  Fact Sheet for Healthcare Providers:  IncredibleEmployment.be  This test is not yet approved or cleared by the [place] FDA and  has been authorized for detection and/or diagnosis of SARS-CoV-2 by FDA under an Emergency Use Authorization (EUA). This EUA will remain  in effect (meaning this test can be used) for the duration of the COVID-19 declaration under Section 564(b)(1) of the Act, 21 U.S.C.section 360bbb-3(b)(1), unless the authorization is terminated  or revoked sooner.       Influenza A by PCR NEGATIVE NEGATIVE Final   Influenza B by PCR NEGATIVE NEGATIVE Final    Comment: (NOTE) The Xpert Xpress SARS-CoV-2/FLU/RSV plus assay is intended as an aid in the diagnosis of influenza from Nasopharyngeal swab specimens and should not be used as a sole basis for treatment. Nasal washings and aspirates are unacceptable for Xpert Xpress SARS-CoV-2/FLU/RSV testing.  Fact Sheet for Patients: EntrepreneurPulse.com.au  Fact Sheet for Healthcare Providers: IncredibleEmployment.be  This test is not yet approved or cleared by  the [place] FDA and has been authorized for detection and/or diagnosis of SARS-CoV-2 by FDA under an Emergency Use Authorization (EUA). This EUA will remain in effect (meaning this test can be used) for the duration of the COVID-19 declaration under Section 564(b)(1) of the Act, 21 U.S.C. section 360bbb-3(b)(1), unless the authorization is terminated or revoked.  Performed at [facility], [place] [address] [place], [place] [address]      Studies: DG Chest 2 View  Result Date: 01/20/2021 CLINICAL DATA:  Weakness and shortness of breath on exertion for 2 weeks. Increased edema in the extremities. EXAM: CHEST - 2 VIEW COMPARISON:  04/06/2016 FINDINGS: Postoperative changes in the mediastinum. Cardiac pacemaker. Cardiac enlargement. No vascular congestion, edema, or consolidation is appreciated. Patient rotation does limit examination. No pleural effusions. No pneumothorax. Calcification of the aorta. IMPRESSION: Cardiac enlargement. No evidence of active pulmonary disease. Electronically Signed   By: Lucienne Capers M.D.   On: 01/20/2021 23:16   DG Foot 2 Views Left  Result Date:  01/21/2021 CLINICAL DATA:  Trip and fall injury 2 days ago. Bruising to the posterior surface of the foot and anterior second and third metatarsal area. EXAM: LEFT FOOT - 2 VIEW COMPARISON:  None. FINDINGS: Soft tissue swelling over the dorsum of the foot. No acute fracture or dislocation. Old fracture deformity of the fifth metatarsal shaft. Degenerative changes in the interphalangeal, intertarsal and first metatarsal-phalangeal joints. No focal bone lesion or bone destruction. Vascular calcifications in the soft tissues. IMPRESSION: Soft tissue swelling. No acute bony abnormalities. Degenerative changes. Electronically Signed   By: Lucienne Capers M.D.   On: 01/21/2021 01:20   ECHOCARDIOGRAM COMPLETE  Result Date: 01/21/2021    ECHOCARDIOGRAM REPORT   Patient Name:   AHSHA HARAN Date of Exam:  01/21/2021 Medical Rec #:  [id number]    Height:       64.0 in Accession #:    [id number]   Weight:       170.0 lb Date of Birth:  1945/12/02    BSA:          1.826 m Patient Age:    48 years     BP:           149/76 mmHg Patient Gender: F            HR:           60 bpm. Exam Location:  Inpatient Procedure: 2D Echo, Cardiac Doppler and Color Doppler Indications:    CHF  History:        Patient has prior history of Echocardiogram examinations, most                 recent 05/15/2013. CHF, Defibrillator, Mitral Valve Disease and                 MV repair, Arrythmias:Atrial Fibrillation,                 Signs/Symptoms:Shortness of Breath and LE edema; Risk                 Factors:Hypertension and Dyslipidemia.  Sonographer:    Dustin Flock Referring Phys: [id number] [place]  1. Septal / lateral hypokinesis inferior akinesis . Left ventricular ejection fraction, by estimation, is 30 to 35%. The left ventricle has moderately decreased function. The left ventricle has no regional wall motion abnormalities. The left ventricular  internal cavity size was moderately dilated. There is mild left ventricular hypertrophy. Left ventricular diastolic parameters are indeterminate.  2. AICD wires noted . Right ventricular systolic function is normal. The right ventricular size is normal. There is moderately elevated pulmonary artery systolic pressure.  3. Left atrial size was moderately dilated.  4. Right atrial size was mildly dilated.  5. Post repair trivial residual MR MVA PT1/2 3.14 and mean gradient at HR 59 bpm only 3 mmHg . The mitral valve has been repaired/replaced. Trivial mitral valve regurgitation. No evidence of mitral stenosis.  6. The aortic valve is tricuspid. There is mild calcification of the aortic valve. Aortic valve regurgitation is not visualized. Mild aortic valve sclerosis is present, with no evidence of aortic valve stenosis.  7. The inferior vena cava is dilated in size with >50% respiratory  variability, suggesting right atrial pressure of 8 mmHg. FINDINGS  Left Ventricle: Septal / lateral hypokinesis inferior akinesis. Left ventricular ejection fraction, by estimation, is 30 to 35%. The left ventricle has moderately decreased function. The left ventricle has no regional wall motion abnormalities. The left  ventricular internal cavity size was moderately dilated. There is mild left ventricular hypertrophy. Left ventricular diastolic parameters are indeterminate. Right Ventricle: AICD wires noted. The right ventricular size is normal. No increase in right ventricular wall thickness. Right ventricular systolic function is normal. There is moderately elevated pulmonary artery systolic pressure. The tricuspid regurgitant velocity is 3.43 m/s, and with an assumed right atrial pressure of 3 mmHg, the estimated right ventricular systolic pressure is [id number] mmHg. Left Atrium: Left atrial size was moderately dilated. Right Atrium: Right atrial size was mildly dilated. Pericardium: There is no evidence of pericardial effusion. Mitral Valve: Post repair trivial residual MR MVA PT1/2 3.14 and mean gradient at HR 59 bpm only 3 mmHg. The mitral valve has been repaired/replaced. Trivial mitral valve regurgitation. No evidence of mitral valve stenosis. MV peak gradient, 10.8 mmHg. The mean mitral valve gradient is 3.0 mmHg. Tricuspid Valve: The tricuspid valve is normal in structure. Tricuspid valve regurgitation is mild . No evidence of tricuspid stenosis. Aortic Valve: The aortic valve is tricuspid. There is mild calcification of the aortic valve. Aortic valve regurgitation is not visualized. Mild aortic valve sclerosis is present, with no evidence of aortic valve stenosis. Pulmonic Valve: The pulmonic valve was normal in structure. Pulmonic valve regurgitation is not visualized. No evidence of pulmonic stenosis. Aorta: The aortic root is normal in size and structure. Venous: The inferior vena cava is dilated in size  with greater than 50% respiratory variability, suggesting right atrial pressure of 8 mmHg. IAS/Shunts: No atrial level shunt detected by color flow Doppler.  LEFT VENTRICLE PLAX 2D LVIDd:         5.30 cm      Diastology LVIDs:         5.00 cm      LV e' medial:    4.68 cm/s LV PW:         1.20 cm      LV E/e' medial:  32.1 LV IVS:        1.20 cm      LV e' lateral:   10.00 cm/s LVOT diam:     2.70 cm      LV E/e' lateral: 15.0 LV SV:         116 LV SV Index:   63 LVOT Area:     5.73 cm  LV Volumes (MOD) LV vol d, MOD A4C: 137.0 ml LV vol s, MOD A4C: 115.0 ml LV SV MOD A4C:     137.0 ml RIGHT VENTRICLE RV Basal diam:  3.50 cm RV S prime:     6.53 cm/s TAPSE (M-mode): 1.9 cm LEFT ATRIUM              Index       RIGHT ATRIUM           Index LA diam:        5.10 cm  2.79 cm/m  RA Area:     19.60 cm LA Vol (A2C):   120.0 ml 65.72 ml/m RA Volume:   60.00 ml  32.86 ml/m LA Vol (A4C):   82.1 ml  44.97 ml/m LA Biplane Vol: 101.0 ml 55.32 ml/m  AORTIC VALVE LVOT Vmax:   87.10 cm/s LVOT Vmean:  70.400 cm/s LVOT VTI:    0.202 m  AORTA Ao Root diam: 2.90 cm MITRAL VALVE                TRICUSPID VALVE MV Area (PHT): 3.53 cm     TR Peak  grad:   47.1 mmHg MV Area VTI:   2.64 cm     TR Vmax:        343.00 cm/s MV Peak grad:  10.8 mmHg MV Mean grad:  3.0 mmHg     SHUNTS MV Vmax:       1.64 m/s     Systemic VTI:  0.20 m MV Vmean:      81.0 cm/s    Systemic Diam: 2.70 cm MV Decel Time: 215 msec MV E velocity: 150.00 cm/s MV A velocity: 53.90 cm/s MV E/A ratio:  2.78 Jenkins Rouge MD Electronically signed by Jenkins Rouge MD Signature Date/Time: 01/21/2021/2:08:58 PM    Final       Flora Lipps, MD  [facility] 01/22/2021  If 7PM-7AM, please contact night-coverage

## 2021-01-22 NOTE — Progress Notes (Signed)
Subjective:  Doing well denies any chest pain or shortness of breath.  Laying flat in the bed without problems  Objective:  Vital Signs in the last 24 hours: Temp:  [97.7 F (36.5 C)-98.1 F (36.7 C)] 97.7 F (36.5 C) (04/08 0438) Pulse Rate:  [53-60] 60 (04/08 0438) Resp:  [18] 18 (04/07 1346) BP: (149-150)/(76-96) 150/82 (04/08 0438) SpO2:  [91 %-98 %] 97 % (04/08 1147) Weight:  [76.7 kg] 76.7 kg (04/08 0438)  Intake/Output from previous day: 04/07 0701 - 04/08 0700 In: 360 [P.O.:360] Out: 750 [Urine:750] Intake/Output from this shift: Total I/O In: -  Out: 900 [Urine:900]  Physical Exam: Neck: no adenopathy, no carotid bruit, no JVD and supple, symmetrical, trachea midline Lungs: clear to auscultation bilaterally Heart: regular rate and rhythm, S1, S2 normal and 2/6 systolic murmur noted. Abdomen: soft, non-tender; bowel sounds normal; no masses,  no organomegaly Extremities: extremities normal, atraumatic, no cyanosis or edema  Lab Results: Recent Labs    01/20/21 2338 01/21/21 0228  WBC 5.4 5.5  HGB 10.4* 10.4*  PLT 284 285   Recent Labs    01/21/21 0228 01/22/21 0539  NA 135 132*  K 4.7 4.1  CL 100 93*  CO2 25 30  GLUCOSE 102* 110*  BUN 13 16  CREATININE 1.14* 1.21*   No results for input(s): TROPONINI in the last 72 hours.  Invalid input(s): CK, MB Hepatic Function Panel No results for input(s): PROT, ALBUMIN, AST, ALT, ALKPHOS, BILITOT, BILIDIR, IBILI in the last 72 hours. No results for input(s): CHOL in the last 72 hours. No results for input(s): PROTIME in the last 72 hours.  Imaging: Imaging results have been reviewed and DG Chest 2 View  Result Date: 01/20/2021 CLINICAL DATA:  Weakness and shortness of breath on exertion for 2 weeks. Increased edema in the extremities. EXAM: CHEST - 2 VIEW COMPARISON:  04/06/2016 FINDINGS: Postoperative changes in the mediastinum. Cardiac pacemaker. Cardiac enlargement. No vascular congestion, edema, or  consolidation is appreciated. Patient rotation does limit examination. No pleural effusions. No pneumothorax. Calcification of the aorta. IMPRESSION: Cardiac enlargement. No evidence of active pulmonary disease. Electronically Signed   By: Lucienne Capers M.D.   On: 01/20/2021 23:16   DG Foot 2 Views Left  Result Date: 01/21/2021 CLINICAL DATA:  Trip and fall injury 2 days ago. Bruising to the posterior surface of the foot and anterior second and third metatarsal area. EXAM: LEFT FOOT - 2 VIEW COMPARISON:  None. FINDINGS: Soft tissue swelling over the dorsum of the foot. No acute fracture or dislocation. Old fracture deformity of the fifth metatarsal shaft. Degenerative changes in the interphalangeal, intertarsal and first metatarsal-phalangeal joints. No focal bone lesion or bone destruction. Vascular calcifications in the soft tissues. IMPRESSION: Soft tissue swelling. No acute bony abnormalities. Degenerative changes. Electronically Signed   By: Lucienne Capers M.D.   On: 01/21/2021 01:20   ECHOCARDIOGRAM COMPLETE  Result Date: 01/21/2021    ECHOCARDIOGRAM REPORT   Patient Name:   [name] Date of Exam: 01/21/2021 Medical Rec #:  [id number]    Height:       64.0 in Accession #:    [id number]   Weight:       170.0 lb Date of Birth:  07-23-1946    BSA:          1.826 m Patient Age:    75 years     BP:           149/76 mmHg Patient Gender: F  HR:           60 bpm. Exam Location:  Inpatient Procedure: 2D Echo, Cardiac Doppler and Color Doppler Indications:    CHF  History:        Patient has prior history of Echocardiogram examinations, most                 recent 05/15/2013. CHF, Defibrillator, Mitral Valve Disease and                 MV repair, Arrythmias:Atrial Fibrillation,                 Signs/Symptoms:Shortness of Breath and LE edema; Risk                 Factors:Hypertension and Dyslipidemia.  Sonographer:    Dustin Flock Referring Phys: [id number] [place]  1. Septal /  lateral hypokinesis inferior akinesis . Left ventricular ejection fraction, by estimation, is 30 to 35%. The left ventricle has moderately decreased function. The left ventricle has no regional wall motion abnormalities. The left ventricular  internal cavity size was moderately dilated. There is mild left ventricular hypertrophy. Left ventricular diastolic parameters are indeterminate.  2. AICD wires noted . Right ventricular systolic function is normal. The right ventricular size is normal. There is moderately elevated pulmonary artery systolic pressure.  3. Left atrial size was moderately dilated.  4. Right atrial size was mildly dilated.  5. Post repair trivial residual MR MVA PT1/2 3.14 and mean gradient at HR 59 bpm only 3 mmHg . The mitral valve has been repaired/replaced. Trivial mitral valve regurgitation. No evidence of mitral stenosis.  6. The aortic valve is tricuspid. There is mild calcification of the aortic valve. Aortic valve regurgitation is not visualized. Mild aortic valve sclerosis is present, with no evidence of aortic valve stenosis.  7. The inferior vena cava is dilated in size with >50% respiratory variability, suggesting right atrial pressure of 8 mmHg. FINDINGS  Left Ventricle: Septal / lateral hypokinesis inferior akinesis. Left ventricular ejection fraction, by estimation, is 30 to 35%. The left ventricle has moderately decreased function. The left ventricle has no regional wall motion abnormalities. The left  ventricular internal cavity size was moderately dilated. There is mild left ventricular hypertrophy. Left ventricular diastolic parameters are indeterminate. Right Ventricle: AICD wires noted. The right ventricular size is normal. No increase in right ventricular wall thickness. Right ventricular systolic function is normal. There is moderately elevated pulmonary artery systolic pressure. The tricuspid regurgitant velocity is 3.43 m/s, and with an assumed right atrial pressure of 3  mmHg, the estimated right ventricular systolic pressure is [id number] mmHg. Left Atrium: Left atrial size was moderately dilated. Right Atrium: Right atrial size was mildly dilated. Pericardium: There is no evidence of pericardial effusion. Mitral Valve: Post repair trivial residual MR MVA PT1/2 3.14 and mean gradient at HR 59 bpm only 3 mmHg. The mitral valve has been repaired/replaced. Trivial mitral valve regurgitation. No evidence of mitral valve stenosis. MV peak gradient, 10.8 mmHg. The mean mitral valve gradient is 3.0 mmHg. Tricuspid Valve: The tricuspid valve is normal in structure. Tricuspid valve regurgitation is mild . No evidence of tricuspid stenosis. Aortic Valve: The aortic valve is tricuspid. There is mild calcification of the aortic valve. Aortic valve regurgitation is not visualized. Mild aortic valve sclerosis is present, with no evidence of aortic valve stenosis. Pulmonic Valve: The pulmonic valve was normal in structure. Pulmonic valve regurgitation is not visualized. No evidence of  pulmonic stenosis. Aorta: The aortic root is normal in size and structure. Venous: The inferior vena cava is dilated in size with greater than 50% respiratory variability, suggesting right atrial pressure of 8 mmHg. IAS/Shunts: No atrial level shunt detected by color flow Doppler.  LEFT VENTRICLE PLAX 2D LVIDd:         5.30 cm      Diastology LVIDs:         5.00 cm      LV e' medial:    4.68 cm/s LV PW:         1.20 cm      LV E/e' medial:  32.1 LV IVS:        1.20 cm      LV e' lateral:   10.00 cm/s LVOT diam:     2.70 cm      LV E/e' lateral: 15.0 LV SV:         116 LV SV Index:   63 LVOT Area:     5.73 cm  LV Volumes (MOD) LV vol d, MOD A4C: 137.0 ml LV vol s, MOD A4C: 115.0 ml LV SV MOD A4C:     137.0 ml RIGHT VENTRICLE RV Basal diam:  3.50 cm RV S prime:     6.53 cm/s TAPSE (M-mode): 1.9 cm LEFT ATRIUM              Index       RIGHT ATRIUM           Index LA diam:        5.10 cm  2.79 cm/m  RA Area:     19.60 cm  LA Vol (A2C):   120.0 ml 65.72 ml/m RA Volume:   60.00 ml  32.86 ml/m LA Vol (A4C):   82.1 ml  44.97 ml/m LA Biplane Vol: 101.0 ml 55.32 ml/m  AORTIC VALVE LVOT Vmax:   87.10 cm/s LVOT Vmean:  70.400 cm/s LVOT VTI:    0.202 m  AORTA Ao Root diam: 2.90 cm MITRAL VALVE                TRICUSPID VALVE MV Area (PHT): 3.53 cm     TR Peak grad:   47.1 mmHg MV Area VTI:   2.64 cm     TR Vmax:        343.00 cm/s MV Peak grad:  10.8 mmHg MV Mean grad:  3.0 mmHg     SHUNTS MV Vmax:       1.64 m/s     Systemic VTI:  0.20 m MV Vmean:      81.0 cm/s    Systemic Diam: 2.70 cm MV Decel Time: 215 msec MV E velocity: 150.00 cm/s MV A velocity: 53.90 cm/s MV E/A ratio:  2.78 Jenkins Rouge MD Electronically signed by Jenkins Rouge MD Signature Date/Time: 01/21/2021/2:08:58 PM    Final     Cardiac Studies:  Assessment/Plan:  Compensated systolic congestive heart failure Nonischemic dilated cardiomyopathy History of cardiac arrest status post ICD in the past History of severe mitral regurgitation status post MVR Hypertension Hyperlipidemia Hypothyroidism History of paroxysmal atrial fibrillation on chronic anticoagulation GERD Plan Okay to discharge home from cardiac point of view Will uptitrate beta-blockers as outpatient Switch Lasix IV to p.o. 40 mg daily I will sign off, please call if needed Follow-up with me in 1 week Heart failure instructions  LOS: 1 day    Charolette Forward 01/22/2021, 11:59 AM

## 2021-01-23 LAB — CBC
HCT: 32.4 % — ABNORMAL LOW (ref 36.0–46.0)
Hemoglobin: 10.6 g/dL — ABNORMAL LOW (ref 12.0–15.0)
MCH: 33.4 pg (ref 26.0–34.0)
MCHC: 32.7 g/dL (ref 30.0–36.0)
MCV: 102.2 fL — ABNORMAL HIGH (ref 80.0–100.0)
Platelets: 255 10*3/uL (ref 150–400)
RBC: 3.17 MIL/uL — ABNORMAL LOW (ref 3.87–5.11)
RDW: 14 % (ref 11.5–15.5)
WBC: 4.7 10*3/uL (ref 4.0–10.5)
nRBC: 0 % (ref 0.0–0.2)

## 2021-01-23 LAB — PHOSPHORUS: Phosphorus: 5.4 mg/dL — ABNORMAL HIGH (ref 2.5–4.6)

## 2021-01-23 LAB — MAGNESIUM: Magnesium: 2.3 mg/dL (ref 1.7–2.4)

## 2021-01-23 MED ORDER — ADULT MULTIVITAMIN W/MINERALS CH
1.0000 | ORAL_TABLET | Freq: Every day | ORAL | Status: DC
  Administered 2021-01-23 – 2021-01-24 (×2): 1 via ORAL
  Filled 2021-01-23 (×2): qty 1

## 2021-01-23 MED ORDER — GABAPENTIN 100 MG PO CAPS
100.0000 mg | ORAL_CAPSULE | Freq: Two times a day (BID) | ORAL | Status: DC
  Administered 2021-01-23 – 2021-01-24 (×3): 100 mg via ORAL
  Filled 2021-01-23 (×3): qty 1

## 2021-01-23 NOTE — Progress Notes (Addendum)
PROGRESS NOTE  [name] [id number] DOB: Mar 06, 1946 DOA: 01/20/2021 PCP: Patient, No Pcp Per (Inactive)   LOS: 2 days   Brief narrative:  [name] is a 75 y.o. female with medical history significant for chronic systolic heart failure, atrial fibrillation on amiodarone, V. fib s/p ICD, hypertension, hyperlipidemia and IBS presented to hospital with concerns of lower extremity edema and shortness of breath for 2 weeks.  Patient was last seen by cardiology in February and had Entresto added to her medication regimen but was not on diuretic.  In the ED patient was mildly tachycardic and hypertensive with mild hyponatremia.  Chest x-ray showed cardiomegaly but no pulmonary edema but patient did have orthopnea and lower extremity edema.  Patient was then admitted hospital for acute decompensated congestive heart failure.    During hospitalization, patient ween seen by cardiology.  Patient was continued on diuretics.  Medications were adjusted.  Assessment/Plan:  Principal Problem:   Acute CHF (congestive heart failure) (HCC) Active Problems:   Hypothyroidism   Automatic implantable cardioverter-defibrillator in situ   Atrial fibrillation (HCC)   Ventricular fibrillation (HCC)   Macrocytic anemia   Toe injury, left, initial encounter   Acute respiratory failure with hypoxia (HCC)  Acute on chronic systolic heart failure Improving.  2D echocardiogram from 01/21/2021 showed LV ejection fraction of 30 to 35% with no regional wall motion abnormality.  Patient presenting with lower extremity edema orthopnea with BNP more than 4500.  No chest pain.  Troponin borderline at 23>26.  Continue Lasix IV daily.  Continue strict intake and output charting, Daily weights.  Cardiology has seen the patient and Toprol-XL has been changed to Coreg twice daily.  Patient has been started on Low-dose spironolactone,  Jardiance has been added.  Synthroid has been increased to 125 mg daily.  Cardiology has  signed off at this time.  Patient continues to feel little bit better.  Mild elevated troponin no chest pain, elevated troponin likely secondary to decompensated heart failure.    Acute hypoxic respiratory failure secondary to CHF exacerbation On 3 L of oxygen by nasal cannula.  Patient was 89% on room air when she presented to hospital.  We will continue to wean oxygen as able.  Renal insufficiency Creatinine at 1.1 presentation.  Continue to monitor creatinine on IV diuretics.  Macrocytic anemia Vitamin B12 levels low.  Folate at 16.5.  Received 1 dose of parenteral vitamin B12 followed by oral vitamin B12.  Patient will need vitamin B12 on discharge.  Falls, leg pain, neuropathy.  Could be secondary to vitamin B12 deficiency.  Will need to continue vitamin B12 orally on discharge.  Patient is very insecure about going home today.  Will get PT  evaluation.  We will add gabapentin 100 mg twice daily low-dose to start with today.  History of atrial fibrillation Rate controlled .  Continue amiodarone and Coreg  History of ventricular fibrillation s/p ICD AICD was interrogated with no arrhythmias.    Hypothyroidism  Continue levothyroxine. TSH elevated at 20.4.  Was 3.6, five years back.  On amiodarone.  Synthroid dose has been increased to 125 mcg per cardiology.  Will need to check TSH in 4 to 6 weeks.  Left great toe Dorsal PIP ecchymosis s/p fall No evidence of fracture.  Soft tissue swelling present.  Supportive care.  DVT prophylaxis: enoxaparin (LOVENOX) injection 40 mg Start: 01/21/21 1000   Code Status: Full code  Family Communication:   I again tied to reach the patient's daughter Ms.  Christina on the phone but was unable to reach her.  I was able to talk to the patient's son Mr. Robert and updated him about the clinical condition of the patient.  Status is: Inpatient  Remains inpatient appropriate because:IV treatments appropriate due to intensity of illness or  inability to take PO and Inpatient level of care appropriate due to severity of illness, IV diuresis.   Dispo: The patient is from: Home              Anticipated d/c is to: Home likely tomorrow.  Will get ED evaluation, ambulation, wean oxygen as able.              Patient currently is not medically stable to d/c.   Difficult to place patient No   Consultants:  Cardiology  Procedures:  None  Anti-infectives:  . None  Anti-infectives (From admission, onward)   None     Subjective: Today, patient was seen and examined at bedside.  Complains of some tingling numbness and pain over her bilateral lower extremities.  Denies increasing shortness of breath.  Feels fatigued and weak.  Feels a little insecure about going home today.  Objective: Vitals:   01/22/21 2117 01/23/21 0452  BP: 132/61 (!) 131/58  Pulse: (!) 55 (!) 55  Resp: 17 17  Temp: 97.8 F (36.6 C) 98 F (36.7 C)  SpO2: 97% 98%    Intake/Output Summary (Last 24 hours) at 01/23/2021 1335 Last data filed at 01/23/2021 0235 Gross per 24 hour  Intake 223 ml  Output 2025 ml  Net -1802 ml   Filed Weights   01/20/21 1937 01/22/21 0438 01/23/21 0415  Weight: 77.1 kg 76.7 kg (!) 165 kg   Body mass index is 62.44 kg/m.   Physical Exam: General: Obese built,, not in obvious distress, on nasal oxygen, hard of hearing HENT:   No scleral pallor or icterus noted. Oral mucosa is moist.  Chest:    Diminished breath sounds bilaterally.   CVS: S1 &S2 heard. No murmur.  Regular rate and rhythm. Abdomen: Soft, nontender, nondistended.  Bowel sounds are heard.   Extremities: No cyanosis, clubbing but bilateral lower extremity edema mild noted..  Peripheral pulses are palpable. Psych: Alert, awake and oriented, normal mood CNS:  No cranial nerve deficits.  Power equal in all extremities.   Skin: Warm and dry.  No rashes noted.   Data Review: I have personally reviewed the following laboratory data and  studies,  CBC: Recent Labs  Lab 01/20/21 2338 01/21/21 0228 01/23/21 0445  WBC 5.4 5.5 4.7  NEUTROABS 4.4  --   --   HGB 10.4* 10.4* 10.6*  HCT 31.7* 32.0* 32.4*  MCV 100.6* 100.9* 102.2*  PLT 284 285 [id number]   Basic Metabolic Panel: Recent Labs  Lab 01/20/21 2338 01/21/21 0228 01/22/21 0539 01/23/21 0445  NA 132* 135 132*  --   K 4.3 4.7 4.1  --   CL 99 100 93*  --   CO2 '24 25 30  '$ --   GLUCOSE 109* 102* 110*  --   BUN '13 13 16  '$ --   CREATININE 1.10* 1.14* 1.21*  --   CALCIUM 8.9 9.2 9.0  --   MG  --   --  2.1 2.3  PHOS  --   --  4.9* 5.4*   Liver Function Tests: No results for input(s): AST, ALT, ALKPHOS, BILITOT, PROT, ALBUMIN in the last 168 hours. No results for input(s): LIPASE, AMYLASE in the last  168 hours. No results for input(s): AMMONIA in the last 168 hours. Cardiac Enzymes: No results for input(s): CKTOTAL, CKMB, CKMBINDEX, TROPONINI in the last 168 hours. BNP (last 3 results) Recent Labs    01/20/21 2338 01/21/21 1157  BNP >4,500.0* >4,500.0*    ProBNP (last 3 results) No results for input(s): PROBNP in the last 8760 hours.  CBG: No results for input(s): GLUCAP in the last 168 hours. Recent Results (from the past 240 hour(s))  Resp Panel by RT-PCR (Flu A&B, Covid) Nasopharyngeal Swab     Status: None   Collection Time: 01/20/21 11:40 PM   Specimen: Nasopharyngeal Swab; Nasopharyngeal(NP) swabs in vial transport medium  Result Value Ref Range Status   SARS Coronavirus 2 by RT PCR NEGATIVE NEGATIVE Final    Comment: (NOTE) SARS-CoV-2 target nucleic acids are NOT DETECTED.  The SARS-CoV-2 RNA is generally detectable in upper respiratory specimens during the acute phase of infection. The lowest concentration of SARS-CoV-2 viral copies this assay can detect is 138 copies/mL. A negative result does not preclude SARS-Cov-2 infection and should not be used as the sole basis for treatment or other patient management decisions. A negative result may  occur with  improper specimen collection/handling, submission of specimen other than nasopharyngeal swab, presence of viral mutation(s) within the areas targeted by this assay, and inadequate number of viral copies(<138 copies/mL). A negative result must be combined with clinical observations, patient history, and epidemiological information. The expected result is Negative.  Fact Sheet for Patients:  EntrepreneurPulse.com.au  Fact Sheet for Healthcare Providers:  IncredibleEmployment.be  This test is no t yet approved or cleared by the [place] FDA and  has been authorized for detection and/or diagnosis of SARS-CoV-2 by FDA under an Emergency Use Authorization (EUA). This EUA will remain  in effect (meaning this test can be used) for the duration of the COVID-19 declaration under Section 564(b)(1) of the Act, 21 U.S.C.section 360bbb-3(b)(1), unless the authorization is terminated  or revoked sooner.    specimens during the acute phase of infection. The lowest concentration of SARS-CoV-2 viral copies this assay can detect is 138 copies/mL. A negative result does not preclude SARS-Cov-2 infection and should not be used as the sole basis for treatment or other patient management decisions. A negative result may occur with  improper specimen collection/handling, submission of specimen other than nasopharyngeal swab, presence of viral mutation(s) within the areas targeted by this assay, and inadequate number of viral copies(<138 copies/mL). A negative result must be c ombined with clinical observations, patient history, and epidemiological information. The expected result is Negative.  Fact Sheet for Patients:  EntrepreneurPulse.com.au  Fact Sheet for Healthcare Providers:  IncredibleEmployment.be  This test is not yet approved or cleared by the [place] FDA and  has been authorized for detection  and/or diagnosis of SARS-CoV-2 by FDA under an Emergency Use Authorization (EUA). This EUA will remain  in effect (meaning this test can be used) for the duration of the COVID-19 declaration under Section 564(b)(1) of the Act, 21 U.S.C.section 360bbb-3(b)(1), unless the authorization is terminated  or revoked sooner.       Influenza A by PCR NEGATIVE NEGATIVE Final   Influenza B by PCR NEGATIVE NEGATIVE Final    Comment: (NOTE) The Xpert Xpress SARS-CoV-2/FLU/RSV plus assay is intended as an aid in the diagnosis of influenza from Nasopharyngeal swab specimens and should not be used as a sole basis for treatment. Nasal washings and aspirates are unacceptable for Xpert Xpress SARS-CoV-2/FLU/RSV testing.  Fact Sheet for Patients: EntrepreneurPulse.com.au  Fact Sheet for Healthcare Providers: IncredibleEmployment.be  This test is not yet approved or cleared by the [place] FDA and has been authorized for detection and/or diagnosis of SARS-CoV-2 by FDA under an Emergency Use Authorization (EUA). This EUA will remain in effect (meaning this test can be used) for the duration of the COVID-19 declaration under Section 564(b)(1) of the Act, 21 U.S.C. section 360bbb-3(b)(1), unless the authorization is terminated or revoked.  Performed at [facility], [place] [address] [place], [place] [address]      Studies: ECHOCARDIOGRAM COMPLETE  Result Date: 01/21/2021    ECHOCARDIOGRAM REPORT   Patient Name:   NIKKITA ESHLEMAN Date of Exam: 01/21/2021 Medical Rec #:  [id number]    Height:       64.0 in Accession #:    [id number]   Weight:       170.0 lb Date of Birth:  03/06/1946    BSA:          1.826 m Patient Age:    29 years     BP:           149/76 mmHg Patient Gender: F            HR:           60 bpm. Exam Location:  Inpatient Procedure: 2D Echo, Cardiac Doppler and Color Doppler Indications:    CHF  History:        Patient has prior history  of Echocardiogram examinations, most                 recent 05/15/2013. CHF, Defibrillator, Mitral Valve Disease and                 MV repair, Arrythmias:Atrial Fibrillation,                 Signs/Symptoms:Shortness of Breath and LE edema; Risk                 Factors:Hypertension and Dyslipidemia.  Sonographer:    Dustin Flock Referring Phys: [id number] [place]  1. Septal / lateral hypokinesis inferior akinesis . Left ventricular ejection fraction, by estimation, is 30 to 35%. The left ventricle has moderately decreased function. The left ventricle has no regional wall motion abnormalities. The left ventricular  internal cavity size was moderately dilated. There is mild left ventricular hypertrophy. Left ventricular diastolic parameters are indeterminate.  2. AICD wires noted . Right ventricular systolic function is normal. The right ventricular size is normal. There is moderately elevated pulmonary artery systolic pressure.  3. Left atrial size was moderately dilated.  4. Right atrial size was mildly dilated.  5. Post repair trivial residual MR MVA PT1/2 3.14 and mean gradient at HR 59 bpm only 3 mmHg . The mitral valve has been repaired/replaced. Trivial mitral valve regurgitation. No evidence of mitral stenosis.  6. The aortic valve is tricuspid. There is mild calcification of the aortic valve. Aortic valve regurgitation is not visualized. Mild aortic valve sclerosis is present, with no evidence of aortic valve stenosis.  7. The inferior vena cava is dilated in size with >50% respiratory variability, suggesting right atrial pressure of 8 mmHg. FINDINGS  Left Ventricle: Septal / lateral hypokinesis inferior akinesis. Left ventricular ejection fraction, by estimation, is 30 to 35%. The left ventricle has moderately decreased function. The left ventricle has no regional wall motion abnormalities. The left  ventricular internal cavity size was moderately dilated. There  is mild left ventricular  hypertrophy. Left ventricular diastolic parameters are indeterminate. Right Ventricle: AICD wires noted. The right ventricular size is normal. No increase in right ventricular wall thickness. Right ventricular systolic function is normal. There is moderately elevated pulmonary artery systolic pressure. The tricuspid regurgitant velocity is 3.43 m/s, and with an assumed right atrial pressure of 3 mmHg, the estimated right ventricular systolic pressure is [id number] mmHg. Left Atrium: Left atrial size was moderately dilated. Right Atrium: Right atrial size was mildly dilated. Pericardium: There is no evidence of pericardial effusion. Mitral Valve: Post repair trivial residual MR MVA PT1/2 3.14 and mean gradient at HR 59 bpm only 3 mmHg. The mitral valve has been repaired/replaced. Trivial mitral valve regurgitation. No evidence of mitral valve stenosis. MV peak gradient, 10.8 mmHg. The mean mitral valve gradient is 3.0 mmHg. Tricuspid Valve: The tricuspid valve is normal in structure. Tricuspid valve regurgitation is mild . No evidence of tricuspid stenosis. Aortic Valve: The aortic valve is tricuspid. There is mild calcification of the aortic valve. Aortic valve regurgitation is not visualized. Mild aortic valve sclerosis is present, with no evidence of aortic valve stenosis. Pulmonic Valve: The pulmonic valve was normal in structure. Pulmonic valve regurgitation is not visualized. No evidence of pulmonic stenosis. Aorta: The aortic root is normal in size and structure. Venous: The inferior vena cava is dilated in size with greater than 50% respiratory variability, suggesting right atrial pressure of 8 mmHg. IAS/Shunts: No atrial level shunt detected by color flow Doppler.  LEFT VENTRICLE PLAX 2D LVIDd:         5.30 cm      Diastology LVIDs:         5.00 cm      LV e' medial:    4.68 cm/s LV PW:         1.20 cm      LV E/e' medial:  32.1 LV IVS:        1.20 cm      LV e' lateral:   10.00 cm/s LVOT diam:     2.70 cm      LV  E/e' lateral: 15.0 LV SV:         116 LV SV Index:   63 LVOT Area:     5.73 cm  LV Volumes (MOD) LV vol d, MOD A4C: 137.0 ml LV vol s, MOD A4C: 115.0 ml LV SV MOD A4C:     137.0 ml RIGHT VENTRICLE RV Basal diam:  3.50 cm RV S prime:     6.53 cm/s TAPSE (M-mode): 1.9 cm LEFT ATRIUM              Index       RIGHT ATRIUM           Index LA diam:        5.10 cm  2.79 cm/m  RA Area:     19.60 cm LA Vol (A2C):   120.0 ml 65.72 ml/m RA Volume:   60.00 ml  32.86 ml/m LA Vol (A4C):   82.1 ml  44.97 ml/m LA Biplane Vol: 101.0 ml 55.32 ml/m  AORTIC VALVE LVOT Vmax:   87.10 cm/s LVOT Vmean:  70.400 cm/s LVOT VTI:    0.202 m  AORTA Ao Root diam: 2.90 cm MITRAL VALVE                TRICUSPID VALVE MV Area (PHT): 3.53 cm     TR Peak grad:   47.1 mmHg MV Area  VTI:   2.64 cm     TR Vmax:        343.00 cm/s MV Peak grad:  10.8 mmHg MV Mean grad:  3.0 mmHg     SHUNTS MV Vmax:       1.64 m/s     Systemic VTI:  0.20 m MV Vmean:      81.0 cm/s    Systemic Diam: 2.70 cm MV Decel Time: 215 msec MV E velocity: 150.00 cm/s MV A velocity: 53.90 cm/s MV E/A ratio:  2.78 Jenkins Rouge MD Electronically signed by Jenkins Rouge MD Signature Date/Time: 01/21/2021/2:08:58 PM    Final       Flora Lipps, MD  [facility] 01/23/2021  If 7PM-7AM, please contact night-coverage

## 2021-01-23 NOTE — Evaluation (Addendum)
Physical Therapy  1x Evaluation Patient Details Name: [name] MRN: [id number] DOB: Oct 08, 1946 Today's Date: 01/23/2021   History of Present Illness  [name] is a 75 y.o. female presented to hospital with concerns of lower extremity edema and shortness of breath for 2 weeks.   In the ED patient was mildly tachycardic and hypertensive with mild hyponatremia.  Chest x-ray showed cardiomegaly but no pulmonary edema but patient did have orthopnea and lower extremity edema.  Patient was then admitted hospital for acute decompensated congestive heart failure. PMHx: with medical history significant for chronic systolic heart failure, atrial fibrillation on amiodarone, V. fib s/p ICD, hypertension, hyperlipidemia and IBS  Clinical Impression   Pt on RA when I arrived and sats at 95% at rest and 99% with activity. Pt did mobilize with her new RW ( noticed it was too large for her, TOC notified and will switch it out for her) . Tolerated using RW for ambulation, educated to use this at home and encouraged several short walks in the home to get her strength back and endurance,. Recommended HHPT follow pt at home, however she declined stating her granddaughter would help her get strong again.   PT to sign off at this time, encourage pt to walk with nursing in hallway 3 times a day and at home.     Follow Up Recommendations No PT follow up (pt could benefit from HHPT , however pt declined and wanted to get strength back independently at home with grandoughter assisting)    Equipment Recommendations  Rolling walker with 5" wheels    Recommendations for Other Services       Precautions / Restrictions Precautions Precautions: None      Mobility  Bed Mobility Overal bed mobility: Independent                  Transfers Overall transfer level: Modified independent Equipment used: Rolling walker (2 wheeled)             General transfer comment: trasnfers to [facility] in room  independently, and used sit to stand to RW with cues, but no safety deficits noted  Ambulation/Gait Ambulation/Gait assistance: Supervision Gait Distance (Feet): 100 Feet Assistive device: Rolling walker (2 wheeled) Gait Pattern/deviations: Step-to pattern     General Gait Details: steady, did take standign rest breaks due to some SOB , but then able to keep going and she felt better after session  Stairs            Wheelchair Mobility    Modified Rankin (Stroke Patients Only)       Balance Overall balance assessment: Needs assistance Sitting-balance support: Bilateral upper extremity supported;Feet supported Sitting balance-Leahy Scale: Normal     Standing balance support: Bilateral upper extremity supported;During functional activity Standing balance-Leahy Scale: Good                               Pertinent Vitals/Pain Pain Assessment: 0-10 Pain Score: 3  Pain Location: my legs hurt, all over, but she stated they felt better after she walked around a little bit Pain Descriptors / Indicators: Aching Pain Intervention(s): Monitored during session    Home Living Family/patient expects to be discharged to:: Private residence Living Arrangements: Other relatives (Grandaughter lives with her) Available Help at Discharge: Family Type of Home: House Home Access: Stairs to enter Entrance Stairs-Rails: Can reach both Entrance Stairs-Number of Steps: 2 Home Layout: One level Home Equipment:  Cane - single point      Prior Function Level of Independence: Independent with assistive device(s)         Comments: pt states she used cane prior to admision, but had become harder over teh last 2 weeks. Grandoughter helps with things whenever needed, but she does work and pt is home alone a lot.     Hand Dominance        Extremity/Trunk Assessment        Lower Extremity Assessment Lower Extremity Assessment: Overall WFL for tasks assessed        Communication   Communication: No difficulties  Cognition Arousal/Alertness: Awake/alert Behavior During Therapy: WFL for tasks assessed/performed Overall Cognitive Status: Within Functional Limits for tasks assessed                                        General Comments      Exercises     Assessment/Plan    PT Assessment Patent does not need any further PT services  PT Problem List Decreased strength;Decreased activity tolerance       PT Treatment Interventions DME instruction;Gait training;Therapeutic activities;Therapeutic exercise    PT Goals (Current goals can be found in the Care Plan section)  Acute Rehab PT Goals Patient Stated Goal: TO feel better again PT Goal Formulation: All assessment and education complete, DC therapy    Frequency     Barriers to discharge        Co-evaluation               AM-PAC PT "6 Clicks" Mobility  Outcome Measure Help needed turning from your back to your side while in a flat bed without using bedrails?: None Help needed moving from lying on your back to sitting on the side of a flat bed without using bedrails?: None Help needed moving to and from a bed to a chair (including a wheelchair)?: None Help needed standing up from a chair using your arms (e.g., wheelchair or bedside chair)?: A Little Help needed to walk in hospital room?: A Little Help needed climbing 3-5 steps with a railing? : A Little 6 Click Score: 21    End of Session Equipment Utilized During Treatment: Gait belt Activity Tolerance: Patient tolerated treatment well Patient left: in bed;with call bell/phone within reach Nurse Communication: Mobility status PT Visit Diagnosis: Muscle weakness (generalized) (M62.81)    Time: 1500-1530 PT Time Calculation (min) (ACUTE ONLY): 30 min   Charges:   PT Evaluation $PT Eval Low Complexity: 1 Low PT Treatments $Gait Training: 8-22 mins        [name], PT, MPT Acute Rehabilitation  Services Office: [phone] Pager: [phone] 01/23/2021   [name] 01/23/2021, 3:50 PM

## 2021-01-23 NOTE — TOC Progression Note (Signed)
Transition of Care [facility]) - Progression Note    Patient Details  Name: [name] MRN: [id number] Date of Birth: [birth year]  Transition of Care [facility]) CM/SW Contact  Joaquin Courts, RN Phone Number: 01/23/2021, 12:57 PM  Clinical Narrative:    [employer] to deliver rolling walker to bedside for home use.   Expected Discharge Plan: Home/Self Care Barriers to Discharge: No Barriers Identified  Expected Discharge Plan and Services Expected Discharge Plan: Home/Self Care                         DME Arranged: Walker rolling DME Agency: [employer] Date DME Agency Contacted: 01/23/21 Time DME Agency Contacted: [id number] Representative spoke with at DME Agency: Lucrecia             Social Determinants of Health ([place]) Interventions    Readmission Risk Interventions No flowsheet data found.

## 2021-01-24 LAB — CBC
HCT: 33.1 % — ABNORMAL LOW (ref 36.0–46.0)
Hemoglobin: 11.4 g/dL — ABNORMAL LOW (ref 12.0–15.0)
MCH: 33.4 pg (ref 26.0–34.0)
MCHC: 34.4 g/dL (ref 30.0–36.0)
MCV: 97.1 fL (ref 80.0–100.0)
Platelets: 269 10*3/uL (ref 150–400)
RBC: 3.41 MIL/uL — ABNORMAL LOW (ref 3.87–5.11)
RDW: 14.1 % (ref 11.5–15.5)
WBC: 5 10*3/uL (ref 4.0–10.5)
nRBC: 0 % (ref 0.0–0.2)

## 2021-01-24 LAB — BASIC METABOLIC PANEL
Anion gap: 14 (ref 5–15)
BUN: 12 mg/dL (ref 8–23)
CO2: 24 mmol/L (ref 22–32)
Calcium: 8.5 mg/dL — ABNORMAL LOW (ref 8.9–10.3)
Chloride: 94 mmol/L — ABNORMAL LOW (ref 98–111)
Creatinine, Ser: 1.04 mg/dL — ABNORMAL HIGH (ref 0.44–1.00)
GFR, Estimated: 57 mL/min — ABNORMAL LOW (ref >60)
Glucose, Bld: 93 mg/dL (ref 70–99)
Potassium: 3.7 mmol/L (ref 3.5–5.1)
Sodium: 132 mmol/L — ABNORMAL LOW (ref 135–145)

## 2021-01-24 LAB — PHOSPHORUS: Phosphorus: 4.2 mg/dL (ref 2.5–4.6)

## 2021-01-24 LAB — MAGNESIUM: Magnesium: 2.1 mg/dL (ref 1.7–2.4)

## 2021-01-24 MED ORDER — CYANOCOBALAMIN 1000 MCG PO TABS: 1000.0000 ug | ORAL_TABLET | Freq: Every day | ORAL | 0 refills | Status: DC

## 2021-01-24 MED ORDER — EMPAGLIFLOZIN 10 MG PO TABS: 10.0000 mg | ORAL_TABLET | Freq: Every day | ORAL | 2 refills | Status: DC

## 2021-01-24 MED ORDER — FOLIC ACID 1 MG PO TABS: 1.0000 mg | ORAL_TABLET | Freq: Every day | ORAL | 0 refills | Status: DC

## 2021-01-24 MED ORDER — GABAPENTIN 100 MG PO CAPS
100.0000 mg | ORAL_CAPSULE | Freq: Every day | ORAL | 2 refills | Status: DC
Start: 2021-01-24 — End: 2022-01-31

## 2021-01-24 MED ORDER — FUROSEMIDE 20 MG PO TABS: 40.0000 mg | ORAL_TABLET | Freq: Every day | ORAL | 2 refills | Status: DC

## 2021-01-24 MED ORDER — CARVEDILOL 3.125 MG PO TABS: 3.1250 mg | ORAL_TABLET | Freq: Two times a day (BID) | ORAL | 2 refills | Status: DC

## 2021-01-24 MED ORDER — SPIRONOLACTONE 25 MG PO TABS: 12.5000 mg | ORAL_TABLET | Freq: Every day | ORAL | 2 refills | Status: DC

## 2021-01-24 MED ORDER — ADULT MULTIVITAMIN W/MINERALS CH: 1.0000 | ORAL_TABLET | Freq: Every day | ORAL | 0 refills | Status: DC

## 2021-01-24 NOTE — Progress Notes (Signed)
Discharge instructions given and explained to patient/daughter in-law, they verbalize understanding, patient denies any pain/distress, no pressure wound noted. Accompanied home by daughter in-law.

## 2021-01-24 NOTE — Discharge Summary (Signed)
Physician Discharge Summary  [name] [id number] DOB: 1946/09/21 DOA: 01/20/2021  PCP: Patient, No Pcp Per (Inactive)  Admit date: 01/20/2021 Discharge date: 01/24/2021  Admitted From: Home  Discharge disposition: Home    Recommendations for Outpatient Follow-Up:   . Follow up with your primary care provider in one week.  . Check CBC, BMP, magnesium in the next visit . Follow-up with Dr. Terrence Dupont cardiology in 1 to 2 weeks. . Check TSH in 4 to 6 weeks.  Dose of Synthroid was recently increased.  Discharge Diagnosis:   Principal Problem:   Acute CHF (congestive heart failure) (HCC) Active Problems:   Hypothyroidism   Automatic implantable cardioverter-defibrillator in situ   Atrial fibrillation (HCC)   Ventricular fibrillation (HCC)   Macrocytic anemia   Toe injury, left, initial encounter   Acute respiratory failure with hypoxia [facility])  Discharge Condition: Improved.  Diet recommendation: Low sodium, heart healthy.   Wound care: None.  Code status: Full.   History of Present Illness:   [name] a 75 y.o.femalewith medical history significant forchronic systolic heart failure, atrial fibrillation on amiodarone, V. fib s/p ICD, hypertension, hyperlipidemia and IBS presented to hospital with concerns of lower extremity edema and shortness of breath for 2 weeks.  Patient was last seen by cardiology in February and had Entresto added to her medication regimen but was not on diuretic.  In the ED patient was mildly tachycardic and hypertensive with mild hyponatremia.  Chest x-ray showed cardiomegaly but no pulmonary edema but patient did have orthopnea and lower extremity edema.  Patient was then admitted hospital for acute decompensated congestive heart failure.  During hospitalization, patient ween seen by cardiology.  Patient was continued on diuretics.  Medications were adjusted.   Hospital Course:   Following conditions were addressed during hospitalization  as listed below,  Acute on chronic systolic heart failure Improved..  2D echocardiogram from 01/21/2021 showed LV ejection fraction of 30 to 35% with no regional wall motion abnormality.  Patient presented with lower extremity edema orthopnea with BNP more than 4500.  No chest pain.  Troponin borderline at 23>26.    Patient received IV Lasix during hospitalization patient was changed to oral Lasix on discharge.    Cardiology was consulted and Toprol-XL has been changed to Coreg twice daily.  Patient has been started on Low-dose spironolactone and Jardiance has been added.  Synthroid has been increased to 125 mg daily.    Cardiology recommends outpatient follow-up with cardiology for titration of her medications.  Mild elevated troponin no chest pain, elevated troponin likely secondary to decompensated heart failure.    Acute hypoxic respiratory failure secondary to CHF exacerbation She was initially on 3 L of oxygen by nasal cannula.  Patient was 89% on room air when she presented to hospital.  As patient got diuresed her oxygen was weaned off.  Renal insufficiency Creatinine at 1.1 presentation.    Outpatient monitoring.  1.0  Macrocytic anemia Vitamin B12 levels low.  Folate at 16.5.  Received 1 dose of parenteral vitamin B12 followed by oral vitamin B12.    Patient will be continued on vitamin B12 on discharge.  Falls, leg pain, neuropathy.    Neuropathy could be secondary to vitamin B12 deficiency.    Will continue vitamin B12 orally on discharge.    Add low-dose gabapentin at nighttime as well.  PT was consulted who recommended home PT but patient has refused at this time.  History of atrial fibrillation Rate controlled .  Continue  amiodarone and Coreg from home.  History of ventricular fibrillation s/p ICD AICD was interrogated with no arrhythmias.    Hypothyroidism  Continue levothyroxine. TSH elevated at 20.4.  Was 3.6, five years back.  On amiodarone.  Synthroid dose was  increased to 125 mcg per cardiology.  Will need to check TSH in 4 to 6 weeks.  Left great toeDorsal PIPecchymosis s/p fall No evidence of fracture.  Soft tissue swelling present.  Supportive care.  Disposition.  At this time, patient is stable for disposition home with outpatient PCP and cardiology follow-up.  Medical Consultants:    Cardiology  Procedures:    None Subjective:   Today, patient was seen and examined at bedside.  Feels better with pain over lower extremity.  Has some weakness and fatigue but does not wish home health.  Discharge Exam:   Vitals:   01/24/21 0605 01/24/21 0830  BP: 129/69 (!) 141/52  Pulse: 65 62  Resp: 20   Temp: 98.1 F (36.7 C)   SpO2: 94%    Vitals:   01/23/21 1542 01/23/21 2132 01/24/21 0605 01/24/21 0830  BP:  110/65 129/69 (!) 141/52  Pulse:  65 65 62  Resp:  20 20   Temp:  98.3 F (36.8 C) 98.1 F (36.7 C)   TempSrc:  Oral    SpO2: 96% 94% 94%   Weight:   76.8 kg   Height:       General: Alert awake, not in obvious distress, obese built, female, mildly hard of hearing HENT: pupils equally reacting to light,  No scleral pallor or icterus noted. Oral mucosa is moist.  Chest:    Diminished breath sounds bilaterally. No crackles or wheezes.  CVS: S1 &S2 heard. No murmur.  Regular rate and rhythm. Abdomen: Soft, nontender, nondistended.  Bowel sounds are heard.   Extremities: No cyanosis, clubbing but bilateral lower extremity edema noted.  Has improved.  Peripheral pulses are palpable. Psych: Alert, awake and oriented, normal mood CNS:  No cranial nerve deficits.  Power equal in all extremities.   Skin: Warm and dry.  No rashes noted.  The results of significant diagnostics from this hospitalization (including imaging, microbiology, ancillary and laboratory) are listed below for reference.     Diagnostic Studies:   DG Chest 2 View  Result Date: 01/20/2021 CLINICAL DATA:  Weakness and shortness of breath on exertion for 2  weeks. Increased edema in the extremities. EXAM: CHEST - 2 VIEW COMPARISON:  04/06/2016 FINDINGS: Postoperative changes in the mediastinum. Cardiac pacemaker. Cardiac enlargement. No vascular congestion, edema, or consolidation is appreciated. Patient rotation does limit examination. No pleural effusions. No pneumothorax. Calcification of the aorta. IMPRESSION: Cardiac enlargement. No evidence of active pulmonary disease. Electronically Signed   By: Lucienne Capers M.D.   On: 01/20/2021 23:16   DG Foot 2 Views Left  Result Date: 01/21/2021 CLINICAL DATA:  Trip and fall injury 2 days ago. Bruising to the posterior surface of the foot and anterior second and third metatarsal area. EXAM: LEFT FOOT - 2 VIEW COMPARISON:  None. FINDINGS: Soft tissue swelling over the dorsum of the foot. No acute fracture or dislocation. Old fracture deformity of the fifth metatarsal shaft. Degenerative changes in the interphalangeal, intertarsal and first metatarsal-phalangeal joints. No focal bone lesion or bone destruction. Vascular calcifications in the soft tissues. IMPRESSION: Soft tissue swelling. No acute bony abnormalities. Degenerative changes. Electronically Signed   By: Lucienne Capers M.D.   On: 01/21/2021 01:20   ECHOCARDIOGRAM COMPLETE  Result Date: 01/21/2021    ECHOCARDIOGRAM REPORT   Patient Name:   [name] Date of Exam: 01/21/2021 Medical Rec #:  [id number]    Height:       64.0 in Accession #:    [id number]   Weight:       170.0 lb Date of Birth:  [birth year]    BSA:          1.826 m Patient Age:    42 years     BP:           149/76 mmHg Patient Gender: F            HR:           60 bpm. Exam Location:  Inpatient Procedure: 2D Echo, Cardiac Doppler and Color Doppler Indications:    CHF  History:        Patient has prior history of Echocardiogram examinations, most                 recent 05/15/2013. CHF, Defibrillator, Mitral Valve Disease and                 MV repair, Arrythmias:Atrial Fibrillation,                  Signs/Symptoms:Shortness of Breath and LE edema; Risk                 Factors:Hypertension and Dyslipidemia.  Sonographer:    Dustin Flock Referring Phys: [id number] [place]  1. Septal / lateral hypokinesis inferior akinesis . Left ventricular ejection fraction, by estimation, is 30 to 35%. The left ventricle has moderately decreased function. The left ventricle has no regional wall motion abnormalities. The left ventricular  internal cavity size was moderately dilated. There is mild left ventricular hypertrophy. Left ventricular diastolic parameters are indeterminate.  2. AICD wires noted . Right ventricular systolic function is normal. The right ventricular size is normal. There is moderately elevated pulmonary artery systolic pressure.  3. Left atrial size was moderately dilated.  4. Right atrial size was mildly dilated.  5. Post repair trivial residual MR MVA PT1/2 3.14 and mean gradient at HR 59 bpm only 3 mmHg . The mitral valve has been repaired/replaced. Trivial mitral valve regurgitation. No evidence of mitral stenosis.  6. The aortic valve is tricuspid. There is mild calcification of the aortic valve. Aortic valve regurgitation is not visualized. Mild aortic valve sclerosis is present, with no evidence of aortic valve stenosis.  7. The inferior vena cava is dilated in size with >50% respiratory variability, suggesting right atrial pressure of 8 mmHg. FINDINGS  Left Ventricle: Septal / lateral hypokinesis inferior akinesis. Left ventricular ejection fraction, by estimation, is 30 to 35%. The left ventricle has moderately decreased function. The left ventricle has no regional wall motion abnormalities. The left  ventricular internal cavity size was moderately dilated. There is mild left ventricular hypertrophy. Left ventricular diastolic parameters are indeterminate. Right Ventricle: AICD wires noted. The right ventricular size is normal. No increase in right ventricular wall thickness.  Right ventricular systolic function is normal. There is moderately elevated pulmonary artery systolic pressure. The tricuspid regurgitant velocity is 3.43 m/s, and with an assumed right atrial pressure of 3 mmHg, the estimated right ventricular systolic pressure is [id number] mmHg. Left Atrium: Left atrial size was moderately dilated. Right Atrium: Right atrial size was mildly dilated. Pericardium: There is no evidence of pericardial effusion. Mitral Valve: Post repair trivial residual MR MVA PT1/2  3.14 and mean gradient at HR 59 bpm only 3 mmHg. The mitral valve has been repaired/replaced. Trivial mitral valve regurgitation. No evidence of mitral valve stenosis. MV peak gradient, 10.8 mmHg. The mean mitral valve gradient is 3.0 mmHg. Tricuspid Valve: The tricuspid valve is normal in structure. Tricuspid valve regurgitation is mild . No evidence of tricuspid stenosis. Aortic Valve: The aortic valve is tricuspid. There is mild calcification of the aortic valve. Aortic valve regurgitation is not visualized. Mild aortic valve sclerosis is present, with no evidence of aortic valve stenosis. Pulmonic Valve: The pulmonic valve was normal in structure. Pulmonic valve regurgitation is not visualized. No evidence of pulmonic stenosis. Aorta: The aortic root is normal in size and structure. Venous: The inferior vena cava is dilated in size with greater than 50% respiratory variability, suggesting right atrial pressure of 8 mmHg. IAS/Shunts: No atrial level shunt detected by color flow Doppler.  LEFT VENTRICLE PLAX 2D LVIDd:         5.30 cm      Diastology LVIDs:         5.00 cm      LV e' medial:    4.68 cm/s LV PW:         1.20 cm      LV E/e' medial:  32.1 LV IVS:        1.20 cm      LV e' lateral:   10.00 cm/s LVOT diam:     2.70 cm      LV E/e' lateral: 15.0 LV SV:         116 LV SV Index:   63 LVOT Area:     5.73 cm  LV Volumes (MOD) LV vol d, MOD A4C: 137.0 ml LV vol s, MOD A4C: 115.0 ml LV SV MOD A4C:     137.0 ml RIGHT  VENTRICLE RV Basal diam:  3.50 cm RV S prime:     6.53 cm/s TAPSE (M-mode): 1.9 cm LEFT ATRIUM              Index       RIGHT ATRIUM           Index LA diam:        5.10 cm  2.79 cm/m  RA Area:     19.60 cm LA Vol (A2C):   120.0 ml 65.72 ml/m RA Volume:   60.00 ml  32.86 ml/m LA Vol (A4C):   82.1 ml  44.97 ml/m LA Biplane Vol: 101.0 ml 55.32 ml/m  AORTIC VALVE LVOT Vmax:   87.10 cm/s LVOT Vmean:  70.400 cm/s LVOT VTI:    0.202 m  AORTA Ao Root diam: 2.90 cm MITRAL VALVE                TRICUSPID VALVE MV Area (PHT): 3.53 cm     TR Peak grad:   47.1 mmHg MV Area VTI:   2.64 cm     TR Vmax:        343.00 cm/s MV Peak grad:  10.8 mmHg MV Mean grad:  3.0 mmHg     SHUNTS MV Vmax:       1.64 m/s     Systemic VTI:  0.20 m MV Vmean:      81.0 cm/s    Systemic Diam: 2.70 cm MV Decel Time: 215 msec MV E velocity: 150.00 cm/s MV A velocity: 53.90 cm/s MV E/A ratio:  2.78 Jenkins Rouge MD Electronically signed by Jenkins Rouge MD Signature Date/Time:  01/21/2021/2:08:58 PM    Final      Labs:   Basic Metabolic Panel: Recent Labs  Lab 01/20/21 2338 01/21/21 0228 01/22/21 0539 01/23/21 0445 01/24/21 0534  NA 132* 135 132*  --  132*  K 4.3 4.7 4.1  --  3.7  CL 99 100 93*  --  94*  CO2 '24 25 30  '$ --  24  GLUCOSE 109* 102* 110*  --  93  BUN '13 13 16  '$ --  12  CREATININE 1.10* 1.14* 1.21*  --  1.04*  CALCIUM 8.9 9.2 9.0  --  8.5*  MG  --   --  2.1 2.3 2.1  PHOS  --   --  4.9* 5.4* 4.2   GFR Estimated Creatinine Clearance: 48.3 mL/min (A) (by C-G formula based on SCr of 1.04 mg/dL (H)). Liver Function Tests: No results for input(s): AST, ALT, ALKPHOS, BILITOT, PROT, ALBUMIN in the last 168 hours. No results for input(s): LIPASE, AMYLASE in the last 168 hours. No results for input(s): AMMONIA in the last 168 hours. Coagulation profile No results for input(s): INR, PROTIME in the last 168 hours.  CBC: Recent Labs  Lab 01/20/21 2338 01/21/21 0228 01/23/21 0445 01/24/21 0534  WBC 5.4 5.5 4.7 5.0   NEUTROABS 4.4  --   --   --   HGB 10.4* 10.4* 10.6* 11.4*  HCT 31.7* 32.0* 32.4* 33.1*  MCV 100.6* 100.9* 102.2* 97.1  PLT 284 285 255 269   Cardiac Enzymes: No results for input(s): CKTOTAL, CKMB, CKMBINDEX, TROPONINI in the last 168 hours. BNP: Invalid input(s): POCBNP CBG: No results for input(s): GLUCAP in the last 168 hours. D-Dimer No results for input(s): DDIMER in the last 72 hours. Hgb A1c No results for input(s): HGBA1C in the last 72 hours. Lipid Profile No results for input(s): CHOL, HDL, LDLCALC, TRIG, CHOLHDL, LDLDIRECT in the last 72 hours. Thyroid function studies No results for input(s): TSH, T4TOTAL, T3FREE, THYROIDAB in the last 72 hours.  Invalid input(s): FREET3 Anemia work up No results for input(s): VITAMINB12, FOLATE, FERRITIN, TIBC, IRON, RETICCTPCT in the last 72 hours. Microbiology Recent Results (from the past 240 hour(s))  Resp Panel by RT-PCR (Flu A&B, Covid) Nasopharyngeal Swab     Status: None   Collection Time: 01/20/21 11:40 PM   Specimen: Nasopharyngeal Swab; Nasopharyngeal(NP) swabs in vial transport medium  Result Value Ref Range Status   SARS Coronavirus 2 by RT PCR NEGATIVE NEGATIVE Final    Comment: (NOTE) SARS-CoV-2 target nucleic acids are NOT DETECTED.  The SARS-CoV-2 RNA is generally detectable in upper respiratory specimens during the acute phase of infection. The lowest concentration of SARS-CoV-2 viral copies this assay can detect is 138 copies/mL. A negative result does not preclude SARS-Cov-2 infection and should not be used as the sole basis for treatment or other patient management decisions. A negative result may occur with  improper specimen collection/handling, submission of specimen other than nasopharyngeal swab, presence of viral mutation(s) within the areas targeted by this assay, and inadequate number of viral copies(<138 copies/mL). A negative result must be combined with clinical observations, patient history,  and epidemiological information. The expected result is Negative.  Fact Sheet for Patients:  EntrepreneurPulse.com.au  Fact Sheet for Healthcare Providers:  IncredibleEmployment.be  This test is no t yet approved or cleared by the [place] FDA and  has been authorized for detection and/or diagnosis of SARS-CoV-2 by FDA under an Emergency Use Authorization (EUA). This EUA will remain  in effect (  meaning this test can be used) for the duration of the COVID-19 declaration under Section 564(b)(1) of the Act, 21 U.S.C.section 360bbb-3(b)(1), unless the authorization is terminated  or revoked sooner.    specimens during the acute phase of infection. The lowest concentration of SARS-CoV-2 viral copies this assay can detect is 138 copies/mL. A negative result does not preclude SARS-Cov-2 infection and should not be used as the sole basis for treatment or other patient management decisions. A negative result may occur with  improper specimen collection/handling, submission of specimen other than nasopharyngeal swab, presence of viral mutation(s) within the areas targeted by this assay, and inadequate number of viral copies(<138 copies/mL). A negative result must be c ombined with clinical observations, patient history, and epidemiological information. The expected result is Negative.  Fact Sheet for Patients:  EntrepreneurPulse.com.au  Fact Sheet for Healthcare Providers:  IncredibleEmployment.be  This test is not yet approved or cleared by the [place] FDA and  has been authorized for detection and/or diagnosis of SARS-CoV-2 by FDA under an Emergency Use Authorization (EUA). This EUA will remain  in effect (meaning this test can be used) for the duration of the COVID-19 declaration under Section 564(b)(1) of the Act, 21 U.S.C.section 360bbb-3(b)(1), unless the authorization is terminated  or revoked  sooner.       Influenza A by PCR NEGATIVE NEGATIVE Final   Influenza B by PCR NEGATIVE NEGATIVE Final    Comment: (NOTE) The Xpert Xpress SARS-CoV-2/FLU/RSV plus assay is intended as an aid in the diagnosis of influenza from Nasopharyngeal swab specimens and should not be used as a sole basis for treatment. Nasal washings and aspirates are unacceptable for Xpert Xpress SARS-CoV-2/FLU/RSV testing.  Fact Sheet for Patients: EntrepreneurPulse.com.au  Fact Sheet for Healthcare Providers: IncredibleEmployment.be  This test is not yet approved or cleared by the [place] FDA and has been authorized for detection and/or diagnosis of SARS-CoV-2 by FDA under an Emergency Use Authorization (EUA). This EUA will remain in effect (meaning this test can be used) for the duration of the COVID-19 declaration under Section 564(b)(1) of the Act, 21 U.S.C. section 360bbb-3(b)(1), unless the authorization is terminated or revoked.  Performed at [facility], [place] [address] [place], [place] [address]      Discharge Instructions:   Discharge Instructions    (HEART FAILURE PATIENTS) Call MD:  Anytime you have any of the following symptoms: 1) 3 pound weight gain in 24 hours or 5 pounds in 1 week 2) shortness of breath, with or without a dry hacking cough 3) swelling in the hands, feet or stomach 4) if you have to sleep on extra pillows at night in order to breathe.   Complete by: As directed    Diet - low sodium heart healthy   Complete by: As directed    Fluid restriction 1500 MLS per day.   Discharge instructions   Complete by: As directed    Follow-up with your primary care physician in 1 week.  Check blood work at that time.  Follow-up with Dr. Terrence Dupont cardiology to adequately adjust your medication for your heart failure in 1-2 weeks.  Fluid restriction 1500 MLS per day.  Low-salt diet.  Please weigh yourself every day.   Increase  activity slowly   Complete by: As directed      Allergies as of 01/24/2021      Reactions   Morphine And Related Itching, Nausea And Vomiting   Codeine Nausea And Vomiting   Demerol [meperidine  Hcl] Nausea And Vomiting   Meperidine Hcl Nausea And Vomiting   Pollen Extract Itching, Other (See Comments)   Seasonal allergies      Medication List    STOP taking these medications   metoprolol succinate 50 MG 24 hr tablet Commonly known as: TOPROL-XL     TAKE these medications   acetaminophen 500 MG tablet Commonly known as: TYLENOL Take 1,000 mg by mouth every 6 (six) hours as needed (back/neck pain). For pain   amiodarone 200 MG tablet Commonly known as: PACERONE Take amiodarone 200 mg one tablet daily Monday through Saturday.  Do not take amiodarone on Sunday. What changed:   how much to take  how to take this  when to take this  additional instructions   aspirin EC 81 MG tablet Take 81 mg by mouth every other day. Swallow whole.   aspirin-acetaminophen-caffeine 250-250-65 MG tablet Commonly known as: EXCEDRIN MIGRAINE Take 1 tablet by mouth every 6 (six) hours as needed for headache.   BC HEADACHE POWDER PO Take 1 Package by mouth 2 (two) times daily as needed (headache/pain).   carvedilol 3.125 MG tablet Commonly known as: COREG Take 1 tablet (3.125 mg total) by mouth 2 (two) times daily with a meal.   cyanocobalamin 1000 MCG tablet Take 1 tablet (1,000 mcg total) by mouth daily.   empagliflozin 10 MG Tabs tablet Commonly known as: JARDIANCE Take 1 tablet (10 mg total) by mouth daily.   Entresto 97-103 MG Generic drug: sacubitril-valsartan Take 1 tablet by mouth 2 (two) times daily.   folic acid 1 MG tablet Commonly known as: FOLVITE Take 1 tablet (1 mg total) by mouth daily.   furosemide 20 MG tablet Commonly known as: Lasix Take 2 tablets (40 mg total) by mouth daily.   gabapentin 100 MG capsule Commonly known as: NEURONTIN Take 1 capsule (100  mg total) by mouth at bedtime.   hydrOXYzine 10 MG tablet Commonly known as: ATARAX/VISTARIL Take 10 mg by mouth at bedtime as needed for itching (allergies).   levothyroxine 100 MCG tablet Commonly known as: SYNTHROID Take 100 mcg by mouth daily before breakfast.   multivitamin with minerals Tabs tablet Take 1 tablet by mouth daily.   MURINE TEARS FOR DRY EYES OP Place 2 drops into both eyes 2 (two) times daily as needed. For dry eyes   PARoxetine 30 MG tablet Commonly known as: PAXIL Take 60 mg by mouth every evening.   pravastatin 40 MG tablet Commonly known as: PRAVACHOL TAKE 1 TABLET BY MOUTH EVERY EVENING What changed:   how much to take  how to take this  when to take this  additional instructions   sodium chloride 0.65 % Soln nasal spray Commonly known as: OCEAN Place 1 spray into both nostrils as needed (dry nasal passages).   spironolactone 25 MG tablet Commonly known as: ALDACTONE Take 0.5 tablets (12.5 mg total) by mouth daily.   Visine Dry Eye 0.2-0.2-1 % Soln Generic drug: Glycerin-Hypromellose-PEG 400 Place 1-2 drops into both eyes 3 (three) times daily as needed (dry/irritated eyes).            Durable Medical Equipment  (From admission, onward)         Start     Ordered   01/23/21 0923  For home use only DME Walker  Once       Question:  Patient needs a walker to treat with the following condition  Answer:  Ambulatory dysfunction   01/23/21 [id number]  Follow-up Information    Primary care provider. Schedule an appointment as soon as possible for a visit in 1 week(s).   Why: regular followup, blood work       Charolette Forward, MD. Schedule an appointment as soon as possible for a visit in 1 week(s).   Specialty: Cardiology Why: for heart failure follow up Contact information: [address] [address] [phone]                Time coordinating discharge: 39 minutes  Signed:  Kenyona Rena  [facility] 01/24/2021, 9:52 AM

## 2021-01-24 NOTE — Progress Notes (Signed)
Patient discharged but waiting on family to pick call her back for transportation.

## 2021-01-24 NOTE — Progress Notes (Signed)
Patient did not have anyone at home to open the door for here. I was able to get hold of patient's daughter in-law and she will be here in an hour to pick her up.

## 2021-02-10 DIAGNOSIS — Z79899 Other long term (current) drug therapy: Secondary | ICD-10-CM | POA: Diagnosis not present

## 2021-02-10 DIAGNOSIS — I5020 Unspecified systolic (congestive) heart failure: Secondary | ICD-10-CM | POA: Diagnosis not present

## 2021-02-10 DIAGNOSIS — I4891 Unspecified atrial fibrillation: Secondary | ICD-10-CM | POA: Diagnosis not present

## 2021-02-10 DIAGNOSIS — D649 Anemia, unspecified: Secondary | ICD-10-CM | POA: Diagnosis not present

## 2021-02-10 DIAGNOSIS — Z9181 History of falling: Secondary | ICD-10-CM | POA: Diagnosis not present

## 2021-02-10 DIAGNOSIS — I4901 Ventricular fibrillation: Secondary | ICD-10-CM | POA: Diagnosis not present

## 2021-02-17 DIAGNOSIS — I480 Paroxysmal atrial fibrillation: Secondary | ICD-10-CM | POA: Diagnosis not present

## 2021-02-17 DIAGNOSIS — I255 Ischemic cardiomyopathy: Secondary | ICD-10-CM | POA: Diagnosis not present

## 2021-02-17 DIAGNOSIS — I5020 Unspecified systolic (congestive) heart failure: Secondary | ICD-10-CM | POA: Diagnosis not present

## 2021-02-17 DIAGNOSIS — E039 Hypothyroidism, unspecified: Secondary | ICD-10-CM | POA: Diagnosis not present

## 2021-02-17 DIAGNOSIS — I10 Essential (primary) hypertension: Secondary | ICD-10-CM | POA: Diagnosis not present

## 2021-02-17 DIAGNOSIS — E785 Hyperlipidemia, unspecified: Secondary | ICD-10-CM | POA: Diagnosis not present

## 2021-03-12 DIAGNOSIS — E039 Hypothyroidism, unspecified: Secondary | ICD-10-CM | POA: Diagnosis not present

## 2021-03-24 DIAGNOSIS — R631 Polydipsia: Secondary | ICD-10-CM | POA: Diagnosis not present

## 2021-03-24 DIAGNOSIS — G629 Polyneuropathy, unspecified: Secondary | ICD-10-CM | POA: Diagnosis not present

## 2021-03-24 DIAGNOSIS — M2550 Pain in unspecified joint: Secondary | ICD-10-CM | POA: Diagnosis not present

## 2021-03-24 DIAGNOSIS — M545 Low back pain, unspecified: Secondary | ICD-10-CM | POA: Diagnosis not present

## 2021-03-24 DIAGNOSIS — N184 Chronic kidney disease, stage 4 (severe): Secondary | ICD-10-CM | POA: Diagnosis not present

## 2021-03-24 DIAGNOSIS — H6121 Impacted cerumen, right ear: Secondary | ICD-10-CM | POA: Diagnosis not present

## 2021-03-24 DIAGNOSIS — M791 Myalgia, unspecified site: Secondary | ICD-10-CM | POA: Diagnosis not present

## 2021-03-24 DIAGNOSIS — M25511 Pain in right shoulder: Secondary | ICD-10-CM | POA: Diagnosis not present

## 2021-03-24 DIAGNOSIS — R7303 Prediabetes: Secondary | ICD-10-CM | POA: Diagnosis not present

## 2021-04-07 DIAGNOSIS — M75101 Unspecified rotator cuff tear or rupture of right shoulder, not specified as traumatic: Secondary | ICD-10-CM | POA: Diagnosis not present

## 2021-05-04 DIAGNOSIS — E538 Deficiency of other specified B group vitamins: Secondary | ICD-10-CM | POA: Diagnosis not present

## 2021-05-04 DIAGNOSIS — I4891 Unspecified atrial fibrillation: Secondary | ICD-10-CM | POA: Diagnosis not present

## 2021-05-04 DIAGNOSIS — M545 Low back pain, unspecified: Secondary | ICD-10-CM | POA: Diagnosis not present

## 2021-05-04 DIAGNOSIS — G629 Polyneuropathy, unspecified: Secondary | ICD-10-CM | POA: Diagnosis not present

## 2021-06-01 ENCOUNTER — Other Ambulatory Visit: Payer: Self-pay | Admitting: Internal Medicine

## 2021-06-04 ENCOUNTER — Other Ambulatory Visit: Payer: Self-pay | Admitting: Internal Medicine

## 2021-06-09 DIAGNOSIS — M170 Bilateral primary osteoarthritis of knee: Secondary | ICD-10-CM | POA: Diagnosis not present

## 2021-06-22 ENCOUNTER — Other Ambulatory Visit: Payer: Self-pay

## 2021-06-22 ENCOUNTER — Ambulatory Visit (INDEPENDENT_AMBULATORY_CARE_PROVIDER_SITE_OTHER): Payer: Medicare Other | Admitting: Internal Medicine

## 2021-06-22 VITALS — BP 132/68 | HR 61 | Ht 60.0 in | Wt 159.6 lb

## 2021-06-22 DIAGNOSIS — I5022 Chronic systolic (congestive) heart failure: Secondary | ICD-10-CM

## 2021-06-22 DIAGNOSIS — I4901 Ventricular fibrillation: Secondary | ICD-10-CM

## 2021-06-22 DIAGNOSIS — I255 Ischemic cardiomyopathy: Secondary | ICD-10-CM | POA: Diagnosis not present

## 2021-06-22 DIAGNOSIS — E039 Hypothyroidism, unspecified: Secondary | ICD-10-CM | POA: Diagnosis not present

## 2021-06-22 DIAGNOSIS — I5020 Unspecified systolic (congestive) heart failure: Secondary | ICD-10-CM | POA: Diagnosis not present

## 2021-06-22 DIAGNOSIS — I480 Paroxysmal atrial fibrillation: Secondary | ICD-10-CM | POA: Diagnosis not present

## 2021-06-22 DIAGNOSIS — Z95810 Presence of automatic (implantable) cardiac defibrillator: Secondary | ICD-10-CM | POA: Diagnosis not present

## 2021-06-22 DIAGNOSIS — I10 Essential (primary) hypertension: Secondary | ICD-10-CM | POA: Diagnosis not present

## 2021-06-22 DIAGNOSIS — E785 Hyperlipidemia, unspecified: Secondary | ICD-10-CM | POA: Diagnosis not present

## 2021-06-22 MED ORDER — PRAVASTATIN SODIUM 40 MG PO TABS: ORAL_TABLET | ORAL | 3 refills | Status: DC

## 2021-06-22 NOTE — Progress Notes (Signed)
HPI [name] returns today for followup. She is a pleasant 75 yo woman with a h/o chronic systolic heart failure, VF arrest s/p ICD insertion, mitral regurge, and chronic amio therapy for prevention of her ventricular arrhythmias. She denies chest pain. She is limited by her knees. She denies peripheral edema. No syncope or ICD shock. No palpitations.  Allergies  Allergen Reactions   Morphine And Related Itching and Nausea And Vomiting   Codeine Nausea And Vomiting   Demerol [Meperidine Hcl] Nausea And Vomiting   Meperidine Hcl Nausea And Vomiting   Pollen Extract Itching and Other (See Comments)    Seasonal allergies     Current Outpatient Medications  Medication Sig Dispense Refill   acetaminophen (TYLENOL) 500 MG tablet Take 1,000 mg by mouth every 6 (six) hours as needed (back/neck pain). For pain     amiodarone (PACERONE) 200 MG tablet Take amiodarone 200 mg one tablet daily Monday through Saturday.  Do not take amiodarone on Sunday. (Patient taking differently: Take 200 mg by mouth every Monday, Tuesday, Wednesday, Thursday, and Friday. In the morning) 60 tablet 1   aspirin EC 81 MG tablet Take 81 mg by mouth every other day. Swallow whole.     aspirin-acetaminophen-caffeine (EXCEDRIN MIGRAINE) 250-250-65 MG per tablet Take 1 tablet by mouth every 6 (six) hours as needed for headache.     Aspirin-Salicylamide-Caffeine (BC HEADACHE POWDER PO) Take 1 Package by mouth 2 (two) times daily as needed (headache/pain).     carvedilol (COREG) 3.125 MG tablet Take 1 tablet (3.125 mg total) by mouth 2 (two) times daily with a meal. 60 tablet 2   empagliflozin (JARDIANCE) 10 MG TABS tablet Take 1 tablet (10 mg total) by mouth daily. 30 tablet 2   ENTRESTO 97-103 MG Take 1 tablet by mouth 2 (two) times daily.     folic acid (FOLVITE) 1 MG tablet Take 1 tablet (1 mg total) by mouth daily. 100 tablet 0   furosemide (LASIX) 20 MG tablet Take 2 tablets (40 mg total) by mouth daily. 30  tablet 2   gabapentin (NEURONTIN) 100 MG capsule Take 1 capsule (100 mg total) by mouth at bedtime. 30 capsule 2   Glycerin-Hypromellose-PEG 400 (VISINE DRY EYE) 0.2-0.2-1 % SOLN Place 1-2 drops into both eyes 3 (three) times daily as needed (dry/irritated eyes).     hydrOXYzine (ATARAX/VISTARIL) 10 MG tablet Take 10 mg by mouth at bedtime as needed for itching (allergies).     levothyroxine (SYNTHROID) 100 MCG tablet Take 100 mcg by mouth daily before breakfast.     Multiple Vitamin (MULTIVITAMIN WITH MINERALS) TABS tablet Take 1 tablet by mouth daily. 100 tablet 0   PARoxetine (PAXIL) 30 MG tablet Take 60 mg by mouth every evening.     Polyvinyl Alcohol-Povidone (MURINE TEARS FOR DRY EYES OP) Place 2 drops into both eyes 2 (two) times daily as needed. For dry eyes     pravastatin (PRAVACHOL) 40 MG tablet TAKE 1 TABLET BY MOUTH EVERY EVENING. Please keep upcoming appointment in  September 2022 for future refills. Thank you 90 tablet 0   sodium chloride (OCEAN) 0.65 % SOLN nasal spray Place 1 spray into both nostrils as needed (dry nasal passages).     spironolactone (ALDACTONE) 25 MG tablet Take 0.5 tablets (12.5 mg total) by mouth daily. 30 tablet 2   vitamin B-12 1000 MCG tablet Take 1 tablet (1,000 mcg total) by mouth daily. 100 tablet 0   No current facility-administered  medications for this visit.     Past Medical History:  Diagnosis Date   Anxiety    Arthritis    Automatic implantable cardioverter-defibrillator in situ    Cardiomegaly    CHF (congestive heart failure) ([place]) 05/2013   class III, severe   Chronic bronchitis (HCC)    Depression    GERD (gastroesophageal reflux disease)    Headache(784.0)    migraines   Heart murmur    History of sudden cardiac arrest successfully resuscitated 2011   HLD (hyperlipidemia)    Hypertension    Hypothyroidism    IBS (irritable bowel syndrome)    Paroxysmal atrial fibrillation (HCC)    Pneumonia    Scoliosis    Severe mitral  regurgitation 05/2013   s/p MV repair with annuloplasty    ROS:   All systems reviewed and negative except as noted in the HPI.   Past Surgical History:  Procedure Laterality Date   ABDOMINAL HYSTERECTOMY     APPENDECTOMY     CARDIAC CATHETERIZATION     CARDIAC DEFIBRILLATOR PLACEMENT  2010   Vfib arrest, single chamber ICD   CARPAL TUNNEL RELEASE Right 03/05/2014   Procedure: RIGHT CARPAL TUNNEL RELEASE;  Surgeon: Schuyler Amor, MD;  Location: [place];  Service: Orthopedics;  Laterality: Right;   CATARACT EXTRACTION W/PHACO Left 09/03/2020   Procedure: CATARACT EXTRACTION PHACO AND INTRAOCULAR LENS PLACEMENT ([place]) LEFT;  Surgeon: Birder Robson, MD;  Location: [facility];  Service: Ophthalmology;  Laterality: Left;  Lot # [id number] [place]: 01:04.1 CDE:11:62    CATARACT EXTRACTION W/PHACO Right 12/31/2020   Procedure: CATARACT EXTRACTION PHACO AND INTRAOCULAR LENS PLACEMENT ([facility]) RIGHT;  Surgeon: Birder Robson, MD;  Location: [facility];  Service: Ophthalmology;  Laterality: Right;  Lot [id number] [place]: 00:55.8 CDE: 6.29   COLONOSCOPY     FRACTURE SURGERY     HERNIA REPAIR  as child   x 2   INTRAOPERATIVE TRANSESOPHAGEAL ECHOCARDIOGRAM N/A 05/24/2013   Procedure: INTRAOPERATIVE TRANSESOPHAGEAL ECHOCARDIOGRAM;  Surgeon: Ivin Poot, MD;  Location: [place];  Service: Open Heart Surgery;  Laterality: N/A;   LEFT HEART CATHETERIZATION WITH CORONARY ANGIOGRAM N/A 05/16/2013   Procedure: LEFT HEART CATHETERIZATION WITH CORONARY ANGIOGRAM;  Surgeon: Clent Demark, MD;  Location: [place] [facility];  Service: Cardiovascular;  Laterality: N/A;   MITRAL VALVE REPAIR N/A 05/24/2013   Surgeon: Ivin Poot, MD; Service: Open Heart Surgery   MULTIPLE EXTRACTIONS WITH ALVEOLOPLASTY N/A 05/21/2013   Procedure: Extraction of tooth #'s 2,4,18 with alveoloplasty and gross debridement of remaining teeth;  Surgeon: Lenn Cal, DDS;  Location: [place];  Service: Oral Surgery;  Laterality: N/A;   OPEN  REDUCTION INTERNAL FIXATION (ORIF) DISTAL RADIAL FRACTURE Right 03/05/2014   Procedure: OPEN REDUCTION INTERNAL FIXATION (ORIF) RIGHT DISTAL RADIUS FRACTURE ;  Surgeon: Schuyler Amor, MD;  Location: [place];  Service: Orthopedics;  Laterality: Right;   TEE WITHOUT CARDIOVERSION  09/24/2012   Procedure: TRANSESOPHAGEAL ECHOCARDIOGRAM (TEE);  Surgeon: Fay Records, MD;  Location: [facility];  Service: Cardiovascular;  Laterality: N/A;     Family History  Problem Relation Age of Onset   Cancer - Other Mother    CAD Mother    Arthritis Mother    CVA Sister    CAD Sister    Seizures Brother    Heart disease Father      Social History   Socioeconomic History   Marital status: Widowed    Spouse name: Not on file   Number  of children: 1   Years of education: 9   Highest education level: Not on file  Occupational History   Occupation: Retired  Tobacco Use   Smoking status: Never   Smokeless tobacco: Never  Vaping Use   Vaping Use: Never used  Substance and Sexual Activity   Alcohol use: No   Drug use: No   Sexual activity: Yes    Birth control/protection: Surgical  Other Topics Concern   Not on file  Social History Narrative   Fun: Shop, read, dance   Denies religious beliefs effecting health care.    Social Determinants of Health   Financial Resource Strain: Not on file  Food Insecurity: Not on file  Transportation Needs: Not on file  Physical Activity: Not on file  Stress: Not on file  Social Connections: Not on file  Intimate Partner Violence: Not on file     BP 132/68   Pulse 61   Ht 5' (1.524 m)   Wt 159 lb 9.6 oz (72.4 kg)   SpO2 94%   BMI 31.17 kg/m   Physical Exam:  Well appearing 75 yo woman, NAD HEENT: Unremarkable Neck:  No JVD, no thyromegally Lymphatics:  No adenopathy Back:  No CVA tenderness Lungs:  Clear with no wheezes HEART:  Regular rate rhythm, no murmurs, no rubs, no clicks Abd:  soft, positive bowel sounds, no organomegally, no  rebound, no guarding Ext:  2 plus pulses, no edema, no cyanosis, no clubbing Skin:  No rashes no nodules Neuro:  CN II through XII intact, motor grossly intact  EKG - nsr  DEVICE  Normal device function.  See PaceArt for details.   Assess/Plan:  1. Abnormal ECG - she is maintaining NSR and her ECG has normalized.  2. VF arrest - she has had no recurrent arrhythmias on low dose amiodarone. 3. ICD - her [place] ICD is working normally. She has 0.6 years on the battery 4. Chronic systolic heart failure - her symptoms are class 2. She will continue her current meds.   Salome Spotted.

## 2021-06-22 NOTE — Patient Instructions (Addendum)
Medication Instructions:  Your physician recommends that you continue on your current medications as directed. Please refer to the Current Medication list given to you today.  Labwork: None ordered.  Testing/Procedures: None ordered.  Follow-Up:  Your physician wants you to follow-up in: 4 months with the device clinic.    October 27, 2021 at 2:00 pm at the [facility] office  Your physician wants you to follow-up in: one year with Cristopher Peru, MD or one of the following Advanced Practice Providers on your designated Care Team:   Tommye Standard, [place] Legrand Como "Jonni Sanger" Chalmers Cater, [place]   Any Other Special Instructions Will Be Listed Below (If Applicable).  If you need a refill on your cardiac medications before your next appointment, please call your pharmacy.

## 2021-08-31 NOTE — Progress Notes (Signed)
Patient: [name]  Service Category: E/M  Provider: Gaspar Cola, MD  DOB: 02-07-1946  DOS: 09/01/2021  Referring Provider: Leonides Sake, MD  MRN: [id number]  Setting: Ambulatory outpatient  PCP: Leonides Sake, MD  Type: New Patient  Specialty: Interventional Pain Management    Location: Office  Delivery: Face-to-face     Primary Reason(s) for Visit: Encounter for initial evaluation of one or more chronic problems (new to examiner) potentially causing chronic pain, and posing a threat to normal musculoskeletal function. (Level of risk: High) CC: Back Pain, Neck Pain, and Knee Pain (Bilateral surgery)  HPI  Ms. Urquilla is a 75 y.o. year old, female patient, who comes for the first time to our practice referred by Hamrick, Lorin Mercy, MD for our initial evaluation of her chronic pain. She has Hypothyroidism; HYPERLIPIDEMIA; Essential hypertension; CARDIAC ARRHYTHMIA; CHF; Chronic systolic heart failure (HCC); IBS; Automatic implantable cardioverter-defibrillator in situ; Mitral regurgitation; Anxiety state, unspecified; Atrial fibrillation ([place]); Ventricular fibrillation ([place]); Scoliosis; Chronic shoulder pain (Right); Chronic knee pain (3ry area of Pain) (Bilateral); Acute non-recurrent pansinusitis; Acute CHF (congestive heart failure) ([place]); Macrocytic anemia; Toe injury, left, sequela; Acute respiratory failure with hypoxia ([place]); Chronic pain syndrome; Pharmacologic therapy; Disorder of skeletal system; Problems influencing health status; Encounter for chronic pain management; DDD (degenerative disc disease), cervical; Cervical facet osteoarthritis (Bilateral) (R>L); Cervical facet arthropathy (Bilateral) (R>L); Cervical foraminal stenosis (Right); Other unilateral secondary osteoarthritis of knee (Right); Calcium pyrophosphate deposition disease (CPPD); Osteoarthritis of knees (Bilateral); Osteoporosis, post-menopausal; Chronic low back pain (1ry area of Pain) (Bilateral) (R>L) w/o  sciatica; Chronic shoulder pain (Bilateral); Chronic upper back pain; Chronic lower extremity pain (2ry area of Pain) (Bilateral) (R>L); Chronic hip pain (4th area of Pain) (Bilateral) (R>L); Chronic neck and back pain; Cervicalgia; Cervicogenic headache; Occipital headache; Vertigo; Balance problems; At high risk for falls; Ear pain, bilateral; and Cervical facet syndrome on their problem list. Today she comes in for evaluation of her Back Pain, Neck Pain, and Knee Pain (Bilateral surgery)  Pain Assessment: Location: Lower Back Radiating: buttocks bilateral down back of legs above Onset: More than a month ago Duration: Chronic pain Quality: Aching, Constant, Shooting (jabbing) Severity: 10-Worst pain ever/10 (subjective, self-reported pain score)  Effect on ADL: prolonged walking and standing, bending, lifting, twisting Timing: Constant Modifying factors: heat,cold BP: 93/81  HR: 64  Onset and Duration: Present longer than 3 months Cause of pain: Unknown Severity: No change since onset, NAS-11 at its worse: 10/10, NAS-11 at its best: 6/10, NAS-11 now: 10/10, and NAS-11 on the average: 10/10 Timing: Morning and Night Aggravating Factors: Bending, Kneeling, Lifiting, Prolonged sitting, Prolonged standing, and Working Alleviating Factors: Resting and Warm showers or baths Associated Problems: Dizziness, Fatigue, Inability to concentrate, Inability to control bladder (urine), Numbness, Sadness, Sweating, Pain that wakes patient up, and Pain that does not allow patient to sleep Quality of Pain: Aching, Annoying, Exhausting, Fearful, Shooting, Tender, and Tingling Previous Examinations or Tests: X-rays and Orthopedic evaluation Previous Treatments: Epidural steroid injections  According to the patient the primary area of pain is that of the lower back (Bilateral) (R>L).  The patient denies any back surgeries, recent x-rays, nerve blocks, or physical therapy.  The patient's secondary area pain  is that of the lower extremities (Bilateral) (R>L).  The patient denies any surgeries, physical therapy, she does admit having had some x-rays done at her orthopedic surgeons office (Dr. Estell Harpin).  Official reports are not available at this time.  According to the patient the  right lower extremity pain goes down to her calf region through the front and back of the leg.  She describes having intermittent weakness of the lower extremities, tingling, numbness, and swelling, especially of the knees.  The tingling and numbness it appears to affect her feet, but it is only intermittent.  In the case of the left lower extremity the pattern is the same, but not as intense as in the right lower extremity.  The patient's third area pain is that of the knees (Bilateral) (right equal left).  The patient denies any knee surgeries and does admit to having had some x-rays at [facility], but the official reports are not available.  She denies any physical therapy and does admit having had some intra-articular injections by Dr. Sharlet Salina.  The patient's fourth area pain is that of the hips (Bilateral) (R>L).  She denies any hip surgery, recent x-rays, physical therapy, or any type of nerve blocks or joint injections of the hips.  The patient's fifth area pain is that of the neck (Bilateral) (R>L).  She denies any neck surgery, recent x-rays, physical therapy, or nerve blocks.  She indicates having had some physical therapy in the neck region when she was in her 20s.  She is currently 75 years old.  She also indicated having used a TENS unit, which she can not use any longer secondary to the fact that she had a cardiac defibrillator implanted.  The patient refers that the neck pain seems to refer pain towards the upper back, shoulders, and the back of the head.  The patient's sixth area pain is that of the shoulders (Bilateral) (right equals left).  She denies any shoulder surgeries, recent x-rays, or physical  therapy.  However she does indicate having had a right shoulder injection done by Dr. Sharlet Salina which apparently did help.  The patient's seventh area pain is that of the headaches.  She describes having frontal headaches, but more commonly she has them in the occipital region associated with the neck pain.  Today's physical exam was limited secondary to the fact that the patient was experiencing vertigo and balance problems.  She indicates that she has been having that since she started having ear pain.  I asked her if she had notified her primary care physician and she indicated that she has an upcoming appointment where she is planning to talk to her about that.  Pharmacotherapy: The patient is taking gabapentin (Neurontin) 200 mg p.o. at bedtime.  In addition to the above, the patient has multiple medical problems including depression, anxiety, panic attacks, and cardiovascular problems that required for the patient to have a cardiac defibrillator implanted.  Today I took the time to provide the patient with information regarding my pain practice. The patient was informed that my practice is divided into two sections: an interventional pain management section, as well as a completely separate and distinct medication management section. I explained that I have procedure days for my interventional therapies, and evaluation days for follow-ups and medication management. Because of the amount of documentation required during both, they are kept separated. This means that there is the possibility that she may be scheduled for a procedure on one day, and medication management the next. I have also informed her that because of staffing and facility limitations, I no longer take patients for medication management only. To illustrate the reasons for this, I gave the patient the example of surgeons, and how inappropriate it would be to refer a patient to  his/her care, just to write for the post-surgical  antibiotics on a surgery done by a different surgeon.   Because interventional pain management is my board-certified specialty, the patient was informed that joining my practice means that they are open to any and all interventional therapies. I made it clear that this does not mean that they will be forced to have any procedures done. What this means is that I believe interventional therapies to be essential part of the diagnosis and proper management of chronic pain conditions. Therefore, patients not interested in these interventional alternatives will be better served under the care of a different practitioner.  The patient was also made aware of my Comprehensive Pain Management Safety Guidelines where by joining my practice, they limit all of their nerve blocks and joint injections to those done by our practice, for as long as we are retained to manage their care.   Historic Controlled Substance Pharmacotherapy Review  PMP and historical list of controlled substances: Hydrocodone/APAP 5/325 (#8), 1 tab p.o. 4 times daily (last filled on 12/10/2020) Current opioid analgesics: .   None MME/day: 0 mg/day   Historical Monitoring: The patient  reports no history of drug use. List of all UDS Test(s): Lab Results  Component Value Date   COCAINSCRNUR NONE DETECTED 08/26/2012   THCU NONE DETECTED 08/26/2012   ETH <11 08/26/2012   List of other Serum/Urine Drug Screening Test(s):  Lab Results  Component Value Date   COCAINSCRNUR NONE DETECTED 08/26/2012   THCU NONE DETECTED 08/26/2012   ETH <11 08/26/2012   Historical Background Evaluation: [place] PMP: PDMP reviewed during this encounter. Online review of the past 7-month period conducted.             PMP NARX Score Report:  Narcotic: 040 Sedative: 020 Stimulant: 000 [place] Department of public safety, offender search: Engineer, mining Information) Non-contributory Risk Assessment Profile: Aberrant behavior: None observed or detected today Risk factors  for fatal opioid overdose: None identified today PMP NARX Overdose Risk Score: 190 Fatal overdose hazard ratio (HR): Calculation deferred Non-fatal overdose hazard ratio (HR): Calculation deferred Risk of opioid abuse or dependence: 0.7-3.0% with doses ? 36 MME/day and 6.1-26% with doses ? 120 MME/day. Substance use disorder (SUD) risk level: See below Personal History of Substance Abuse (SUD-Substance use disorder):  Alcohol: Negative  Illegal Drugs: Negative  Rx Drugs: Negative  ORT Risk Level calculation: Low Risk  Opioid Risk Tool - 09/01/21 1328       Personal History of Substance Abuse   Alcohol Negative    Illegal Drugs Negative    Rx Drugs Negative      History of Preadolescent Sexual Abuse   History of Preadolescent Sexual Abuse Negative or Female      Psychological Disease   Psychological Disease Positive    ADD Negative    OCD Negative    Bipolar Negative    Schizophrenia Negative    Depression Positive      Total Score   Opioid Risk Tool Scoring 3    Opioid Risk Interpretation Low Risk            ORT Scoring interpretation table:  Score <3 = Low Risk for SUD  Score between 4-7 = Moderate Risk for SUD  Score >8 = High Risk for Opioid Abuse   PHQ-2 Depression Scale:  Total score: 2  PHQ-2 Scoring interpretation table: (Score and probability of major depressive disorder)  Score 0 = No depression  Score 1 = 15.4% Probability  Score 2 = 21.1% Probability  Score 3 = 38.4% Probability  Score 4 = 45.5% Probability  Score 5 = 56.4% Probability  Score 6 = 78.6% Probability   PHQ-9 Depression Scale:  Total score: 7  PHQ-9 Scoring interpretation table:  Score 0-4 = No depression  Score 5-9 = Mild depression  Score 10-14 = Moderate depression  Score 15-19 = Moderately severe depression  Score 20-27 = Severe depression (2.4 times higher risk of SUD and 2.89 times higher risk of overuse)   Pharmacologic Plan: As per protocol, I have not taken over any  controlled substance management, pending the results of ordered tests and/or consults.            Initial impression: Pending review of available data and ordered tests.  Meds   Current Outpatient Medications:    acetaminophen (TYLENOL) 500 MG tablet, Take 1,000 mg by mouth every 6 (six) hours as needed (back/neck pain). For pain, Disp: , Rfl:    amiodarone (PACERONE) 200 MG tablet, Take amiodarone 200 mg one tablet daily Monday through Saturday.  Do not take amiodarone on Sunday. (Patient taking differently: Take 200 mg by mouth every Monday, Tuesday, Wednesday, Thursday, and Friday. In the morning), Disp: 60 tablet, Rfl: 1   aspirin EC 81 MG tablet, Take 81 mg by mouth every other day. Swallow whole., Disp: , Rfl:    aspirin-acetaminophen-caffeine (EXCEDRIN MIGRAINE) 250-250-65 MG per tablet, Take 1 tablet by mouth every 6 (six) hours as needed for headache., Disp: , Rfl:    Aspirin-Salicylamide-Caffeine (BC HEADACHE POWDER PO), Take 1 Package by mouth 2 (two) times daily as needed (headache/pain)., Disp: , Rfl:    carvedilol (COREG) 3.125 MG tablet, Take 1 tablet (3.125 mg total) by mouth 2 (two) times daily with a meal., Disp: 60 tablet, Rfl: 2   ENTRESTO 97-103 MG, Take 1 tablet by mouth 2 (two) times daily., Disp: , Rfl:    gabapentin (NEURONTIN) 100 MG capsule, Take 1 capsule (100 mg total) by mouth at bedtime. (Patient taking differently: Take 200 mg by mouth at bedtime.), Disp: 30 capsule, Rfl: 2   Glycerin-Hypromellose-PEG 400 (VISINE DRY EYE) 0.2-0.2-1 % SOLN, Place 1-2 drops into both eyes 3 (three) times daily as needed (dry/irritated eyes)., Disp: , Rfl:    hydrOXYzine (ATARAX/VISTARIL) 10 MG tablet, Take 10 mg by mouth at bedtime as needed for itching (allergies)., Disp: , Rfl:    levothyroxine (SYNTHROID) 100 MCG tablet, Take 100 mcg by mouth daily before breakfast., Disp: , Rfl:    Multiple Vitamin (MULTIVITAMIN WITH MINERALS) TABS tablet, Take 1 tablet by mouth daily., Disp: 100  tablet, Rfl: 0   PARoxetine (PAXIL) 30 MG tablet, Take 60 mg by mouth every evening., Disp: , Rfl:    Polyvinyl Alcohol-Povidone (MURINE TEARS FOR DRY EYES OP), Place 2 drops into both eyes 2 (two) times daily as needed. For dry eyes, Disp: , Rfl:    pravastatin (PRAVACHOL) 40 MG tablet, TAKE 1 TABLET BY MOUTH EVERY EVENING., Disp: 90 tablet, Rfl: 3   sodium chloride (OCEAN) 0.65 % SOLN nasal spray, Place 1 spray into both nostrils as needed (dry nasal passages)., Disp: , Rfl:    spironolactone (ALDACTONE) 25 MG tablet, Take 0.5 tablets (12.5 mg total) by mouth daily., Disp: 30 tablet, Rfl: 2   vitamin B-12 1000 MCG tablet, Take 1 tablet (1,000 mcg total) by mouth daily., Disp: 100 tablet, Rfl: 0  Imaging Review  Cervical Imaging: Cervical MR wo contrast: Results for orders placed in  visit on 01/20/02 MR Cervical Spine Wo Contrast  Narrative FINDINGS CLINICAL DATA:  SEVERE NECK PAIN.  SCOLIOSIS.  RIGHT NECK, SHOULDER, AND RIGHT ARM PAIN. MRI OF THE CERVICAL SPINE WITHOUT CONTRAST PERFORMED ON THE OPEN MAGNET NO PRIOR STUDIES AND NO X-RAYS ARE AVAILABLE FOR CORRELATION. THERE IS MODERATE SCOLIOSIS CONVEXED TO THE LEFT.  THERE IS MILD MOTION ARTIFACT ON THE STUDY DEGRADING IMAGE QUALITY. THE CERVICAL ALIGNMENT IS NORMAL.  NO CORD SIGNAL ABNORMALITY IS IDENTIFIED.  NEGATIVE FOR FRACTURE. C3-4:   THERE IS MILD FACET ARTHROPATHY BILATERALLY, RIGHT GREATER THAN LEFT AND MILD FORAMINAL NARROWING, RIGHT GREATER THAN LEFT. C4-5:  THERE IS BILATERAL MILD FACET ARTHROPATHY.  THERE IS VERY MILD DISK DEGENERATION WITHOUT SPINAL STENOSIS. C5-6:  THERE IS MILD DISK DEGENERATION AND MILD DISK BULGING.  THERE IS MILD FACET ARTHROPATHY BILATERALLY.   NEURAL FORAMINA APPEAR PATENT. C6-7:  NEGATIVE. IMPRESSION 1)    NEGATIVE FOR DISK HERNIATION OR SPINAL STENOSIS. 2)    SCOLIOSIS AND MILD MID CERVICAL FACET ARTHROPATHY.  Cervical CT wo contrast: Results for orders placed during the hospital encounter of  10/31/18 CT Cervical Spine Wo Contrast  Narrative CLINICAL DATA:  Mechanical fall 2 days ago, slipped on steps outside landing on back, worsening back pain  EXAM: CT HEAD WITHOUT CONTRAST  CT CERVICAL SPINE WITHOUT CONTRAST  TECHNIQUE: Multidetector CT imaging of the head and cervical spine was performed following the standard protocol without intravenous contrast. Multiplanar CT image reconstructions of the cervical spine were also generated.  COMPARISON:  None  FINDINGS: CT HEAD FINDINGS  Brain: Mild generalized atrophy. Normal ventricular morphology. No midline shift or mass effect. Minimal small vessel chronic ischemic changes of deep cerebral white matter. No intracranial hemorrhage, mass lesion or evidence of acute infarction. No extra-axial fluid collections.  Vascular: No hyperdense vessels. Mild atherosclerotic calcifications of internal carotid arteries bilaterally at skull base  Skull: Intact. 11 x 12 mm focus of osseous thickening and ground-glass attenuation at the anterior aspect of the medial wall of the RIGHT maxillary sinus question fibrous dysplasia.  Sinuses/Orbits: Clear  Other: N/A  CT CERVICAL SPINE FINDINGS  Alignment: Grossly normal.  Minimal motion artifacts noted.  Skull base and vertebrae: Osseous demineralization. Skull base intact. Vertebral body heights maintained. Minimal disc space narrowing and endplate spur formation at C4-C5 and C5-C6. Scattered facet degenerative changes greater on RIGHT. No acute fracture, subluxation, or bone destruction.  Soft tissues and spinal canal: Prevertebral soft tissues normal thickness.  Disc levels:  No additional abnormalities  Upper chest: Calcified granuloma RIGHT upper lobe. Lung apices otherwise clear.  Other: N/A  IMPRESSION: Atrophy with minimal small vessel chronic ischemic changes of deep cerebral white matter.  No acute intracranial abnormalities.  Degenerative disc and facet  disease changes of the cervical spine.  No acute cervical spine abnormalities.   Electronically Signed By: Lavonia Dana M.D. On: 10/31/2018 17:14  Cervical DG complete: Results for orders placed during the hospital encounter of 12/03/09 DG Cervical Spine Complete  Narrative Clinical Data: Neck pain radiating to the shoulders and arms.  CERVICAL SPINE - COMPLETE 4+ VIEW  Comparison: None  Findings: There is scoliosis convex to the left.  There is mid cervical facet arthropathy, most severe on the right at C5-6 where there is anterolisthesis of 3 mm.  Lesser degenerative changes are present at C3-4 and C4-5.  There is disc space narrowing at C5-6 and C6-7.  There is some foraminal narrowing on the right at C3-4, C4-5 and C5-6. No  identifiably acute finding.  IMPRESSION: Scoliosis convex to the left.  Facet arthropathy most pronounced on the right at C3-4, C4-5 and C5-6.  Anterolisthesis at at C5-6 of 3 mm.  Some foraminal encroachment on the right by osteophytes.  Provider: Alfredo Batty, Candice Demaegd  Shoulder Imaging: Shoulder-R DG: Results for orders placed during the hospital encounter of 06/21/16 DG Shoulder Right  Narrative CLINICAL DATA:  Right shoulder pain for several weeks. No known injury.  EXAM: RIGHT SHOULDER - 2+ VIEW  COMPARISON:  04/06/2016  FINDINGS: There is no evidence of fracture or dislocation. There is no evidence of arthropathy or other focal bone abnormality involving the shoulder.  Old fracture deformities are again seen involving the right lateral second and third ribs. AICD noted.  IMPRESSION: No acute findings. Several old right rib fracture deformities again noted.   Electronically Signed By: Earle Gell M.D. On: 06/22/2016 08:45  Thoracic Imaging: Thoracic DG 2-3 views: Results for orders placed during the hospital encounter of 10/31/18 DG Thoracic Spine 2 View  Narrative CLINICAL DATA:  75 year old female with  progressive back pain after falling on the steps outside 2 days previously.  EXAM: THORACIC SPINE 2 VIEWS  COMPARISON:  Concurrently obtained radiographs of the lumbar spine  FINDINGS: Right subclavian approach intracardiac defibrillator. Cardiomegaly. Surgical changes of prior mitral valve annuloplasty. No evidence of pulmonary edema, pleural effusion or pneumothorax. No evidence of acute fracture or malalignment.  IMPRESSION: 1. No evidence of acute thoracic spine fracture or malalignment.   Electronically Signed By: Jacqulynn Cadet M.D. On: 10/31/2018 17:23  Lumbosacral Imaging: Lumbar DG 2-3 views: Results for orders placed during the hospital encounter of 10/31/18 DG Lumbar Spine 2-3 Views  Narrative CLINICAL DATA:  75 year old female with progressive back pain after slipping and falling on her outside steps 2 days previously.  EXAM: LUMBAR SPINE - 2-3 VIEW  COMPARISON:  Concurrently obtained radiographs of the thoracic spine  FINDINGS: There is no evidence of acute fracture or malalignment. The vertebral body heights are maintained. Minimal digits of degenerative disc disease. The intervertebral disc spaces are largely maintained. The visualized sacrum and iliac bones appear to be intact. No evidence of bowel obstruction. No focal bony lesion.  IMPRESSION: Negative.   Electronically Signed By: Jacqulynn Cadet M.D. On: 10/31/2018 17:24  Lumbar DG (Complete) 4+V: Results for orders placed during the hospital encounter of 04/06/16 DG Lumbar Spine Complete  Narrative CLINICAL DATA:  Pt fell 5 days ago, lower back pain  EXAM: LUMBAR SPINE - COMPLETE 4+ VIEW  COMPARISON:  Abdomen 08/26/2012  FINDINGS: There is no evidence of lumbar spine fracture. Alignment is normal. Intervertebral disc spaces are maintained. Small anterior endplate spurs H0-Q6.  IMPRESSION: No acute abnormality   Electronically Signed By: Lucrezia Europe M.D. On: 04/06/2016  20:52  Knee Imaging: Knee-R DG 1-2 views: Results for orders placed during the hospital encounter of 07/01/10 DG Knee 2 Views Right  Narrative Clinical Data: Chronic bilateral knee pain, left greater than right, recently worsened.  RIGHT KNEE - 1-2 VIEW 07/01/2010:  Comparison: None.  Findings: Chondrocalcinosis involving the lateral and medial menisci.  Mild tricompartment joint space narrowing and associated hypertrophic spurring.  No other intrinsic osseous abnormality.  No evidence of a significant joint effusion.  IMPRESSION: Mild osteoarthritis secondary to CPPD.  Provider: Candice Demaegd  Knee-L DG 1-2 views: Results for orders placed during the hospital encounter of 07/01/10 DG Knee 2 Views Left  Narrative Clinical Data: Chronic bilateral knee pain, left greater  than right, recently worsened.  LEFT KNEE - 1-2 VIEW 07/01/2010:  Comparison: None.  Findings: Mild joint space narrowing involving the medial and patellofemoral compartments.  Mild hypertrophic spurring involving the medial compartment.  No other intrinsic osseous abnormality. Relatively well-preserved bone mineral density.  No evidence of a significant joint effusion.  IMPRESSION: Mild osteoarthritis involving the medial and patellofemoral compartments.  Provider: Candice Demaegd  Knee-R DG 4 views: Results for orders placed during the hospital encounter of 06/21/16 DG Knee Complete 4 Views Right  Narrative CLINICAL DATA:  Persistent pain  EXAM: RIGHT KNEE - COMPLETE 4+ VIEW  COMPARISON:  July 01, 2010  FINDINGS: Weightbearing frontal, weight-bearing tunnel, weight-bearing lateral, and sunrise patellar images were obtained. There is no fracture or dislocation. There is no joint effusion. There is generalized joint space narrowing. There is spurring in all compartments, most marked laterally. There is extensive chondrocalcinosis.  IMPRESSION: Generalized osteoarthritic change  with slight progression since prior study. Extensive chondrocalcinosis may be seen with osteoarthritis but also may be seen with calcium pyrophosphate deposition disease. No fracture or joint effusion evident.   Electronically Signed By: Lowella Grip III M.D. On: 06/22/2016 08:42  Elbow Imaging: Elbow-R DG Complete: Results for orders placed during the hospital encounter of 02/22/14 DG Elbow Complete Right  Narrative CLINICAL DATA:  Fall, wrist fracture, elbow pain  EXAM: RIGHT ELBOW - COMPLETE 3+ VIEW  COMPARISON:  DG WRIST COMPLETE*R* dated 02/23/2014  FINDINGS: The study is moderately limited by the presence of the cast as well as by related limited positioning. There is no evidence of dislocation or joint effusion at the with joint. No displaced fractures identified. Mild irregularity of the radial neck may be positional. No evidence of humeral fracture. Proximal ulna not well evaluated.  IMPRESSION: Study is limited for multiple reasons as described above. No evidence of distal humeral fracture. Radius and ulna not well evaluated. No displaced fractures or elbow dislocation.   Electronically Signed By: Skipper Cliche M.D. On: 02/23/2014 07:43  Wrist Imaging: Wrist-R DG Complete: Results for orders placed during the hospital encounter of 02/22/14 DG Wrist Complete Right  Narrative CLINICAL DATA:  Fall with wrist deformity  EXAM: RIGHT WRIST - COMPLETE 3+ VIEW  COMPARISON:  None.  FINDINGS: Acute transverse fracture through the distal radius, likely with intra-articular extension along the lateral articular surface. No visible offset along the articular contour. There is dorsal impaction causing dorsal tilting of the wrist. Acquired ulnar positive variance. Ulnar styloid process avulsion fracture. Normally aligned carpus. Osteopenia.  IMPRESSION: Acute distal radius and ulna fractures as above.   Electronically Signed By: Jorje Guild  M.D. On: 02/23/2014 01:28  Hand Imaging: Hand-L DG Complete: Results for orders placed in visit on 10/10/01 DG Hand Complete Left  Narrative FINDINGS CLINICAL DATA:   FELL.  INJURY TO LEFT HAND. LEFT HAND COMPLETE THREE VIEWS OF THE LEFT HAND SHOW A FRACTURE AT THE BASE OF THE PROXIMAL PHALANX OF THE LEFT FIFTH FINGER.  THERE IS MODERATE ANGULATION OF THE FRACTURE AND IT MAY EXTEND INTO THE JOINT SPACE.  NO FOREIGN BODY IS SEEN.  NO OTHER FRACTURE IS IDENTIFIED. IMPRESSION FRACTURE, BASE, PROXIMAL PHALANX, LEFT FIFTH FINGER, WITH SOME ANGULATION AND POSSIBLE EXTENSION INTO THE JOINT SPACE.  Complexity Note: Imaging results reviewed. Results shared with Ms. Woodson, using Layman's terms.                         ROS  Cardiovascular: Heart trouble,  High blood pressure, Heart surgery, Pacemaker or defibrillator, Weak heart (CHF), and Heart murmur Pulmonary or Respiratory: Snoring  and Coughing up mucus (Bronchitis) Neurological: Curved spine Psychological-Psychiatric: Depressed, Prone to panicking, and Difficulty sleeping and or falling asleep Gastrointestinal: No reported gastrointestinal signs or symptoms such as vomiting or evacuating blood, reflux, heartburn, alternating episodes of diarrhea and constipation, inflamed or scarred liver, or pancreas or irrregular and/or infrequent bowel movements Genitourinary: No reported renal or genitourinary signs or symptoms such as difficulty voiding or producing urine, peeing blood, non-functioning kidney, kidney stones, difficulty emptying the bladder, difficulty controlling the flow of urine, or chronic kidney disease Hematological: Brusing easily and Bleeding easily. Endocrine: Slow thyroid Rheumatologic: Rheumatoid arthritis and Constant unexplained fatigue (Chronic Fatigue Syndrome) Musculoskeletal: Negative for myasthenia gravis, muscular dystrophy, multiple sclerosis or malignant hyperthermia Work History: Retired  Allergies  Ms. Kuwahara  is allergic to morphine and related, codeine, demerol [meperidine hcl], meperidine hcl, and pollen extract.  Laboratory Chemistry Profile   Renal Lab Results  Component Value Date   BUN 12 01/24/2021   CREATININE 1.04 (H) 01/24/2021   GFRAA 60 (L) 02/23/2014   GFRNONAA 57 (L) 01/24/2021   PROTEINUR 30 (A) 06/14/2016     Electrolytes Lab Results  Component Value Date   NA 132 (L) 01/24/2021   K 3.7 01/24/2021   CL 94 (L) 01/24/2021   CALCIUM 8.5 (L) 01/24/2021   MG 2.1 01/24/2021   PHOS 4.2 01/24/2021     Hepatic Lab Results  Component Value Date   AST 37 05/24/2013   ALT 20 05/24/2013   ALBUMIN 4.2 05/24/2013   ALKPHOS 86 05/24/2013   LIPASE 19 05/14/2013     ID Lab Results  Component Value Date   SARSCOV2NAA NEGATIVE 01/20/2021   STAPHAUREUS POSITIVE (A) 05/16/2013   MRSAPCR NEGATIVE 05/16/2013     Bone No results found for: VD25OH, [id number], RK2706CB7, SE8315VV6, 25OHVITD1, 25OHVITD2, 25OHVITD3, TESTOFREE, TESTOSTERONE   Endocrine Lab Results  Component Value Date   GLUCOSE 93 01/24/2021   GLUCOSEU NEGATIVE 06/14/2016   HGBA1C 5.1 05/17/2013   TSH 20.415 (H) 01/21/2021   FREET4 1.55 05/15/2013   CRTSLPL  06/30/2008    19.5 (NOTE)  AM:  4.3 - 22.4 ug/dL PM:  3.1 - 16.7 ug/dL     Neuropathy Lab Results  Component Value Date   VITAMINB12 91 (L) 01/21/2021   FOLATE 16.5 01/21/2021   HGBA1C 5.1 05/17/2013     CNS No results found for: COLORCSF, APPEARCSF, RBCCOUNTCSF, WBCCSF, POLYSCSF, LYMPHSCSF, EOSCSF, PROTEINCSF, GLUCCSF, JCVIRUS, CSFOLI, IGGCSF, LABACHR, ACETBL, LABACHR, ACETBL   Inflammation (CRP: Acute  ESR: Chronic) Lab Results  Component Value Date   CRP 1.0 (H) 06/25/2008   ESRSEDRATE 30 (H) 06/26/2008   LATICACIDVEN 1.2 09/05/2009     Rheumatology No results found for: RF, ANA, LABURIC, URICUR, LYMEIGGIGMAB, LYMEABIGMQN, HLAB27   Coagulation Lab Results  Component Value Date   INR 0.97 02/23/2014   LABPROT 12.7 02/23/2014    APTT 29 05/24/2013   PLT 269 01/24/2021   DDIMER 1.34 (H) 05/15/2013     Cardiovascular Lab Results  Component Value Date   BNP >4,500.0 (H) 01/21/2021   CKTOTAL 117 09/04/2009   CKMB 8.7 (H) 09/04/2009   TROPONINI <0.30 05/15/2013   HGB 11.4 (L) 01/24/2021   HCT 33.1 (L) 01/24/2021     Screening Lab Results  Component Value Date   SARSCOV2NAA NEGATIVE 01/20/2021   STAPHAUREUS POSITIVE (A) 05/16/2013   MRSAPCR NEGATIVE 05/16/2013     Cancer  No results found for: CEA, CA125, LABCA2   Allergens No results found for: ALMOND, APPLE, ASPARAGUS, AVOCADO, BANANA, BARLEY, BASIL, BAYLEAF, GREENBEAN, LIMABEAN, WHITEBEAN, BEEFIGE, REDBEET, BLUEBERRY, BROCCOLI, CABBAGE, MELON, CARROT, CASEIN, CASHEWNUT, CAULIFLOWER, CELERY     Note: Lab results reviewed.  [place]  Drug: Ms. Ringel  reports no history of drug use. Alcohol:  reports no history of alcohol use. Tobacco:  reports that she has never smoked. She has never used smokeless tobacco. Medical:  has a past medical history of Anxiety, Arthritis, Automatic implantable cardioverter-defibrillator in situ, Cardiomegaly, CHF (congestive heart failure) ([place]) (05/2013), Chronic bronchitis ([place]), Depression, GERD (gastroesophageal reflux disease), Headache(784.0), Heart murmur, History of sudden cardiac arrest successfully resuscitated (2011), HLD (hyperlipidemia), Hypertension, Hypothyroidism, IBS (irritable bowel syndrome), Paroxysmal atrial fibrillation ([place]), Pneumonia, Scoliosis, and Severe mitral regurgitation (05/2013). Family: family history includes Arthritis in her mother; CAD in her mother and sister; CVA in her sister; Cancer - Other in her mother; Heart disease in her father; Seizures in her brother.  Past Surgical History:  Procedure Laterality Date   ABDOMINAL HYSTERECTOMY     APPENDECTOMY     CARDIAC CATHETERIZATION     CARDIAC DEFIBRILLATOR PLACEMENT  2010   Vfib arrest, single chamber ICD   CARPAL TUNNEL RELEASE Right 03/05/2014    Procedure: RIGHT CARPAL TUNNEL RELEASE;  Surgeon: Schuyler Amor, MD;  Location: [place];  Service: Orthopedics;  Laterality: Right;   CATARACT EXTRACTION W/PHACO Left 09/03/2020   Procedure: CATARACT EXTRACTION PHACO AND INTRAOCULAR LENS PLACEMENT ([place]) LEFT;  Surgeon: Birder Robson, MD;  Location: [facility];  Service: Ophthalmology;  Laterality: Left;  Lot # [id number] H [place]: 01:04.1 CDE:11:62    CATARACT EXTRACTION W/PHACO Right 12/31/2020   Procedure: CATARACT EXTRACTION PHACO AND INTRAOCULAR LENS PLACEMENT ([facility]) RIGHT;  Surgeon: Birder Robson, MD;  Location: [facility];  Service: Ophthalmology;  Laterality: Right;  Lot #[id number] H [place]: 00:55.8 CDE: 6.29   COLONOSCOPY     FRACTURE SURGERY     HERNIA REPAIR  as child   x 2   INTRAOPERATIVE TRANSESOPHAGEAL ECHOCARDIOGRAM N/A 05/24/2013   Procedure: INTRAOPERATIVE TRANSESOPHAGEAL ECHOCARDIOGRAM;  Surgeon: Ivin Poot, MD;  Location: [place];  Service: Open Heart Surgery;  Laterality: N/A;   LEFT HEART CATHETERIZATION WITH CORONARY ANGIOGRAM N/A 05/16/2013   Procedure: LEFT HEART CATHETERIZATION WITH CORONARY ANGIOGRAM;  Surgeon: Clent Demark, MD;  Location: [place] [facility];  Service: Cardiovascular;  Laterality: N/A;   MITRAL VALVE REPAIR N/A 05/24/2013   Surgeon: Ivin Poot, MD; Service: Open Heart Surgery   MULTIPLE EXTRACTIONS WITH ALVEOLOPLASTY N/A 05/21/2013   Procedure: Extraction of tooth #'s 2,4,18 with alveoloplasty and gross debridement of remaining teeth;  Surgeon: Lenn Cal, DDS;  Location: [place];  Service: Oral Surgery;  Laterality: N/A;   OPEN REDUCTION INTERNAL FIXATION (ORIF) DISTAL RADIAL FRACTURE Right 03/05/2014   Procedure: OPEN REDUCTION INTERNAL FIXATION (ORIF) RIGHT DISTAL RADIUS FRACTURE ;  Surgeon: Schuyler Amor, MD;  Location: [place];  Service: Orthopedics;  Laterality: Right;   TEE WITHOUT CARDIOVERSION  09/24/2012   Procedure: TRANSESOPHAGEAL ECHOCARDIOGRAM (TEE);  Surgeon: Fay Records, MD;  Location:  [facility];  Service: Cardiovascular;  Laterality: N/A;   Active Ambulatory Problems    Diagnosis Date Noted   Hypothyroidism 12/28/2009   HYPERLIPIDEMIA 12/28/2009   Essential hypertension 12/28/2009   CARDIAC ARRHYTHMIA 12/28/2009   CHF 27/12/5007   Chronic systolic heart failure ([place]) 04/20/2010   IBS 12/28/2009   Automatic implantable cardioverter-defibrillator in situ 04/20/2010   Mitral  regurgitation 09/20/2012   Anxiety state, unspecified 05/15/2013   Atrial fibrillation ([place]) 05/15/2013   Ventricular fibrillation ([place]) 11/03/2014   Scoliosis 02/04/2015   Chronic shoulder pain (Right) 06/21/2016   Chronic knee pain (3ry area of Pain) (Bilateral) 06/21/2016   Acute non-recurrent pansinusitis 10/03/2016   Acute CHF (congestive heart failure) ([place]) 01/21/2021   Macrocytic anemia 01/21/2021   Toe injury, left, sequela 01/21/2021   Acute respiratory failure with hypoxia (HCC) 01/21/2021   Chronic pain syndrome 09/01/2021   Pharmacologic therapy 09/01/2021   Disorder of skeletal system 09/01/2021   Problems influencing health status 09/01/2021   Encounter for chronic pain management 09/01/2021   DDD (degenerative disc disease), cervical 09/01/2021   Cervical facet osteoarthritis (Bilateral) (R>L) 09/01/2021   Cervical facet arthropathy (Bilateral) (R>L) 09/01/2021   Cervical foraminal stenosis (Right) 09/01/2021   Other unilateral secondary osteoarthritis of knee (Right) 09/01/2021   Calcium pyrophosphate deposition disease (CPPD) 09/01/2021   Osteoarthritis of knees (Bilateral) 09/01/2021   Osteoporosis, post-menopausal 09/01/2021   Chronic low back pain (1ry area of Pain) (Bilateral) (R>L) w/o sciatica 09/01/2021   Chronic shoulder pain (Bilateral) 09/01/2021   Chronic upper back pain 09/01/2021   Chronic lower extremity pain (2ry area of Pain) (Bilateral) (R>L) 09/01/2021   Chronic hip pain (4th area of Pain) (Bilateral) (R>L) 09/01/2021   Chronic neck and back pain  09/01/2021   Cervicalgia 09/01/2021   Cervicogenic headache 09/01/2021   Occipital headache 09/01/2021   Vertigo 09/01/2021   Balance problems 09/01/2021   At high risk for falls 09/01/2021   Ear pain, bilateral 09/01/2021   Cervical facet syndrome 09/01/2021   Resolved Ambulatory Problems    Diagnosis Date Noted   Retained dental root 05/21/2013   Past Medical History:  Diagnosis Date   Anxiety    Arthritis    Cardiomegaly    CHF (congestive heart failure) ([place]) 05/2013   Chronic bronchitis (HCC)    Depression    GERD (gastroesophageal reflux disease)    Headache(784.0)    Heart murmur    History of sudden cardiac arrest successfully resuscitated 2011   HLD (hyperlipidemia)    Hypertension    IBS (irritable bowel syndrome)    Paroxysmal atrial fibrillation ([place])    Pneumonia    Severe mitral regurgitation 05/2013   Constitutional Exam  General appearance: Well nourished, well developed, and well hydrated. In no apparent acute distress Vitals:   09/01/21 1311  BP: 93/81  Pulse: 64  Resp: 18  Temp: (!) 97 F (36.1 C)  SpO2: 96%  Weight: 160 lb (72.6 kg)  Height: 5' (1.524 m)   BMI Assessment: Estimated body mass index is 31.25 kg/m as calculated from the following:   Height as of this encounter: 5' (1.524 m).   Weight as of this encounter: 160 lb (72.6 kg).  BMI interpretation table: BMI level Category Range association with higher incidence of chronic pain  <18 kg/m2 Underweight   18.5-24.9 kg/m2 Ideal body weight   25-29.9 kg/m2 Overweight Increased incidence by 20%  30-34.9 kg/m2 Obese (Class I) Increased incidence by 68%  35-39.9 kg/m2 Severe obesity (Class II) Increased incidence by 136%  >40 kg/m2 Extreme obesity (Class III) Increased incidence by 254%   Patient's current BMI Ideal Body weight  Body mass index is 31.25 kg/m. Ideal body weight: 45.5 kg (100 lb 4.9 oz) Adjusted ideal body weight: 56.3 kg (124 lb 3 oz)   BMI Readings from Last 4  Encounters:  09/01/21 31.25 kg/m  06/22/21 31.17  kg/m  01/24/21 29.06 kg/m  12/24/20 28.73 kg/m   Wt Readings from Last 4 Encounters:  09/01/21 160 lb (72.6 kg)  06/22/21 159 lb 9.6 oz (72.4 kg)  01/24/21 169 lb 5 oz (76.8 kg)  12/24/20 170 lb (77.1 kg)    Psych/Mental status: Alert, oriented x 3 (person, place, & time)       Eyes: PERLA Respiratory: No evidence of acute respiratory distress  Assessment  Primary Diagnosis & Pertinent Problem List: The primary encounter diagnosis was Chronic pain syndrome. Diagnoses of Chronic low back pain (1ry area of Pain) (Bilateral) w/o sciatica, Chronic lower extremity pain (2ry area of Pain) (Bilateral) (R>L), Chronic knee pain (Bilateral), Chronic hip pain (4th area of Pain) (Bilateral) (R>L), Chronic shoulder pain (Bilateral), Cervicalgia, DDD (degenerative disc disease), cervical, Chronic neck and back pain, Cervical facet syndrome, Cervical facet osteoarthritis (Bilateral) (R>L), Cervical facet arthropathy (Bilateral) (R>L), Chronic upper back pain, Osteoarthritis of knees (Bilateral), Cervical foraminal stenosis (Right), Calcium pyrophosphate deposition disease (CPPD), Pharmacologic therapy, Disorder of skeletal system, Problems influencing health status, Encounter for chronic pain management, Toe injury, left, sequela, Cervicogenic headache, Occipital headache, Vertigo, Balance problems, At high risk for falls, and Ear pain, bilateral were also pertinent to this visit.  Visit Diagnosis (New problems to examiner): 1. Chronic pain syndrome   2. Chronic low back pain (1ry area of Pain) (Bilateral) w/o sciatica   3. Chronic lower extremity pain (2ry area of Pain) (Bilateral) (R>L)   4. Chronic knee pain (Bilateral)   5. Chronic hip pain (4th area of Pain) (Bilateral) (R>L)   6. Chronic shoulder pain (Bilateral)   7. Cervicalgia   8. DDD (degenerative disc disease), cervical   9. Chronic neck and back pain   10. Cervical facet syndrome    11. Cervical facet osteoarthritis (Bilateral) (R>L)   12. Cervical facet arthropathy (Bilateral) (R>L)   13. Chronic upper back pain   14. Osteoarthritis of knees (Bilateral)   15. Cervical foraminal stenosis (Right)   16. Calcium pyrophosphate deposition disease (CPPD)   17. Pharmacologic therapy   18. Disorder of skeletal system   19. Problems influencing health status   20. Encounter for chronic pain management   21. Toe injury, left, sequela   22. Cervicogenic headache   23. Occipital headache   24. Vertigo   25. Balance problems   26. At high risk for falls   27. Ear pain, bilateral    Plan of Care (Initial workup plan)  Note: Ms. Vallin was reminded that as per protocol, today's visit has been an evaluation only. We have not taken over the patient's controlled substance management.  Problem-specific plan: No problem-specific Assessment & Plan notes found for this encounter. Lab Orders         Compliance Drug Analysis, Ur         Comp. Metabolic Panel (12)         Magnesium         Vitamin B12         Sedimentation rate         25-Hydroxy vitamin D Lcms D2+D3         C-reactive protein     Imaging Orders         DG Cervical Spine Complete         DG Thoracic Spine 4V         DG Lumbar Spine Complete W/Bend         DG Knee Complete 4 Views Right  DG Knee Complete 4 Views Left         DG Shoulder Right         DG Shoulder Left         DG HIP UNILAT W OR W/O PELVIS 2-3 VIEWS RIGHT         DG HIP UNILAT W OR W/O PELVIS 2-3 VIEWS LEFT     Referral Orders  No referral(s) requested today   Procedure Orders    No procedure(s) ordered today   Pharmacotherapy (current): Medications ordered:  No orders of the defined types were placed in this encounter.  Medications administered during this visit: Abran Cantor had no medications administered during this visit.   Pharmacological management options:  Opioid Analgesics: The patient was informed that there is  no guarantee that she would be a candidate for opioid analgesics. The decision will be made following CDC guidelines. This decision will be based on the results of diagnostic studies, as well as Ms. Thorson's risk profile.   Membrane stabilizer: To be determined at a later time  Muscle relaxant: To be determined at a later time  NSAID: To be determined at a later time  Other analgesic(s): To be determined at a later time   Interventional management options: Ms. Dickison was informed that there is no guarantee that she would be a candidate for interventional therapies. The decision will be based on the results of diagnostic studies, as well as Ms. Hickle's risk profile.  Procedure(s) under consideration:  Pending results of ordered studies      Interventional Therapies  Risk  Complexity Considerations:   Estimated body mass index is 31.25 kg/m as calculated from the following:   Height as of this encounter: 5' (1.524 m).   Weight as of this encounter: 160 lb (72.6 kg). WNL   Planned  Pending:   Pending further evaluation   Under consideration:   Pending further evaluation   Completed:   None at this time   Therapeutic  Palliative (PRN) options:   None established    Provider-requested follow-up: Return for (3min), Eval-day (M,W), (F2F), 2nd Visit, for review of ordered tests.  Future Appointments  Date Time Provider [place]  10/27/2021  2:00 PM [facility] 1 [facility]    Note by: Gaspar Cola, MD Date: 09/01/2021; Time: 2:25 PM

## 2021-09-01 ENCOUNTER — Ambulatory Visit
Admission: RE | Admit: 2021-09-01 | Discharge: 2021-09-01 | Disposition: A | Discharge status: Home / Self Care | Payer: Medicare Other | Source: Ambulatory Visit | Attending: Pain Medicine | Admitting: Pain Medicine

## 2021-09-01 ENCOUNTER — Other Ambulatory Visit: Payer: Self-pay | Admitting: Pain Medicine

## 2021-09-01 ENCOUNTER — Encounter: Payer: Self-pay | Admitting: Pain Medicine

## 2021-09-01 ENCOUNTER — Ambulatory Visit
Admission: RE | Admit: 2021-09-01 | Discharge: 2021-09-01 | Disposition: A | Discharge status: Home / Self Care | Payer: Medicare Other | Attending: Pain Medicine | Admitting: Pain Medicine

## 2021-09-01 ENCOUNTER — Ambulatory Visit (HOSPITAL_BASED_OUTPATIENT_CLINIC_OR_DEPARTMENT_OTHER): Payer: Medicare Other | Admitting: Pain Medicine

## 2021-09-01 ENCOUNTER — Other Ambulatory Visit: Payer: Self-pay

## 2021-09-01 VITALS — BP 93/81 | HR 64 | Temp 97.0°F | Resp 18 | Ht 60.0 in | Wt 160.0 lb

## 2021-09-01 DIAGNOSIS — M25552 Pain in left hip: Secondary | ICD-10-CM | POA: Insufficient documentation

## 2021-09-01 DIAGNOSIS — M810 Age-related osteoporosis without current pathological fracture: Secondary | ICD-10-CM | POA: Insufficient documentation

## 2021-09-01 DIAGNOSIS — M542 Cervicalgia: Secondary | ICD-10-CM

## 2021-09-01 DIAGNOSIS — M5136 Other intervertebral disc degeneration, lumbar region: Secondary | ICD-10-CM | POA: Diagnosis not present

## 2021-09-01 DIAGNOSIS — M25512 Pain in left shoulder: Secondary | ICD-10-CM | POA: Insufficient documentation

## 2021-09-01 DIAGNOSIS — M545 Low back pain, unspecified: Secondary | ICD-10-CM

## 2021-09-01 DIAGNOSIS — M47812 Spondylosis without myelopathy or radiculopathy, cervical region: Secondary | ICD-10-CM | POA: Insufficient documentation

## 2021-09-01 DIAGNOSIS — M25551 Pain in right hip: Secondary | ICD-10-CM | POA: Insufficient documentation

## 2021-09-01 DIAGNOSIS — M1120 Other chondrocalcinosis, unspecified site: Secondary | ICD-10-CM | POA: Insufficient documentation

## 2021-09-01 DIAGNOSIS — R42 Dizziness and giddiness: Secondary | ICD-10-CM

## 2021-09-01 DIAGNOSIS — M25562 Pain in left knee: Secondary | ICD-10-CM | POA: Insufficient documentation

## 2021-09-01 DIAGNOSIS — M19012 Primary osteoarthritis, left shoulder: Secondary | ICD-10-CM | POA: Diagnosis not present

## 2021-09-01 DIAGNOSIS — M899 Disorder of bone, unspecified: Secondary | ICD-10-CM

## 2021-09-01 DIAGNOSIS — M170 Bilateral primary osteoarthritis of knee: Secondary | ICD-10-CM | POA: Insufficient documentation

## 2021-09-01 DIAGNOSIS — G894 Chronic pain syndrome: Secondary | ICD-10-CM | POA: Insufficient documentation

## 2021-09-01 DIAGNOSIS — Z79899 Other long term (current) drug therapy: Secondary | ICD-10-CM | POA: Insufficient documentation

## 2021-09-01 DIAGNOSIS — M549 Dorsalgia, unspecified: Secondary | ICD-10-CM

## 2021-09-01 DIAGNOSIS — H9203 Otalgia, bilateral: Secondary | ICD-10-CM | POA: Insufficient documentation

## 2021-09-01 DIAGNOSIS — M25511 Pain in right shoulder: Secondary | ICD-10-CM | POA: Insufficient documentation

## 2021-09-01 DIAGNOSIS — M1711 Unilateral primary osteoarthritis, right knee: Secondary | ICD-10-CM | POA: Diagnosis not present

## 2021-09-01 DIAGNOSIS — M79605 Pain in left leg: Secondary | ICD-10-CM | POA: Insufficient documentation

## 2021-09-01 DIAGNOSIS — M79604 Pain in right leg: Secondary | ICD-10-CM

## 2021-09-01 DIAGNOSIS — S99922S Unspecified injury of left foot, sequela: Secondary | ICD-10-CM

## 2021-09-01 DIAGNOSIS — M47816 Spondylosis without myelopathy or radiculopathy, lumbar region: Secondary | ICD-10-CM | POA: Diagnosis not present

## 2021-09-01 DIAGNOSIS — M4802 Spinal stenosis, cervical region: Secondary | ICD-10-CM | POA: Insufficient documentation

## 2021-09-01 DIAGNOSIS — M5030 Other cervical disc degeneration, unspecified cervical region: Secondary | ICD-10-CM | POA: Insufficient documentation

## 2021-09-01 DIAGNOSIS — R519 Headache, unspecified: Secondary | ICD-10-CM | POA: Insufficient documentation

## 2021-09-01 DIAGNOSIS — M25561 Pain in right knee: Secondary | ICD-10-CM | POA: Insufficient documentation

## 2021-09-01 DIAGNOSIS — G4486 Cervicogenic headache: Secondary | ICD-10-CM | POA: Insufficient documentation

## 2021-09-01 DIAGNOSIS — Z9181 History of falling: Secondary | ICD-10-CM | POA: Insufficient documentation

## 2021-09-01 DIAGNOSIS — M19011 Primary osteoarthritis, right shoulder: Secondary | ICD-10-CM | POA: Diagnosis not present

## 2021-09-01 DIAGNOSIS — Z981 Arthrodesis status: Secondary | ICD-10-CM | POA: Diagnosis not present

## 2021-09-01 DIAGNOSIS — R600 Localized edema: Secondary | ICD-10-CM | POA: Diagnosis not present

## 2021-09-01 DIAGNOSIS — Z789 Other specified health status: Secondary | ICD-10-CM | POA: Insufficient documentation

## 2021-09-01 DIAGNOSIS — G8929 Other chronic pain: Secondary | ICD-10-CM | POA: Insufficient documentation

## 2021-09-01 DIAGNOSIS — R2689 Other abnormalities of gait and mobility: Secondary | ICD-10-CM | POA: Insufficient documentation

## 2021-09-01 DIAGNOSIS — M4182 Other forms of scoliosis, cervical region: Secondary | ICD-10-CM | POA: Diagnosis not present

## 2021-09-01 DIAGNOSIS — M1612 Unilateral primary osteoarthritis, left hip: Secondary | ICD-10-CM | POA: Diagnosis not present

## 2021-09-01 DIAGNOSIS — M175 Other unilateral secondary osteoarthritis of knee: Secondary | ICD-10-CM | POA: Insufficient documentation

## 2021-09-01 DIAGNOSIS — M47814 Spondylosis without myelopathy or radiculopathy, thoracic region: Secondary | ICD-10-CM | POA: Diagnosis not present

## 2021-09-01 DIAGNOSIS — M4316 Spondylolisthesis, lumbar region: Secondary | ICD-10-CM | POA: Diagnosis not present

## 2021-09-01 DIAGNOSIS — M1712 Unilateral primary osteoarthritis, left knee: Secondary | ICD-10-CM | POA: Diagnosis not present

## 2021-09-01 NOTE — Progress Notes (Signed)
Safety precautions to be maintained throughout the outpatient stay will include: orient to surroundings, keep bed in low position, maintain call bell within reach at all times, provide assistance with transfer out of bed and ambulation.  

## 2021-09-06 DIAGNOSIS — R42 Dizziness and giddiness: Secondary | ICD-10-CM | POA: Diagnosis not present

## 2021-09-06 DIAGNOSIS — H6981 Other specified disorders of Eustachian tube, right ear: Secondary | ICD-10-CM | POA: Diagnosis not present

## 2021-09-06 DIAGNOSIS — G629 Polyneuropathy, unspecified: Secondary | ICD-10-CM | POA: Diagnosis not present

## 2021-09-08 LAB — COMPLIANCE DRUG ANALYSIS, UR

## 2021-09-11 LAB — COMP. METABOLIC PANEL (12)
AST: 24 IU/L (ref 0–40)
Albumin/Globulin Ratio: 2.5 — ABNORMAL HIGH (ref 1.2–2.2)
Albumin: 4.8 g/dL — ABNORMAL HIGH (ref 3.7–4.7)
Alkaline Phosphatase: 61 IU/L (ref 44–121)
BUN/Creatinine Ratio: 12 (ref 12–28)
BUN: 18 mg/dL (ref 8–27)
Bilirubin Total: 0.3 mg/dL (ref 0.0–1.2)
Calcium: 9.2 mg/dL (ref 8.7–10.3)
Chloride: 103 mmol/L (ref 96–106)
Creatinine, Ser: 1.51 mg/dL — ABNORMAL HIGH (ref 0.57–1.00)
Globulin, Total: 1.9 g/dL (ref 1.5–4.5)
Glucose: 90 mg/dL (ref 70–99)
Potassium: 6.2 mmol/L — ABNORMAL HIGH (ref 3.5–5.2)
Sodium: 137 mmol/L (ref 134–144)
Total Protein: 6.7 g/dL (ref 6.0–8.5)
eGFR: 36 mL/min/{1.73_m2} — ABNORMAL LOW (ref >59)

## 2021-09-11 LAB — 25-HYDROXY VITAMIN D LCMS D2+D3
25-Hydroxy, Vitamin D-2: <1 ng/mL
25-Hydroxy, Vitamin D-3: 6.3 ng/mL
25-Hydroxy, Vitamin D: 6.3 ng/mL — ABNORMAL LOW

## 2021-09-11 LAB — VITAMIN B12: Vitamin B-12: 576 pg/mL (ref 232–1245)

## 2021-09-11 LAB — MAGNESIUM: Magnesium: 2.3 mg/dL (ref 1.6–2.3)

## 2021-09-11 LAB — C-REACTIVE PROTEIN: CRP: <1 mg/L (ref 0–10)

## 2021-09-11 LAB — SEDIMENTATION RATE: Sed Rate: 13 mm/hr (ref 0–40)

## 2021-10-01 DIAGNOSIS — Z20822 Contact with and (suspected) exposure to covid-19: Secondary | ICD-10-CM | POA: Diagnosis not present

## 2021-10-06 DIAGNOSIS — U071 COVID-19: Secondary | ICD-10-CM | POA: Diagnosis not present

## 2021-10-15 DIAGNOSIS — Z20822 Contact with and (suspected) exposure to covid-19: Secondary | ICD-10-CM | POA: Diagnosis not present

## 2021-10-25 DIAGNOSIS — Z20822 Contact with and (suspected) exposure to covid-19: Secondary | ICD-10-CM | POA: Diagnosis not present

## 2021-11-02 IMAGING — CR DG FOOT 2V*L*
4 series · 4 of 4 positions shown · non-contrast
Comparison: None.

CLINICAL DATA: Trip and fall injury 2 days ago. Bruising to the
posterior surface of the foot and anterior second and third
metatarsal area.

EXAM:
LEFT FOOT - 2 VIEW

[x foot ap left]
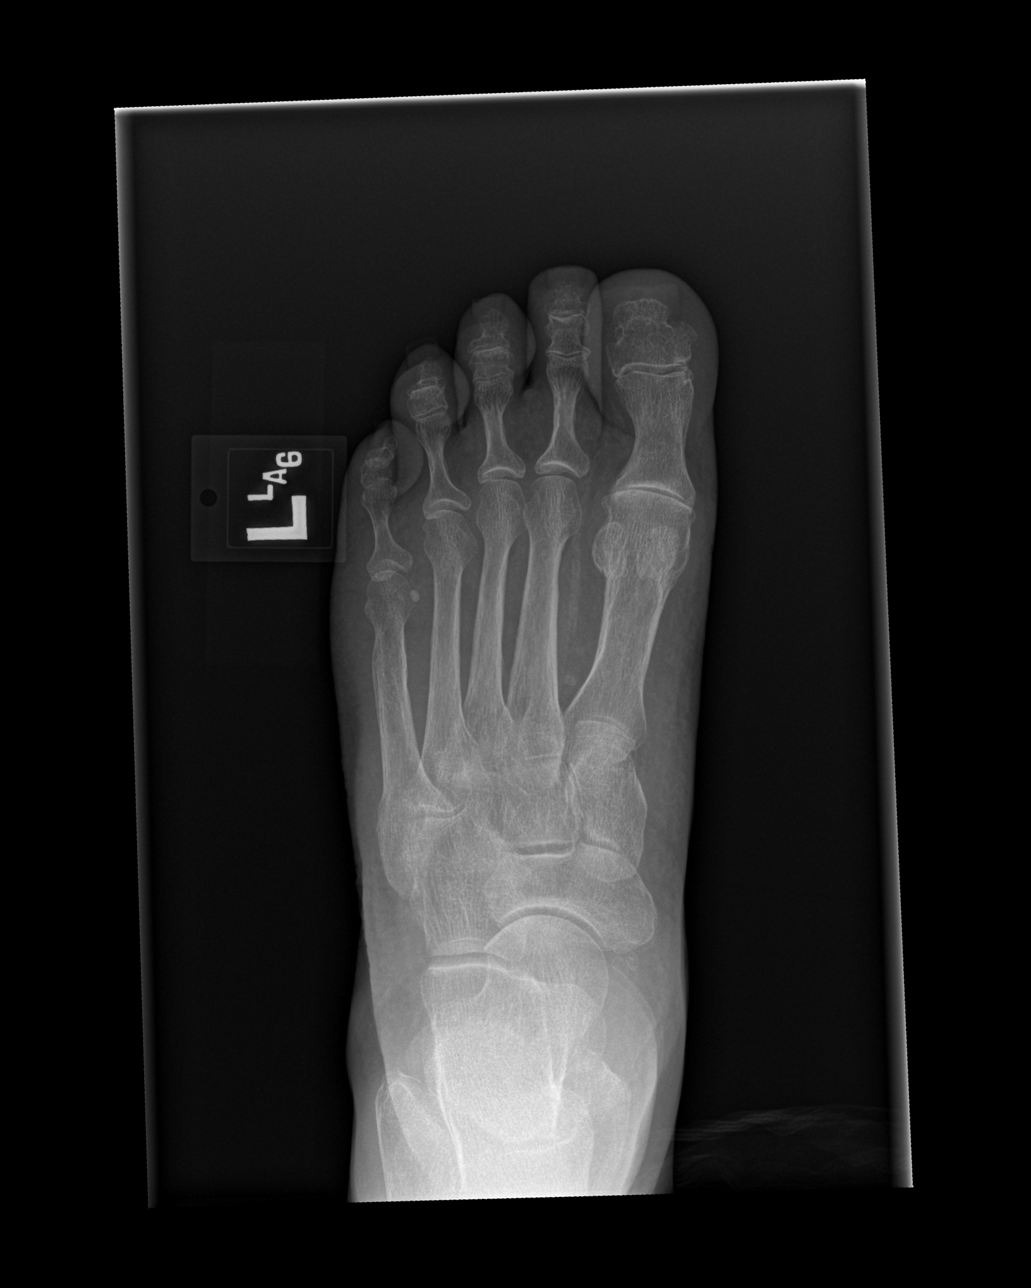

[x foot lat left (1 of 3)]
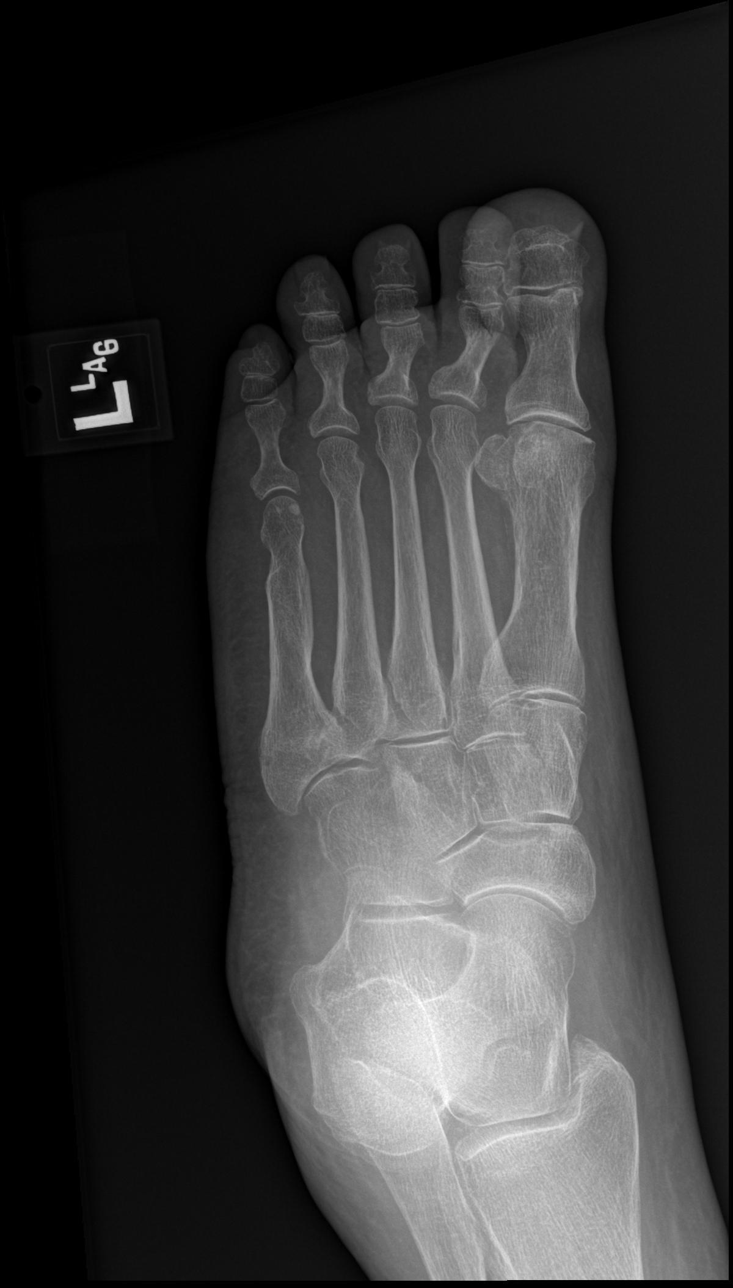

[x foot lat left (2 of 3)]
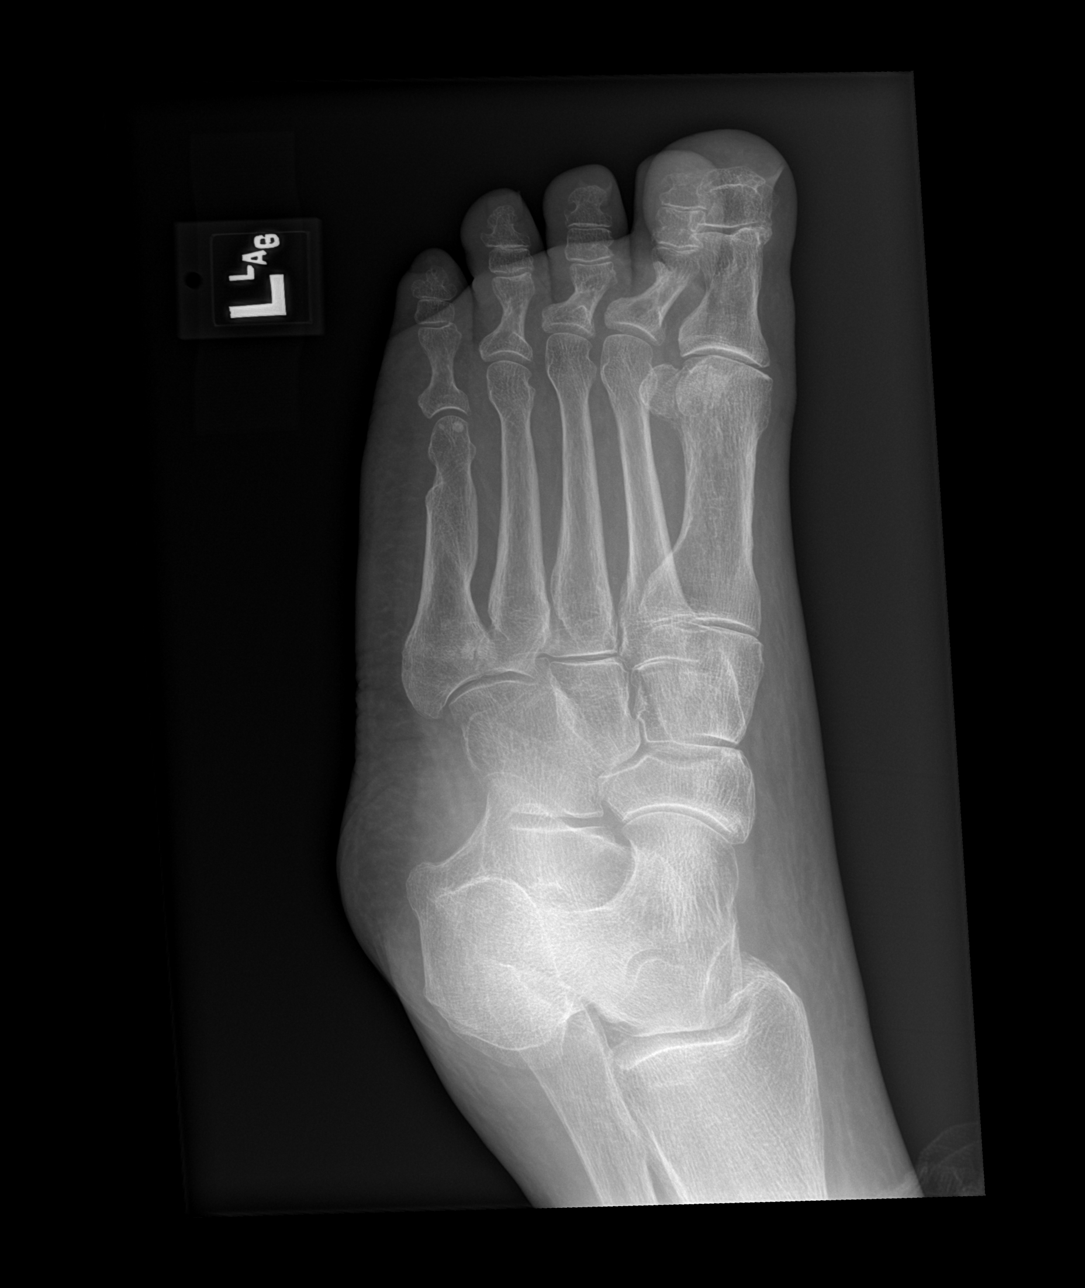

[x foot lat left (3 of 3)]
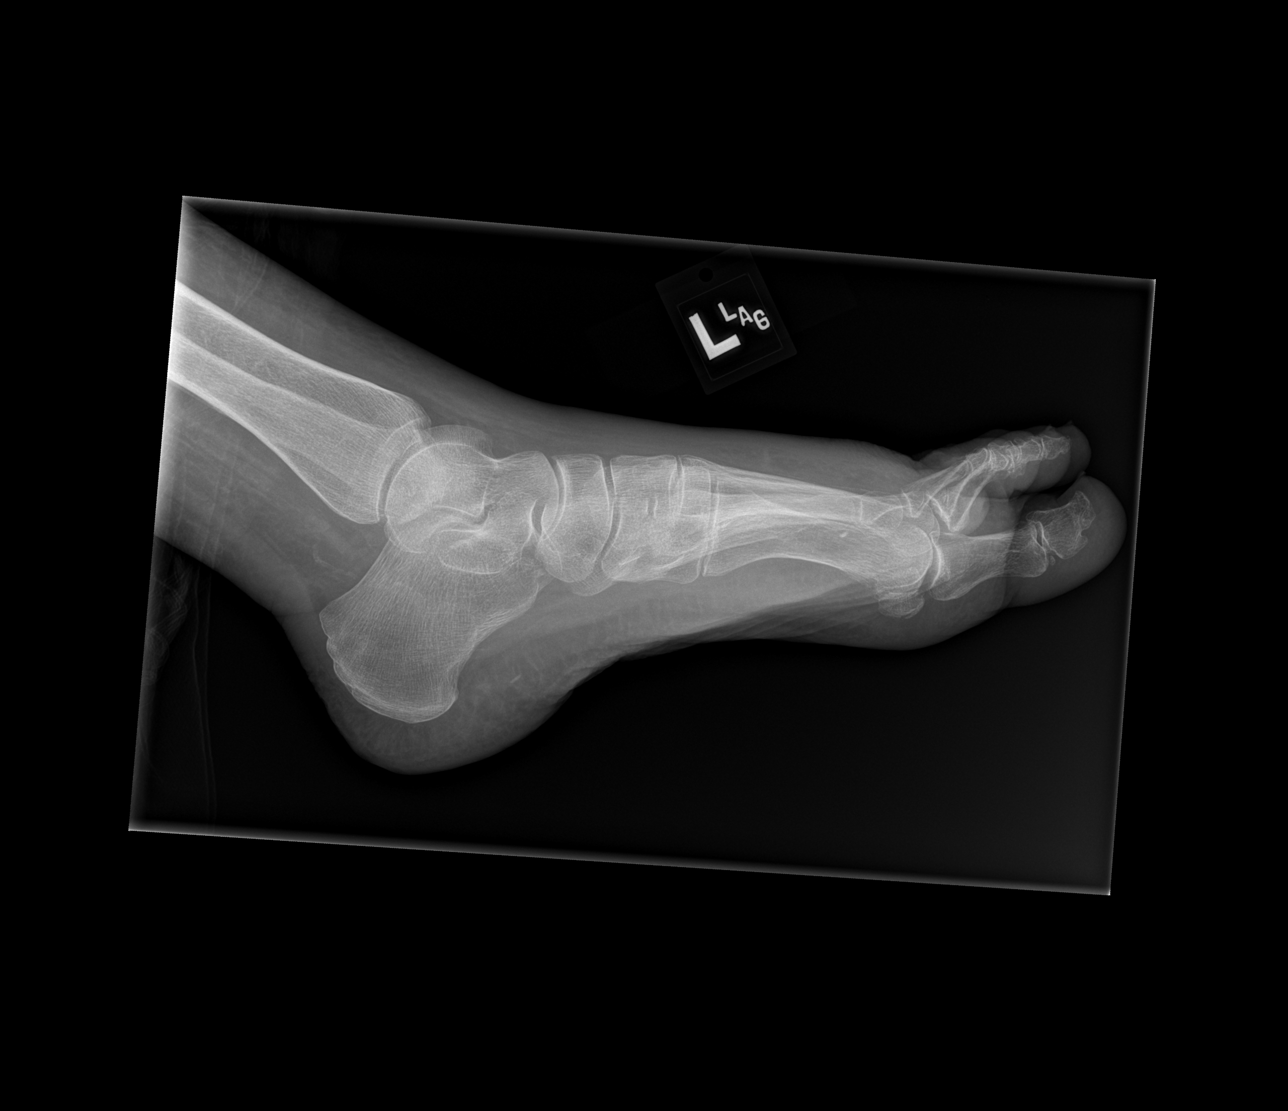

[4 of 4 positions shown; findings below may reference images not displayed]

FINDINGS: Soft tissue swelling over the dorsum of the foot. No acute fracture
or dislocation. Old fracture deformity of the fifth metatarsal
shaft. Degenerative changes in the interphalangeal, intertarsal and
first metatarsal-phalangeal joints. No focal bone lesion or bone
destruction. Vascular calcifications in the soft tissues.
IMPRESSION: Soft tissue swelling. No acute bony abnormalities. Degenerative
changes.

## 2021-11-07 NOTE — Progress Notes (Deleted)
Patient rescheduled to February 20 secondary to gastroenteritis.

## 2021-11-08 ENCOUNTER — Ambulatory Visit: Payer: Medicare Other | Admitting: Pain Medicine

## 2021-11-08 DIAGNOSIS — G8929 Other chronic pain: Secondary | ICD-10-CM

## 2021-11-20 DIAGNOSIS — Z20822 Contact with and (suspected) exposure to covid-19: Secondary | ICD-10-CM | POA: Diagnosis not present

## 2021-12-05 NOTE — Progress Notes (Signed)
PROVIDER NOTE: Information contained herein reflects review and annotations entered in association with encounter. Interpretation of such information and data should be left to medically-trained personnel. Information provided to patient can be located elsewhere in the medical record under "Patient Instructions". Document created using STT-dictation technology, any transcriptional errors that may result from process are unintentional.    Patient: [name]  Service Category: E/M  Provider: Gaspar Cola, MD  DOB: 1945/12/30  DOS: 12/06/2021  Specialty: Interventional Pain Management  MRN: [id number]  Setting: Ambulatory outpatient  PCP: Leonides Sake, MD  Type: Established Patient    Referring Provider: Leonides Sake, MD  Location: Office  Delivery: Face-to-face     Primary Reason(s) for Visit: Encounter for evaluation before starting new chronic pain management plan of care (Level of risk: moderate) CC: Back Pain (lower)  HPI  [name] is a 76 y.o. year old, female patient, who comes today for a follow-up evaluation to review the test results and decide on a treatment plan. She has Hypothyroidism; HYPERLIPIDEMIA; Essential hypertension; CARDIAC ARRHYTHMIA; CHF; Chronic systolic heart failure (HCC); IBS; Automatic implantable cardioverter-defibrillator in situ; Mitral regurgitation; Anxiety state, unspecified; Atrial fibrillation ([place]); Ventricular fibrillation ([place]); Scoliosis; Chronic shoulder pain (Right); Chronic knee pain (3ry area of Pain) (Bilateral); Acute non-recurrent pansinusitis; Acute CHF (congestive heart failure) ([place]); Macrocytic anemia; Toe injury, left, sequela; Acute respiratory failure with hypoxia ([place]); Chronic pain syndrome; Pharmacologic therapy; Disorder of skeletal system; Problems influencing health status; Encounter for chronic pain management; DDD (degenerative disc disease), cervical; Cervical facet osteoarthritis (Bilateral) (R>L); Cervical facet arthropathy  (Bilateral) (R>L); Cervical foraminal stenosis (Right); Other unilateral secondary osteoarthritis of knee (Right); Calcium pyrophosphate deposition disease (CPPD); Osteoarthritis of knees (Bilateral); Osteoporosis, post-menopausal; Chronic low back pain (1ry area of Pain) (Bilateral) (R>L) w/o sciatica; Chronic shoulder pain (6th area of Pain) (Bilateral) (R=L); Chronic upper back pain; Chronic lower extremity pain (2ry area of Pain) (Bilateral) (R>L); Chronic hip pain (4th area of Pain) (Bilateral) (R>L); Chronic neck and back pain (5th area of Pain) (Bilateral) (R>L); Cervicalgia; Cervicogenic headache (7th area of Pain); Occipital headache; Vertigo; Balance problems; At high risk for falls; Ear pain, bilateral; Cervical facet syndrome; Vitamin D deficiency; Osteoarthritis of acromioclavicular joints (Bilateral); Osteoarthritis of glenohumeral joints (Bilateral); Osteoarthritis of shoulders (Bilateral); 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 Her primarily concern today is the Back Pain (lower)  Pain Assessment: Location: Lower Back Radiating: pain radiaties down both leg to her calf Onset: More than a month ago Duration: Chronic pain Quality: Aching, Burning, Constant Severity: 8 /10 (subjective, self-reported pain score)  Effect on ADL: limits my daily activities Timing: Constant Modifying factors: meds, ice, heating pad BP: (!) 143/84   HR: 73  [name] comes in today for a follow-up visit after her initial evaluation on 11/08/2021. Today we went over the results of her tests. These were explained in "Layman's terms". During today's appointment we went over my diagnostic impression, as well as the proposed treatment  plan.  Review of initial evaluation (09/01/2021): "According to the patient the primary area of pain is that of the lower back (Bilateral) (R>L).  The patient denies any back surgeries, recent x-rays, nerve blocks, or physical therapy.  The patient's secondary area pain is that of the lower extremities (Bilateral) (R>L).  The patient denies any surgeries, physical therapy, she does admit having had some x-rays done at her orthopedic surgeons office (Dr. Estell Harpin).  Official reports are not available at this time.  According to the patient the right lower extremity pain goes down to her calf region through the front and back of the leg.  She describes having intermittent weakness of the lower extremities, tingling, numbness, and swelling, especially of the knees.  The tingling and numbness it appears to affect her feet, but it is only intermittent.  In the case of the left lower extremity the pattern is the same, but not as intense as in the right lower extremity.  The patient's third area pain is that of the knees (Bilateral) (right = left).  The patient denies any knee surgeries and does admit to having had some x-rays at [facility], but the official reports are not available.  She denies any physical therapy and does admit having had some intra-articular injections by Dr. Sharlet Salina.  The patient's fourth area pain is that of the hips (Bilateral) (R>L).  She denies any hip surgery, recent x-rays, physical therapy, or any type of nerve blocks or joint injections of the hips.  The patient's fifth area pain is that of the neck (Bilateral) (R>L).  She denies any neck surgery, recent x-rays, physical therapy, or nerve blocks.  She indicates having had some physical therapy in the neck region when she was in her 65s.  She is currently 76 years old.  She also indicated having used a TENS unit, which she can not use any longer secondary to the fact that she had a cardiac defibrillator implanted.  The  patient refers that the neck pain seems to refer pain towards the upper back, shoulders, and the back of the head.  The patient's sixth area pain is that of the shoulders (Bilateral) (right = left).  She denies any shoulder surgeries, recent x-rays, or physical therapy.  However she does indicate having had a right shoulder injection done by Dr. Sharlet Salina which apparently did help.  The patient's seventh area pain is that of the headaches.  She describes having frontal headaches, but more commonly she has them in the occipital region associated with the neck pain.  Today's physical exam was limited secondary to the fact that the patient was experiencing vertigo and balance problems.  She indicates that she has been having that since she started having ear pain.  I asked her if she had notified her primary care physician and she indicated that she has an upcoming appointment where she is planning to talk to her about that.  Pharmacotherapy: The patient is taking gabapentin (Neurontin) 200 mg p.o. at bedtime.  In addition to the above, the patient has multiple medical problems including depression, anxiety, panic attacks, and cardiovascular problems that required for the patient to have a cardiac defibrillator implanted.  Today I went over the results of the lab work.  She does have a vitamin D deficiency and therefore I have ordered replacement therapy.  In addition I went over the results of her x-rays and I have explained the findings in layman's terms.  Based on the patient's symptoms and the available imaging, I have decided to schedule her for a diagnostic bilateral lumbar facet block of the L4-5 and L5-S1 facet joints.  I have explained the purpose of the procedure has been diagnostic.  In considering the treatment plan options, Ms. Reining was reminded that I no longer take patients for medication management  only. I asked her to let me know if she had no intention of taking advantage of the  interventional therapies, so that we could make arrangements to provide this space to someone interested. I also made it clear that undergoing interventional therapies for the purpose of getting pain medications is very inappropriate on the part of a patient, and it will not be tolerated in this practice. This type of behavior would suggest true addiction and therefore it requires referral to an addiction specialist.   Further details on both, my assessment(s), as well as the proposed treatment plan, please see below.  Controlled Substance Pharmacotherapy Assessment REMS (Risk Evaluation and Mitigation Strategy)  Opioid Analgesic: None MME/day: 0 mg/day  Pill Count: None expected due to no prior prescriptions written by our practice. Chauncey Fischer, RN  12/06/2021  1:37 PM  Sign when Signing Visit Nursing Pain Medication Assessment:  Safety precautions to be maintained throughout the outpatient stay will include: orient to surroundings, keep bed in low position, maintain call bell within reach at all times, provide assistance with transfer out of bed and ambulation.     Pharmacokinetics: Liberation and absorption (onset of action): WNL Distribution (time to peak effect): WNL Metabolism and excretion (duration of action): WNL         Pharmacodynamics: Desired effects: Analgesia: Ms. Sawatzky reports >50% benefit. Functional ability: Patient reports that medication allows her to accomplish basic ADLs Clinically meaningful improvement in function (CMIF): Sustained CMIF goals met Perceived effectiveness: Described as relatively effective, allowing for increase in activities of daily living (ADL) Undesirable effects: Side-effects or Adverse reactions: None reported Monitoring: [place] PMP: PDMP reviewed during this encounter. Online review of the past 38-month period previously conducted. Not applicable at this point since we have not taken over the patient's medication management yet. List of other  Serum/Urine Drug Screening Test(s):  Lab Results  Component Value Date   COCAINSCRNUR NONE DETECTED 08/26/2012   THCU NONE DETECTED 08/26/2012   ETH <11 08/26/2012   List of all UDS test(s) done:  Lab Results  Component Value Date   SUMMARY Note 09/01/2021   Last UDS on record: Summary  Date Value Ref Range Status  09/01/2021 Note  Final    Comment:    ==================================================================== Compliance Drug Analysis, Ur ==================================================================== Test                             Result       Flag       Units  Drug Present and Declared for Prescription Verification   Gabapentin                     PRESENT      EXPECTED   Paroxetine                     PRESENT      EXPECTED   Acetaminophen                  PRESENT      EXPECTED   Salicylate                     PRESENT      EXPECTED  Drug Absent but Declared for Prescription Verification   Hydroxyzine                    Not Detected UNEXPECTED ==================================================================== Test  Result    Flag   Units      Ref Range   Creatinine              182              mg/dL      >=20 ==================================================================== Declared Medications:  The flagging and interpretation on this report are based on the  following declared medications.  Unexpected results may arise from  inaccuracies in the declared medications.   **Note: The testing scope of this panel includes these medications:   Gabapentin (Neurontin)  Hydroxyzine (Atarax)  Paroxetine (Paxil)   **Note: The testing scope of this panel does not include small to  moderate amounts of these reported medications:   Acetaminophen (Tylenol)  Acetaminophen (Excedrin)  Aspirin  Aspirin (Excedrin)  Salicylate (Salicylamide)   **Note: The testing scope of this panel does not include the  following reported  medications:   Amiodarone (Pacerone)  Caffeine (Excedrin)  Carvedilol (Coreg)  Cyanocobalamin  Eye Drop  Levothyroxine (Synthroid)  Multivitamin  Pravastatin (Pravachol)  Sacubitril (Entresto)  Sodium Chloride  Spironolactone (Aldactone)  Valsartan (Entresto) ==================================================================== For clinical consultation, please call [phone]. ====================================================================    UDS interpretation: No unexpected findings.          Medication Assessment Form: Patient introduced to form today Treatment compliance: Treatment may start today if patient agrees with proposed plan. Evaluation of compliance is not applicable at this point Risk Assessment Profile: Aberrant behavior: See initial evaluations. None observed or detected today Comorbid factors increasing risk of overdose: See initial evaluation. No additional risks detected today Opioid risk tool (ORT):  Opioid Risk  09/01/2021  Alcohol 0  Illegal Drugs 0  Rx Drugs 0  History of Preadolescent Sexual Abuse 0  Psychological Disease 2  ADD Negative  OCD Negative  Bipolar Negative  Depression 1  Opioid Risk Tool Scoring 3  Opioid Risk Interpretation Low Risk    ORT Scoring interpretation table:  Score <3 = Low Risk for SUD  Score between 4-7 = Moderate Risk for SUD  Score >8 = High Risk for Opioid Abuse   Risk of substance use disorder (SUD): Low  Risk Mitigation Strategies:  Patient opioid safety counseling: Completed today. Counseling provided to patient as per "Patient Counseling Document". Document signed by patient, attesting to counseling and understanding Patient-Prescriber Agreement (PPA): Obtained today.  Controlled substance notification to other providers: Written and sent today.  Pharmacologic Plan: Non-opioid analgesic therapy offered. Interventional alternatives discussed.             Laboratory Chemistry Profile   Renal Lab  Results  Component Value Date   BUN 18 09/01/2021   CREATININE 1.51 (H) 09/01/2021   BCR 12 09/01/2021   GFRAA 60 (L) 02/23/2014   GFRNONAA 57 (L) 01/24/2021   PROTEINUR 30 (A) 06/14/2016     Electrolytes Lab Results  Component Value Date   NA 137 09/01/2021   K 6.2 (H) 09/01/2021   CL 103 09/01/2021   CALCIUM 9.2 09/01/2021   MG 2.3 09/01/2021   PHOS 4.2 01/24/2021     Hepatic Lab Results  Component Value Date   AST 24 09/01/2021   ALT 20 05/24/2013   ALBUMIN 4.8 (H) 09/01/2021   ALKPHOS 61 09/01/2021   LIPASE 19 05/14/2013     ID Lab Results  Component Value Date   SARSCOV2NAA NEGATIVE 01/20/2021   STAPHAUREUS POSITIVE (A) 05/16/2013   MRSAPCR NEGATIVE 05/16/2013     Bone Lab Results  Component Value Date   25OHVITD1 6.3 (L) 09/01/2021   25OHVITD2 <1.0 09/01/2021   25OHVITD3 6.3 09/01/2021     Endocrine Lab Results  Component Value Date   GLUCOSE 90 09/01/2021   GLUCOSEU NEGATIVE 06/14/2016   HGBA1C 5.1 05/17/2013   TSH 20.415 (H) 01/21/2021   FREET4 1.55 05/15/2013   CRTSLPL  06/30/2008    19.5 (NOTE)  AM:  4.3 - 22.4 ug/dL PM:  3.1 - 16.7 ug/dL     Neuropathy Lab Results  Component Value Date   VITAMINB12 576 09/01/2021   FOLATE 16.5 01/21/2021   HGBA1C 5.1 05/17/2013     CNS No results found for: COLORCSF, APPEARCSF, RBCCOUNTCSF, WBCCSF, POLYSCSF, LYMPHSCSF, EOSCSF, PROTEINCSF, GLUCCSF, JCVIRUS, CSFOLI, IGGCSF, LABACHR, ACETBL, LABACHR, ACETBL   Inflammation (CRP: Acute   ESR: Chronic) Lab Results  Component Value Date   CRP <1 09/01/2021   ESRSEDRATE 13 09/01/2021   LATICACIDVEN 1.2 09/05/2009     Rheumatology No results found for: RF, ANA, LABURIC, URICUR, LYMEIGGIGMAB, LYMEABIGMQN, HLAB27   Coagulation Lab Results  Component Value Date   INR 0.97 02/23/2014   LABPROT 12.7 02/23/2014   APTT 29 05/24/2013   PLT 269 01/24/2021   DDIMER 1.34 (H) 05/15/2013     Cardiovascular Lab Results  Component Value Date   BNP >4,500.0  (H) 01/21/2021   CKTOTAL 117 09/04/2009   CKMB 8.7 (H) 09/04/2009   TROPONINI <0.30 05/15/2013   HGB 11.4 (L) 01/24/2021   HCT 33.1 (L) 01/24/2021     Screening Lab Results  Component Value Date   SARSCOV2NAA NEGATIVE 01/20/2021   STAPHAUREUS POSITIVE (A) 05/16/2013   MRSAPCR NEGATIVE 05/16/2013     Cancer No results found for: CEA, CA125, LABCA2   Allergens No results found for: ALMOND, APPLE, ASPARAGUS, AVOCADO, BANANA, BARLEY, BASIL, BAYLEAF, GREENBEAN, LIMABEAN, WHITEBEAN, BEEFIGE, REDBEET, BLUEBERRY, BROCCOLI, CABBAGE, MELON, CARROT, CASEIN, CASHEWNUT, CAULIFLOWER, CELERY     Note: Lab results reviewed.  Recent Diagnostic Imaging Review  Cervical Imaging: Cervical MR wo contrast: Results for orders placed in visit on 01/20/02 MR Cervical Spine Wo Contrast  Narrative FINDINGS CLINICAL DATA:  SEVERE NECK PAIN.  SCOLIOSIS.  RIGHT NECK, SHOULDER, AND RIGHT ARM PAIN. MRI OF THE CERVICAL SPINE WITHOUT CONTRAST PERFORMED ON THE OPEN MAGNET NO PRIOR STUDIES AND NO X-RAYS ARE AVAILABLE FOR CORRELATION. THERE IS MODERATE SCOLIOSIS CONVEXED TO THE LEFT.  THERE IS MILD MOTION ARTIFACT ON THE STUDY DEGRADING IMAGE QUALITY. THE CERVICAL ALIGNMENT IS NORMAL.  NO CORD SIGNAL ABNORMALITY IS IDENTIFIED.  NEGATIVE FOR FRACTURE. C3-4:   THERE IS MILD FACET ARTHROPATHY BILATERALLY, RIGHT GREATER THAN LEFT AND MILD FORAMINAL NARROWING, RIGHT GREATER THAN LEFT. C4-5:  THERE IS BILATERAL MILD FACET ARTHROPATHY.  THERE IS VERY MILD DISK DEGENERATION WITHOUT SPINAL STENOSIS. C5-6:  THERE IS MILD DISK DEGENERATION AND MILD DISK BULGING.  THERE IS MILD FACET ARTHROPATHY BILATERALLY.   NEURAL FORAMINA APPEAR PATENT. C6-7:  NEGATIVE. IMPRESSION 1)    NEGATIVE FOR DISK HERNIATION OR SPINAL STENOSIS. 2)    SCOLIOSIS AND MILD MID CERVICAL FACET ARTHROPATHY.  Cervical CT wo contrast: Results for orders placed during the hospital encounter of 10/31/18 CT Cervical Spine Wo  Contrast  Narrative CLINICAL DATA:  Mechanical fall 2 days ago, slipped on steps outside landing on back, worsening back pain  EXAM: CT HEAD WITHOUT CONTRAST  CT CERVICAL SPINE WITHOUT CONTRAST  TECHNIQUE: Multidetector CT imaging of the head and cervical spine was performed following the standard protocol without intravenous contrast. Multiplanar CT image  reconstructions of the cervical spine were also generated.  COMPARISON:  None  FINDINGS: CT HEAD FINDINGS  Brain: Mild generalized atrophy. Normal ventricular morphology. No midline shift or mass effect. Minimal small vessel chronic ischemic changes of deep cerebral white matter. No intracranial hemorrhage, mass lesion or evidence of acute infarction. No extra-axial fluid collections.  Vascular: No hyperdense vessels. Mild atherosclerotic calcifications of internal carotid arteries bilaterally at skull base  Skull: Intact. 11 x 12 mm focus of osseous thickening and ground-glass attenuation at the anterior aspect of the medial wall of the RIGHT maxillary sinus question fibrous dysplasia.  Sinuses/Orbits: Clear  Other: N/A  CT CERVICAL SPINE FINDINGS  Alignment: Grossly normal.  Minimal motion artifacts noted.  Skull base and vertebrae: Osseous demineralization. Skull base intact. Vertebral body heights maintained. Minimal disc space narrowing and endplate spur formation at C4-C5 and C5-C6. Scattered facet degenerative changes greater on RIGHT. No acute fracture, subluxation, or bone destruction.  Soft tissues and spinal canal: Prevertebral soft tissues normal thickness.  Disc levels:  No additional abnormalities  Upper chest: Calcified granuloma RIGHT upper lobe. Lung apices otherwise clear.  Other: N/A  IMPRESSION: Atrophy with minimal small vessel chronic ischemic changes of deep cerebral white matter.  No acute intracranial abnormalities.  Degenerative disc and facet disease changes of the cervical  spine.  No acute cervical spine abnormalities.   Electronically Signed By: Lavonia Dana M.D. On: 10/31/2018 17:14  Shoulder Imaging: Shoulder-R DG: Results for orders placed during the hospital encounter of 09/01/21 DG Shoulder Right  Narrative CLINICAL DATA:  Right shoulder pain chronic pain of both shoulders. Calcium pyrophosphate deposition disease.  EXAM: RIGHT SHOULDER - 2+ VIEW  COMPARISON:  None.  FINDINGS: Mild acromioclavicular spurring. Glenohumeral joint space is preserved. Minimal inferior glenohumeral osteophytes. No erosions. No fracture. No chondrocalcinosis. No soft tissue calcifications. Old right rib fractures. Right-sided battery pack and pacemaker wire partially included.  IMPRESSION: Mild acromioclavicular and minimal glenohumeral osteoarthritis.   Electronically Signed By: Keith Rake M.D. On: 09/02/2021 15:46  Shoulder-L DG: Results for orders placed during the hospital encounter of 09/01/21 DG Shoulder Left  Narrative CLINICAL DATA:  Chronic pain of both shoulders. Calcium pyrophosphate deposition disease.  EXAM: LEFT SHOULDER - 2+ VIEW  COMPARISON:  None.  FINDINGS: Minimal acromioclavicular spurring. Trace glenohumeral spurring with preservation of joint space. Mild subcortical cystic change in the anterior aspect of the humeral head. No visible chondrocalcinosis. No soft tissue calcifications. No erosion. No fracture.  IMPRESSION: 1. Minimal acromioclavicular and glenohumeral degenerative spurring. 2. Mild subcortical cystic change in the anterior aspect of the humeral head, nonspecific, an unusual location for rotator cuff arthropathy.   Electronically Signed By: Keith Rake M.D. On: 09/02/2021 15:48  Lumbosacral Imaging: Lumbar DG (Complete) 4+V: Results for orders placed during the hospital encounter of 04/06/16 DG Lumbar Spine Complete  Narrative CLINICAL DATA:  Pt fell 5 days ago, lower back  pain  EXAM: LUMBAR SPINE - COMPLETE 4+ VIEW  COMPARISON:  Abdomen 08/26/2012  FINDINGS: There is no evidence of lumbar spine fracture. Alignment is normal. Intervertebral disc spaces are maintained. Small anterior endplate spurs O9-B3.  IMPRESSION: No acute abnormality   Electronically Signed By: Lucrezia Europe M.D. On: 04/06/2016 20:52  Lumbar DG Bending views: Results for orders placed during the hospital encounter of 09/01/21 DG Lumbar Spine Complete W/Bend  Narrative CLINICAL DATA:  Chronic bilateral low back pain without sciatica. Calcium pyrophosphate deposition disease. Degenerative disc disease cervical. Low back pain.  EXAM: LUMBAR  SPINE - COMPLETE WITH BENDING VIEWS  COMPARISON:  Lumbar spine radiograph 10/31/2018  FINDINGS: There are 5 non-rib-bearing lumbar vertebra and diminutive ribs at T12. The bones appear diffusely under mineralized. Slightly exaggerated lumbar lordosis. There is 5-6 mm anterolisthesis of L5 on S1 that is unchanged on flexion or extension, progressive from 2020 exam. No evidence of instability. No visualized pars defects. There is moderate L5-S1 facet hypertrophy. There is also mild L4-L5 facet hypertrophy. Disc space narrowing and endplate spurring at G3-T5 and L2-L3. Vertebral body heights are normal. No evidence of fracture  IMPRESSION: 1. Grade 1 anterolisthesis of L5 on S1 without evidence of instability on flexion or extension. 2. Degenerative disc disease at L1-L2 and L2-L3. 3. Moderate L5-S1 facet hypertrophy. Mild L4-L5 facet hypertrophy. 4. Osteopenia/osteoporosis.   Electronically Signed By: Keith Rake M.D. On: 09/02/2021 15:40  Hip Imaging: Hip-L DG 2-3 views: Results for orders placed during the hospital encounter of 09/01/21 DG HIP UNILAT W OR W/O PELVIS 2-3 VIEWS LEFT  Narrative CLINICAL DATA:  Chronic bilateral hip pain.  EXAM: DG HIP (WITH OR WITHOUT PELVIS) 2-3V LEFT  COMPARISON:   None.  FINDINGS: Mild left hip joint space narrowing. There is mild lateral acetabular spurring. The femoral head is well seated in the acetabulum. No erosion, avascular necrosis, fracture or focal bone abnormality. Pubic rami and bony pelvis are intact. There is chondrocalcinosis of the pubic symphysis. Sacroiliac joints are congruent.  IMPRESSION: 1. Mild osteoarthritis of the left hip. 2. Pubic symphyseal chondrocalcinosis.   Electronically Signed By: Keith Rake M.D. On: 09/02/2021 15:50  Knee Imaging: Knee-R DG 4 views: Results for orders placed during the hospital encounter of 09/01/21 DG Knee Complete 4 Views Right  Narrative CLINICAL DATA:  Chronic pain of both knees. Calcium pyrophosphate deposition disease. Primary osteoarthritis of both knees.  EXAM: RIGHT KNEE - COMPLETE 4+ VIEW  COMPARISON:  Right knee radiograph 06/21/2016  FINDINGS: Moderate medial tibiofemoral joint space narrowing, increased from prior exam. There is moderate tricompartmental peripheral spurring, progressive. Chondrocalcinosis of the medial and lateral tibiofemoral compartments. No significant knee joint effusion. No fracture or erosion. Mild medial soft tissue edema.  IMPRESSION: 1. Moderate osteoarthritis of the right knee, progressed from 2017. 2. Chondrocalcinosis.   Electronically Signed By: Keith Rake M.D. On: 09/02/2021 15:41  Knee-L DG 4 views: Results for orders placed during the hospital encounter of 09/01/21 DG Knee Complete 4 Views Left  Narrative CLINICAL DATA:  Chronic pain of both knees. Calcium pyrophosphate deposition disease. Primary osteoarthritis of both knees.  EXAM: LEFT KNEE - COMPLETE 4+ VIEW  COMPARISON:  Remote radiograph 07/01/2010  FINDINGS: Medial tibiofemoral joint space narrowing, progressed from remote prior exam. Mild to moderate tricompartmental peripheral spurring and spurring of the tibial spines, also progressed. There is  mild medial and lateral tibiofemoral chondrocalcinosis. No fracture, erosion, or focal bone abnormality. No significant knee joint effusion. There may be minimal medial soft tissue edema.  IMPRESSION: 1. Mild to moderate osteoarthritis of the left knee, most prominent in the medial tibiofemoral compartment. 2. Mild chondrocalcinosis.   Electronically Signed By: Keith Rake M.D. On: 09/02/2021 15:43  Elbow Imaging: Elbow-R DG Complete: Results for orders placed during the hospital encounter of 02/22/14 DG Elbow Complete Right  Narrative CLINICAL DATA:  Fall, wrist fracture, elbow pain  EXAM: RIGHT ELBOW - COMPLETE 3+ VIEW  COMPARISON:  DG WRIST COMPLETE*R* dated 02/23/2014  FINDINGS: The study is moderately limited by the presence of the cast as well as by  related limited positioning. There is no evidence of dislocation or joint effusion at the with joint. No displaced fractures identified. Mild irregularity of the radial neck may be positional. No evidence of humeral fracture. Proximal ulna not well evaluated.  IMPRESSION: Study is limited for multiple reasons as described above. No evidence of distal humeral fracture. Radius and ulna not well evaluated. No displaced fractures or elbow dislocation.   Electronically Signed By: Skipper Cliche M.D. On: 02/23/2014 07:43  Wrist Imaging: Wrist-R DG Complete: Results for orders placed during the hospital encounter of 02/22/14 DG Wrist Complete Right  Narrative CLINICAL DATA:  Fall with wrist deformity  EXAM: RIGHT WRIST - COMPLETE 3+ VIEW  COMPARISON:  None.  FINDINGS: Acute transverse fracture through the distal radius, likely with intra-articular extension along the lateral articular surface. No visible offset along the articular contour. There is dorsal impaction causing dorsal tilting of the wrist. Acquired ulnar positive variance. Ulnar styloid process avulsion fracture. Normally aligned carpus.  Osteopenia.  IMPRESSION: Acute distal radius and ulna fractures as above.   Electronically Signed By: Jorje Guild M.D. On: 02/23/2014 01:28  Hand Imaging: Hand-L DG Complete: Results for orders placed in visit on 10/10/01 DG Hand Complete Left  Narrative FINDINGS CLINICAL DATA:   FELL.  INJURY TO LEFT HAND. LEFT HAND COMPLETE THREE VIEWS OF THE LEFT HAND SHOW A FRACTURE AT THE BASE OF THE PROXIMAL PHALANX OF THE LEFT FIFTH FINGER.  THERE IS MODERATE ANGULATION OF THE FRACTURE AND IT MAY EXTEND INTO THE JOINT SPACE.  NO FOREIGN BODY IS SEEN.  NO OTHER FRACTURE IS IDENTIFIED. IMPRESSION FRACTURE, BASE, PROXIMAL PHALANX, LEFT FIFTH FINGER, WITH SOME ANGULATION AND POSSIBLE EXTENSION INTO THE JOINT SPACE.  Complexity Note: Imaging results reviewed. Results shared with Ms. Bove, using Layman's terms.                        Meds   Current Outpatient Medications:    acetaminophen (TYLENOL) 500 MG tablet, Take 1,000 mg by mouth every 6 (six) hours as needed (back/neck pain). For pain, Disp: , Rfl:    amiodarone (PACERONE) 200 MG tablet, Take amiodarone 200 mg one tablet daily Monday through Saturday.  Do not take amiodarone on Sunday. (Patient taking differently: Take 200 mg by mouth every Monday, Tuesday, Wednesday, Thursday, and Friday. In the morning), Disp: 60 tablet, Rfl: 1   aspirin EC 81 MG tablet, Take 81 mg by mouth every other day. Swallow whole., Disp: , Rfl:    aspirin-acetaminophen-caffeine (EXCEDRIN MIGRAINE) 250-250-65 MG per tablet, Take 1 tablet by mouth every 6 (six) hours as needed for headache., Disp: , Rfl:    Aspirin-Salicylamide-Caffeine (BC HEADACHE POWDER PO), Take 1 Package by mouth 2 (two) times daily as needed (headache/pain)., Disp: , Rfl:    carvedilol (COREG) 3.125 MG tablet, Take 1 tablet (3.125 mg total) by mouth 2 (two) times daily with a meal., Disp: 60 tablet, Rfl: 2   Cholecalciferol (VITAMIN D3) 125 MCG (5000 UT) CAPS, Take 1 capsule (5,000  Units total) by mouth daily with breakfast. Take along with calcium and magnesium., Disp: 30 capsule, Rfl: 2   ENTRESTO 97-103 MG, Take 1 tablet by mouth 2 (two) times daily., Disp: , Rfl:    ergocalciferol (VITAMIN D2) 1.25 MG (50000 UT) capsule, Take 1 capsule (50,000 Units total) by mouth 2 (two) times a week. X 6 weeks., Disp: 12 capsule, Rfl: 0   gabapentin (NEURONTIN) 100 MG capsule, Take 1 capsule (100  mg total) by mouth at bedtime. (Patient taking differently: Take 200 mg by mouth at bedtime.), Disp: 30 capsule, Rfl: 2   Glycerin-Hypromellose-PEG 400 (VISINE DRY EYE) 0.2-0.2-1 % SOLN, Place 1-2 drops into both eyes 3 (three) times daily as needed (dry/irritated eyes)., Disp: , Rfl:    hydrOXYzine (ATARAX/VISTARIL) 10 MG tablet, Take 10 mg by mouth at bedtime as needed for itching (allergies)., Disp: , Rfl:    levothyroxine (SYNTHROID) 100 MCG tablet, Take 100 mcg by mouth daily before breakfast., Disp: , Rfl:    Multiple Vitamin (MULTIVITAMIN WITH MINERALS) TABS tablet, Take 1 tablet by mouth daily., Disp: 100 tablet, Rfl: 0   PARoxetine (PAXIL) 30 MG tablet, Take 60 mg by mouth every evening., Disp: , Rfl:    Polyvinyl Alcohol-Povidone (MURINE TEARS FOR DRY EYES OP), Place 2 drops into both eyes 2 (two) times daily as needed. For dry eyes, Disp: , Rfl:    pravastatin (PRAVACHOL) 40 MG tablet, TAKE 1 TABLET BY MOUTH EVERY EVENING., Disp: 90 tablet, Rfl: 3   sodium chloride (OCEAN) 0.65 % SOLN nasal spray, Place 1 spray into both nostrils as needed (dry nasal passages)., Disp: , Rfl:    spironolactone (ALDACTONE) 25 MG tablet, Take 0.5 tablets (12.5 mg total) by mouth daily., Disp: 30 tablet, Rfl: 2   vitamin B-12 1000 MCG tablet, Take 1 tablet (1,000 mcg total) by mouth daily., Disp: 100 tablet, Rfl: 0  ROS  Constitutional: Denies any fever or chills Gastrointestinal: No reported hemesis, hematochezia, vomiting, or acute GI distress Musculoskeletal: Denies any acute onset joint swelling,  redness, loss of ROM, or weakness Neurological: No reported episodes of acute onset apraxia, aphasia, dysarthria, agnosia, amnesia, paralysis, loss of coordination, or loss of consciousness  Allergies  Ms. Wee is allergic to morphine and related, codeine, demerol [meperidine hcl], meperidine hcl, and pollen extract.  Story  Drug: Ms. Boehning  reports no history of drug use. Alcohol:  reports no history of alcohol use. Tobacco:  reports that she has never smoked. She has never used smokeless tobacco. Medical:  has a past medical history of Anxiety, Arthritis, Automatic implantable cardioverter-defibrillator in situ, Cardiomegaly, CHF (congestive heart failure) ([place]) (05/2013), Chronic bronchitis ([place]), Depression, GERD (gastroesophageal reflux disease), Headache(784.0), Heart murmur, History of sudden cardiac arrest successfully resuscitated (2011), HLD (hyperlipidemia), Hypertension, Hypothyroidism, IBS (irritable bowel syndrome), Paroxysmal atrial fibrillation ([place]), Pneumonia, Scoliosis, and Severe mitral regurgitation (05/2013). Surgical: Ms. Pecore  has a past surgical history that includes Cardiac defibrillator placement (2010); Abdominal hysterectomy; Hernia repair (as child); TEE without cardioversion (09/24/2012); Multiple extractions with alveoloplasty (N/A, 05/21/2013); Mitral valve repair (N/A, 05/24/2013); Intraoprative transesophageal echocardiogram (N/A, 05/24/2013); Appendectomy; Cardiac catheterization; Colonoscopy; Open reduction internal fixation (orif) distal radial fracture (Right, 03/05/2014); Carpal tunnel release (Right, 03/05/2014); left heart catheterization with coronary angiogram (N/A, 05/16/2013); Fracture surgery; Cataract extraction w/PHACO (Left, 09/03/2020); and Cataract extraction w/PHACO (Right, 12/31/2020). Family: family history includes Arthritis in her mother; CAD in her mother and sister; CVA in her sister; Cancer - Other in her mother; Heart disease in her father; Seizures in  her brother.  Constitutional Exam  General appearance: Well nourished, well developed, and well hydrated. In no apparent acute distress Vitals:   12/06/21 1337  BP: (!) 143/84  Pulse: 73  Temp: (!) 97.3 F (36.3 C)  SpO2: 100%  Weight: 160 lb (72.6 kg)  Height: $Remove'5\' 4"'zgorxxO$  (1.626 m)   BMI Assessment: Estimated body mass index is 27.46 kg/m as calculated from the following:   Height as of  this encounter: $RemoveBeforeD'5\' 4"'jDlFtRXkhWjCCJ$  (1.626 m).   Weight as of this encounter: 160 lb (72.6 kg).  BMI interpretation table: BMI level Category Range association with higher incidence of chronic pain  <18 kg/m2 Underweight   18.5-24.9 kg/m2 Ideal body weight   25-29.9 kg/m2 Overweight Increased incidence by 20%  30-34.9 kg/m2 Obese (Class I) Increased incidence by 68%  35-39.9 kg/m2 Severe obesity (Class II) Increased incidence by 136%  >40 kg/m2 Extreme obesity (Class III) Increased incidence by 254%   Patient's current BMI Ideal Body weight  Body mass index is 27.46 kg/m. Ideal body weight: 54.7 kg (120 lb 9.5 oz) Adjusted ideal body weight: 61.9 kg (136 lb 5.7 oz)   BMI Readings from Last 4 Encounters:  12/06/21 27.46 kg/m  09/01/21 31.25 kg/m  06/22/21 31.17 kg/m  01/24/21 29.06 kg/m   Wt Readings from Last 4 Encounters:  12/06/21 160 lb (72.6 kg)  09/01/21 160 lb (72.6 kg)  06/22/21 159 lb 9.6 oz (72.4 kg)  01/24/21 169 lb 5 oz (76.8 kg)    Psych/Mental status: Alert, oriented x 3 (person, place, & time)       Eyes: PERLA Respiratory: No evidence of acute respiratory distress  Assessment & Plan  Primary Diagnosis & Pertinent Problem List: The primary encounter diagnosis was Chronic pain syndrome. 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  Visit Diagnosis: 1. Chronic pain syndrome   2. Chronic low back pain (1ry area of Pain) (Bilateral) (R>L) w/o sciatica   3. Chronic lower extremity pain (2ry area of Pain) (Bilateral) (R>L)   4. Chronic knee pain (3ry area of Pain) (Bilateral)   5. Chronic hip pain (4th area of Pain) (Bilateral) (R>L)   6. Vitamin D deficiency   7. Chronic neck and back pain (5th area of Pain) (Bilateral) (R>L)   8. Chronic shoulder pain (6th area of Pain) (Bilateral) (R=L)   9. Cervicogenic headache (7th area of Pain)   10. Osteoarthritis of acromioclavicular joints (Bilateral)   11. Osteoarthritis of glenohumeral joints (Bilateral)   12. Osteoarthritis of shoulders (Bilateral)   13. 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 Grade1 Anterolisthesis of lumbosacral spine (L5/S1)   15. DDD (degenerative disc disease), lumbar   16. Lumbar facet hypertrophy (L4-5, L5-S1)   17. Osteopenia of lumbar spine   18. Osteopenia determined by x-ray   19. Osteoarthritis of hip (Left)   20. Osteoarthritis of knees (Bilateral)    Problems updated and reviewed during this visit: Problem  Osteoarthritis of acromioclavicular joints (Bilateral)  Osteoarthritis of glenohumeral joints (Bilateral)  Osteoarthritis of shoulders (Bilateral)  Subcortical bone  cyst of anterior aspect of humeral head (Left)  Grade1 Anterolisthesis of lumbosacral spine (L5/S1)   Stable as of 09/02/2021.  Ddd (Degenerative Disc Disease), Lumbar  Lumbar facet hypertrophy (L4-5, L5-S1)  Osteopenia of Lumbar Spine  Osteopenia Determined By X-Ray  Osteoarthritis of hip (Left)  Chronic shoulder pain (6th area of Pain) (Bilateral) (R=L)  Chronic neck and back pain (5th area of Pain) (Bilateral) (R>L)  Cervicogenic headache (7th area of Pain)  Vitamin D Deficiency  Retained Dental Root (Resolved)    Plan of Care  Pharmacotherapy (Medications Ordered): Meds ordered this encounter  Medications   ergocalciferol (VITAMIN D2) 1.25 MG (50000 UT) capsule    Sig: Take 1 capsule (50,000 Units total) by mouth 2 (two) times a week. X 6 weeks.    Dispense:  12 capsule    Refill:  0    Fill one day early if pharmacy is closed on scheduled refill date. May substitute for generic, or similar, if available.   Cholecalciferol (VITAMIN D3) 125 MCG (5000 UT) CAPS    Sig: Take 1 capsule (5,000 Units total) by mouth daily with breakfast. Take along with calcium and magnesium.    Dispense:  30 capsule    Refill:  2    Fill 1 day early if pharmacy is closed on scheduled refill date. Generic permitted. Do not send renewal requests.   Procedure Orders         LUMBAR FACET(MEDIAL BRANCH NERVE BLOCK) MBNB     Lab Orders  No laboratory test(s) ordered today   Imaging Orders  No imaging studies ordered today   Referral Orders  No referral(s) requested today    Pharmacological management options:  Opioid Analgesics: I will not be prescribing any opioids at this time Membrane stabilizer: I will not be prescribing any at this time Muscle relaxant: I will not be prescribing any at this time NSAID: I will not be prescribing any at this time Other analgesic(s): I will not be prescribing any at this time      Interventional Therapies  Risk   Complexity Considerations:   Estimated  body mass index is 31.25 kg/m as calculated from the following:   Height as of this encounter: 5' (1.524 m).   Weight as of this encounter: 160 lb (72.6 kg). Cardiac defibrillator; A-fib; CHF; history of V-fib   Planned   Pending:   Diagnostic bilateral L4-5 and L5-S1 lumbar facet MBB #1    Under consideration:   Diagnostic right L4-5 LESI #1  Diagnostic bilateral L4-5 and L5-S1 lumbar facet MBB #1    Completed:   None at this time   Therapeutic   Palliative (PRN) options:   None established    Provider-requested follow-up: Return for (Clinic) procedure: (B) L4-5 & L5-S1 L-FCT Blk #1. Recent Visits No visits were found meeting these conditions. Showing recent visits within past 90 days and meeting all other requirements Today's Visits Date Type Provider Dept  12/06/21 Office Visit Milinda Pointer, MD [facility]  Showing today's visits and meeting all other requirements Future Appointments No visits were found meeting these conditions. Showing future appointments within next 90 days and meeting all other requirements  Primary Care Physician: Leonides Sake, MD Note by: Gaspar Cola, MD Date: 12/06/2021; Time: 2:35 PM

## 2021-12-06 ENCOUNTER — Ambulatory Visit: Payer: Medicare Other | Attending: Pain Medicine | Admitting: Pain Medicine

## 2021-12-06 ENCOUNTER — Other Ambulatory Visit: Payer: Self-pay

## 2021-12-06 ENCOUNTER — Encounter: Payer: Self-pay | Admitting: Pain Medicine

## 2021-12-06 VITALS — BP 143/84 | HR 73 | Temp 97.3°F | Ht 64.0 in | Wt 160.0 lb

## 2021-12-06 DIAGNOSIS — M8588 Other specified disorders of bone density and structure, other site: Secondary | ICD-10-CM | POA: Diagnosis not present

## 2021-12-06 DIAGNOSIS — M25512 Pain in left shoulder: Secondary | ICD-10-CM | POA: Insufficient documentation

## 2021-12-06 DIAGNOSIS — M8580 Other specified disorders of bone density and structure, unspecified site: Secondary | ICD-10-CM | POA: Diagnosis not present

## 2021-12-06 DIAGNOSIS — M19011 Primary osteoarthritis, right shoulder: Secondary | ICD-10-CM | POA: Diagnosis not present

## 2021-12-06 DIAGNOSIS — M25561 Pain in right knee: Secondary | ICD-10-CM | POA: Diagnosis not present

## 2021-12-06 DIAGNOSIS — M79604 Pain in right leg: Secondary | ICD-10-CM | POA: Diagnosis not present

## 2021-12-06 DIAGNOSIS — M25562 Pain in left knee: Secondary | ICD-10-CM | POA: Diagnosis not present

## 2021-12-06 DIAGNOSIS — M4317 Spondylolisthesis, lumbosacral region: Secondary | ICD-10-CM | POA: Diagnosis not present

## 2021-12-06 DIAGNOSIS — M25551 Pain in right hip: Secondary | ICD-10-CM | POA: Diagnosis not present

## 2021-12-06 DIAGNOSIS — M5136 Other intervertebral disc degeneration, lumbar region: Secondary | ICD-10-CM | POA: Diagnosis not present

## 2021-12-06 DIAGNOSIS — M85622 Other cyst of bone, left upper arm: Secondary | ICD-10-CM

## 2021-12-06 DIAGNOSIS — M25511 Pain in right shoulder: Secondary | ICD-10-CM | POA: Insufficient documentation

## 2021-12-06 DIAGNOSIS — G4486 Cervicogenic headache: Secondary | ICD-10-CM

## 2021-12-06 DIAGNOSIS — M19012 Primary osteoarthritis, left shoulder: Secondary | ICD-10-CM | POA: Insufficient documentation

## 2021-12-06 DIAGNOSIS — M170 Bilateral primary osteoarthritis of knee: Secondary | ICD-10-CM

## 2021-12-06 DIAGNOSIS — M545 Low back pain, unspecified: Secondary | ICD-10-CM | POA: Diagnosis not present

## 2021-12-06 DIAGNOSIS — M542 Cervicalgia: Secondary | ICD-10-CM | POA: Insufficient documentation

## 2021-12-06 DIAGNOSIS — M1612 Unilateral primary osteoarthritis, left hip: Secondary | ICD-10-CM

## 2021-12-06 DIAGNOSIS — G894 Chronic pain syndrome: Secondary | ICD-10-CM | POA: Diagnosis not present

## 2021-12-06 DIAGNOSIS — M25552 Pain in left hip: Secondary | ICD-10-CM | POA: Diagnosis not present

## 2021-12-06 DIAGNOSIS — M549 Dorsalgia, unspecified: Secondary | ICD-10-CM

## 2021-12-06 DIAGNOSIS — M79605 Pain in left leg: Secondary | ICD-10-CM | POA: Insufficient documentation

## 2021-12-06 DIAGNOSIS — M47816 Spondylosis without myelopathy or radiculopathy, lumbar region: Secondary | ICD-10-CM

## 2021-12-06 DIAGNOSIS — E559 Vitamin D deficiency, unspecified: Secondary | ICD-10-CM | POA: Diagnosis not present

## 2021-12-06 DIAGNOSIS — G8929 Other chronic pain: Secondary | ICD-10-CM | POA: Diagnosis not present

## 2021-12-06 DIAGNOSIS — M51369 Other intervertebral disc degeneration, lumbar region without mention of lumbar back pain or lower extremity pain: Secondary | ICD-10-CM

## 2021-12-06 MED ORDER — VITAMIN D3 125 MCG (5000 UT) PO CAPS: 1.0000 | ORAL_CAPSULE | Freq: Every day | ORAL | 2 refills | Status: AC

## 2021-12-06 MED ORDER — ERGOCALCIFEROL 1.25 MG (50000 UT) PO CAPS: 50000.0000 [IU] | ORAL_CAPSULE | ORAL | 0 refills | Status: AC

## 2021-12-06 NOTE — Progress Notes (Signed)
Nursing Pain Medication Assessment:  Safety precautions to be maintained throughout the outpatient stay will include: orient to surroundings, keep bed in low position, maintain call bell within reach at all times, provide assistance with transfer out of bed and ambulation.  

## 2021-12-06 NOTE — Patient Instructions (Signed)
______________________________________________________________________  Preparing for Procedure with Sedation  NOTICE: Due to recent regulatory changes, starting on May 17, 2021, procedures requiring intravenous (IV) sedation will no longer be performed at the [facility].  These types of procedures are required to be performed at [facility] ambulatory surgery facility.  We are very sorry for the inconvenience.  Procedure appointments are limited to planned procedures: No Prescription Refills. No disability issues will be discussed. No medication changes will be discussed.  Instructions: Oral Intake: Do not eat or drink anything for at least 8 hours prior to your procedure. (Exception: Blood Pressure Medication. See below.) Transportation: A driver is required. You may not drive yourself after the procedure. Blood Pressure Medicine: Do not forget to take your blood pressure medicine with a sip of water the morning of the procedure. If your Diastolic (lower reading) is above 100 mmHg, elective cases will be cancelled/rescheduled. Blood thinners: These will need to be stopped for procedures. Notify our staff if you are taking any blood thinners. Depending on which one you take, there will be specific instructions on how and when to stop it. Diabetics on insulin: Notify the staff so that you can be scheduled 1st case in the morning. If your diabetes requires high dose insulin, take only  of your normal insulin dose the morning of the procedure and notify the staff that you have done so. Preventing infections: Shower with an antibacterial soap the morning of your procedure. Build-up your immune system: Take 1000 mg of Vitamin C with every meal (3 times a day) the day prior to your procedure. Antibiotics: Inform the staff if you have a condition or reason that requires you to take antibiotics before dental procedures. Pregnancy: If you are pregnant, call and cancel the procedure. Sickness: If  you have a cold, fever, or any active infections, call and cancel the procedure. Arrival: You must be in the facility at least 30 minutes prior to your scheduled procedure. Children: Do not bring children with you. Dress appropriately: Bring dark clothing that you would not mind if they get stained. Valuables: Do not bring any jewelry or valuables.  Reasons to call and reschedule or cancel your procedure: (Following these recommendations will minimize the risk of a serious complication.) Surgeries: Avoid having procedures within 2 weeks of any surgery. (Avoid for 2 weeks before or after any surgery). Flu Shots: Avoid having procedures within 2 weeks of a flu shots. (Avoid for 2 weeks before or after immunizations). Barium: Avoid having a procedure within 7-10 days after having had a radiological study involving the use of radiological contrast. (Myelograms, Barium swallow or enema study). Heart attacks: Avoid any elective procedures or surgeries for the initial 6 months after a "Myocardial Infarction" (Heart Attack). Blood thinners: It is imperative that you stop these medications before procedures. Let us know if you if you take any blood thinner.  Infection: Avoid procedures during or within two weeks of an infection (including chest colds or gastrointestinal problems). Symptoms associated with infections include: Localized redness, fever, chills, night sweats or profuse sweating, burning sensation when voiding, cough, congestion, stuffiness, runny nose, sore throat, diarrhea, nausea, vomiting, cold or Flu symptoms, recent or current infections. It is specially important if the infection is over the area that we intend to treat. Heart and lung problems: Symptoms that may suggest an active cardiopulmonary problem include: cough, chest pain, breathing difficulties or shortness of breath, dizziness, ankle swelling, uncontrolled high or unusually low blood pressure, and/or palpitations. If you are    experiencing any of these symptoms, cancel your procedure and contact your primary care physician for an evaluation.  Remember:  Regular Business hours are:  Monday to Thursday 8:00 AM to 4:00 PM  Provider's Schedule: Abriana Saltos, MD:  Procedure days: Tuesday and Thursday 7:30 AM to 4:00 PM  Bilal Lateef, MD:  Procedure days: Monday and Wednesday 7:30 AM to 4:00 PM ______________________________________________________________________  ____________________________________________________________________________________________  General Risks and Possible Complications  Patient Responsibilities: It is important that you read this as it is part of your informed consent. It is our duty to inform you of the risks and possible complications associated with treatments offered to you. It is your responsibility as a patient to read this and to ask questions about anything that is not clear or that you believe was not covered in this document.  Patient's Rights: You have the right to refuse treatment. You also have the right to change your mind, even after initially having agreed to have the treatment done. However, under this last option, if you wait until the last second to change your mind, you may be charged for the materials used up to that point.  Introduction: Medicine is not an exact science. Everything in Medicine, including the lack of treatment(s), carries the potential for danger, harm, or loss (which is by definition: Risk). In Medicine, a complication is a secondary problem, condition, or disease that can aggravate an already existing one. All treatments carry the risk of possible complications. The fact that a side effects or complications occurs, does not imply that the treatment was conducted incorrectly. It must be clearly understood that these can happen even when everything is done following the highest safety standards.  No treatment: You can choose not to proceed with the  proposed treatment alternative. The "PRO(s)" would include: avoiding the risk of complications associated with the therapy. The "CON(s)" would include: not getting any of the treatment benefits. These benefits fall under one of three categories: diagnostic; therapeutic; and/or palliative. Diagnostic benefits include: getting information which can ultimately lead to improvement of the disease or symptom(s). Therapeutic benefits are those associated with the successful treatment of the disease. Finally, palliative benefits are those related to the decrease of the primary symptoms, without necessarily curing the condition (example: decreasing the pain from a flare-up of a chronic condition, such as incurable terminal cancer).  General Risks and Complications: These are associated to most interventional treatments. They can occur alone, or in combination. They fall under one of the following six (6) categories: no benefit or worsening of symptoms; bleeding; infection; nerve damage; allergic reactions; and/or death. No benefits or worsening of symptoms: In Medicine there are no guarantees, only probabilities. No healthcare provider can ever guarantee that a medical treatment will work, they can only state the probability that it may. Furthermore, there is always the possibility that the condition may worsen, either directly, or indirectly, as a consequence of the treatment. Bleeding: This is more common if the patient is taking a blood thinner, either prescription or over the counter (example: Goody Powders, Fish oil, Aspirin, Garlic, etc.), or if suffering a condition associated with impaired coagulation (example: Hemophilia, cirrhosis of the liver, low platelet counts, etc.). However, even if you do not have one on these, it can still happen. If you have any of these conditions, or take one of these drugs, make sure to notify your treating physician. Infection: This is more common in patients with a compromised  immune system, either due to disease (example:   diabetes, cancer, human immunodeficiency virus [HIV], etc.), or due to medications or treatments (example: therapies used to treat cancer and rheumatological diseases). However, even if you do not have one on these, it can still happen. If you have any of these conditions, or take one of these drugs, make sure to notify your treating physician. Nerve Damage: This is more common when the treatment is an invasive one, but it can also happen with the use of medications, such as those used in the treatment of cancer. The damage can occur to small secondary nerves, or to large primary ones, such as those in the spinal cord and brain. This damage may be temporary or permanent and it may lead to impairments that can range from temporary numbness to permanent paralysis and/or brain death. Allergic Reactions: Any time a substance or material comes in contact with our body, there is the possibility of an allergic reaction. These can range from a mild skin rash (contact dermatitis) to a severe systemic reaction (anaphylactic reaction), which can result in death. Death: In general, any medical intervention can result in death, most of the time due to an unforeseen complication. ____________________________________________________________________________________________ ____________________________________________________________________________________________  Blood Thinners  IMPORTANT NOTICE:  If you take any of these, make sure to notify the nursing staff.  Failure to do so may result in injury.  Recommended time intervals to stop and restart blood-thinners, before & after invasive procedures  Generic Name Brand Name Pre-procedure. Stop this long before procedure. Post-procedure. Minimum waiting period before restarting.  Abciximab Reopro 15 days 2 hrs  Alteplase Activase 10 days 10 days  Anagrelide Agrylin    Apixaban Eliquis 3 days 6 hrs  Cilostazol Pletal  3 days 5 hrs  Clopidogrel Plavix 7-10 days 2 hrs  Dabigatran Pradaxa 5 days 6 hrs  Dalteparin Fragmin 24 hours 4 hrs  Dipyridamole Aggrenox 11days 2 hrs  Edoxaban Lixiana; Savaysa 3 days 2 hrs  Enoxaparin  Lovenox 24 hours 4 hrs  Eptifibatide Integrillin 8 hours 2 hrs  Fondaparinux  Arixtra 72 hours 12 hrs  Hydroxychloroquine Plaquenil 11 days   Prasugrel Effient 7-10 days 6 hrs  Reteplase Retavase 10 days 10 days  Rivaroxaban Xarelto 3 days 6 hrs  Ticagrelor Brilinta 5-7 days 6 hrs  Ticlopidine Ticlid 10-14 days 2 hrs  Tinzaparin Innohep 24 hours 4 hrs  Tirofiban Aggrastat 8 hours 2 hrs  Warfarin Coumadin 5 days 2 hrs   Other medications with blood-thinning effects  Product indications Generic (Brand) names Note  Cholesterol Lipitor Stop 4 days before procedure  Blood thinner (injectable) Heparin (LMW or LMWH Heparin) Stop 24 hours before procedure  Cancer Ibrutinib (Imbruvica) Stop 7 days before procedure  Malaria/Rheumatoid Hydroxychloroquine (Plaquenil) Stop 11 days before procedure  Thrombolytics  10 days before or after procedures   Over-the-counter (OTC) Products with blood-thinning effects  Product Common names Stop Time  Aspirin > 325 mg Goody Powders, Excedrin, etc. 11 days  Aspirin ? 81 mg  7 days  Fish oil  4 days  Garlic supplements  7 days  Ginkgo biloba  36 hours  Ginseng  24 hours  NSAIDs Ibuprofen, Naprosyn, etc. 3 days  Vitamin E  4 days   ____________________________________________________________________________________________  

## 2021-12-13 DIAGNOSIS — E559 Vitamin D deficiency, unspecified: Secondary | ICD-10-CM | POA: Diagnosis not present

## 2021-12-13 DIAGNOSIS — E039 Hypothyroidism, unspecified: Secondary | ICD-10-CM | POA: Diagnosis not present

## 2021-12-13 DIAGNOSIS — I480 Paroxysmal atrial fibrillation: Secondary | ICD-10-CM | POA: Diagnosis not present

## 2021-12-13 DIAGNOSIS — N189 Chronic kidney disease, unspecified: Secondary | ICD-10-CM | POA: Diagnosis not present

## 2021-12-13 DIAGNOSIS — I10 Essential (primary) hypertension: Secondary | ICD-10-CM | POA: Diagnosis not present

## 2021-12-13 DIAGNOSIS — E785 Hyperlipidemia, unspecified: Secondary | ICD-10-CM | POA: Diagnosis not present

## 2021-12-13 DIAGNOSIS — I5020 Unspecified systolic (congestive) heart failure: Secondary | ICD-10-CM | POA: Diagnosis not present

## 2021-12-21 ENCOUNTER — Emergency Department: Payer: Medicare Other

## 2021-12-21 ENCOUNTER — Other Ambulatory Visit: Payer: Self-pay

## 2021-12-21 ENCOUNTER — Encounter: Payer: Self-pay | Admitting: *Deleted

## 2021-12-21 ENCOUNTER — Inpatient Hospital Stay
Admission: EM | Admit: 2021-12-21 | Discharge: 2021-12-25 | DRG: 602 | Disposition: A | Discharge status: Home Health | Payer: Medicare Other | Attending: Internal Medicine | Admitting: Internal Medicine

## 2021-12-21 DIAGNOSIS — Z8674 Personal history of sudden cardiac arrest: Secondary | ICD-10-CM

## 2021-12-21 DIAGNOSIS — E871 Hypo-osmolality and hyponatremia: Secondary | ICD-10-CM | POA: Diagnosis not present

## 2021-12-21 DIAGNOSIS — Z20822 Contact with and (suspected) exposure to covid-19: Secondary | ICD-10-CM | POA: Diagnosis not present

## 2021-12-21 DIAGNOSIS — E872 Acidosis, unspecified: Secondary | ICD-10-CM

## 2021-12-21 DIAGNOSIS — W5501XA Bitten by cat, initial encounter: Secondary | ICD-10-CM | POA: Diagnosis not present

## 2021-12-21 DIAGNOSIS — W19XXXA Unspecified fall, initial encounter: Secondary | ICD-10-CM | POA: Diagnosis present

## 2021-12-21 DIAGNOSIS — S82401A Unspecified fracture of shaft of right fibula, initial encounter for closed fracture: Secondary | ICD-10-CM | POA: Diagnosis not present

## 2021-12-21 DIAGNOSIS — Z95810 Presence of automatic (implantable) cardiac defibrillator: Secondary | ICD-10-CM | POA: Diagnosis not present

## 2021-12-21 DIAGNOSIS — Y92009 Unspecified place in unspecified non-institutional (private) residence as the place of occurrence of the external cause: Secondary | ICD-10-CM

## 2021-12-21 DIAGNOSIS — Z23 Encounter for immunization: Secondary | ICD-10-CM

## 2021-12-21 DIAGNOSIS — S0990XA Unspecified injury of head, initial encounter: Secondary | ICD-10-CM | POA: Diagnosis not present

## 2021-12-21 DIAGNOSIS — Z885 Allergy status to narcotic agent status: Secondary | ICD-10-CM | POA: Diagnosis not present

## 2021-12-21 DIAGNOSIS — E039 Hypothyroidism, unspecified: Secondary | ICD-10-CM | POA: Diagnosis not present

## 2021-12-21 DIAGNOSIS — G629 Polyneuropathy, unspecified: Secondary | ICD-10-CM | POA: Diagnosis not present

## 2021-12-21 DIAGNOSIS — Z79899 Other long term (current) drug therapy: Secondary | ICD-10-CM

## 2021-12-21 DIAGNOSIS — S82891A Other fracture of right lower leg, initial encounter for closed fracture: Secondary | ICD-10-CM

## 2021-12-21 DIAGNOSIS — F32A Depression, unspecified: Secondary | ICD-10-CM | POA: Diagnosis present

## 2021-12-21 DIAGNOSIS — D509 Iron deficiency anemia, unspecified: Secondary | ICD-10-CM

## 2021-12-21 DIAGNOSIS — Z888 Allergy status to other drugs, medicaments and biological substances status: Secondary | ICD-10-CM

## 2021-12-21 DIAGNOSIS — L089 Local infection of the skin and subcutaneous tissue, unspecified: Secondary | ICD-10-CM | POA: Diagnosis not present

## 2021-12-21 DIAGNOSIS — I5022 Chronic systolic (congestive) heart failure: Secondary | ICD-10-CM

## 2021-12-21 DIAGNOSIS — I480 Paroxysmal atrial fibrillation: Secondary | ICD-10-CM | POA: Diagnosis not present

## 2021-12-21 DIAGNOSIS — E875 Hyperkalemia: Secondary | ICD-10-CM | POA: Diagnosis not present

## 2021-12-21 DIAGNOSIS — E785 Hyperlipidemia, unspecified: Secondary | ICD-10-CM | POA: Diagnosis present

## 2021-12-21 DIAGNOSIS — Z203 Contact with and (suspected) exposure to rabies: Secondary | ICD-10-CM

## 2021-12-21 DIAGNOSIS — S82831A Other fracture of upper and lower end of right fibula, initial encounter for closed fracture: Secondary | ICD-10-CM | POA: Diagnosis not present

## 2021-12-21 DIAGNOSIS — S51851A Open bite of right forearm, initial encounter: Secondary | ICD-10-CM | POA: Diagnosis not present

## 2021-12-21 DIAGNOSIS — N184 Chronic kidney disease, stage 4 (severe): Secondary | ICD-10-CM | POA: Diagnosis not present

## 2021-12-21 DIAGNOSIS — S82409A Unspecified fracture of shaft of unspecified fibula, initial encounter for closed fracture: Secondary | ICD-10-CM

## 2021-12-21 DIAGNOSIS — R296 Repeated falls: Secondary | ICD-10-CM

## 2021-12-21 DIAGNOSIS — L03113 Cellulitis of right upper limb: Secondary | ICD-10-CM | POA: Diagnosis not present

## 2021-12-21 DIAGNOSIS — N1831 Chronic kidney disease, stage 3a: Secondary | ICD-10-CM | POA: Diagnosis present

## 2021-12-21 DIAGNOSIS — Z2914 Encounter for prophylactic rabies immune globin: Secondary | ICD-10-CM

## 2021-12-21 DIAGNOSIS — G9341 Metabolic encephalopathy: Secondary | ICD-10-CM | POA: Diagnosis not present

## 2021-12-21 DIAGNOSIS — Z91048 Other nonmedicinal substance allergy status: Secondary | ICD-10-CM

## 2021-12-21 DIAGNOSIS — Z8249 Family history of ischemic heart disease and other diseases of the circulatory system: Secondary | ICD-10-CM

## 2021-12-21 DIAGNOSIS — K219 Gastro-esophageal reflux disease without esophagitis: Secondary | ICD-10-CM | POA: Diagnosis present

## 2021-12-21 DIAGNOSIS — M7989 Other specified soft tissue disorders: Secondary | ICD-10-CM | POA: Diagnosis not present

## 2021-12-21 DIAGNOSIS — D539 Nutritional anemia, unspecified: Secondary | ICD-10-CM | POA: Diagnosis not present

## 2021-12-21 DIAGNOSIS — S199XXA Unspecified injury of neck, initial encounter: Secondary | ICD-10-CM | POA: Diagnosis not present

## 2021-12-21 DIAGNOSIS — Z79890 Hormone replacement therapy (postmenopausal): Secondary | ICD-10-CM

## 2021-12-21 DIAGNOSIS — R531 Weakness: Secondary | ICD-10-CM

## 2021-12-21 DIAGNOSIS — S51859A Open bite of unspecified forearm, initial encounter: Secondary | ICD-10-CM | POA: Diagnosis not present

## 2021-12-21 DIAGNOSIS — N179 Acute kidney failure, unspecified: Secondary | ICD-10-CM | POA: Diagnosis not present

## 2021-12-21 DIAGNOSIS — I130 Hypertensive heart and chronic kidney disease with heart failure and stage 1 through stage 4 chronic kidney disease, or unspecified chronic kidney disease: Secondary | ICD-10-CM | POA: Diagnosis not present

## 2021-12-21 DIAGNOSIS — I10 Essential (primary) hypertension: Secondary | ICD-10-CM | POA: Diagnosis present

## 2021-12-21 DIAGNOSIS — Z7982 Long term (current) use of aspirin: Secondary | ICD-10-CM

## 2021-12-21 DIAGNOSIS — S92151A Displaced avulsion fracture (chip fracture) of right talus, initial encounter for closed fracture: Secondary | ICD-10-CM | POA: Diagnosis not present

## 2021-12-21 DIAGNOSIS — N178 Other acute kidney failure: Secondary | ICD-10-CM | POA: Diagnosis not present

## 2021-12-21 LAB — CBC WITH DIFFERENTIAL/PLATELET
Abs Immature Granulocytes: 0.08 10*3/uL — ABNORMAL HIGH (ref 0.00–0.07)
Basophils Absolute: 0 10*3/uL (ref 0.0–0.1)
Basophils Relative: 0 %
Eosinophils Absolute: 0 10*3/uL (ref 0.0–0.5)
Eosinophils Relative: 0 %
HCT: 28 % — ABNORMAL LOW (ref 36.0–46.0)
Hemoglobin: 9 g/dL — ABNORMAL LOW (ref 12.0–15.0)
Immature Granulocytes: 1 %
Lymphocytes Relative: 4 %
Lymphs Abs: 0.5 10*3/uL — ABNORMAL LOW (ref 0.7–4.0)
MCH: 33.2 pg (ref 26.0–34.0)
MCHC: 32.1 g/dL (ref 30.0–36.0)
MCV: 103.3 fL — ABNORMAL HIGH (ref 80.0–100.0)
Monocytes Absolute: 0.8 10*3/uL (ref 0.1–1.0)
Monocytes Relative: 6 %
Neutro Abs: 11.2 10*3/uL — ABNORMAL HIGH (ref 1.7–7.7)
Neutrophils Relative %: 89 %
Platelets: 212 10*3/uL (ref 150–400)
RBC: 2.71 MIL/uL — ABNORMAL LOW (ref 3.87–5.11)
RDW: 13.2 % (ref 11.5–15.5)
WBC: 12.6 10*3/uL — ABNORMAL HIGH (ref 4.0–10.5)
nRBC: 0 % (ref 0.0–0.2)

## 2021-12-21 LAB — COMPREHENSIVE METABOLIC PANEL
ALT: 12 U/L (ref 0–44)
AST: 26 U/L (ref 15–41)
Albumin: 4 g/dL (ref 3.5–5.0)
Alkaline Phosphatase: 52 U/L (ref 38–126)
Anion gap: 8 (ref 5–15)
BUN: 29 mg/dL — ABNORMAL HIGH (ref 8–23)
CO2: 18 mmol/L — ABNORMAL LOW (ref 22–32)
Calcium: 8.9 mg/dL (ref 8.9–10.3)
Chloride: 107 mmol/L (ref 98–111)
Creatinine, Ser: 1.81 mg/dL — ABNORMAL HIGH (ref 0.44–1.00)
GFR, Estimated: 29 mL/min — ABNORMAL LOW (ref >60)
Glucose, Bld: 110 mg/dL — ABNORMAL HIGH (ref 70–99)
Potassium: 4.9 mmol/L (ref 3.5–5.1)
Sodium: 133 mmol/L — ABNORMAL LOW (ref 135–145)
Total Bilirubin: 0.6 mg/dL (ref 0.3–1.2)
Total Protein: 7.1 g/dL (ref 6.5–8.1)

## 2021-12-21 LAB — URINALYSIS, ROUTINE W REFLEX MICROSCOPIC
Bilirubin Urine: NEGATIVE
Glucose, UA: NEGATIVE mg/dL
Hgb urine dipstick: NEGATIVE
Ketones, ur: NEGATIVE mg/dL
Leukocytes,Ua: NEGATIVE
Nitrite: NEGATIVE
Protein, ur: 30 mg/dL — AB
Specific Gravity, Urine: 1.023 (ref 1.005–1.030)
pH: 5 (ref 5.0–8.0)

## 2021-12-21 LAB — TROPONIN I (HIGH SENSITIVITY): Troponin I (High Sensitivity): 17 ng/L (ref <18)

## 2021-12-21 LAB — PROTIME-INR
INR: 1.1 (ref 0.8–1.2)
Prothrombin Time: 14.5 seconds (ref 11.4–15.2)

## 2021-12-21 LAB — RESP PANEL BY RT-PCR (FLU A&B, COVID) ARPGX2
Influenza A by PCR: NEGATIVE
Influenza B by PCR: NEGATIVE
SARS Coronavirus 2 by RT PCR: NEGATIVE

## 2021-12-21 LAB — LACTIC ACID, PLASMA: Lactic Acid, Venous: 0.8 mmol/L (ref 0.5–1.9)

## 2021-12-21 MED ORDER — PIPERACILLIN-TAZOBACTAM 3.375 G IVPB 30 MIN
3.3750 g | Freq: Once | INTRAVENOUS | Status: AC
  Administered 2021-12-22: 3.375 g via INTRAVENOUS
  Filled 2021-12-21: qty 50

## 2021-12-21 MED ORDER — HYDROCODONE-ACETAMINOPHEN 5-325 MG PO TABS
1.0000 | ORAL_TABLET | Freq: Once | ORAL | Status: AC
  Administered 2021-12-21: 1 via ORAL
  Filled 2021-12-21: qty 1

## 2021-12-21 MED ORDER — RABIES VACCINE, PCEC IM SUSR
1.0000 mL | Freq: Once | INTRAMUSCULAR | Status: AC
  Administered 2021-12-21: 1 mL via INTRAMUSCULAR
  Filled 2021-12-21: qty 1

## 2021-12-21 MED ORDER — RABIES IMMUNE GLOBULIN 150 UNIT/ML IM INJ
20.0000 [IU]/kg | INJECTION | Freq: Once | INTRAMUSCULAR | Status: AC
Start: 2021-12-21 — End: 2021-12-21
  Administered 2021-12-21: 1425 [IU] via INTRAMUSCULAR
  Filled 2021-12-21: qty 9.5

## 2021-12-21 MED ORDER — ONDANSETRON 8 MG PO TBDP
8.0000 mg | ORAL_TABLET | Freq: Once | ORAL | Status: AC
  Administered 2021-12-21: 8 mg via ORAL
  Filled 2021-12-21: qty 1

## 2021-12-21 NOTE — ED Triage Notes (Signed)
Pt brought in via ems from home with a fall.  Pt has pain and swelling to right ankle.  No neck or back pain.  No loc  pt alert  speech clear.   ?

## 2021-12-21 NOTE — ED Provider Notes (Signed)
[facility] Provider Note  Patient Contact: 8:00 PM (approximate)   History   Fall and Ankle Pain   HPI  [name] is a 76 y.o. female who presents the emergency department complaining of cat bite to the right forearm.  Patient was bit yesterday, by a stray cat.  This bite occurred to the right forearm region.  Patient cleaned it at home but started to have pain and swelling to the area last night.  Patient has complained of increasing weakness and the patient's granddaughters who accompanied the patient to the emergency department reported that she seemed slightly confused today.  They went to the primary care today, was diagnosed with cellulitis of the right forearm.  Patient had sustained 2 falls due to weakness earlier today, as they were returning home from her primary care she fell again injuring her right ankle.  Patient did hit her head during the fall.  She was not unconscious according to the granddaughter's.  Patient is able to provide some history but appears very drowsy and slightly confused.  Her last tetanus shot was last year.  Patient denies any headache, visual changes, chest pain, shortness of breath, nausea, vomiting, diarrhea.  No urinary changes.  Patient is complaining of 10 out of 10 ankle pain states that she cannot ambulate on same at this time.  Ankle is grossly edematous.  No medications prior to arrival.  Patient has not started on the antibiotics from her primary care.  Granddaughters report that the cat is a stray cat, the cat is not up-to-date on rabies vaccination but the cat was acting at his normal self     Physical Exam   Triage Vital Signs: ED Triage Vitals  Enc Vitals Group     BP 12/21/21 1855 124/76     Pulse Rate 12/21/21 1855 73     Resp 12/21/21 1855 20     Temp 12/21/21 1855 98 F (36.7 C)     Temp Source 12/21/21 1855 Oral     SpO2 12/21/21 1855 95 %     Weight 12/21/21 1856 160 lb (72.6 kg)     Height 12/21/21  1856 5\' 4"  (1.626 m)     Head Circumference --      Peak Flow --      Pain Score 12/21/21 1856 10     Pain Loc --      Pain Edu? --      Excl. in [place]? --     Most recent vital signs: Vitals:   12/21/21 2223 12/21/21 2230  BP: 104/71 121/63  Pulse: 67 69  Resp: 20 20  Temp:    SpO2: 99% 94%     General: Alert and in no acute distress. Eyes:  PERRL. EOMI. Head: No acute traumatic findings.  No abrasions, lacerations, ecchymosis.  No hematoma.  Patient is nontender to palpation of the osseous structures of the skull and face.  No battle signs, raccoon eyes, serosanguineous fluid drainage from the ears or nares. ENT:      Ears:       Nose: No congestion/rhinnorhea.      Mouth/Throat: Mucous membranes are moist. Neck: No stridor. No cervical spine tenderness to palpation. Hematological/Lymphatic/Immunilogical: No cervical lymphadenopathy. Cardiovascular:  Good peripheral perfusion Respiratory: Normal respiratory effort without tachypnea or retractions. Lungs CTAB. Good air entry to the bases with no decreased or absent breath sounds. Gastrointestinal: Bowel sounds 4 quadrants. Soft and nontender to palpation. No guarding or rigidity. No  palpable masses. No distention. No CVA tenderness. Musculoskeletal: Full range of motion to all extremities.  Visualization of the right forearm reveals puncture wounds consistent with cat bite to the right forearm.  There is surrounding erythema, edema.  Area is very warm to palpation.  This extends from the wrist joint itself to just distal to the elbow.  Area is again tender to palpation but no palpable abnormality to include fluctuance or induration.  Visualization of the right ankle reveals gross edema.  No deformity.  Patient is unable to bear weight at this time.  Patient does have ecchymosis along the lateral malleolus.  Very tender to palpation over the lateral malleolus, anterior joint line.  There is no palpable deformity.  No tenderness over  palpation over the foot.  Patient has no tenderness to bilateral hips or bilateral knees. Neurologic:  No gross focal neurologic deficits are appreciated.  Cranial nerves II through XII grossly intact at this time. Skin:   No rash noted Other:   ED Results / Procedures / Treatments   Labs (all labs ordered are listed, but only abnormal results are displayed) Labs Reviewed  COMPREHENSIVE METABOLIC PANEL - Abnormal; Notable for the following components:      Result Value   Sodium 133 (*)    CO2 18 (*)    Glucose, Bld 110 (*)    BUN 29 (*)    Creatinine, Ser 1.81 (*)    GFR, Estimated 29 (*)    All other components within normal limits  CBC WITH DIFFERENTIAL/PLATELET - Abnormal; Notable for the following components:   WBC 12.6 (*)    RBC 2.71 (*)    Hemoglobin 9.0 (*)    HCT 28.0 (*)    MCV 103.3 (*)    Neutro Abs 11.2 (*)    Lymphs Abs 0.5 (*)    Abs Immature Granulocytes 0.08 (*)    All other components within normal limits  URINALYSIS, ROUTINE W REFLEX MICROSCOPIC - Abnormal; Notable for the following components:   Color, Urine YELLOW (*)    APPearance CLEAR (*)    Protein, ur 30 (*)    Bacteria, UA RARE (*)    All other components within normal limits  RESP PANEL BY RT-PCR (FLU A&B, COVID) [id number]  URINE CULTURE  CULTURE, BLOOD (ROUTINE X 2)  CULTURE, BLOOD (ROUTINE X 2)  LACTIC ACID, PLASMA  PROTIME-INR  LACTIC ACID, PLASMA  TROPONIN I (HIGH SENSITIVITY)     EKG     RADIOLOGY  I personally viewed and evaluated these images as part of my medical decision making, as well as reviewing the written report by the radiologist.  ED Provider Interpretation: Chest x-ray reveals no acute cardiopulmonary finding.  X-ray of the right ankle reveals a small avulsion fracture at the distal tip of the fibula with overlying soft tissue edema.  CT scan of the head and neck reveal chronic degenerative changes about the cervical spine but no acute intracranial abnormality to  include intracranial hemorrhage or skull fracture.  DG Chest 2 View  Result Date: 12/21/2021 CLINICAL DATA:  Altered mental status EXAM: CHEST - 2 VIEW COMPARISON:  01/20/2021 FINDINGS: Post sternotomy changes with valve prosthesis. Right-sided pacing device as before. No focal opacity, pleural effusion or pneumothorax. Cardiomediastinal silhouette within normal limits. IMPRESSION: No active cardiopulmonary disease. Electronically Signed   By: Donavan Foil M.D.   On: 12/21/2021 21:04   DG Ankle Complete Right  Result Date: 12/21/2021 CLINICAL DATA:  Fall.  Pain and swelling  of right ankle. EXAM: RIGHT ANKLE - COMPLETE 3+ VIEW COMPARISON:  None. FINDINGS: Moderate lateral and mild medial malleolar degenerative osteophytosis. There are two small 3 x 1 mm cortical foci seen at the distal tip of the fibula suspicious for acute avulsion injuries. Minimal distal medial malleolar degenerative osteophytosis. Small plantar calcaneal heel spur. No dislocation. IMPRESSION: Small avulsion fracture at the distal tip of the fibula with moderate lateral malleolar soft tissue swelling. Electronically Signed   By: Yvonne Kendall M.D.   On: 12/21/2021 19:46   CT HEAD WO CONTRAST (5MM)  Result Date: 12/21/2021 CLINICAL DATA:  Fall injury at home today. EXAM: CT HEAD WITHOUT CONTRAST CT CERVICAL SPINE WITHOUT CONTRAST TECHNIQUE: Multidetector CT imaging of the head and cervical spine was performed following the standard protocol without intravenous contrast. Multiplanar CT image reconstructions of the cervical spine were also generated. RADIATION DOSE REDUCTION: This exam was performed according to the departmental dose-optimization program which includes automated exposure control, adjustment of the mA and/or kV according to patient size and/or use of iterative reconstruction technique. COMPARISON:  Most recent head CT dated 10/31/2018, most recent cervical spine CT dated 10/31/2018. FINDINGS: CT HEAD FINDINGS Brain: There is  mild cerebral atrophy, small vessel disease and atrophic ventriculomegaly, stable, with early cerebellar atrophy. No asymmetry is seen concerning for an acute infarct, hemorrhage or mass and there is no midline shift. Basal cisterns are patent. Vascular: There are scattered calcifications of the carotid siphons but no hyperdense central vessels. Skull: Normal. Negative for fracture or focal lesion. Sinuses/Orbits: No acute finding. 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 Other: None. CT CERVICAL SPINE FINDINGS Alignment: Mild chronic cervical levoscoliosis is noted as well as a 2 mm degenerative anterolisthesis at C5-6 and C7-T1, stable. There is no new or suspicious alignment abnormality. Skull base and vertebrae: Osteopenia. There is patchy fluid in the mid to lower right mastoid air cells new from prior study. Left mastoids are clear. The skull base is intact. No cervical fracture is evident. 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 Soft tissues and spinal canal: Calcifications asymmetrically left carotid bifurcation. No significant prevertebral fluid or swelling is seen. No canal hematoma is evident. Disc levels: There are chronic disc calcifications and partial vertebral body ankylosis at C3-4 with ankylosis across the C3-4 posterior elements. There is mild disc space loss with endplate spur formation again C4-5 C5-6, moderate disc space loss and small bidirectional osteophytes C6-7. No spondylotic cord compression or herniated discs or other significant soft tissue or bony encroachment on thecal sac is seen. There is multilevel moderate facet joint and uncinate hypertrophy, asymmetric degenerative arthrosis and partial ankylosis of the right C1-2 lateral mass articulation. There is degenerative foraminal stenosis which is moderate to severe on the right at C2-3, C3-4 and C4-5, severe on the right and mild-to-moderate left C5-6 ,  bilaterally mild C6-7 and moderate on the right at C7-T1. Upper chest: Negative. Other: None. IMPRESSION: 1. No acute intracranial CT findings or interval changes. 2. Osteopenia, degenerative changes and scoliosis of the cervical spine, without evidence fractures or interval changes. 3. Right mastoid effusion new from prior studies. Electronically Signed   By: Telford Nab M.D.   On: 12/21/2021 21:18   CT Cervical Spine Wo Contrast  Result Date: 12/21/2021 CLINICAL DATA:  Fall injury at home today. EXAM: CT HEAD WITHOUT CONTRAST CT CERVICAL SPINE WITHOUT CONTRAST TECHNIQUE: Multidetector CT imaging of the head and cervical spine was  performed following the standard protocol without intravenous contrast. Multiplanar CT image reconstructions of the cervical spine were also generated. RADIATION DOSE REDUCTION: This exam was performed according to the departmental dose-optimization program which includes automated exposure control, adjustment of the mA and/or kV according to patient size and/or use of iterative reconstruction technique. COMPARISON:  Most recent head CT dated 10/31/2018, most recent cervical spine CT dated 10/31/2018. FINDINGS: CT HEAD FINDINGS Brain: There is mild cerebral atrophy, small vessel disease and atrophic ventriculomegaly, stable, with early cerebellar atrophy. No asymmetry is seen concerning for an acute infarct, hemorrhage or mass and there is no midline shift. Basal cisterns are patent. Vascular: There are scattered calcifications of the carotid siphons but no hyperdense central vessels. Skull: Normal. Negative for fracture or focal lesion. Sinuses/Orbits: No acute finding. 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 Other: None. CT CERVICAL SPINE FINDINGS Alignment: Mild chronic cervical levoscoliosis is noted as well as a 2 mm degenerative anterolisthesis at C5-6 and C7-T1, stable. There is no new or suspicious alignment abnormality.  Skull base and vertebrae: Osteopenia. There is patchy fluid in the mid to lower right mastoid air cells new from prior study. Left mastoids are clear. The skull base is intact. No cervical fracture is evident. 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 Soft tissues and spinal canal: Calcifications asymmetrically left carotid bifurcation. No significant prevertebral fluid or swelling is seen. No canal hematoma is evident. Disc levels: There are chronic disc calcifications and partial vertebral body ankylosis at C3-4 with ankylosis across the C3-4 posterior elements. There is mild disc space loss with endplate spur formation again C4-5 C5-6, moderate disc space loss and small bidirectional osteophytes C6-7. No spondylotic cord compression or herniated discs or other significant soft tissue or bony encroachment on thecal sac is seen. There is multilevel moderate facet joint and uncinate hypertrophy, asymmetric degenerative arthrosis and partial ankylosis of the right C1-2 lateral mass articulation. There is degenerative foraminal stenosis which is moderate to severe on the right at C2-3, C3-4 and C4-5, severe on the right and mild-to-moderate left C5-6 , bilaterally mild C6-7 and moderate on the right at C7-T1. Upper chest: Negative. Other: None. IMPRESSION: 1. No acute intracranial CT findings or interval changes. 2. Osteopenia, degenerative changes and scoliosis of the cervical spine, without evidence fractures or interval changes. 3. Right mastoid effusion new from prior studies. Electronically Signed   By: Telford Nab M.D.   On: 12/21/2021 21:18    PROCEDURES:  Critical Care performed: No  Procedures   MEDICATIONS ORDERED IN ED: Medications  piperacillin-tazobactam (ZOSYN) IVPB 3.375 g (has no administration in time range)  HYDROcodone-acetaminophen (NORCO/VICODIN) 5-325 MG per tablet 1 tablet (1 tablet Oral Given 12/21/21 2006)  ondansetron (ZOFRAN-ODT) disintegrating tablet 8 mg (8  mg Oral Given 12/21/21 2008)  rabies vaccine (RABAVERT) injection 1 mL (1 mL Intramuscular Given 12/21/21 2215)  rabies immune globulin (HYPERAB/KEDRAB) injection 1,425 Units (1,425 Units Intramuscular Given 12/21/21 2210)     IMPRESSION / MDM / [place] / ED COURSE  I reviewed the triage vital signs and the nursing notes.                              Differential diagnosis includes, but is not limited to, cellulitis, cat bite injury, need for rabies prophylaxis, ankle fracture, intracranial hemorrhage, skull fracture, cervical spine fracture, CVA, UTI   Patient's diagnosis  is consistent with cat bite to the right forearm, cellulitis, need for rabies prophylaxis, fall, generalized weakness, avulsion fracture of the ankle.  Patient presents the emergency department after developing weakness today secondary to what appears to be an infection from a cat bite.  This injury occurred yesterday with the patient being bit by a stray cat.  The cat is known to the patient, however it does reside outdoors with no owner.  The animal is not up-to-date on immunizations.  Patient started to have increasing pain, edema and erythema to the forearm yesterday.  She developed increasing weakness and slight confusion according to the granddaughter's.  She was taken to her primary care today and diagnosed with cellulitis, prescribed antibiotics but not started same.  Patient has fallen 3 times today which is atypical for her.  She denied any other complaint other than forearm pain and ankle pain.  Given her weakness, altered mental status, signs of infection patient had work-up to include labs which revealed elevated white blood cell count, but her CMP is reassuring.  COVID swab is still pending at this time.  Cultures are still pending as well as urinalysis.  Patient had imaging of her head and neck given the fact that she did hit her head this evening during her fall.  This returned with no acute traumatic findings  and no significant signs for CVA.  Patient is neurologically intact at this time and I do not feel that she needs MRI for possible CVA work-up.  Patient has reassuring chest x-ray, reassuring troponins.  Urinalysis is still pending.  X-ray of the ankle revealed that she has a small avulsion fracture to the lateral malleolus.  Patient is placed in a walking boot for same.  Given the elevation of white blood cell count, source of infection with the right forearm secondary to cat bite I do feel that the patient will require admission as I do not feel that she can safely go home with the generalized weakness, multiple falls, avulsion fracture of the ankle..  Given the fact that this is a stray we have started the rabies series to include immunoglobin and vaccine this evening.  Immunoglobin was administered all IM as patient has significant cellulitis around the bite and I did not want to inoculate through cellulitis with RIG into the wound.  Zosyn is administered for cellulitis.  I will reach out to the hospitalist for antibiotics for cellulitis and her weakness.        FINAL CLINICAL IMPRESSION(S) / ED DIAGNOSES   Final diagnoses:  Cat bite, initial encounter  Cellulitis of right forearm  Multiple falls  Generalized weakness  Closed avulsion fracture of right ankle, initial encounter  Need for prophylactic vaccination against rabies     Rx / DC Orders   ED Discharge Orders     None        Note:  This document was prepared using Dragon voice recognition software and may include unintentional dictation errors.   Darletta Moll, PA-C 12/21/21 2346    Lucrezia Starch, MD 12/21/21 [phone]

## 2021-12-21 NOTE — H&P (Addendum)
History and Physical    Patient: [name] YBO:[id number] DOB: 01-01-1946 DOA: 12/21/2021 DOS: the patient was seen and examined on 12/21/2021 PCP: Leonides Sake, MD  Patient coming from: Home  Chief Complaint:  Chief Complaint  Patient presents with   Fall   Ankle Pain    HPI: [name] is a 76 y.o. female with medical history significant for CKD 3a, B12 deficiency with macrocytic anemia and neuropathy, chronic systolic heart failure (EF 04/2019 to 30 to 35%) with history of V-fib arrest s/p AICD, on amiodarone for prevention of ventricular arrhythmias, HTN and hypothyroidism who presents to the ED following a fall in which she sustained a fibular fracture at the right ankle.  The fall occurred in the setting of altered mental status while being treated for an acute cellulitis of the right forearm that developed from cat bite the day prior.  Patient was feeding a familiar stray cat and got bitten and since then developed a rapidly progressing pain and redness of the right arm, followed by altered mental status.  She was taken to her PCP and sustained a fall after leaving the PCPs office. ED course: Vitals within normal limits WBC 12,000 with lactic acid 0.8.  Hemoglobin 9 with MCV 103.  Creatinine 1.81 above baseline of 1.5.  Bicarb 18.  Urinalysis with rare bacteria.  Troponin 17.  COVID and flu negative CT head and C-spine nonacute.  Chest x-ray with no active disease.  X-ray right ankle showing small avulsion fracture at the distal tip of the fibula with moderate lateral malleolus soft tissue swelling. EKG, personally viewed and interpreted: Junctional rhythm at 68 nonspecific ST-T wave changes Patient started on Zosyn.  Given rabies immunoglobulin and rabies vaccine #1 of 4.  Hospitalist consulted for admission.   Review of Systems: As mentioned in the history of present illness. All other systems reviewed and are negative. Past Medical History:  Diagnosis Date   Anxiety     Arthritis    Automatic implantable cardioverter-defibrillator in situ    Cardiomegaly    CHF (congestive heart failure) ([place]) 05/2013   class III, severe   Chronic bronchitis (HCC)    Depression    GERD (gastroesophageal reflux disease)    Headache(784.0)    migraines   Heart murmur    History of sudden cardiac arrest successfully resuscitated 2011   HLD (hyperlipidemia)    Hypertension    Hypothyroidism    IBS (irritable bowel syndrome)    Paroxysmal atrial fibrillation (HCC)    Pneumonia    Scoliosis    Severe mitral regurgitation 05/2013   s/p MV repair with annuloplasty   Past Surgical History:  Procedure Laterality Date   ABDOMINAL HYSTERECTOMY     APPENDECTOMY     CARDIAC CATHETERIZATION     CARDIAC DEFIBRILLATOR PLACEMENT  2010   Vfib arrest, single chamber ICD   CARPAL TUNNEL RELEASE Right 03/05/2014   Procedure: RIGHT CARPAL TUNNEL RELEASE;  Surgeon: Schuyler Amor, MD;  Location: [place];  Service: Orthopedics;  Laterality: Right;   CATARACT EXTRACTION W/PHACO Left 09/03/2020   Procedure: CATARACT EXTRACTION PHACO AND INTRAOCULAR LENS PLACEMENT ([place]) LEFT;  Surgeon: Birder Robson, MD;  Location: [facility];  Service: Ophthalmology;  Laterality: Left;  Lot # [id number] [place]: 01:04.1 CDE:11:62    CATARACT EXTRACTION W/PHACO Right 12/31/2020   Procedure: CATARACT EXTRACTION PHACO AND INTRAOCULAR LENS PLACEMENT ([facility]) RIGHT;  Surgeon: Birder Robson, MD;  Location: [facility];  Service: Ophthalmology;  Laterality: Right;  Lot #[id number]  [place]: 00:55.8 CDE: 6.29   COLONOSCOPY     FRACTURE SURGERY     HERNIA REPAIR  as child   x 2   INTRAOPERATIVE TRANSESOPHAGEAL ECHOCARDIOGRAM N/A 05/24/2013   Procedure: INTRAOPERATIVE TRANSESOPHAGEAL ECHOCARDIOGRAM;  Surgeon: Ivin Poot, MD;  Location: [place];  Service: Open Heart Surgery;  Laterality: N/A;   LEFT HEART CATHETERIZATION WITH CORONARY ANGIOGRAM N/A 05/16/2013   Procedure: LEFT HEART CATHETERIZATION WITH CORONARY ANGIOGRAM;   Surgeon: Clent Demark, MD;  Location: [place] CATH [facility];  Service: Cardiovascular;  Laterality: N/A;   MITRAL VALVE REPAIR N/A 05/24/2013   Surgeon: Ivin Poot, MD; Service: Open Heart Surgery   MULTIPLE EXTRACTIONS WITH ALVEOLOPLASTY N/A 05/21/2013   Procedure: Extraction of tooth #'s 2,4,18 with alveoloplasty and gross debridement of remaining teeth;  Surgeon: Lenn Cal, DDS;  Location: [place];  Service: Oral Surgery;  Laterality: N/A;   OPEN REDUCTION INTERNAL FIXATION (ORIF) DISTAL RADIAL FRACTURE Right 03/05/2014   Procedure: OPEN REDUCTION INTERNAL FIXATION (ORIF) RIGHT DISTAL RADIUS FRACTURE ;  Surgeon: Schuyler Amor, MD;  Location: [place];  Service: Orthopedics;  Laterality: Right;   TEE WITHOUT CARDIOVERSION  09/24/2012   Procedure: TRANSESOPHAGEAL ECHOCARDIOGRAM (TEE);  Surgeon: Fay Records, MD;  Location[facility];  Service: Cardiovascular;  Laterality: N/A;   Social History:  reports that she has never smoked. She has never used smokeless tobacco. She reports that she does not drink alcohol and does not use drugs.  Allergies  Allergen Reactions   Morphine And Related Itching and Nausea And Vomiting   Codeine Nausea And Vomiting   Demerol [Meperidine Hcl] Nausea And Vomiting   Meperidine Hcl Nausea And Vomiting   Pollen Extract Itching and Other (See Comments)    Seasonal allergies    Family History  Problem Relation Age of Onset   Cancer - Other Mother    CAD Mother    Arthritis Mother    CVA Sister    CAD Sister    Seizures Brother    Heart disease Father     Prior to Admission medications   Medication Sig Start Date End Date Taking? Authorizing Provider  acetaminophen (TYLENOL) 500 MG tablet Take 1,000 mg by mouth every 6 (six) hours as needed (back/neck pain). For pain    [provider]  amiodarone (PACERONE) 200 MG tablet Take amiodarone 200 mg one tablet daily Monday through Saturday.  Do not take amiodarone on Sunday. Patient taking  differently: Take 200 mg by mouth every Monday, Tuesday, Wednesday, Thursday, and Friday. In the morning 04/24/17   Evans Lance, MD  aspirin EC 81 MG tablet Take 81 mg by mouth every other day. Swallow whole.    [provider]  aspirin-acetaminophen-caffeine (EXCEDRIN MIGRAINE) [phone] MG per tablet Take 1 tablet by mouth every 6 (six) hours as needed for headache.    [provider]  Aspirin-Salicylamide-Caffeine (BC HEADACHE POWDER PO) Take 1 Package by mouth 2 (two) times daily as needed (headache/pain).    [provider]  carvedilol (COREG) 3.125 MG tablet Take 1 tablet (3.125 mg total) by mouth 2 (two) times daily with a meal. 01/24/21   Pokhrel, Laxman, MD  Cholecalciferol (VITAMIN D3) 125 MCG (5000 UT) CAPS Take 1 capsule (5,000 Units total) by mouth daily with breakfast. Take along with calcium and magnesium. 12/06/21 03/06/22  Milinda Pointer, MD  ENTRESTO 97-103 MG Take 1 tablet by mouth 2 (two) times daily. 12/04/20   [provider]  ergocalciferol (VITAMIN  D2) 1.25 MG (50000 UT) capsule Take 1 capsule (50,000 Units total) by mouth 2 (two) times a week. X 6 weeks. 12/06/21 01/17/22  Milinda Pointer, MD  gabapentin (NEURONTIN) 100 MG capsule Take 1 capsule (100 mg total) by mouth at bedtime. Patient taking differently: Take 200 mg by mouth at bedtime. 01/24/21   Pokhrel, Corrie Mckusick, MD  Glycerin-Hypromellose-PEG 400 (VISINE DRY EYE) 0.2-0.2-1 % SOLN Place 1-2 drops into both eyes 3 (three) times daily as needed (dry/irritated eyes).    [provider]  hydrOXYzine (ATARAX/VISTARIL) 10 MG tablet Take 10 mg by mouth at bedtime as needed for itching (allergies). 11/04/15   [provider]  levothyroxine (SYNTHROID) 100 MCG tablet Take 100 mcg by mouth daily before breakfast. 02/24/20   [provider]  Multiple Vitamin (MULTIVITAMIN WITH MINERALS) TABS tablet Take 1 tablet by mouth daily. 01/24/21   Pokhrel, Corrie Mckusick, MD  PARoxetine  (PAXIL) 30 MG tablet Take 60 mg by mouth every evening.    [provider]  Polyvinyl Alcohol-Povidone (MURINE TEARS FOR DRY EYES OP) Place 2 drops into both eyes 2 (two) times daily as needed. For dry eyes    [provider]  pravastatin (PRAVACHOL) 40 MG tablet TAKE 1 TABLET BY MOUTH EVERY EVENING. 06/22/21   Evans Lance, MD  sodium chloride (OCEAN) 0.65 % SOLN nasal spray Place 1 spray into both nostrils as needed (dry nasal passages).    [provider]  spironolactone (ALDACTONE) 25 MG tablet Take 0.5 tablets (12.5 mg total) by mouth daily. 01/24/21   Pokhrel, Corrie Mckusick, MD  vitamin B-12 1000 MCG tablet Take 1 tablet (1,000 mcg total) by mouth daily. 01/24/21   Flora Lipps, MD    Physical Exam: Vitals:   12/21/21 1855 12/21/21 1856 12/21/21 2223 12/21/21 2230  BP: 124/76  104/71 121/63  Pulse: 73  67 69  Resp: 20  20 20   Temp: 98 F (36.7 C)     TempSrc: Oral     SpO2: 95%  99% 94%  Weight:  72.6 kg    Height:  5\' 4"  (1.626 m)     Physical Exam Vitals and nursing note reviewed.  Constitutional:      General: She is awake. She is not in acute distress.    Appearance: Normal appearance.  HENT:     Head: Normocephalic and atraumatic.  Cardiovascular:     Rate and Rhythm: Normal rate and regular rhythm.     Pulses: Normal pulses.     Heart sounds: Normal heart sounds. No murmur heard. Pulmonary:     Effort: Pulmonary effort is normal.     Breath sounds: Normal breath sounds. No wheezing or rhonchi.  Abdominal:     General: Bowel sounds are normal.     Palpations: Abdomen is soft.     Tenderness: There is no abdominal tenderness.  Musculoskeletal:        General: No swelling.     Cervical back: Normal range of motion and neck supple.     Right ankle: Swelling present. No tenderness. Decreased range of motion.     Comments: See pic below for right arm cellulitis  Skin:    General: Skin is warm and dry.  Neurological:     General: No focal  deficit present.  Psychiatric:        Mood and Affect: Mood normal.        Behavior: Behavior normal.        Data Reviewed: Relevant notes from primary  care and specialist visits, past discharge summaries as available in EHR, including Care Everywhere. Prior diagnostic testing as pertinent to current admission diagnoses Updated medications and problem lists for reconciliation ED course, including vitals, labs, imaging, treatment and response to treatment Triage notes, nursing and pharmacy notes and ED provider's notes Notable results as noted in HPI   Assessment and Plan: * Infected cat bite of forearm, right, initial encounter Zosyn to cover Pasteurella multocida Keep limb elevated Pain control.  Warm/cool compresses as preferred  Acute metabolic encephalopathy Neurologic checks with fall and aspiration precautions Delirium precautions  Closed fibular fracture Splinted in the ED Pain control Consider inpatient podiatry consult versus outpatient referral  Metabolic acidosis, normal anion gap (NAG) Bicarb of 18, related to acute kidney injury IV hydration and monitor  Acute renal failure superimposed on stage 3a chronic kidney disease (HCC) Likely prerenal and related to poor oral intake related to acute illness and altered mental status Increase oral hydration.  Will avoid IV hydration for now due to systolic heart failure Monitor renal function and avoid nephrotoxins  Need for post exposure prophylaxis for rabies Patient was bitten by a stray cat S/p rabies immunoglobulin and vaccine #1 of 4 in the ED  History of V- fib arrest S/p AICD Continue amiodarone  Chronic systolic CHF (congestive heart failure) (HCC) Currently compensated Continue Entresto, spironolactone and carvedilol  Macrocytic anemia We will check B12 level, in the setting of history of neuropathy and fall  Essential hypertension Continue carvedilol, spironolactone  Hypothyroidism Continue  levothyroxine       Advance Care Planning:   Code Status: Prior   Consults: none  Family Communication: none  Severity of Illness: The appropriate patient status for this patient is INPATIENT. Inpatient status is judged to be reasonable and necessary in order to provide the required intensity of service to ensure the patient's safety. The patient's presenting symptoms, physical exam findings, and initial radiographic and laboratory data in the context of their chronic comorbidities is felt to place them at high risk for further clinical deterioration. Furthermore, it is not anticipated that the patient will be medically stable for discharge from the hospital within 2 midnights of admission.   * I certify that at the point of admission it is my clinical judgment that the patient will require inpatient hospital care spanning beyond 2 midnights from the point of admission due to high intensity of service, high risk for further deterioration and high frequency of surveillance required.*  Author: Athena Masse, MD 12/21/2021 11:58 PM  For on call review [email or web address].

## 2021-12-21 NOTE — ED Notes (Signed)
Pt granddaughter Javier Mamone wants to be called to pick pt up and be added as her emergency contact... her number is [phone]. ? ?Please call her for patient pick up.  ?

## 2021-12-22 DIAGNOSIS — N179 Acute kidney failure, unspecified: Secondary | ICD-10-CM

## 2021-12-22 DIAGNOSIS — Z203 Contact with and (suspected) exposure to rabies: Secondary | ICD-10-CM | POA: Diagnosis not present

## 2021-12-22 DIAGNOSIS — Z23 Encounter for immunization: Secondary | ICD-10-CM | POA: Diagnosis not present

## 2021-12-22 DIAGNOSIS — W5501XA Bitten by cat, initial encounter: Secondary | ICD-10-CM | POA: Diagnosis not present

## 2021-12-22 DIAGNOSIS — E872 Acidosis, unspecified: Secondary | ICD-10-CM | POA: Diagnosis present

## 2021-12-22 DIAGNOSIS — L03113 Cellulitis of right upper limb: Secondary | ICD-10-CM | POA: Diagnosis present

## 2021-12-22 DIAGNOSIS — N178 Other acute kidney failure: Secondary | ICD-10-CM | POA: Diagnosis not present

## 2021-12-22 DIAGNOSIS — S82831A Other fracture of upper and lower end of right fibula, initial encounter for closed fracture: Secondary | ICD-10-CM | POA: Diagnosis not present

## 2021-12-22 DIAGNOSIS — Z888 Allergy status to other drugs, medicaments and biological substances status: Secondary | ICD-10-CM | POA: Diagnosis not present

## 2021-12-22 DIAGNOSIS — D539 Nutritional anemia, unspecified: Secondary | ICD-10-CM | POA: Diagnosis present

## 2021-12-22 DIAGNOSIS — Z95810 Presence of automatic (implantable) cardiac defibrillator: Secondary | ICD-10-CM | POA: Diagnosis not present

## 2021-12-22 DIAGNOSIS — Z20822 Contact with and (suspected) exposure to covid-19: Secondary | ICD-10-CM | POA: Diagnosis present

## 2021-12-22 DIAGNOSIS — S51851A Open bite of right forearm, initial encounter: Secondary | ICD-10-CM | POA: Diagnosis not present

## 2021-12-22 DIAGNOSIS — E875 Hyperkalemia: Secondary | ICD-10-CM | POA: Diagnosis present

## 2021-12-22 DIAGNOSIS — G9341 Metabolic encephalopathy: Secondary | ICD-10-CM | POA: Diagnosis not present

## 2021-12-22 DIAGNOSIS — S82401A Unspecified fracture of shaft of right fibula, initial encounter for closed fracture: Secondary | ICD-10-CM | POA: Diagnosis present

## 2021-12-22 DIAGNOSIS — I130 Hypertensive heart and chronic kidney disease with heart failure and stage 1 through stage 4 chronic kidney disease, or unspecified chronic kidney disease: Secondary | ICD-10-CM | POA: Diagnosis present

## 2021-12-22 DIAGNOSIS — Z2914 Encounter for prophylactic rabies immune globin: Secondary | ICD-10-CM | POA: Diagnosis not present

## 2021-12-22 DIAGNOSIS — Z885 Allergy status to narcotic agent status: Secondary | ICD-10-CM | POA: Diagnosis not present

## 2021-12-22 DIAGNOSIS — E039 Hypothyroidism, unspecified: Secondary | ICD-10-CM | POA: Diagnosis present

## 2021-12-22 DIAGNOSIS — L089 Local infection of the skin and subcutaneous tissue, unspecified: Secondary | ICD-10-CM | POA: Diagnosis not present

## 2021-12-22 DIAGNOSIS — G629 Polyneuropathy, unspecified: Secondary | ICD-10-CM | POA: Diagnosis present

## 2021-12-22 DIAGNOSIS — W19XXXA Unspecified fall, initial encounter: Secondary | ICD-10-CM | POA: Diagnosis present

## 2021-12-22 DIAGNOSIS — N1831 Chronic kidney disease, stage 3a: Secondary | ICD-10-CM

## 2021-12-22 DIAGNOSIS — I5022 Chronic systolic (congestive) heart failure: Secondary | ICD-10-CM | POA: Diagnosis present

## 2021-12-22 DIAGNOSIS — Y92009 Unspecified place in unspecified non-institutional (private) residence as the place of occurrence of the external cause: Secondary | ICD-10-CM | POA: Diagnosis not present

## 2021-12-22 DIAGNOSIS — I480 Paroxysmal atrial fibrillation: Secondary | ICD-10-CM | POA: Diagnosis present

## 2021-12-22 DIAGNOSIS — F32A Depression, unspecified: Secondary | ICD-10-CM | POA: Diagnosis present

## 2021-12-22 DIAGNOSIS — Z8674 Personal history of sudden cardiac arrest: Secondary | ICD-10-CM | POA: Diagnosis not present

## 2021-12-22 DIAGNOSIS — D509 Iron deficiency anemia, unspecified: Secondary | ICD-10-CM | POA: Diagnosis present

## 2021-12-22 DIAGNOSIS — E871 Hypo-osmolality and hyponatremia: Secondary | ICD-10-CM | POA: Diagnosis present

## 2021-12-22 LAB — VITAMIN B12: Vitamin B-12: 548 pg/mL (ref 180–914)

## 2021-12-22 LAB — LACTIC ACID, PLASMA: Lactic Acid, Venous: 0.7 mmol/L (ref 0.5–1.9)

## 2021-12-22 MED ORDER — ENOXAPARIN SODIUM 30 MG/0.3ML IJ SOSY
30.0000 mg | PREFILLED_SYRINGE | INTRAMUSCULAR | Status: DC
  Administered 2021-12-22 – 2021-12-24 (×3): 30 mg via SUBCUTANEOUS
  Filled 2021-12-22 (×3): qty 0.3

## 2021-12-22 MED ORDER — ACETAMINOPHEN 650 MG RE SUPP: 650.0000 mg | Freq: Four times a day (QID) | RECTAL | Status: DC | PRN

## 2021-12-22 MED ORDER — ONDANSETRON HCL 4 MG/2ML IJ SOLN
4.0000 mg | Freq: Four times a day (QID) | INTRAMUSCULAR | Status: DC | PRN
Start: 2021-12-22 — End: 2021-12-25
  Filled 2021-12-22: qty 2

## 2021-12-22 MED ORDER — HYDROCODONE-ACETAMINOPHEN 5-325 MG PO TABS
1.0000 | ORAL_TABLET | ORAL | Status: DC | PRN
  Administered 2021-12-22 (×3): 2 via ORAL
  Administered 2021-12-23 (×2): 1 via ORAL
  Filled 2021-12-22: qty 2
  Filled 2021-12-22: qty 1
  Filled 2021-12-22 (×2): qty 2
  Filled 2021-12-22: qty 1

## 2021-12-22 MED ORDER — ONDANSETRON HCL 4 MG PO TABS
4.0000 mg | ORAL_TABLET | Freq: Four times a day (QID) | ORAL | Status: DC | PRN
  Administered 2021-12-24: 4 mg via ORAL
  Filled 2021-12-22: qty 1

## 2021-12-22 MED ORDER — PIPERACILLIN-TAZOBACTAM 3.375 G IVPB
3.3750 g | Freq: Three times a day (TID) | INTRAVENOUS | Status: DC
  Administered 2021-12-22: 3.375 g via INTRAVENOUS
  Filled 2021-12-22: qty 50

## 2021-12-22 MED ORDER — ACETAMINOPHEN 325 MG PO TABS
650.0000 mg | ORAL_TABLET | Freq: Four times a day (QID) | ORAL | Status: DC | PRN
  Administered 2021-12-22 – 2021-12-24 (×5): 650 mg via ORAL
  Filled 2021-12-22 (×5): qty 2

## 2021-12-22 MED ORDER — SODIUM CHLORIDE 0.9 % IV SOLN
3.0000 g | Freq: Two times a day (BID) | INTRAVENOUS | Status: DC
  Administered 2021-12-22 – 2021-12-23 (×2): 3 g via INTRAVENOUS
  Filled 2021-12-22 (×4): qty 8

## 2021-12-22 NOTE — Assessment & Plan Note (Addendum)
S/p AICD ?Continue amiodarone. ?Discussed with cardiology, AICD interrogation did not show any evidence of ventricular arrhythmia.   ?Patient also needsd replacement of battery for AICD, which has been arranged as outpatient with cardiology ?

## 2021-12-22 NOTE — Assessment & Plan Note (Signed)
Continue levothyroxine 

## 2021-12-22 NOTE — Assessment & Plan Note (Addendum)
Continue some home medicines. ?

## 2021-12-22 NOTE — Progress Notes (Addendum)
?Progress Note ? ? ?Patient: [name] NID:[id number] DOB: 05-14-1946 DOA: 12/21/2021     0 ?DOS: the patient was seen and examined on 12/22/2021 ?  ?Brief hospital course: ?[name] is a 76 y.o. female with medical history significant for CKD 3a, B12 deficiency with macrocytic anemia and neuropathy, chronic systolic heart failure (EF 04/2019 to 30 to 35%) with history of V-fib arrest s/p AICD, on amiodarone for prevention of ventricular arrhythmias, HTN and hypothyroidism who presents to the ED following a fall in which she sustained a fibular fracture at the right ankle.  Patient states that that she did not blackout. ?After arriving the emergency room, patient received rabies immunoglobulin and vaccine #1.  He is also placed on Zosyn for cellulitis. ? ?Assessment and Plan: ?* Infected cat bite of forearm, right, initial encounter ?Change antibiotic to Unasyn.  Continue to follow.  Condition significantly improved. ? ?Acute metabolic encephalopathy ?Condition has improved. ? ?Closed fibular fracture ?Splinted in the ED ?Pain control ?Refer patient to outpatient orthopedics. ? ?Metabolic acidosis, normal anion gap (NAG) ?This is secondary to acute kidney injury.  Received IV fluids, recheck BMP tomorrow. ? ?Acute renal failure superimposed on stage 3a chronic kidney disease ([place]) ?Recheck a BMP tomorrow. ? ?Need for post exposure prophylaxis for rabies ?Patient was bitten by a stray cat ?S/p rabies immunoglobulin and vaccine #1 of 4 in the ED. she will need additional 3 doses all day #3, 7 and they have 14-28. ?Patient had a tetanus shot a year ago, she does not need additional  immunization ? ?History of V- fib arrest ?S/p AICD ?Continue amiodarone. ?Patient did not have a syncope episode at time of admission. ? ?Chronic systolic CHF (congestive heart failure) ([place]) ?Currently compensated ?Continue Entresto, spironolactone and carvedilol ? ?Macrocytic anemia ?We will check B12 level, in the setting of history of  neuropathy and fall ? ?Essential hypertension ?Continue carvedilol, spironolactone ? ?Hypothyroidism ?Continue levothyroxine ? ? ?We will also obtain cardiology consult to interrogate AICD to look at why patient has recurrent fall at time of admission. ? ?Discussed with Dr. Sharlet Salina from orthopedics, no need for surgery, okay for weightbearing. ?  ? ?Subjective:  ?Patient doing much better, right arm redness has improved.  Still has some tenderness, no swelling. ?Still complaining of right ankle pain ?No fever or chills. ?No abdominal pain nausea vomiting or diarrhea ? ?Physical Exam: ?Vitals:  ? 12/22/21 0445 12/22/21 0543 12/22/21 0743 12/22/21 1151  ?BP: (!) 87/50 (!) 112/48 (!) 104/53 (!) 113/95  ?Pulse: 61 63 68 62  ?Resp: 20  18 16   ?Temp: 98.9 ?F (37.2 ?C)  98 ?F (36.7 ?C) 97.6 ?F (36.4 ?C)  ?TempSrc:      ?SpO2: 92%  93% 96%  ?Weight:      ?Height:      ? ?General exam: Appears calm and comfortable  ?Respiratory system: Clear to auscultation. Respiratory effort normal. ?Cardiovascular system: S1 & S2 heard, RRR. No JVD, murmurs, rubs, gallops or clicks. No pedal edema. ?Gastrointestinal system: Abdomen is nondistended, soft and nontender. No organomegaly or masses felt. Normal bowel sounds heard. ?Central nervous system: Alert and oriented. No focal neurological deficits. ?Extremities: Mild redness, no swelling or tenderness. ?Skin: No rashes, lesions or ulcers ?Psychiatry: Judgement and insight appear normal. Mood & affect appropriate.  ? ?Data Reviewed: ?Reviewed all imaging studies, reviewed all lab results ? ?Family Communication: Granddaughter updated. ? ?Disposition: ?Status is: Inpatient ?Remains inpatient appropriate because: Severity of disease ? Planned Discharge Destination: Home with  Home Health ? ? ? ?Time spent: 38 minutes ? ?Author: ?Sharen Hones, MD ?12/22/2021 1:20 PM ? ?For on call review [email or web address].  ?

## 2021-12-22 NOTE — Assessment & Plan Note (Addendum)
Continue home dose of B12.  B12 level within normal range. ?

## 2021-12-22 NOTE — Progress Notes (Signed)
PHARMACIST - PHYSICIAN COMMUNICATION ? ?CONCERNING:  Enoxaparin (Lovenox) for DVT Prophylaxis  ? ? ?RECOMMENDATION: ?Patient was prescribed enoxaprin 40mg  q24 hours for VTE prophylaxis.  ? Danley Danker Weights  ? 12/21/21 1856  ?Weight: 72.6 kg (160 lb)  ? ? ?Body mass index is 27.46 kg/m?. ? ?Estimated Creatinine Clearance: 26.6 mL/min (A) (by C-G formula based on SCr of 1.81 mg/dL (H)). ? ?Patient is candidate for enoxaparin 30mg  every 24 hours based on CrCl <72ml/min or Weight <45kg ? ?DESCRIPTION: ?Pharmacy has adjusted enoxaparin dose per [facility] policy. ? ?Patient is now receiving enoxaparin 30 mg every 24 hours  ? ?Renda Rolls, PharmD, MBA ?12/22/2021 ?12:20 AM ? ?

## 2021-12-22 NOTE — Hospital Course (Signed)
[name] is a 76 y.o. female with medical history significant for CKD 3a, B12 deficiency with macrocytic anemia and neuropathy, chronic systolic heart failure (EF 04/2019 to 30 to 35%) with history of V-fib arrest s/p AICD, on amiodarone for prevention of ventricular arrhythmias, HTN and hypothyroidism who presents to the ED following a fall in which she sustained a fibular fracture at the right ankle.  Patient states that that she did not blackout. ?After arriving the emergency room, patient received rabies immunoglobulin and vaccine #1.  He is also placed on Zosyn for cellulitis. ?

## 2021-12-22 NOTE — Assessment & Plan Note (Addendum)
Splinted in the ED ?Pain control ?Refer patient to outpatient orthopedics.  Discussed with Dr. Sharlet Salina, no surgery needed, okay for weightbearing. ?

## 2021-12-22 NOTE — Evaluation (Signed)
Physical Therapy Evaluation Patient Details Name: [name] MRN: [id number] DOB: 15-Feb-1946 Today's Date: 12/22/2021  History of Present Illness  Pt is a 76 y.o. female with medical history significant for CKD 3a, B12 deficiency with macrocytic anemia and neuropathy, chronic systolic heart failure (EF 04/2019 30-35%), V-fib arrest s/p AICD, HTN and hypothyroidism who presents to the ED following a fall in which she sustained a fibular fracture at the right ankle.  The fall occurred in the setting of altered mental status while being treated for an acute cellulitis of the right forearm that developed from cat bite.  MD assessment also includes metabolic acidosis, normal anion gap, acute renal failure, and macrocytic anemia.   Clinical Impression  Pt anxious and reluctant to participate with PT services initially but after extensive education on physiological benefits of activity and encouragement the pt participated and put forth good effort during the session.  Pt required physical assistance with all functional tasks and was highly guarded with RLE movement.  Ultimately the pt was able to come to standing and take several very small, shuffling steps at the EOB.  Of note pt did state that she felt her pain had improved after getting up and mobilizing. Pt will benefit from PT services in a SNF setting upon discharge to safely address deficits listed in patient problem list for decreased caregiver assistance and eventual return to PLOF.        Recommendations for follow up therapy are one component of a multi-disciplinary discharge planning process, led by the attending physician.  Recommendations may be updated based on patient status, additional functional criteria and insurance authorization.  Follow Up Recommendations Skilled nursing-short term rehab (<3 hours/day)    Assistance Recommended at Discharge Frequent or constant Supervision/Assistance  Patient can return home with the following  Two  people to help with walking and/or transfers;A lot of help with bathing/dressing/bathroom;Assistance with cooking/housework;Direct supervision/assist for medications management;Assist for transportation;Help with stairs or ramp for entrance    Equipment Recommendations None recommended by PT  Recommendations for Other Services       Functional Status Assessment Patient has had a recent decline in their functional status and demonstrates the ability to make significant improvements in function in a reasonable and predictable amount of time.     Precautions / Restrictions Precautions Precautions: Fall Restrictions Weight Bearing Restrictions: Yes RLE Weight Bearing: Weight bearing as tolerated Other Position/Activity Restrictions: Walking boot in room.  Per ortho MD, boot is for comfort only, may WB without      Mobility  Bed Mobility Overal bed mobility: Needs Assistance Bed Mobility: Supine to Sit, Sit to Supine     Supine to sit: Min assist Sit to supine: Min assist   General bed mobility comments: Min A for RLE and trunk control    Transfers Overall transfer level: Needs assistance Equipment used: Rolling walker (2 wheels) Transfers: Sit to/from Stand Sit to Stand: From elevated surface, Min assist           General transfer comment: Mod verbal cues for sequencing    Ambulation/Gait Ambulation/Gait assistance: Min assist Gait Distance (Feet): 1 Feet Assistive device: Rolling walker (2 wheels) Gait Pattern/deviations: Step-to pattern, Antalgic, Trunk flexed, Decreased stance time - right, Shuffle Gait velocity: decreased     General Gait Details: Pt only able to take several very small, effortful, shuffling steps at the EOB before needing to return to sitting secondary to fatigue; pt had difficulty advancing either LE but more so the LLE secondary  to pain with RLE WB; of note pt did state that she felt her pain had improved after getting up and  mobilizing  Stairs            Wheelchair Mobility    Modified Rankin (Stroke Patients Only)       Balance Overall balance assessment: Needs assistance Sitting-balance support: Bilateral upper extremity supported, Feet unsupported Sitting balance-Leahy Scale: Good     Standing balance support: Bilateral upper extremity supported, During functional activity, Reliant on assistive device for balance Standing balance-Leahy Scale: Poor                               Pertinent Vitals/Pain Pain Assessment Pain Assessment: 0-10 Pain Score: 10-Worst pain ever Pain Descriptors / Indicators: Aching, Guarding, Grimacing Pain Intervention(s): Repositioned, Premedicated before session, Monitored during session, Ice applied    Home Living Family/patient expects to be discharged to:: Private residence Living Arrangements: Other relatives Available Help at Discharge: Family;Available PRN/intermittently Type of Home: Mobile home Home Access: Stairs to enter Entrance Stairs-Rails: Can reach both;Right;Left Entrance Stairs-Number of Steps: 4   Home Layout: One level Home Equipment: Conservation officer, nature (2 wheels);Rollator (4 wheels);BSC/3in1;Shower seat      Prior Function Prior Level of Function : Independent/Modified Independent             Mobility Comments: Ind amb mostly without an AD with PRN rollator use, community ambulator, no other fall history ADLs Comments: Ind with ADLs     Hand Dominance   Dominant Hand: Right    Extremity/Trunk Assessment   Upper Extremity Assessment Upper Extremity Assessment: Generalized weakness    Lower Extremity Assessment Lower Extremity Assessment: Generalized weakness    Cervical / Trunk Assessment Cervical / Trunk Assessment: Other exceptions Cervical / Trunk Exceptions: scoliosis  Communication   Communication: No difficulties  Cognition Arousal/Alertness: Awake/alert Behavior During Therapy: Anxious Overall  Cognitive Status: Within Functional Limits for tasks assessed                                          General Comments      Exercises Total Joint Exercises Ankle Circles/Pumps: AROM, Strengthening, Left, 10 reps Quad Sets: Strengthening, Both, 10 reps Hip ABduction/ADduction: AAROM, Strengthening, Right, 5 reps Straight Leg Raises: AAROM, Strengthening, Right, 5 reps Long Arc Quad: AROM, Strengthening, Both, 10 reps Knee Flexion: 10 reps, Both, Strengthening, AROM Marching in Standing: AROM, Strengthening, Right, 5 reps, Standing Other Exercises Other Exercises: Extensive pt education on physiological benefits of activity   Assessment/Plan    PT Assessment Patient needs continued PT services  PT Problem List Decreased strength;Decreased activity tolerance;Decreased balance;Decreased mobility;Decreased knowledge of use of DME;Pain       PT Treatment Interventions DME instruction;Gait training;Stair training;Functional mobility training;Therapeutic activities;Therapeutic exercise;Balance training;Patient/family education    PT Goals (Current goals can be found in the Care Plan section)  Acute Rehab PT Goals Patient Stated Goal: To be able to run errands and be independent again PT Goal Formulation: With patient Time For Goal Achievement: 01/04/22 Potential to Achieve Goals: Good    Frequency 7X/week     Co-evaluation               AM-PAC PT "6 Clicks" Mobility  Outcome Measure Help needed turning from your back to your side while in a flat bed without using  bedrails?: A Little Help needed moving from lying on your back to sitting on the side of a flat bed without using bedrails?: A Little Help needed moving to and from a bed to a chair (including a wheelchair)?: A Lot Help needed standing up from a chair using your arms (e.g., wheelchair or bedside chair)?: A Little Help needed to walk in hospital room?: Total Help needed climbing 3-5 steps with a  railing? : Total 6 Click Score: 13    End of Session Equipment Utilized During Treatment: Gait belt Activity Tolerance: Patient tolerated treatment well;No increased pain Patient left: in bed;with call bell/phone within reach;with bed alarm set Nurse Communication: Mobility status;Weight bearing status PT Visit Diagnosis: Unsteadiness on feet (R26.81);Other abnormalities of gait and mobility (R26.89);Muscle weakness (generalized) (M62.81);Pain Pain - Right/Left: Right Pain - part of body: Ankle and joints of foot    Time: 1093-2355 PT Time Calculation (min) (ACUTE ONLY): 36 min   Charges:   PT Evaluation $PT Eval Moderate Complexity: 1 Mod PT Treatments $Therapeutic Exercise: 8-22 mins $Therapeutic Activity: 8-22 mins       D. Royetta Asal PT, DPT 12/22/21, 4:13 PM

## 2021-12-22 NOTE — Plan of Care (Signed)
?  Problem: Clinical Measurements: ?Goal: Ability to avoid or minimize complications of infection will improve ?Outcome: Progressing ?  ?Problem: Skin Integrity: ?Goal: Skin integrity will improve ?Outcome: Progressing ?  ?Problem: Health Behavior/Discharge Planning: ?Goal: Ability to manage health-related needs will improve ?Outcome: Progressing ?  ?Problem: Clinical Measurements: ?Goal: Ability to maintain clinical measurements within normal limits will improve ?Outcome: Progressing ?  ?Problem: Clinical Measurements: ?Goal: Will remain free from infection ?Outcome: Progressing ?  ?

## 2021-12-22 NOTE — Assessment & Plan Note (Addendum)
Currently compensated ?Hold off  Aldactone due to  mild hyperkalemia. ?Resume Entresto, renal function has improved. ?

## 2021-12-22 NOTE — Progress Notes (Signed)
Pharmacy Antibiotic Note ? ?[name] is a 76 y.o. female admitted on 12/21/2021 with cellulitis (cat bite)  Pharmacy has been consulted for Zosyn dosing. ? ?Plan: ?Zosyn 3.375g IV q8h (4 hour infusion).  Pharmacy will continue to follow and will adjust abx dosing when warranted. ? ?Height: 5\' 4"  (162.6 cm) ?Weight: 72.6 kg (160 lb) ?IBW/kg (Calculated) : 54.7 ? ?Temp (24hrs), Avg:98 ?F (36.7 ?C), Min:98 ?F (36.7 ?C), Max:98 ?F (36.7 ?C) ? ?Recent Labs  ?Lab 12/21/21 ?2116  ?WBC 12.6*  ?CREATININE 1.81*  ?LATICACIDVEN 0.8  ?  ?Estimated Creatinine Clearance: 26.6 mL/min (A) (by C-G formula based on SCr of 1.81 mg/dL (H)).   ? ?Allergies  ?Allergen Reactions  ? Morphine And Related Itching and Nausea And Vomiting  ? Codeine Nausea And Vomiting  ? Demerol [Meperidine Hcl] Nausea And Vomiting  ? Meperidine Hcl Nausea And Vomiting  ? Pollen Extract Itching and Other (See Comments)  ?  Seasonal allergies  ? ? ?Antimicrobials this admission: ?3/08 Zosyn >>  ? ?Microbiology results: ?3/7 BCx: Pending ?3/7 UCx: Pending  ? ?Thank you for allowing pharmacy to be a part of this patient?s care. ? ?Renda Rolls, PharmD, MBA ?12/22/2021 ?12:26 AM ? ? ?

## 2021-12-22 NOTE — Assessment & Plan Note (Addendum)
Condition has improved. ?

## 2021-12-22 NOTE — Assessment & Plan Note (Addendum)
Renal function is better after giving fluids. ?

## 2021-12-22 NOTE — Assessment & Plan Note (Addendum)
Condition is improved, will complete 7 days of antibiotics. ?

## 2021-12-22 NOTE — Plan of Care (Signed)
Patient arrived to room 136 in stable condition. Aox4. Reports 10/10 pain to right arm and right ankle. Instructed by Dr. Damita Dunnings to apply ace wrap and ice to right ankle. RLE elevated on pillows. Plan of care reviewed with patient. Call bell usage explained. Bed in lowest position. ? ? ?Problem: Clinical Measurements: ?Goal: Ability to avoid or minimize complications of infection will improve ?Outcome: Progressing ?  ?Problem: Skin Integrity: ?Goal: Skin integrity will improve ?Outcome: Progressing ?  ?Problem: Education: ?Goal: Knowledge of General Education information will improve ?Description: Including pain rating scale, medication(s)/side effects and non-pharmacologic comfort measures ?Outcome: Progressing ?  ?Problem: Health Behavior/Discharge Planning: ?Goal: Ability to manage health-related needs will improve ?Outcome: Progressing ?  ?Problem: Clinical Measurements: ?Goal: Ability to maintain clinical measurements within normal limits will improve ?Outcome: Progressing ?Goal: Will remain free from infection ?Outcome: Progressing ?Goal: Diagnostic test results will improve ?Outcome: Progressing ?Goal: Respiratory complications will improve ?Outcome: Progressing ?Goal: Cardiovascular complication will be avoided ?Outcome: Progressing ?  ?Problem: Activity: ?Goal: Risk for activity intolerance will decrease ?Outcome: Progressing ?  ?Problem: Nutrition: ?Goal: Adequate nutrition will be maintained ?Outcome: Progressing ?  ?Problem: Coping: ?Goal: Level of anxiety will decrease ?Outcome: Progressing ?  ?Problem: Elimination: ?Goal: Will not experience complications related to bowel motility ?Outcome: Progressing ?Goal: Will not experience complications related to urinary retention ?Outcome: Progressing ?  ?Problem: Pain Managment: ?Goal: General experience of comfort will improve ?Outcome: Progressing ?  ?Problem: Safety: ?Goal: Ability to remain free from injury will improve ?Outcome: Progressing ?  ?Problem:  Skin Integrity: ?Goal: Risk for impaired skin integrity will decrease ?Outcome: Progressing ?  ?

## 2021-12-22 NOTE — Assessment & Plan Note (Addendum)
Patient was bitten by a stray cat ?S/p rabies immunoglobulin and vaccine #1 of 4 in the ED. patient received the second dose 3/11.  3d dose 3/14 and 4th dose between 3/21 to 3/28.   ?Patient had a tetanus shot a year ago, she does not need additional  Immunization. ? ?

## 2021-12-22 NOTE — Assessment & Plan Note (Addendum)
This is secondary to acute kidney injury.  Condition had improved. ?

## 2021-12-23 ENCOUNTER — Telehealth: Payer: Self-pay

## 2021-12-23 DIAGNOSIS — N178 Other acute kidney failure: Secondary | ICD-10-CM

## 2021-12-23 DIAGNOSIS — N1831 Chronic kidney disease, stage 3a: Secondary | ICD-10-CM

## 2021-12-23 DIAGNOSIS — E871 Hypo-osmolality and hyponatremia: Secondary | ICD-10-CM

## 2021-12-23 LAB — BASIC METABOLIC PANEL
Anion gap: 6 (ref 5–15)
BUN: 34 mg/dL — ABNORMAL HIGH (ref 8–23)
CO2: 22 mmol/L (ref 22–32)
Calcium: 8.4 mg/dL — ABNORMAL LOW (ref 8.9–10.3)
Chloride: 103 mmol/L (ref 98–111)
Creatinine, Ser: 1.94 mg/dL — ABNORMAL HIGH (ref 0.44–1.00)
GFR, Estimated: 27 mL/min — ABNORMAL LOW (ref >60)
Glucose, Bld: 105 mg/dL — ABNORMAL HIGH (ref 70–99)
Potassium: 5.2 mmol/L — ABNORMAL HIGH (ref 3.5–5.1)
Sodium: 131 mmol/L — ABNORMAL LOW (ref 135–145)

## 2021-12-23 LAB — CBC WITH DIFFERENTIAL/PLATELET
Abs Immature Granulocytes: 0.05 10*3/uL (ref 0.00–0.07)
Basophils Absolute: 0 10*3/uL (ref 0.0–0.1)
Basophils Relative: 0 %
Eosinophils Absolute: 0.2 10*3/uL (ref 0.0–0.5)
Eosinophils Relative: 2 %
HCT: 25.3 % — ABNORMAL LOW (ref 36.0–46.0)
Hemoglobin: 8.2 g/dL — ABNORMAL LOW (ref 12.0–15.0)
Immature Granulocytes: 1 %
Lymphocytes Relative: 9 %
Lymphs Abs: 0.7 10*3/uL (ref 0.7–4.0)
MCH: 33.2 pg (ref 26.0–34.0)
MCHC: 32.4 g/dL (ref 30.0–36.0)
MCV: 102.4 fL — ABNORMAL HIGH (ref 80.0–100.0)
Monocytes Absolute: 0.7 10*3/uL (ref 0.1–1.0)
Monocytes Relative: 9 %
Neutro Abs: 6.4 10*3/uL (ref 1.7–7.7)
Neutrophils Relative %: 79 %
Platelets: 178 10*3/uL (ref 150–400)
RBC: 2.47 MIL/uL — ABNORMAL LOW (ref 3.87–5.11)
RDW: 13.1 % (ref 11.5–15.5)
WBC: 8 10*3/uL (ref 4.0–10.5)
nRBC: 0 % (ref 0.0–0.2)

## 2021-12-23 LAB — URINE CULTURE: Culture: <10000 — AB

## 2021-12-23 LAB — IRON AND TIBC
Iron: 14 ug/dL — ABNORMAL LOW (ref 28–170)
Saturation Ratios: 5 % — ABNORMAL LOW (ref 10.4–31.8)
TIBC: 263 ug/dL (ref 250–450)
UIBC: 249 ug/dL

## 2021-12-23 LAB — MAGNESIUM: Magnesium: 2 mg/dL (ref 1.7–2.4)

## 2021-12-23 LAB — FERRITIN: Ferritin: 124 ng/mL (ref 11–307)

## 2021-12-23 MED ORDER — POLYVINYL ALCOHOL 1.4 % OP SOLN
1.0000 [drp] | Freq: Three times a day (TID) | OPHTHALMIC | Status: DC | PRN
  Filled 2021-12-23: qty 15

## 2021-12-23 MED ORDER — SODIUM CHLORIDE 0.9 % IV SOLN: INTRAVENOUS | Status: DC

## 2021-12-23 MED ORDER — AMOXICILLIN-POT CLAVULANATE 500-125 MG PO TABS
1.0000 | ORAL_TABLET | Freq: Two times a day (BID) | ORAL | Status: DC
  Administered 2021-12-23 – 2021-12-24 (×4): 500 mg via ORAL
  Filled 2021-12-23 (×5): qty 1

## 2021-12-23 MED ORDER — SALINE SPRAY 0.65 % NA SOLN
1.0000 | NASAL | Status: DC | PRN
  Filled 2021-12-23: qty 44

## 2021-12-23 MED ORDER — PAROXETINE HCL 20 MG PO TABS
60.0000 mg | ORAL_TABLET | Freq: Every evening | ORAL | Status: DC
  Administered 2021-12-23 – 2021-12-24 (×2): 60 mg via ORAL
  Filled 2021-12-23 (×2): qty 3

## 2021-12-23 MED ORDER — GABAPENTIN 100 MG PO CAPS
100.0000 mg | ORAL_CAPSULE | Freq: Every day | ORAL | Status: DC
  Filled 2021-12-23: qty 1

## 2021-12-23 MED ORDER — AMIODARONE HCL 200 MG PO TABS
200.0000 mg | ORAL_TABLET | ORAL | Status: DC
  Administered 2021-12-23: 12:00:00 200 mg via ORAL
  Filled 2021-12-23: qty 1

## 2021-12-23 MED ORDER — LEVOTHYROXINE SODIUM 50 MCG PO TABS
100.0000 ug | ORAL_TABLET | Freq: Every day | ORAL | Status: DC
  Administered 2021-12-24 – 2021-12-25 (×2): 100 ug via ORAL
  Filled 2021-12-23 (×2): qty 2

## 2021-12-23 MED ORDER — VITAMIN B-12 1000 MCG PO TABS
1000.0000 ug | ORAL_TABLET | Freq: Every day | ORAL | Status: DC
  Administered 2021-12-23 – 2021-12-25 (×3): 1000 ug via ORAL
  Filled 2021-12-23 (×3): qty 1

## 2021-12-23 MED ORDER — PRAVASTATIN SODIUM 20 MG PO TABS
40.0000 mg | ORAL_TABLET | Freq: Every evening | ORAL | Status: DC
  Administered 2021-12-23 – 2021-12-24 (×2): 40 mg via ORAL
  Filled 2021-12-23 (×2): qty 2

## 2021-12-23 MED ORDER — ASPIRIN EC 81 MG PO TBEC
81.0000 mg | DELAYED_RELEASE_TABLET | ORAL | Status: DC
Start: 2021-12-23 — End: 2021-12-25
  Administered 2021-12-23 – 2021-12-25 (×2): 81 mg via ORAL
  Filled 2021-12-23 (×2): qty 1

## 2021-12-23 MED ORDER — FERROUS SULFATE 325 (65 FE) MG PO TABS
325.0000 mg | ORAL_TABLET | Freq: Every day | ORAL | Status: DC
  Administered 2021-12-24: 325 mg via ORAL
  Filled 2021-12-23 (×3): qty 1

## 2021-12-23 MED ORDER — SENNOSIDES-DOCUSATE SODIUM 8.6-50 MG PO TABS
2.0000 | ORAL_TABLET | Freq: Two times a day (BID) | ORAL | Status: DC
  Administered 2021-12-23 – 2021-12-24 (×2): 2 via ORAL
  Filled 2021-12-23 (×4): qty 2

## 2021-12-23 MED ORDER — CARVEDILOL 3.125 MG PO TABS
3.1250 mg | ORAL_TABLET | Freq: Two times a day (BID) | ORAL | Status: DC
  Administered 2021-12-23 – 2021-12-25 (×3): 3.125 mg via ORAL
  Filled 2021-12-23 (×4): qty 1

## 2021-12-23 NOTE — Telephone Encounter (Signed)
MOD ?Alert remote reviewed. Normal device function.   ?The device reached RRT on 12/05/2021, sent to triage ?Next in-clinic 01/06/2022. ? ?Alert received for ERI. EMR reviewed. Patient currently hospitalized (admitted 3/7) for cat bite, cellulitis, multiple falls, and ankle fx. Next in clinic follow up with Tommye Standard, EP App 01/06/22. Will route to App and Dr. Tanna Furry RN as Juluis Rainier.  ? ?

## 2021-12-23 NOTE — Assessment & Plan Note (Addendum)
Received gentle rehydration, sodium level better. ?

## 2021-12-23 NOTE — Progress Notes (Signed)
Physical Therapy Treatment ?Patient Details ?Name: [name] ?MRN: [id number] ?DOB: [birth year] ?Today's Date: 12/23/2021 ? ? ?History of Present Illness Pt is a 76 y.o. female with medical history significant for CKD 3a, B12 deficiency with macrocytic anemia and neuropathy, chronic systolic heart failure (EF 04/2019 30-35%), V-fib arrest s/p AICD, HTN and hypothyroidism who presents to the ED following a fall in which she sustained a fibular fracture at the right ankle.  The fall occurred in the setting of altered mental status while being treated for an acute cellulitis of the right forearm that developed from cat bite.  MD assessment also includes metabolic acidosis, normal anion gap, acute renal failure, and macrocytic anemia. ? ?  ?PT Comments  ? ? Pt initially very anxious but able to tolerate mobility and exercises and gait much better than yesterday's effort on the exam.  She managed to do R AROM SLRs against gravity, get to sitting w/o direct assist and ultimately showed ability to WB through R well enough to walk ~20 ft in the room with heavy walker reliance.  She is eager to get home (not rehab) but understands that as she would be home alone much of the day that she would not likely be able to care for herself safely until she is moving much better and has better activity/WBing tolerance.  Per progress it is conceivable that she could get home (as she would prefer), but much would need to improve considerably before this is a safe option.    ?Recommendations for follow up therapy are one component of a multi-disciplinary discharge planning process, led by the attending physician.  Recommendations may be updated based on patient status, additional functional criteria and insurance authorization. ? ?Follow Up Recommendations ? Skilled nursing-short term rehab (<3 hours/day) ?  ?  ?Assistance Recommended at Discharge Frequent or constant Supervision/Assistance  ?Patient can return home with the following A  little help with walking and/or transfers;A little help with bathing/dressing/bathroom;Assistance with cooking/housework;Assist for transportation;Help with stairs or ramp for entrance ?  ?Equipment Recommendations ? None recommended by PT  ?  ?Recommendations for Other Services   ? ? ?  ?Precautions / Restrictions Precautions ?Precautions: Fall ?Restrictions ?RLE Weight Bearing: Weight bearing as tolerated  ?  ? ?Mobility ? Bed Mobility ?Overal bed mobility: Needs Assistance ?Bed Mobility: Supine to Sit ?  ?  ?Supine to sit: Supervision ?  ?  ?General bed mobility comments: Pt was able to rise to sitting EOB w/o assist, sensitivity with brushing/rubbing R ankle area but able to move w/o phyiscal assist assist ?  ? ?Transfers ?Overall transfer level: Needs assistance ?Equipment used: Rolling walker (2 wheels) ?Transfers: Sit to/from Stand ?Sit to Stand: From elevated surface, Min assist ?  ?  ?  ?  ?  ?General transfer comment: Pt was able to do multiple sit to stand efforts t/o session.  On first she was unable to rise w/o assist and needed min/mod direct assist.  However later in the session she was more confident and had warmed up and was able to rise with only CGA and cuing for appropriate strategy. ?  ? ?Ambulation/Gait ?Ambulation/Gait assistance: Min assist ?Gait Distance (Feet): 20 Feet ?Assistive device: Rolling walker (2 wheels) ?  ?  ?  ?  ?General Gait Details: Pt was able to do much better with ambulation/mobility this date.  She showed great effort and though she initially did not tolerate putting R heel down/much WBing with increased distance and effort she improved and  was able to do 2 seperate bouts of ambulation of 6 ft and then after seated rest break ~57ft.  Pt reliant on the walker and hesitant with R WBing but much improved per yesterday's functional mobility tolerance ? ? ?Stairs ?  ?  ?  ?  ?  ? ? ?Wheelchair Mobility ?  ? ?Modified Rankin (Stroke Patients Only) ?  ? ? ?  ?Balance Overall  balance assessment: Needs assistance ?Sitting-balance support: Bilateral upper extremity supported, Feet unsupported ?Sitting balance-Leahy Scale: Good ?  ?  ?Standing balance support: Bilateral upper extremity supported, During functional activity, Reliant on assistive device for balance ?Standing balance-Leahy Scale: Fair ?Standing balance comment: able to maintain standing (with heavy walker reliance) much longer and more confidently than on eval ?  ?  ?  ?  ?  ?  ?  ?  ?  ?  ?  ?  ? ?  ?Cognition Arousal/Alertness: Awake/alert ?Behavior During Therapy: Anxious ?Overall Cognitive Status: Within Functional Limits for tasks assessed ?  ?  ?  ?  ?  ?  ?  ?  ?  ?  ?  ?  ?  ?  ?  ?  ?  ?  ?  ? ?  ?Exercises Total Joint Exercises ?Ankle Circles/Pumps: AROM, 10 reps (poor tolerance with any ROM on R) ?Quad Sets: Strengthening, 10 reps ?Short Arc Quad: Strengthening, 10 reps ?Hip ABduction/ADduction: Strengthening, AROM, 10 reps ?Straight Leg Raises: AROM, 10 reps (pt unable to lift R AROM yesterday, good effort and tolerance today) ? ?  ?General Comments   ?  ?  ? ?Pertinent Vitals/Pain Pain Assessment ?Pain Assessment: 0-10 ?Pain Score: 5   ? ? ?Home Living   ?  ?  ?  ?  ?  ?  ?  ?  ?  ?   ?  ?Prior Function    ?  ?  ?   ? ?PT Goals (current goals can now be found in the care plan section) Progress towards PT goals: Progressing toward goals ? ?  ?Frequency ? ? ? 7X/week ? ? ? ?  ?PT Plan Current plan remains appropriate  ? ? ?Co-evaluation   ?  ?  ?  ?  ? ?  ?AM-PAC PT "6 Clicks" Mobility   ?Outcome Measure ? Help needed turning from your back to your side while in a flat bed without using bedrails?: None ?Help needed moving from lying on your back to sitting on the side of a flat bed without using bedrails?: None ?Help needed moving to and from a bed to a chair (including a wheelchair)?: A Little ?Help needed standing up from a chair using your arms (e.g., wheelchair or bedside chair)?: A Little ?Help needed to walk  in hospital room?: A Little ?Help needed climbing 3-5 steps with a railing? : Total ?6 Click Score: 18 ? ?  ?End of Session Equipment Utilized During Treatment: Gait belt ?Activity Tolerance: Patient tolerated treatment well;No increased pain ?Patient left: with chair alarm set;with call bell/phone within reach ?Nurse Communication: Mobility status ?PT Visit Diagnosis: Unsteadiness on feet (R26.81);Other abnormalities of gait and mobility (R26.89);Muscle weakness (generalized) (M62.81);Pain ?Pain - Right/Left: Right ?Pain - part of body: Ankle and joints of foot ?  ? ? ?Time: [phone] ?PT Time Calculation (min) (ACUTE ONLY): 27 min ? ?Charges:  $Gait Training: 8-22 mins ?$Therapeutic Exercise: 8-22 mins          ?          ? ?  Kreg Shropshire, DPT ?12/23/2021, 10:04 AM ? ?

## 2021-12-23 NOTE — Clinical Social Work Note (Signed)
Occupational Therapy * Physical Therapy * Speech Therapy ?       ? ? ?DATE ___3/9/23________________ ?PATIENT NAME___[name]______ ?PATIENT MRN____[id number]________________ ? ?DIAGNOSIS/DIAGNOSIS CODE ____Infected cat bite of forearm_______ ?DATE OF DISCHARGE: __3/9/23____________ ? ?PRIMARY CARE PHYSICIAN ____Maura Hamrick______________________ ?PCP PHONE/FAX_[phone]__________________________ ? ?  ? ?Dear Provider (Name: __________________   ?Fax: ___________________________): ?  ?I certify that I have examined this patient and that occupational/physical/speech therapy is necessary on an outpatient basis.   ? ?The patient has expressed interest in completing their recommended course of therapy at your location.  Once a formal order from the patient's primary care physician has been obtained, please contact him/her to schedule an appointment for evaluation at your earliest convenience. ? ? ?[  X]  Physical Therapy Evaluate and Treat ? ?        [  X]  Occupational Therapy Evaluate and Treat ? ?                                  [  ]  Speech Therapy Evaluate and Treat ? ? ? ? ? ? ?The patient's primary care physician (listed above) must furnish and be responsible for a formal order such that the recommended services may be furnished while under the primary physician's care, and that the plan of care will be established and reviewed every 30 days (or more often if condition necessitates).  ? ?MD electronic signature noted below. ?

## 2021-12-23 NOTE — TOC Initial Note (Signed)
Transition of Care (TOC) - Initial/Assessment Note  ? ? ?Patient Details  ?Name: [name] ?MRN: [id number] ?Date of Birth: 1946/05/24 ? ?Transition of Care (TOC) CM/SW Contact:    ?Sims Laday A Akili Cuda, LCSW ?Phone Number: ?12/23/2021, 10:56 AM ? ?Clinical Narrative:    CSW spoke with pt at bedside. Pt states she lives with her Granddaughter. Pt states she does not want to go to SNF and would rather dc home. Pt also does not want HH instead pt wants outpatient HH. Pt would like to come to the location here at hospital. Pt states [name] works until 3:00 but could bring her to appointments after that time. CSW will send outpatient referral. Pt also states she has no DME needs.             ? ? ?Expected Discharge Plan: Home/Self Care ?Barriers to Discharge: Continued Medical Work up ? ? ?Patient Goals and CMS Choice ?Patient states their goals for this hospitalization and ongoing recovery are:: to return home ?  ?Choice offered to / list presented to : Patient ? ?Expected Discharge Plan and Services ?Expected Discharge Plan: Home/Self Care ?  ?Discharge Planning Services: NA ?Post Acute Care Choice: NA ?Living arrangements for the past 2 months: [place] ?                ?  ?  ?  ?  ?  ?  ?  ?  ?  ?  ? ?Prior Living Arrangements/Services ?Living arrangements for the past 2 months: [place] ?Lives with:: Other (Comment) (granddaughter) ?Patient language and need for interpreter reviewed:: Yes ?Do you feel safe going back to the place where you live?: Yes      ?Need for Family Participation in Patient Care: Yes (Comment) ?Care giver support system in place?: Yes (comment) ?  ?Criminal Activity/Legal Involvement Pertinent to Current Situation/Hospitalization: No - Comment as needed ? ?Activities of Daily Living ?Home Assistive Devices/Equipment: Gilford Rile (specify type) ?ADL Screening (condition at time of admission) ?Patient's cognitive ability adequate to safely complete daily activities?: Yes ?Is  the patient deaf or have difficulty hearing?: No ?Does the patient have difficulty seeing, even when wearing glasses/contacts?: No ?Does the patient have difficulty concentrating, remembering, or making decisions?: No ?Patient able to express need for assistance with ADLs?: Yes ?Does the patient have difficulty dressing or bathing?: No ?Independently performs ADLs?: No ?Does the patient have difficulty walking or climbing stairs?: Yes ?Weakness of Legs: Right ?Weakness of Arms/Hands: Right ? ?Permission Sought/Granted ?Permission sought to share information with : Family Supports ?Permission granted to share information with : Yes, Verbal Permission Granted ? Share Information with NAME: [name] ?   ? Permission granted to share info w Relationship: granddaughter ?   ? ?Emotional Assessment ?Appearance:: Appears stated age ?Attitude/Demeanor/Rapport: Engaged ?Affect (typically observed): Accepting ?Orientation: : Oriented to Self, Oriented to Place, Oriented to  Time, Oriented to Situation ?Alcohol / Substance Use: Not Applicable ?Psych Involvement: No (comment) ? ?Admission diagnosis:  Need for prophylactic vaccination against rabies [Z23] ?Cellulitis of right forearm [L03.113] ?Generalized weakness [R53.1] ?Multiple falls [R29.6] ?Cat bite, initial encounter [W55.01XA] ?Infected cat bite of forearm, right, initial encounter [S51.851A, L08.9, W55.01XA] ?Closed avulsion fracture of right ankle, initial encounter [S82.891A] ?Patient Active Problem List  ? Diagnosis Date Noted  ? History of V- fib arrest   ? Acute renal failure superimposed on stage 3a chronic kidney disease ([place])   ? Metabolic acidosis, normal anion gap (NAG)   ?  Closed fibular fracture 12/21/2021  ? Acute metabolic encephalopathy 04/27/1974  ? Need for post exposure prophylaxis for rabies 12/21/2021  ? Infected cat bite of forearm, right, initial encounter 12/21/2021  ? Vitamin D deficiency 12/06/2021  ? Osteoarthritis of acromioclavicular joints  (Bilateral) 12/06/2021  ? Osteoarthritis of glenohumeral joints (Bilateral) 12/06/2021  ? Osteoarthritis of shoulders (Bilateral) 12/06/2021  ? 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 Grade1 Anterolisthesis of lumbosacral spine (L5/S1) 12/06/2021  ? DDD (degenerative disc disease), lumbar 12/06/2021  ? Lumbar facet hypertrophy (L4-5, L5-S1) 12/06/2021  ? Osteopenia of lumbar spine 12/06/2021  ? Osteopenia determined by x-ray 12/06/2021  ? Osteoarthritis of hip (Left) 12/06/2021  ? Chronic pain syndrome 09/01/2021  ? Pharmacologic therapy 09/01/2021  ? Disorder of skeletal system 09/01/2021  ? Problems influencing health status 09/01/2021  ? Encounter for chronic pain management 09/01/2021  ? DDD (degenerative disc disease), cervical 09/01/2021  ? Cervical facet osteoarthritis (Bilateral) (R>L) 09/01/2021  ? Cervical facet arthropathy (Bilateral) (R>L) 09/01/2021  ? Cervical foraminal stenosis (Right) 09/01/2021  ? Other unilateral secondary osteoarthritis of knee (Right) 09/01/2021  ? 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 Osteoarthritis of knees (Bilateral) 09/01/2021  ? 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 Chronic low back pain (1ry area of Pain) (Bilateral) (R>L) w/o sciatica 09/01/2021  ? Chronic shoulder pain (6th area of Pain) (Bilateral) (R=L) 09/01/2021  ? Chronic upper back pain 09/01/2021  ? Chronic lower extremity pain (2ry area of Pain) (Bilateral) (R>L) 09/01/2021  ? Chronic hip pain (4th area of Pain) (Bilateral) (R>L) 09/01/2021  ? Chronic neck and back pain (5th area of Pain) (Bilateral) (R>L) 09/01/2021  ? Cervicalgia 09/01/2021  ? Cervicogenic headache (7th area of Pain) 09/01/2021  ? Occipital headache 09/01/2021  ? Vertigo 09/01/2021  ? Balance problems 09/01/2021  ? At high risk for falls 09/01/2021  ? Ear pain, bilateral 09/01/2021  ? Cervical facet syndrome 09/01/2021  ? Acute CHF (congestive heart failure) ([place]) 01/21/2021  ?  Macrocytic anemia 01/21/2021  ? Toe injury, left, sequela 01/21/2021  ? Acute respiratory failure with hypoxia ([place]) 01/21/2021  ? Acute non-recurrent pansinusitis 10/03/2016  ? Chronic shoulder pain (Right) 06/21/2016  ? Chronic knee pain (3ry area of Pain) (Bilateral) 06/21/2016  ? Scoliosis 02/04/2015  ? Ventricular fibrillation ([place]) 11/03/2014  ? Anxiety state, unspecified 05/15/2013  ? Mitral regurgitation 09/20/2012  ? Chronic systolic CHF (congestive heart failure) ([place]) 04/20/2010  ? Automatic implantable cardioverter-defibrillator in situ 04/20/2010  ? Hypothyroidism 12/28/2009  ? HYPERLIPIDEMIA 12/28/2009  ? Essential hypertension 12/28/2009  ? CARDIAC ARRHYTHMIA 12/28/2009  ? CHF 12/28/2009  ? IBS 12/28/2009  ? ?PCP:  Leonides Sake, MD ?Pharmacy:   ?[facility] - [place], [place] - [address], [address] ?[address], [address] ?[place] [address] ?Phone: [phone] Fax: [phone] ? ?[id number] - [place], [place] [address] ?[place] [address] ?Phone: [phone] Fax: [phone] ? ? ? ? ?Social Determinants of Health (SDOH) Interventions ?  ? ?Readmission Risk Interventions ?No flowsheet data found. ? ? ?

## 2021-12-23 NOTE — TOC Progression Note (Signed)
Transition of Care (TOC) - Progression Note  ? ? ?Patient Details  ?Name: [name] ?MRN: [id number] ?Date of Birth: [birth year] ? ?Transition of Care (TOC) CM/SW Contact  ?Zae Kirtz A [place] Kuang, LCSW ?Phone Number: ?12/23/2021, 11:07 AM ? ?Clinical Narrative: outpatient [facility] referral faxed to [facility] location.   ? ? ? ?Expected Discharge Plan: Home/Self Care ?Barriers to Discharge: Continued Medical Work up ? ?Expected Discharge Plan and Services ?Expected Discharge Plan: Home/Self Care ?  ?Discharge Planning Services: NA ?Post Acute Care Choice: NA ?Living arrangements for the past 2 months: [place] ?                ?  ?  ?  ?  ?  ?  ?  ?  ?  ?  ? ? ?Social Determinants of Health (SDOH) Interventions ?  ? ?Readmission Risk Interventions ?No flowsheet data found. ? ?

## 2021-12-23 NOTE — Progress Notes (Signed)
?Progress Note ? ? ?Patient: [name] IDP:[id number] DOB: 1945-11-26 DOA: 12/21/2021     1 ?DOS: the patient was seen and examined on 12/23/2021 ?  ?Brief hospital course: ?[name] is a 76 y.o. female with medical history significant for CKD 3a, B12 deficiency with macrocytic anemia and neuropathy, chronic systolic heart failure (EF 04/2019 to 30 to 35%) with history of V-fib arrest s/p AICD, on amiodarone for prevention of ventricular arrhythmias, HTN and hypothyroidism who presents to the ED following a fall in which she sustained a fibular fracture at the right ankle.  Patient states that that she did not blackout. ?After arriving the emergency room, patient received rabies immunoglobulin and vaccine #1.  He is also placed on Zosyn for cellulitis. ? ?Assessment and Plan: ?* Infected cat bite of forearm, right, initial encounter ?Condition is improved, will change antibiotic to oral. ? ?Acute metabolic encephalopathy ?Condition has improved. ? ?Closed fibular fracture ?Splinted in the ED ?Pain control ?Refer patient to outpatient orthopedics.  Discussed with [name], no surgery needed, okay for weightbearing. ? ?Metabolic acidosis, normal anion gap (NAG) ?This is secondary to acute kidney injury.  Condition had improved. ? ?Acute renal failure superimposed on stage 3a chronic kidney disease ([place]) ?Renal function is steadily getting worse, hold off Entresto and Aldactone.  Gentle rehydration.  Also obtain bladder scan to rule out urinary retention ? ?Need for post exposure prophylaxis for rabies ?Patient was bitten by a stray cat ?S/p rabies immunoglobulin and vaccine #1 of 4 in the ED. she will need additional 3 doses all day #3, 7 and they have 14-28. ?Patient had a tetanus shot a year ago, she does not need additional  Immunization. ?Next dose of rabies vaccine tomorrow. ? ?History of V- fib arrest ?S/p AICD ?Continue amiodarone. ?Discussed with cardiology, AICD interrogation did not show any evidence of  ventricular arrhythmia.   ?Patient also needsd replacement of battery for AICD, which has been arranged as outpatient with cardiology ? ?Chronic systolic CHF (congestive heart failure) ([place]) ?Currently compensated ?Hold off Entresto and Aldactone due to worsening renal function and mild hyperkalemia ? ?Hyponatremia ?We will start gentle rehydration today. ? ?Macrocytic anemia ?B12 level normal.  Also check iron level. ? ?Essential hypertension ?Continue carvedilol, discontinue diuretics due to worsening renal function. ? ?Hypothyroidism ?Continue levothyroxine ? ? ? ? ?  ? ?Subjective:  ?Patient doing much better, right arm redness essentially resolved. ?No abdominal pain nausea vomiting. ? ?Physical Exam: ?Vitals:  ? 12/22/21 1959 12/23/21 0421 12/23/21 0723 12/23/21 1125  ?BP: 118/69 110/62 (!) 119/59 (!) 105/45  ?Pulse: 74 60 65 61  ?Resp: 20 18 18 18   ?Temp: 98.1 ?F (36.7 ?C) 98.4 ?F (36.9 ?C) 98.1 ?F (36.7 ?C) 98.6 ?F (37 ?C)  ?TempSrc: Oral Oral    ?SpO2: 96% 93% 96% 93%  ?Weight:      ?Height:      ? ?General exam: Appears calm and comfortable  ?Respiratory system: Clear to auscultation. Respiratory effort normal. ?Cardiovascular system: S1 & S2 heard, RRR. No JVD, murmurs, rubs, gallops or clicks. No pedal edema. ?Gastrointestinal system: Abdomen is nondistended, soft and nontender. No organomegaly or masses felt. Normal bowel sounds heard. ?Central nervous system: Alert and oriented. No focal neurological deficits. ?Extremities: Symmetric 5 x 5 power. ?Skin: No rashes, lesions or ulcers ?Psychiatry: Judgement and insight appear normal. Mood & affect appropriate.  ? ?Data Reviewed: ?Reviewed all lab results ? ?Family Communication:  ? ?Disposition: ?Status is: Inpatient ?Remains inpatient appropriate because:  Severity of disease. ? Planned Discharge Destination: Home ? ? ? ?Time spent: 36 minutes ? ?Author: ?Sharen Hones, MD ?12/23/2021 11:42 AM ? ?For on call review [email or web address].  ?

## 2021-12-23 NOTE — Evaluation (Signed)
Occupational Therapy Evaluation Patient Details Name: [name] MRN: [id number] DOB: 20-Aug-1946 Today's Date: 12/23/2021   History of Present Illness Pt is a 76 y.o. female with medical history significant for CKD 3a, B12 deficiency with macrocytic anemia and neuropathy, chronic systolic heart failure (EF 04/2019 30-35%), V-fib arrest s/p AICD, HTN and hypothyroidism who presents to the ED following a fall in which she sustained a fibular fracture at the right ankle.  The fall occurred in the setting of altered mental status while being treated for an acute cellulitis of the right forearm that developed from cat bite.  MD assessment also includes metabolic acidosis, normal anion gap, acute renal failure, and macrocytic anemia.   Clinical Impression   Pt seen for OT evaluation this date in setting of acute hospitalization d/t infected cat bite to R forearm. She presents this date with c/o pain in R UE and pain in R ankle s/p fall at home after going to MD appt about said cat bite. She reports living with her granddaughter in [facility] with 4 STE and has rollator and 2WW but does not typically use either at baseline. She is still driving and getting her own groceries. She presents this date with decreased balance, activity tolerance and pain in  RUE/RLE impacting her ability to perform ADLs/ADL mobility safely and efficiently/at her baseline. She currently requires: SETUP to MIN A for seated UB ADLs d/t limited grip strength and wrist ROM on R UE, MIN A for seated LB ADLs, MIN/MOD A for transfers from lower surface like recliner with RW and posterior support. MIN A/CGA for fxl mobility to/from doorway w/in her room. Pt is assisted back to bed with MIN A end of session with all needs met and in reach, alarm set and CNA notified. Will continue to follow acutely. Recommend STR f/u services to improve strength and ADL INDEP/safety.      Recommendations for follow up therapy are one component of a multi-disciplinary  discharge planning process, led by the attending physician.  Recommendations may be updated based on patient status, additional functional criteria and insurance authorization.   Follow Up Recommendations  Skilled nursing-short term rehab (<3 hours/day)    Assistance Recommended at Discharge Intermittent Supervision/Assistance  Patient can return home with the following A little help with walking and/or transfers;A little help with bathing/dressing/bathroom;Assistance with cooking/housework;Assist for transportation;Help with stairs or ramp for entrance    Functional Status Assessment  Patient has had a recent decline in their functional status and demonstrates the ability to make significant improvements in function in a reasonable and predictable amount of time.  Equipment Recommendations  BSC/3in1    Recommendations for Other Services       Precautions / Restrictions Precautions Precautions: Fall Restrictions Weight Bearing Restrictions: Yes RLE Weight Bearing: Weight bearing as tolerated Other Position/Activity Restrictions: Walking boot in room.  Per ortho MD, boot is for comfort only, may WB without      Mobility Bed Mobility Overal bed mobility: Needs Assistance Bed Mobility: Sit to Supine       Sit to supine: Min assist   General bed mobility comments: MIN A to assist with elevating R LE back to bed    Transfers Overall transfer level: Needs assistance Equipment used: Rolling walker (2 wheels) Transfers: Sit to/from Stand Sit to Stand: Min assist, Mod assist           General transfer comment: MIN A from elevated surface, MIN/MOD A with RW from recliner seat. Limited ability to put  weight/tolerate pushing off R UE to CTS. Requires posterior support      Balance Overall balance assessment: Needs assistance Sitting-balance support: Bilateral upper extremity supported, Feet unsupported Sitting balance-Leahy Scale: Good     Standing balance support:  Bilateral upper extremity supported, During functional activity, Reliant on assistive device for balance Standing balance-Leahy Scale: Fair Standing balance comment: able to maintain standing (with heavy walker reliance) much longer and more confidently than on eval                           ADL either performed or assessed with clinical judgement   ADL Overall ADL's : Needs assistance/impaired                                       General ADL Comments: SETUP to MIN A for seated UB ADLs d/t limited grip strength and wrist ROM on R UE, MIN A for seated LB ADLs, MIN/MOD A for transfers from lower surface like recliner with RW and posterior support. MIN A/CGA for fxl mobility to/from doorway w/in her room.     Vision Patient Visual Report: No change from baseline       Perception     Praxis      Pertinent Vitals/Pain Pain Assessment Pain Assessment: 0-10 Pain Score: 6  Pain Location: R ankle and R forearm Pain Descriptors / Indicators: Aching, Guarding, Grimacing Pain Intervention(s): Limited activity within patient's tolerance, Monitored during session, Repositioned     Hand Dominance Right   Extremity/Trunk Assessment Upper Extremity Assessment Upper Extremity Assessment: Generalized weakness (wrist ROM limited on R side ~1/2 range ulnar/radial deviation and flexion/extension. otherwsie ROM WFL. Grip MMT R hand 3+/5 and L hand 4-/5.)   Lower Extremity Assessment Lower Extremity Assessment: Generalized weakness   Cervical / Trunk Assessment Cervical / Trunk Assessment: Other exceptions Cervical / Trunk Exceptions: scoliosis   Communication Communication Communication: No difficulties   Cognition Arousal/Alertness: Awake/alert Behavior During Therapy: Anxious Overall Cognitive Status: Within Functional Limits for tasks assessed                                       General Comments       Exercises Other Exercises Other  Exercises: OT ed re: role (difference between PT/OT), importance of OOB to maintain mobility, reduce risk of atrophy and reduce risk of other infections or skin breakdown   Shoulder Instructions      Home Living Family/patient expects to be discharged to:: Private residence Living Arrangements: Other relatives Available Help at Discharge: Family;Available PRN/intermittently Type of Home: Mobile home Home Access: Stairs to enter Entrance Stairs-Number of Steps: 4 Entrance Stairs-Rails: Can reach both;Right;Left Home Layout: One level     Bathroom Shower/Tub: Teacher, early years/pre: Handicapped height     Home Equipment: Conservation officer, nature (2 wheels);Rollator (4 wheels);BSC/3in1;Shower seat          Prior Functioning/Environment Prior Level of Function : Independent/Modified Independent;Driving             Mobility Comments: Ind amb mostly without an AD with PRN rollator use, community ambulator, no other fall history ADLs Comments: Ind with ADLs and IADLs, gets her own groceries. Uses shower seat        OT Problem List: Decreased strength;Decreased  range of motion;Decreased activity tolerance;Impaired balance (sitting and/or standing);Pain;Decreased knowledge of use of DME or AE      OT Treatment/Interventions: Self-care/ADL training;Therapeutic exercise;Therapeutic activities;DME and/or AE instruction    OT Goals(Current goals can be found in the care plan section) Acute Rehab OT Goals Patient Stated Goal: to go home OT Goal Formulation: With patient Time For Goal Achievement: 01/06/22 Potential to Achieve Goals: Good  OT Frequency: Min 2X/week    Co-evaluation              AM-PAC OT "6 Clicks" Daily Activity     Outcome Measure Help from another person eating meals?: None Help from another person taking care of personal grooming?: A Little Help from another person toileting, which includes using toliet, bedpan, or urinal?: A Little Help from  another person bathing (including washing, rinsing, drying)?: A Lot Help from another person to put on and taking off regular upper body clothing?: A Little Help from another person to put on and taking off regular lower body clothing?: A Lot 6 Click Score: 17   End of Session Equipment Utilized During Treatment: Gait belt;Rolling walker (2 wheels) Nurse Communication: Mobility status  Activity Tolerance: Patient tolerated treatment well Patient left: in bed;with call bell/phone within reach;with bed alarm set  OT Visit Diagnosis: Unsteadiness on feet (R26.81);Muscle weakness (generalized) (M62.81);Pain Pain - Right/Left: Right Pain - part of body: Ankle and joints of foot                Time: 0370-9643 OT Time Calculation (min): 27 min Charges:  OT General Charges $OT Visit: 1 Visit OT Evaluation $OT Eval Moderate Complexity: 1 Mod OT Treatments $Therapeutic Activity: 8-22 mins  Gerrianne Scale, MS, OTR/L ascom [phone] 12/23/21, 10:37 AM

## 2021-12-24 DIAGNOSIS — D509 Iron deficiency anemia, unspecified: Secondary | ICD-10-CM

## 2021-12-24 LAB — CBC WITH DIFFERENTIAL/PLATELET
Abs Immature Granulocytes: 0.04 10*3/uL (ref 0.00–0.07)
Basophils Absolute: 0 10*3/uL (ref 0.0–0.1)
Basophils Relative: 0 %
Eosinophils Absolute: 0.2 10*3/uL (ref 0.0–0.5)
Eosinophils Relative: 4 %
HCT: 24.6 % — ABNORMAL LOW (ref 36.0–46.0)
Hemoglobin: 7.9 g/dL — ABNORMAL LOW (ref 12.0–15.0)
Immature Granulocytes: 1 %
Lymphocytes Relative: 10 %
Lymphs Abs: 0.5 10*3/uL — ABNORMAL LOW (ref 0.7–4.0)
MCH: 33.3 pg (ref 26.0–34.0)
MCHC: 32.1 g/dL (ref 30.0–36.0)
MCV: 103.8 fL — ABNORMAL HIGH (ref 80.0–100.0)
Monocytes Absolute: 0.5 10*3/uL (ref 0.1–1.0)
Monocytes Relative: 10 %
Neutro Abs: 4 10*3/uL (ref 1.7–7.7)
Neutrophils Relative %: 75 %
Platelets: 185 10*3/uL (ref 150–400)
RBC: 2.37 MIL/uL — ABNORMAL LOW (ref 3.87–5.11)
RDW: 13.1 % (ref 11.5–15.5)
WBC: 5.3 10*3/uL (ref 4.0–10.5)
nRBC: 0 % (ref 0.0–0.2)

## 2021-12-24 LAB — BASIC METABOLIC PANEL
Anion gap: 6 (ref 5–15)
BUN: 34 mg/dL — ABNORMAL HIGH (ref 8–23)
CO2: 20 mmol/L — ABNORMAL LOW (ref 22–32)
Calcium: 8.6 mg/dL — ABNORMAL LOW (ref 8.9–10.3)
Chloride: 106 mmol/L (ref 98–111)
Creatinine, Ser: 1.66 mg/dL — ABNORMAL HIGH (ref 0.44–1.00)
GFR, Estimated: 32 mL/min — ABNORMAL LOW (ref >60)
Glucose, Bld: 107 mg/dL — ABNORMAL HIGH (ref 70–99)
Potassium: 5 mmol/L (ref 3.5–5.1)
Sodium: 132 mmol/L — ABNORMAL LOW (ref 135–145)

## 2021-12-24 LAB — MAGNESIUM: Magnesium: 2.2 mg/dL (ref 1.7–2.4)

## 2021-12-24 MED ORDER — SODIUM CHLORIDE 0.9 % IV SOLN
125.0000 mg | Freq: Once | INTRAVENOUS | Status: DC
  Filled 2021-12-24: qty 10

## 2021-12-24 MED ORDER — LACTULOSE 10 GM/15ML PO SOLN
20.0000 g | Freq: Once | ORAL | Status: DC
  Filled 2021-12-24: qty 30

## 2021-12-24 NOTE — Plan of Care (Signed)

## 2021-12-24 NOTE — Progress Notes (Signed)
PT Cancellation Note ? ?Patient Details ?Name: [name] ?MRN: [id number] ?DOB: 23-Jan-1946 ? ? ?Cancelled Treatment:    PT attempt. Pt was long sitting in bed upon arriving. She is A and O however c/o severe nausea. " I can't do PT today. They gave me a bunch of laxatives and I don't feel well." Author discussed returning later in the day when she felt better. Gav pt wet washcloth as requested.  Did discuss DC disposition and current recommendation for SNF. " I'm not going to rehab. I'm going home with outpatient PT." Author will return later in the day to progress pt with OOB activity.  ? ? ?Willette Pa ?12/24/2021, 9:19 AM ?

## 2021-12-24 NOTE — Care Management Important Message (Signed)
Important Message ? ?Patient Details  ?Name: [name] ?MRN: [id number] ?Date of Birth: [birth year] ? ? ?Medicare Important Message Given:  Yes ? ? ? ? ?Juliann Pulse A Havah Ammon ?12/24/2021, 11:57 AM ?

## 2021-12-24 NOTE — Plan of Care (Signed)
?  Problem: Clinical Measurements: ?Goal: Ability to avoid or minimize complications of infection will improve ?Outcome: Progressing ?  ?Problem: Skin Integrity: ?Goal: Skin integrity will improve ?Outcome: Progressing ?  ?Problem: Education: ?Goal: Knowledge of General Education information will improve ?Description: Including pain rating scale, medication(s)/side effects and non-pharmacologic comfort measures ?Outcome: Progressing ?  ?Problem: Health Behavior/Discharge Planning: ?Goal: Ability to manage health-related needs will improve ?Outcome: Progressing ?  ?Problem: Clinical Measurements: ?Goal: Ability to maintain clinical measurements within normal limits will improve ?Outcome: Progressing ?Goal: Will remain free from infection ?Outcome: Progressing ?Goal: Diagnostic test results will improve ?Outcome: Progressing ?Goal: Respiratory complications will improve ?Outcome: Progressing ?Goal: Cardiovascular complication will be avoided ?Outcome: Progressing ?  ?Problem: Activity: ?Goal: Risk for activity intolerance will decrease ?Outcome: Progressing ?  ?Problem: Nutrition: ?Goal: Adequate nutrition will be maintained ?Outcome: Progressing ?  ?Problem: Coping: ?Goal: Level of anxiety will decrease ?Outcome: Progressing ?  ?Problem: Elimination: ?Goal: Will not experience complications related to bowel motility ?Outcome: Progressing ?  ?Problem: Pain Managment: ?Goal: General experience of comfort will improve ?Outcome: Progressing ?  ?Problem: Safety: ?Goal: Ability to remain free from injury will improve ?Outcome: Progressing ?  ?Problem: Skin Integrity: ?Goal: Risk for impaired skin integrity will decrease ?Outcome: Progressing ?  ?Problem: Elimination: ?Goal: Will not experience complications related to urinary retention ?Outcome: Completed/Met ?  ?

## 2021-12-24 NOTE — Progress Notes (Signed)
?Progress Note ? ? ?Patient: [name] EYC:[id number] DOB: 1945/12/20 DOA: 12/21/2021     2 ?DOS: the patient was seen and examined on 12/24/2021 ?  ?Brief hospital course: ?[name] is a 76 y.o. female with medical history significant for CKD 3a, B12 deficiency with macrocytic anemia and neuropathy, chronic systolic heart failure (EF 04/2019 to 30 to 35%) with history of V-fib arrest s/p AICD, on amiodarone for prevention of ventricular arrhythmias, HTN and hypothyroidism who presents to the ED following a fall in which she sustained a fibular fracture at the right ankle.  Patient states that that she did not blackout. ?After arriving the emergency room, patient received rabies immunoglobulin and vaccine #1.  He is also placed on Zosyn for cellulitis. ? ?Assessment and Plan: ?* Infected cat bite of forearm, right, initial encounter ?Condition is improved, will complete 7 days of antibiotics. ? ?Acute metabolic encephalopathy ?Condition has improved. ? ?Closed fibular fracture ?Splinted in the ED ?Pain control ?Refer patient to outpatient orthopedics.  Discussed with Dr. Sharlet Salina, no surgery needed, okay for weightbearing. ? ?Metabolic acidosis, normal anion gap (NAG) ?This is secondary to acute kidney injury.  Condition had improved. ? ?Acute renal failure superimposed on stage 3a chronic kidney disease ([place]) ?Renal function is better after giving fluids. ? ?Need for post exposure prophylaxis for rabies ?Patient was bitten by a stray cat ?S/p rabies immunoglobulin and vaccine #1 of 4 in the ED. patient received the second dose today.  3d dose 10/14 and 4th dose between 10/21 to 10/28.   ?Patient had a tetanus shot a year ago, she does not need additional  Immunization. ? ? ?History of V- fib arrest ?S/p AICD ?Continue amiodarone. ?Discussed with cardiology, AICD interrogation did not show any evidence of ventricular arrhythmia.   ?Patient also needsd replacement of battery for AICD, which has been arranged as  outpatient with cardiology ? ?Chronic systolic CHF (congestive heart failure) ([place]) ?Currently compensated ?Hold off Entresto and Aldactone due to worsening renal function and mild hyperkalemia ? ?Iron deficiency anemia ?Iron studies showed significant iron deficient anemia, given lower hemoglobin, patient will be given a dose of IV iron.  Continue to follow. ? ?Hyponatremia ?Sodium level stable, received gentle rehydration, will discontinue. ? ?Macrocytic anemia ?Continue home dose of B12.  B12 level within normal range. ? ?Essential hypertension ?Continue carvedilol, discontinue diuretics due to worsening renal function. ? ?Hypothyroidism ?Continue levothyroxine ? ? ? ? ?  ? ?Subjective:  ?Patient doing better, but still has signal weakness.  Currently pending for nursing placement. ?No short of breath or cough. ? ?Physical Exam: ?Vitals:  ? 12/23/21 1946 12/23/21 2001 12/24/21 0447 12/24/21 0737  ?BP: (!) 81/49 (!) 92/40 116/61 (!) 129/46  ?Pulse: 61 63 60 (!) 58  ?Resp: 17  17 15   ?Temp: 99.2 ?F (37.3 ?C)  98.3 ?F (36.8 ?C) 98.2 ?F (36.8 ?C)  ?TempSrc:      ?SpO2: 94% 91% 95% 98%  ?Weight:      ?Height:      ? ?General exam: Appears calm and comfortable  ?Respiratory system: Clear to auscultation. Respiratory effort normal. ?Cardiovascular system: S1 & S2 heard, RRR. No JVD, murmurs, rubs, gallops or clicks. No pedal edema. ?Gastrointestinal system: Abdomen is nondistended, soft and nontender. No organomegaly or masses felt. Normal bowel sounds heard. ?Central nervous system: Alert and oriented. No focal neurological deficits. ?Extremities: Symmetric 5 x 5 power. ?Skin: No rashes, lesions or ulcers ?Psychiatry: Judgement and insight appear normal. Mood & affect appropriate.  ? ?  Data Reviewed: ?All labs reviewed ? ?Family Communication:  ? ?Disposition: ?Status is: Inpatient ?Remains inpatient appropriate because: Unsafe discharge, pending nursing placement ? Planned Discharge Destination: Skilled nursing  facility ? ? ? ?Time spent: 28 minutes ? ?Author: ?Sharen Hones, MD ?12/24/2021 10:59 AM ? ?For on call review [email or web address].  ?

## 2021-12-24 NOTE — Progress Notes (Signed)
Physical Therapy Treatment ?Patient Details ?Name: [name] ?MRN: [id number] ?DOB: [birth year] ?Today's Date: 12/24/2021 ? ? ?History of Present Illness Pt is a 76 y.o. female with medical history significant for CKD 3a, B12 deficiency with macrocytic anemia and neuropathy, chronic systolic heart failure (EF 04/2019 30-35%), V-fib arrest s/p AICD, HTN and hypothyroidism who presents to the ED following a fall in which she sustained a fibular fracture at the right ankle.  The fall occurred in the setting of altered mental status while being treated for an acute cellulitis of the right forearm that developed from cat bite.  MD assessment also includes metabolic acidosis, normal anion gap, acute renal failure, and macrocytic anemia. ? ?  ?PT Comments  ? ? Pt is resting in bed upon PT entrance into room, wakes to voice and reports feeling nausea, stating "I don't feel good enough to work with PT". But after some convincing because it was important to get her up to get out of the hospital, she was willing. She does not report any c/o RLE pain at rest, but visible shows grimacing and groaning throughout session. Pt was able to perform bed mobility w/ SUPERVISION to sit EOB. Once seated EOB, she was able to perform sit to stand w/ CGA using RW and was able to progress to ambulating ~50ft w/ CGA using RW. Pt returned to recliner w/ all needs within reach prior to PT exiting after end of session. Pt will benefit from continued skilled PT in order to increase LE strength, improve mobility/gait, and restore PLOF. Current discharge recommendation remains appropriate due to the level of assistance required by the patient to ensure safety and improve overall function. ? ?  ?Recommendations for follow up therapy are one component of a multi-disciplinary discharge planning process, led by the attending physician.  Recommendations may be updated based on patient status, additional functional criteria and insurance  authorization. ? ?Follow Up Recommendations ? Skilled nursing-short term rehab (<3 hours/day) ?  ?  ?Assistance Recommended at Discharge Frequent or constant Supervision/Assistance  ?Patient can return home with the following A little help with walking and/or transfers;A little help with bathing/dressing/bathroom;Assistance with cooking/housework;Assist for transportation;Help with stairs or ramp for entrance ?  ?Equipment Recommendations ? Rolling walker (2 wheels)  ?  ?Recommendations for Other Services   ? ? ?  ?Precautions / Restrictions Precautions ?Precautions: Fall ?Restrictions ?Weight Bearing Restrictions: Yes ?RLE Weight Bearing: Weight bearing as tolerated ?Other Position/Activity Restrictions: Walking boot in room.  Per ortho MD, boot is for comfort only, may WB without  ?  ? ?Mobility ? Bed Mobility ?Overal bed mobility: Needs Assistance ?Bed Mobility: Sit to Supine ?  ?  ?Supine to sit: Supervision ?  ?  ?  ?  ? ?Transfers ?Overall transfer level: Needs assistance ?Equipment used: Rolling walker (2 wheels) ?Transfers: Sit to/from Stand ?Sit to Stand: Min guard ?  ?  ?  ?  ?  ?  ?  ? ?Ambulation/Gait ?Ambulation/Gait assistance: Min guard ?Gait Distance (Feet): 40 Feet ?Assistive device: Rolling walker (2 wheels) ?Gait Pattern/deviations: Step-to pattern, Antalgic, Trunk flexed, Decreased stance time - right, Decreased step length - right, Decreased step length - left ?Gait velocity: decreased ?  ?  ?  ? ? ?Stairs ?  ?  ?  ?  ?  ? ? ?Wheelchair Mobility ?  ? ?Modified Rankin (Stroke Patients Only) ?  ? ? ?  ?Balance Overall balance assessment: Needs assistance ?Sitting-balance support: Bilateral upper extremity supported, Feet unsupported ?Sitting  balance-Leahy Scale: Good ?  ?  ?Standing balance support: Bilateral upper extremity supported, During functional activity, Reliant on assistive device for balance ?Standing balance-Leahy Scale: Fair ?  ?  ?  ?  ?  ?  ?  ?  ?  ?  ?  ?  ?  ? ?  ?Cognition  Arousal/Alertness: Awake/alert ?Behavior During Therapy: Anxious ?Overall Cognitive Status: Within Functional Limits for tasks assessed ?  ?  ?  ?  ?  ?  ?  ?  ?  ?  ?  ?  ?  ?  ?  ?  ?  ?  ?  ? ?  ?Exercises   ? ?  ?General Comments   ?  ?  ? ?Pertinent Vitals/Pain Pain Assessment ?Pain Assessment: Faces ?Faces Pain Scale: Hurts even more ?Pain Location: R ankle and R forearm ?Pain Descriptors / Indicators: Aching, Guarding, Grimacing ?Pain Intervention(s): Limited activity within patient's tolerance, Monitored during session, Repositioned  ? ? ?Home Living   ?  ?  ?  ?  ?  ?  ?  ?  ?  ?   ?  ?Prior Function    ?  ?  ?   ? ?PT Goals (current goals can now be found in the care plan section) Progress towards PT goals: Progressing toward goals ? ?  ?Frequency ? ? ? 7X/week ? ? ? ?  ?PT Plan Current plan remains appropriate  ? ? ?Co-evaluation   ?  ?  ?  ?  ? ?  ?AM-PAC PT "6 Clicks" Mobility   ?Outcome Measure ? Help needed turning from your back to your side while in a flat bed without using bedrails?: None ?Help needed moving from lying on your back to sitting on the side of a flat bed without using bedrails?: None ?Help needed moving to and from a bed to a chair (including a wheelchair)?: A Little ?Help needed standing up from a chair using your arms (e.g., wheelchair or bedside chair)?: A Little ?Help needed to walk in hospital room?: A Little ?Help needed climbing 3-5 steps with a railing? : Total ?6 Click Score: 18 ? ?  ?End of Session Equipment Utilized During Treatment: Gait belt ?Activity Tolerance: Patient tolerated treatment well;No increased pain ?Patient left: with chair alarm set;with call bell/phone within reach;in chair ?Nurse Communication: Mobility status ?PT Visit Diagnosis: Unsteadiness on feet (R26.81);Other abnormalities of gait and mobility (R26.89);Muscle weakness (generalized) (M62.81);Pain ?Pain - Right/Left: Right ?Pain - part of body: Ankle and joints of foot ?  ? ? ?Time: 6381-7711 ?PT  Time Calculation (min) (ACUTE ONLY): 20 min ? ?Charges:             ?          ? ? ?Jonnie Kind, SPT ?12/24/2021, 3:53 PM ? ?

## 2021-12-24 NOTE — Assessment & Plan Note (Addendum)
Iron studies showed significant iron deficient anemia, continue oral iron. ?

## 2021-12-25 DIAGNOSIS — E875 Hyperkalemia: Secondary | ICD-10-CM

## 2021-12-25 DIAGNOSIS — L03113 Cellulitis of right upper limb: Secondary | ICD-10-CM | POA: Diagnosis not present

## 2021-12-25 LAB — CBC WITH DIFFERENTIAL/PLATELET
Abs Immature Granulocytes: 0.03 10*3/uL (ref 0.00–0.07)
Basophils Absolute: 0 10*3/uL (ref 0.0–0.1)
Basophils Relative: 1 %
Eosinophils Absolute: 0.2 10*3/uL (ref 0.0–0.5)
Eosinophils Relative: 4 %
HCT: 25.5 % — ABNORMAL LOW (ref 36.0–46.0)
Hemoglobin: 8.2 g/dL — ABNORMAL LOW (ref 12.0–15.0)
Immature Granulocytes: 1 %
Lymphocytes Relative: 13 %
Lymphs Abs: 0.5 10*3/uL — ABNORMAL LOW (ref 0.7–4.0)
MCH: 32.9 pg (ref 26.0–34.0)
MCHC: 32.2 g/dL (ref 30.0–36.0)
MCV: 102.4 fL — ABNORMAL HIGH (ref 80.0–100.0)
Monocytes Absolute: 0.3 10*3/uL (ref 0.1–1.0)
Monocytes Relative: 8 %
Neutro Abs: 2.9 10*3/uL (ref 1.7–7.7)
Neutrophils Relative %: 73 %
Platelets: 223 10*3/uL (ref 150–400)
RBC: 2.49 MIL/uL — ABNORMAL LOW (ref 3.87–5.11)
RDW: 12.7 % (ref 11.5–15.5)
WBC: 4 10*3/uL (ref 4.0–10.5)
nRBC: 0 % (ref 0.0–0.2)

## 2021-12-25 LAB — BASIC METABOLIC PANEL
Anion gap: 7 (ref 5–15)
BUN: 24 mg/dL — ABNORMAL HIGH (ref 8–23)
CO2: 21 mmol/L — ABNORMAL LOW (ref 22–32)
Calcium: 9 mg/dL (ref 8.9–10.3)
Chloride: 106 mmol/L (ref 98–111)
Creatinine, Ser: 1.16 mg/dL — ABNORMAL HIGH (ref 0.44–1.00)
GFR, Estimated: 49 mL/min — ABNORMAL LOW (ref >60)
Glucose, Bld: 104 mg/dL — ABNORMAL HIGH (ref 70–99)
Potassium: 5 mmol/L (ref 3.5–5.1)
Sodium: 134 mmol/L — ABNORMAL LOW (ref 135–145)

## 2021-12-25 MED ORDER — ENOXAPARIN SODIUM 40 MG/0.4ML IJ SOSY
40.0000 mg | PREFILLED_SYRINGE | INTRAMUSCULAR | Status: DC
  Administered 2021-12-25: 40 mg via SUBCUTANEOUS
  Filled 2021-12-25: qty 0.4

## 2021-12-25 MED ORDER — ONDANSETRON HCL 4 MG PO TABS: 4.0000 mg | ORAL_TABLET | Freq: Every day | ORAL | 1 refills | Status: DC | PRN

## 2021-12-25 MED ORDER — HYDROCODONE-ACETAMINOPHEN 5-325 MG PO TABS: 1.0000 | ORAL_TABLET | Freq: Four times a day (QID) | ORAL | 0 refills | Status: DC | PRN

## 2021-12-25 MED ORDER — AMOXICILLIN-POT CLAVULANATE 875-125 MG PO TABS
1.0000 | ORAL_TABLET | Freq: Two times a day (BID) | ORAL | Status: DC
  Administered 2021-12-25: 1 via ORAL
  Filled 2021-12-25: qty 1

## 2021-12-25 MED ORDER — FERROUS SULFATE 325 (65 FE) MG PO TABS: 325.0000 mg | ORAL_TABLET | Freq: Every day | ORAL | 0 refills | Status: DC

## 2021-12-25 MED ORDER — AMOXICILLIN-POT CLAVULANATE 875-125 MG PO TABS: 1.0000 | ORAL_TABLET | Freq: Two times a day (BID) | ORAL | 0 refills | Status: AC

## 2021-12-25 MED ORDER — RABIES VACCINE, PCEC IM SUSR
1.0000 mL | Freq: Once | INTRAMUSCULAR | Status: AC
  Administered 2021-12-25: 1 mL via INTRAMUSCULAR
  Filled 2021-12-25: qty 1

## 2021-12-25 MED ORDER — SENNOSIDES-DOCUSATE SODIUM 8.6-50 MG PO TABS: 2.0000 | ORAL_TABLET | Freq: Two times a day (BID) | ORAL | 0 refills | Status: DC

## 2021-12-25 NOTE — Discharge Summary (Addendum)
Physician Discharge Summary   Patient: [name] MRN: [id number] DOB: 01-05-1946  Admit date:     12/21/2021  Discharge date: 12/25/21  Discharge Physician: Sharen Hones   PCP: Leonides Sake, MD   Recommendations at discharge:   Follow-up with PCP in 1 week. Come to the emergency room for third and fourth dose of rabies vaccination.  Days has been given to patient granddaughter. Cardiology will call you for AICD battery change. Follow-up with orthopedics in 2 weeks  Discharge Diagnoses: Principal Problem:   Infected cat bite of forearm, right, initial encounter Active Problems:   Acute metabolic encephalopathy   Closed fibular fracture   Need for post exposure prophylaxis for rabies   Acute renal failure superimposed on stage 3a chronic kidney disease (HCC)   Metabolic acidosis, normal anion gap (NAG)   Chronic systolic CHF (congestive heart failure) (HCC)   History of V- fib arrest   Hypothyroidism   Essential hypertension   Macrocytic anemia   Hyponatremia   Iron deficiency anemia   Hyperkalemia  Resolved Problems:   * No resolved hospital problems. *  Hospital Course: [name] is a 76 y.o. female with medical history significant for CKD 3a, B12 deficiency with macrocytic anemia and neuropathy, chronic systolic heart failure (EF 04/2019 to 30 to 35%) with history of V-fib arrest s/p AICD, on amiodarone for prevention of ventricular arrhythmias, HTN and hypothyroidism who presents to the ED following a fall in which she sustained a fibular fracture at the right ankle.  Patient states that that she did not blackout. After arriving the emergency room, patient received rabies immunoglobulin and vaccine #1.  He is also placed on Zosyn for cellulitis.  Assessment and Plan: * Infected cat bite of forearm, right, initial encounter Condition is improved, will complete 7 days of antibiotics.  Acute metabolic encephalopathy Condition has improved.  Closed fibular  fracture Splinted in the ED Pain control Refer patient to outpatient orthopedics.  Discussed with Dr. Sharlet Salina, no surgery needed, okay for weightbearing.  Metabolic acidosis, normal anion gap (NAG) This is secondary to acute kidney injury.  Condition had improved.  Acute renal failure superimposed on stage 3a chronic kidney disease ([place]) Renal function is better after giving fluids.  Need for post exposure prophylaxis for rabies Patient was bitten by a stray cat S/p rabies immunoglobulin and vaccine #1 of 4 in the ED. patient received the second dose 3/11.  3d dose 3/14 and 4th dose between 3/21 to 3/28.   Patient had a tetanus shot a year ago, she does not need additional  Immunization.   History of V- fib arrest S/p AICD Continue amiodarone. Discussed with cardiology, AICD interrogation did not show any evidence of ventricular arrhythmia.   Patient also needsd replacement of battery for AICD, which has been arranged as outpatient with cardiology  Chronic systolic CHF (congestive heart failure) ([place]) Currently compensated Hold off  Aldactone due to  mild hyperkalemia. Resume Entresto, renal function has improved.  Hyperkalemia Patient had a mild hyperkalemia associated with acute renal failure.  Potassium has normalized.  We will continue to hold off Aldactone at discharge.  Iron deficiency anemia Iron studies showed significant iron deficient anemia, continue oral iron.  Hyponatremia Received gentle rehydration, sodium level better.  Macrocytic anemia Continue home dose of B12.  B12 level within normal range.  Essential hypertension Continue some home medicines.  Hypothyroidism Continue levothyroxine   Patient has been evaluated by PT, recommended nursing home placement.  But the patient adamantly  refused nursing home.  As a result, she will be discharged home with home care, PT/OT.      Consultants: None Procedures performed: None  Disposition: Home  health Diet recommendation:  Discharge Diet Orders (From admission, onward)     Start     Ordered   12/25/21 0000  Diet - low sodium heart healthy        12/25/21 1035           Cardiac diet DISCHARGE MEDICATION: Allergies as of 12/25/2021       Reactions   Morphine And Related Itching, Nausea And Vomiting   Codeine Nausea And Vomiting   Demerol [meperidine Hcl] Nausea And Vomiting   Meperidine Hcl Nausea And Vomiting   Pollen Extract Itching, Other (See Comments)   Seasonal allergies        Medication List     STOP taking these medications    spironolactone 25 MG tablet Commonly known as: ALDACTONE       TAKE these medications    acetaminophen 500 MG tablet Commonly known as: TYLENOL Take 1,000 mg by mouth every 6 (six) hours as needed (back/neck pain). For pain   amiodarone 200 MG tablet Commonly known as: PACERONE Take amiodarone 200 mg one tablet daily Monday through Saturday.  Do not take amiodarone on Sunday. What changed:  how much to take how to take this when to take this additional instructions   amoxicillin-clavulanate 875-125 MG tablet Commonly known as: AUGMENTIN Take 1 tablet by mouth every 12 (twelve) hours for 4 days.   aspirin EC 81 MG tablet Take 81 mg by mouth every other day. Swallow whole.   aspirin-acetaminophen-caffeine 250-250-65 MG tablet Commonly known as: EXCEDRIN MIGRAINE Take 1 tablet by mouth every 6 (six) hours as needed for headache.   BC HEADACHE POWDER PO Take 1 Package by mouth 2 (two) times daily as needed (headache/pain).   carvedilol 3.125 MG tablet Commonly known as: COREG Take 1 tablet (3.125 mg total) by mouth 2 (two) times daily with a meal.   cyanocobalamin 1000 MCG tablet Take 1 tablet (1,000 mcg total) by mouth daily.   Entresto 97-103 MG Generic drug: sacubitril-valsartan Take 1 tablet by mouth 2 (two) times daily.   ergocalciferol 1.25 MG (50000 UT) capsule Commonly known as: VITAMIN D2 Take  1 capsule (50,000 Units total) by mouth 2 (two) times a week. X 6 weeks.   ferrous sulfate 325 (65 FE) MG tablet Take 1 tablet (325 mg total) by mouth daily with breakfast. Start taking on: December 26, 2021   gabapentin 100 MG capsule Commonly known as: NEURONTIN Take 1 capsule (100 mg total) by mouth at bedtime. What changed: how much to take   HYDROcodone-acetaminophen 5-325 MG tablet Commonly known as: NORCO/VICODIN Take 1 tablet by mouth every 6 (six) hours as needed for moderate pain.   levothyroxine 100 MCG tablet Commonly known as: SYNTHROID Take 100 mcg by mouth daily before breakfast.   multivitamin with minerals Tabs tablet Take 1 tablet by mouth daily.   MURINE TEARS FOR DRY EYES OP Place 2 drops into both eyes 2 (two) times daily as needed. For dry eyes   PARoxetine 30 MG tablet Commonly known as: PAXIL Take 60 mg by mouth every evening.   pravastatin 40 MG tablet Commonly known as: PRAVACHOL TAKE 1 TABLET BY MOUTH EVERY EVENING.   senna-docusate 8.6-50 MG tablet Commonly known as: Senokot-S Take 2 tablets by mouth 2 (two) times daily.   sodium chloride 0.65 %  Soln nasal spray Commonly known as: OCEAN Place 1 spray into both nostrils as needed (dry nasal passages).   Visine Dry Eye 0.2-0.2-1 % Soln Generic drug: Glycerin-Hypromellose-PEG 400 Place 1-2 drops into both eyes 3 (three) times daily as needed (dry/irritated eyes).   Vitamin D3 125 MCG (5000 UT) Caps Take 1 capsule (5,000 Units total) by mouth daily with breakfast. Take along with calcium and magnesium.        Follow-up Information     Hamrick, Maura L, MD Follow up in 1 week(s).   Specialty: Family Medicine Contact information: [place] [address] [phone]         Renee Harder, MD Follow up in 2 week(s).   Specialty: Orthopedic Surgery Contact information: [place] [address] [phone]                Discharge  Exam: Danley Danker Weights   12/21/21 1856  Weight: 72.6 kg   General exam: Appears calm and comfortable  Respiratory system: Clear to auscultation. Respiratory effort normal. Cardiovascular system: S1 & S2 heard, RRR. No JVD, murmurs, rubs, gallops or clicks. No pedal edema. Gastrointestinal system: Abdomen is nondistended, soft and nontender. No organomegaly or masses felt. Normal bowel sounds heard. Central nervous system: Alert and oriented. No focal neurological deficits. Extremities: Symmetric 5 x 5 power. Skin: No rashes, lesions or ulcers Psychiatry: Judgement and insight appear normal. Mood & affect appropriate.    Condition at discharge: good  The results of significant diagnostics from this hospitalization (including imaging, microbiology, ancillary and laboratory) are listed below for reference.   Imaging Studies: DG Chest 2 View  Result Date: 12/21/2021 CLINICAL DATA:  Altered mental status EXAM: CHEST - 2 VIEW COMPARISON:  01/20/2021 FINDINGS: Post sternotomy changes with valve prosthesis. Right-sided pacing device as before. No focal opacity, pleural effusion or pneumothorax. Cardiomediastinal silhouette within normal limits. IMPRESSION: No active cardiopulmonary disease. Electronically Signed   By: Donavan Foil M.D.   On: 12/21/2021 21:04   DG Ankle Complete Right  Result Date: 12/21/2021 CLINICAL DATA:  Fall.  Pain and swelling of right ankle. EXAM: RIGHT ANKLE - COMPLETE 3+ VIEW COMPARISON:  None. FINDINGS: Moderate lateral and mild medial malleolar degenerative osteophytosis. There are two small 3 x 1 mm cortical foci seen at the distal tip of the fibula suspicious for acute avulsion injuries. Minimal distal medial malleolar degenerative osteophytosis. Small plantar calcaneal heel spur. No dislocation. IMPRESSION: Small avulsion fracture at the distal tip of the fibula with moderate lateral malleolar soft tissue swelling. Electronically Signed   By: Yvonne Kendall M.D.   On:  12/21/2021 19:46   CT HEAD WO CONTRAST (5MM)  Result Date: 12/21/2021 CLINICAL DATA:  Fall injury at home today. EXAM: CT HEAD WITHOUT CONTRAST CT CERVICAL SPINE WITHOUT CONTRAST TECHNIQUE: Multidetector CT imaging of the head and cervical spine was performed following the standard protocol without intravenous contrast. Multiplanar CT image reconstructions of the cervical spine were also generated. RADIATION DOSE REDUCTION: This exam was performed according to the departmental dose-optimization program which includes automated exposure control, adjustment of the mA and/or kV according to patient size and/or use of iterative reconstruction technique. COMPARISON:  Most recent head CT dated 10/31/2018, most recent cervical spine CT dated 10/31/2018. FINDINGS: CT HEAD FINDINGS Brain: There is mild cerebral atrophy, small vessel disease and atrophic ventriculomegaly, stable, with early cerebellar atrophy. No asymmetry is seen concerning for an acute infarct, hemorrhage or mass and there is no midline  shift. Basal cisterns are patent. Vascular: There are scattered calcifications of the carotid siphons but no hyperdense central vessels. Skull: Normal. Negative for fracture or focal lesion. Sinuses/Orbits: No acute finding. 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 Other: None. CT CERVICAL SPINE FINDINGS Alignment: Mild chronic cervical levoscoliosis is noted as well as a 2 mm degenerative anterolisthesis at C5-6 and C7-T1, stable. There is no new or suspicious alignment abnormality. Skull base and vertebrae: Osteopenia. There is patchy fluid in the mid to lower right mastoid air cells new from prior study. Left mastoids are clear. The skull base is intact. No cervical fracture is evident. 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 Soft tissues and spinal canal: Calcifications asymmetrically left carotid bifurcation. No significant prevertebral fluid  or swelling is seen. No canal hematoma is evident. Disc levels: There are chronic disc calcifications and partial vertebral body ankylosis at C3-4 with ankylosis across the C3-4 posterior elements. There is mild disc space loss with endplate spur formation again C4-5 C5-6, moderate disc space loss and small bidirectional osteophytes C6-7. No spondylotic cord compression or herniated discs or other significant soft tissue or bony encroachment on thecal sac is seen. There is multilevel moderate facet joint and uncinate hypertrophy, asymmetric degenerative arthrosis and partial ankylosis of the right C1-2 lateral mass articulation. There is degenerative foraminal stenosis which is moderate to severe on the right at C2-3, C3-4 and C4-5, severe on the right and mild-to-moderate left C5-6 , bilaterally mild C6-7 and moderate on the right at C7-T1. Upper chest: Negative. Other: None. IMPRESSION: 1. No acute intracranial CT findings or interval changes. 2. Osteopenia, degenerative changes and scoliosis of the cervical spine, without evidence fractures or interval changes. 3. Right mastoid effusion new from prior studies. Electronically Signed   By: Telford Nab M.D.   On: 12/21/2021 21:18   CT Cervical Spine Wo Contrast  Result Date: 12/21/2021 CLINICAL DATA:  Fall injury at home today. EXAM: CT HEAD WITHOUT CONTRAST CT CERVICAL SPINE WITHOUT CONTRAST TECHNIQUE: Multidetector CT imaging of the head and cervical spine was performed following the standard protocol without intravenous contrast. Multiplanar CT image reconstructions of the cervical spine were also generated. RADIATION DOSE REDUCTION: This exam was performed according to the departmental dose-optimization program which includes automated exposure control, adjustment of the mA and/or kV according to patient size and/or use of iterative reconstruction technique. COMPARISON:  Most recent head CT dated 10/31/2018, most recent cervical spine CT dated 10/31/2018.  FINDINGS: CT HEAD FINDINGS Brain: There is mild cerebral atrophy, small vessel disease and atrophic ventriculomegaly, stable, with early cerebellar atrophy. No asymmetry is seen concerning for an acute infarct, hemorrhage or mass and there is no midline shift. Basal cisterns are patent. Vascular: There are scattered calcifications of the carotid siphons but no hyperdense central vessels. Skull: Normal. Negative for fracture or focal lesion. Sinuses/Orbits: No acute finding. 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 Other: None. CT CERVICAL SPINE FINDINGS Alignment: Mild chronic cervical levoscoliosis is noted as well as a 2 mm degenerative anterolisthesis at C5-6 and C7-T1, stable. There is no new or suspicious alignment abnormality. Skull base and vertebrae: Osteopenia. There is patchy fluid in the mid to lower right mastoid air cells new from prior study. Left mastoids are clear. The skull base is intact. No cervical fracture is evident. 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 Soft tissues and spinal canal: Calcifications  asymmetrically left carotid bifurcation. No significant prevertebral fluid or swelling is seen. No canal hematoma is evident. Disc levels: There are chronic disc calcifications and partial vertebral body ankylosis at C3-4 with ankylosis across the C3-4 posterior elements. There is mild disc space loss with endplate spur formation again C4-5 C5-6, moderate disc space loss and small bidirectional osteophytes C6-7. No spondylotic cord compression or herniated discs or other significant soft tissue or bony encroachment on thecal sac is seen. There is multilevel moderate facet joint and uncinate hypertrophy, asymmetric degenerative arthrosis and partial ankylosis of the right C1-2 lateral mass articulation. There is degenerative foraminal stenosis which is moderate to severe on the right at C2-3, C3-4 and C4-5, severe on the  right and mild-to-moderate left C5-6 , bilaterally mild C6-7 and moderate on the right at C7-T1. Upper chest: Negative. Other: None. IMPRESSION: 1. No acute intracranial CT findings or interval changes. 2. Osteopenia, degenerative changes and scoliosis of the cervical spine, without evidence fractures or interval changes. 3. Right mastoid effusion new from prior studies. Electronically Signed   By: Telford Nab M.D.   On: 12/21/2021 21:18    Microbiology: Results for orders placed or performed during the hospital encounter of 12/21/21  Urine Culture     Status: Abnormal   Collection Time: 12/21/21  9:16 PM   Specimen: Urine, Random  Result Value Ref Range Status   Specimen Description   Final    URINE, RANDOM Performed at [facility], [address], [place], [place] [address]    Special Requests   Final    NONE Performed at [facility], [address] [place], [place] [address]    Culture (A)  Final    <10,000 COLONIES/mL INSIGNIFICANT GROWTH Performed at [facility], [place] [address], [place], [place] [address]    Report Status 12/23/2021 FINAL  Final  Resp Panel by RT-PCR (Flu A&B, Covid) Nasopharyngeal Swab     Status: None   Collection Time: 12/21/21  9:16 PM   Specimen: Nasopharyngeal Swab; Nasopharyngeal(NP) swabs in vial transport medium  Result Value Ref Range Status   SARS Coronavirus 2 by RT PCR NEGATIVE NEGATIVE Final    Comment: (NOTE) SARS-CoV-2 target nucleic acids are NOT DETECTED.  The SARS-CoV-2 RNA is generally detectable in upper respiratory specimens during the acute phase of infection. The lowest concentration of SARS-CoV-2 viral copies this assay can detect is 138 copies/mL. A negative result does not preclude SARS-Cov-2 infection and should not be used as the sole basis for treatment or other patient management decisions. A negative result may occur with  improper specimen collection/handling, submission of specimen other than  nasopharyngeal swab, presence of viral mutation(s) within the areas targeted by this assay, and inadequate number of viral copies(<138 copies/mL). A negative result must be combined with clinical observations, patient history, and epidemiological information. The expected result is Negative.  Fact Sheet for Patients:  EntrepreneurPulse.com.au  Fact Sheet for Healthcare Providers:  IncredibleEmployment.be  This test is no t yet approved or cleared by the [place] FDA and  has been authorized for detection and/or diagnosis of SARS-CoV-2 by FDA under an Emergency Use Authorization (EUA). This EUA will remain  in effect (meaning this test can be used) for the duration of the COVID-19 declaration under Section 564(b)(1) of the Act, 21 U.S.C.section 360bbb-3(b)(1), unless the authorization is terminated  or revoked sooner.       Influenza A by PCR NEGATIVE NEGATIVE Final   Influenza B by  PCR NEGATIVE NEGATIVE Final    Comment: (NOTE) The Xpert Xpress SARS-CoV-2/FLU/RSV plus assay is intended as an aid in the diagnosis of influenza from Nasopharyngeal swab specimens and should not be used as a sole basis for treatment. Nasal washings and aspirates are unacceptable for Xpert Xpress SARS-CoV-2/FLU/RSV testing.  Fact Sheet for Patients: EntrepreneurPulse.com.au  Fact Sheet for Healthcare Providers: IncredibleEmployment.be  This test is not yet approved or cleared by the [place] FDA and has been authorized for detection and/or diagnosis of SARS-CoV-2 by FDA under an Emergency Use Authorization (EUA). This EUA will remain in effect (meaning this test can be used) for the duration of the COVID-19 declaration under Section 564(b)(1) of the Act, 21 U.S.C. section 360bbb-3(b)(1), unless the authorization is terminated or revoked.  Performed at [facility], [address] [place], [place]  [address]   Culture, blood (routine x 2)     Status: None (Preliminary result)   Collection Time: 12/21/21  9:23 PM   Specimen: BLOOD  Result Value Ref Range Status   Specimen Description BLOOD LEFT ASSIST CONTROL  Final   Special Requests   Final    BOTTLES DRAWN AEROBIC AND ANAEROBIC Blood Culture adequate volume   Culture   Final    NO GROWTH 4 DAYS Performed at [facility], [address] [place], [place] [address]    Report Status PENDING  Incomplete  Culture, blood (routine x 2)     Status: None (Preliminary result)   Collection Time: 12/21/21  9:34 PM   Specimen: BLOOD  Result Value Ref Range Status   Specimen Description BLOOD LEFT HAND  Final   Special Requests   Final    BOTTLES DRAWN AEROBIC AND ANAEROBIC Blood Culture adequate volume   Culture   Final    NO GROWTH 4 DAYS Performed at [facility], [address] [place], [place] [address]    Report Status PENDING  Incomplete    Labs: CBC: Recent Labs  Lab 12/21/21 2116 12/23/21 0509 12/24/21 0433 12/25/21 0558  WBC 12.6* 8.0 5.3 4.0  NEUTROABS 11.2* 6.4 4.0 2.9  HGB 9.0* 8.2* 7.9* 8.2*  HCT 28.0* 25.3* 24.6* 25.5*  MCV 103.3* 102.4* 103.8* 102.4*  PLT 212 178 185 283   Basic Metabolic Panel: Recent Labs  Lab 12/21/21 2116 12/23/21 0509 12/24/21 0433 12/25/21 0558  NA 133* 131* 132* 134*  K 4.9 5.2* 5.0 5.0  CL 107 103 106 106  CO2 18* 22 20* 21*  GLUCOSE 110* 105* 107* 104*  BUN 29* 34* 34* 24*  CREATININE 1.81* 1.94* 1.66* 1.16*  CALCIUM 8.9 8.4* 8.6* 9.0  MG  --  2.0 2.2  --    Liver Function Tests: Recent Labs  Lab 12/21/21 2116  AST 26  ALT 12  ALKPHOS 52  BILITOT 0.6  PROT 7.1  ALBUMIN 4.0   CBG: No results for input(s): GLUCAP in the last 168 hours.  Discharge time spent: greater than 30 minutes.  Signed: Sharen Hones, MD [facility] 12/25/2021

## 2021-12-25 NOTE — Progress Notes (Signed)
PHARMACY NOTE:  ANTIMICROBIAL RENAL DOSAGE ADJUSTMENT ? ?Current antimicrobial regimen includes a mismatch between antimicrobial dosage and estimated renal function.  As per policy approved by the Pharmacy & Therapeutics and Medical Executive Committees, the antimicrobial dosage will be adjusted accordingly. ? ?Current antimicrobial dosage:  Augmentin 500-125 mg BID ? ?Indication: Cat bite ? ?Renal Function: ? ?Estimated Creatinine Clearance: 41.6 mL/min (A) (by C-G formula based on SCr of 1.16 mg/dL (H)). ?   ?Antimicrobial dosage has been changed to:  Augmentin 875-125 mg BID ? ?Thank you for allowing pharmacy to be a part of this patient's care. ? ?Benita Gutter, RPH ?12/25/2021 7:21 AM ? ? ?   ?

## 2021-12-25 NOTE — Assessment & Plan Note (Signed)
Patient had a mild hyperkalemia associated with acute renal failure.  Potassium has normalized.  We will continue to hold off Aldactone at discharge. ?

## 2021-12-25 NOTE — TOC Transition Note (Signed)
Transition of Care (TOC) - CM/SW Discharge Note ? ? ?Patient Details  ?Name: [name] ?MRN: [id number] ?Date of Birth: 04-10-1946 ? ?Transition of Care (TOC) CM/SW Contact:  ?Boris Sharper, LCSW ?Phone Number: ?12/25/2021, 1:25 PM ? ? ?Clinical Narrative:    ?Pt medially stable for discharge per MD. Pt will be transported home by her granddaughter Jeannetta Nap. Pt would now like to go home with [facility]. CSW spoke with Malachy Mood and arranged [place] PT, OT and RN with [facility].  ? ? ?Final next level of care: [place] ?Barriers to Discharge: No Barriers Identified ? ? ?Patient Goals and CMS Choice ?Patient states their goals for this hospitalization and ongoing recovery are:: to return home ?  ?Choice offered to / list presented to : Patient ? ?Discharge Placement ?  ?           ?  ?Patient to be transferred to facility by: Granddaughter ?Name of family member notified: Miranda ?Patient and family notified of of transfer: 12/25/21 ? ?Discharge Plan and Services ?  ?Discharge Planning Services: NA ?Post Acute Care Choice: NA          ?  ?  ?  ?  ?  ?HH Arranged: PT, OT, RN ?[place] Agency: [place] ?Date HH Agency Contacted: 12/25/21 ?Time [place]: 5784 ?Representative spoke with at [place]: Malachy Mood ? ?Social Determinants of Health (SDOH) Interventions ?  ? ? ?Readmission Risk Interventions ?No flowsheet data found. ? ? ? ? ?

## 2021-12-25 NOTE — Progress Notes (Signed)
PHARMACIST - PHYSICIAN COMMUNICATION ? ?CONCERNING:  Enoxaparin (Lovenox) for DVT Prophylaxis  ? ? ?RECOMMENDATION: ?Patient was prescribed enoxaparin 30mg  q24 hours for VTE prophylaxis.  ? Danley Danker Weights  ? 12/21/21 1856  ?Weight: 72.6 kg (160 lb)  ? ? ?Body mass index is 27.46 kg/m?. ? ?Estimated Creatinine Clearance: 41.6 mL/min (A) (by C-G formula based on SCr of 1.16 mg/dL (H)). ? ? ?Patient is candidate for enoxaparin 40mg  every 24 hours based on CrCl >41ml/min and Weight >45kg ? ?DESCRIPTION: ?Pharmacy has adjusted enoxaparin dose per [facility] policy. ? ?Patient is now receiving enoxaparin 40 mg every 24 hours  ? ?Benita Gutter ?12/25/2021 ?7:18 AM  ?

## 2021-12-25 NOTE — Progress Notes (Signed)
Physical Therapy Treatment ?Patient Details ?Name: [name] ?MRN: [id number] ?DOB: [birth year] ?Today's Date: 12/25/2021 ? ? ?History of Present Illness Pt is a 76 y.o. female with medical history significant for CKD 3a, B12 deficiency with macrocytic anemia and neuropathy, chronic systolic heart failure (EF 04/2019 30-35%), V-fib arrest s/p AICD, HTN and hypothyroidism who presents to the ED following a fall in which she sustained a fibular fracture at the right ankle.  The fall occurred in the setting of altered mental status while being treated for an acute cellulitis of the right forearm that developed from cat bite.  MD assessment also includes metabolic acidosis, normal anion gap, acute renal failure, and macrocytic anemia. ? ?  ?PT Comments  ? ? Pt seen for PT tx with pt agreeable, only endorsing mild chronic low back pain. Pt declines use of RLE boot at this time. Pt is able to complete transfers with mod I & ambulate into hallway with RW & supervision with ongoing cuing re: proper positioning/use of AD. Pt demonstrates decreased endurance as she requires 2-3 standing rest breaks during ambulation 2/2 fatigue. While pt is making good progress she is unsafe to d/c home alone where she has multiple steps to enter the home. Continue to recommend STR upon d/c to maximize independence with mobility & reduce fall risk prior to return home alone with only PRN assist from family. ?   ?Recommendations for follow up therapy are one component of a multi-disciplinary discharge planning process, led by the attending physician.  Recommendations may be updated based on patient status, additional functional criteria and insurance authorization. ? ?Follow Up Recommendations ? Skilled nursing-short term rehab (<3 hours/day) ?  ?  ?Assistance Recommended at Discharge Intermittent Supervision/Assistance  ?Patient can return home with the following A little help with walking and/or transfers;A little help with  bathing/dressing/bathroom;Assistance with cooking/housework;Assist for transportation;Help with stairs or ramp for entrance ?  ?Equipment Recommendations ? Rolling walker (2 wheels)  ?  ?Recommendations for Other Services   ? ? ?  ?Precautions / Restrictions Precautions ?Precautions: Fall ?Restrictions ?Weight Bearing Restrictions: Yes ?RLE Weight Bearing: Weight bearing as tolerated ?Other Position/Activity Restrictions: Walking boot in room.  Per ortho MD, boot is for comfort only, may WB without  ?  ? ?Mobility ? Bed Mobility ?  ?Bed Mobility: Supine to Sit ?  ?  ?Supine to sit: Modified independent (Device/Increase time), HOB elevated ?  ?  ?  ?  ? ?Transfers ?Overall transfer level: Modified independent ?Equipment used:  (1UE pushing to stand on bed rail) ?Transfers: Sit to/from Stand, Bed to chair/wheelchair/BSC ?Sit to Stand: Modified independent (Device/Increase time) ?  ?Step pivot transfers: Modified independent (Device/Increase time) ?  ?  ?  ?  ?  ? ?Ambulation/Gait ?Ambulation/Gait assistance: Supervision ?Gait Distance (Feet): 60 Feet ?Assistive device: Rolling walker (2 wheels) ?Gait Pattern/deviations: Decreased step length - right, Decreased step length - left, Decreased stride length ?Gait velocity: decreased ?  ?  ?General Gait Details: Cuing to ambulate within base of AD, requires 2-3 standing rest breaks 2/2 fatigue. ? ? ?Stairs ?  ?  ?  ?  ?  ? ? ?Wheelchair Mobility ?  ? ?Modified Rankin (Stroke Patients Only) ?  ? ? ?  ?Balance Overall balance assessment: Needs assistance ?Sitting-balance support: Bilateral upper extremity supported, Feet unsupported ?Sitting balance-Leahy Scale: Good ?  ?  ?Standing balance support: Bilateral upper extremity supported, During functional activity ?Standing balance-Leahy Scale: Fair ?  ?  ?  ?  ?  ?  ?  ?  ?  ?  ?  ?  ?  ? ?  ?  Cognition Arousal/Alertness: Awake/alert ?Behavior During Therapy: [facility] for tasks assessed/performed ?Overall Cognitive Status: Within  Functional Limits for tasks assessed ?  ?  ?  ?  ?  ?  ?  ?  ?  ?  ?  ?  ?  ?  ?  ?  ?  ?  ?  ? ?  ?Exercises   ? ?  ?General Comments   ?  ?  ? ?Pertinent Vitals/Pain Pain Assessment ?Pain Assessment: No/denies pain ?Faces Pain Scale: Hurts a little bit ?Pain Location: chronic low back apin ?Pain Descriptors / Indicators: Discomfort ?Pain Intervention(s): Monitored during session  ? ? ?Home Living   ?  ?  ?  ?  ?  ?  ?  ?  ?  ?   ?  ?Prior Function    ?  ?  ?   ? ?PT Goals (current goals can now be found in the care plan section) Acute Rehab PT Goals ?Patient Stated Goal: To be able to run errands and be independent again ?PT Goal Formulation: With patient ?Time For Goal Achievement: 01/04/22 ?Potential to Achieve Goals: Good ?Progress towards PT goals: Progressing toward goals ? ?  ?Frequency ? ? ? 7X/week ? ? ? ?  ?PT Plan Current plan remains appropriate  ? ? ?Co-evaluation   ?  ?  ?  ?  ? ?  ?AM-PAC PT "6 Clicks" Mobility   ?Outcome Measure ? Help needed turning from your back to your side while in a flat bed without using bedrails?: None ?Help needed moving from lying on your back to sitting on the side of a flat bed without using bedrails?: None ?Help needed moving to and from a bed to a chair (including a wheelchair)?: None ?Help needed standing up from a chair using your arms (e.g., wheelchair or bedside chair)?: None ?Help needed to walk in hospital room?: A Little ?Help needed climbing 3-5 steps with a railing? : A Lot ?6 Click Score: 21 ? ?  ?End of Session   ?Activity Tolerance: Patient tolerated treatment well;Patient limited by fatigue ?Patient left: in chair;with chair alarm set;with call bell/phone within reach ?Nurse Communication: Mobility status ?PT Visit Diagnosis: Unsteadiness on feet (R26.81);Other abnormalities of gait and mobility (R26.89);Muscle weakness (generalized) (M62.81) ?  ? ? ?Time: 8478-4128 ?PT Time Calculation (min) (ACUTE ONLY): 10 min ? ?Charges:  $Therapeutic Activity: 8-22  mins          ?          ? ?Lavone Nian, PT, DPT ?12/25/21, 11:21 AM ? ? ? ?Waunita Schooner ?12/25/2021, 11:19 AM ? ?

## 2021-12-26 LAB — CULTURE, BLOOD (ROUTINE X 2)
Culture: NO GROWTH
Culture: NO GROWTH
Special Requests: ADEQUATE
Special Requests: ADEQUATE

## 2021-12-28 ENCOUNTER — Ambulatory Visit: Payer: Medicare Other | Admitting: Pain Medicine

## 2021-12-28 ENCOUNTER — Other Ambulatory Visit: Payer: Self-pay

## 2021-12-28 ENCOUNTER — Emergency Department
Admission: EM | Admit: 2021-12-28 | Discharge: 2021-12-28 | Disposition: A | Discharge status: Home / Self Care | Payer: Medicare Other | Attending: Emergency Medicine | Admitting: Emergency Medicine

## 2021-12-28 DIAGNOSIS — Z2914 Encounter for prophylactic rabies immune globin: Secondary | ICD-10-CM | POA: Insufficient documentation

## 2021-12-28 DIAGNOSIS — Z203 Contact with and (suspected) exposure to rabies: Secondary | ICD-10-CM | POA: Diagnosis not present

## 2021-12-28 DIAGNOSIS — Z23 Encounter for immunization: Secondary | ICD-10-CM | POA: Diagnosis not present

## 2021-12-28 MED ORDER — RABIES VACCINE, PCEC IM SUSR
1.0000 mL | Freq: Once | INTRAMUSCULAR | Status: AC
  Administered 2021-12-28: 1 mL via INTRAMUSCULAR
  Filled 2021-12-28: qty 1

## 2021-12-28 NOTE — ED Triage Notes (Signed)
Pt is here for her day 7 rabies injection ?

## 2021-12-28 NOTE — ED Provider Notes (Signed)
? ?  [facility] ?Provider Note ? ? ? Event Date/Time  ? First MD Initiated Contact with Patient 12/28/21 1341   ?  (approximate) ? ? ?History  ? ?Rabies Injection ? ? ?HPI ? ?[name] is a 76 y.o. female   is here for third rabies injection.  She denies having any problems at this time. ? ?  ? ? ?Physical Exam  ? ?Triage Vital Signs: ?ED Triage Vitals  ?Enc Vitals Group  ?   BP 12/28/21 1331 (!) 141/83  ?   Pulse Rate 12/28/21 1333 77  ?   Resp 12/28/21 1331 18  ?   Temp 12/28/21 1331 (!) 97.5 ?F (36.4 ?C)  ?   Temp Source 12/28/21 1331 Oral  ?   SpO2 12/28/21 1331 98 %  ?   Weight 12/28/21 1338 159 lb 13.3 oz (72.5 kg)  ?   Height 12/28/21 1338 5\' 4"  (1.626 m)  ?   Head Circumference --   ?   Peak Flow --   ?   Pain Score --   ?   Pain Loc --   ?   Pain Edu? --   ?   Excl. in [place]? --   ? ? ?Most recent vital signs: ?Vitals:  ? 12/28/21 1331 12/28/21 1333  ?BP: (!) 141/83   ?Pulse:  77  ?Resp: 18   ?Temp: (!) 97.5 ?F (36.4 ?C)   ?SpO2: 98%   ? ? ? ?General: Awake, no distress.  ?CV:  Good peripheral perfusion.  Heart regular rate and rhythm. ?Resp:  Normal effort.  Lungs are clear bilaterally. ?Abd:  No distention.  ?Other:   ? ? ?ED Results / Procedures / Treatments  ? ?Labs ?(all labs ordered are listed, but only abnormal results are displayed) ?Labs Reviewed - No data to display ? ? ? ?PROCEDURES: ? ?Critical Care performed:  ? ?Procedures ? ? ?MEDICATIONS ORDERED IN ED: ?Medications  ?rabies vaccine (RABAVERT) injection 1 mL (1 mL Intramuscular Given 12/28/21 1345)  ? ? ? ?IMPRESSION / MDM / ASSESSMENT AND PLAN / ED COURSE  ?I reviewed the triage vital signs and the nursing notes. ? ? ?Differential diagnosis includes, but is not limited to, need for immunization against rabies. ? ?76 year old female presents to the ED for her third immunization against rabies.  Patient is aware that she needs to return 1 more time for the last rabies injection. ? ? ? ?  ? ? ?FINAL CLINICAL IMPRESSION(S) /  ED DIAGNOSES  ? ?Final diagnoses:  ?Need for immunization against rabies  ? ? ? ?Rx / DC Orders  ? ?ED Discharge Orders   ? ? None  ? ?  ? ? ? ?Note:  This document was prepared using Dragon voice recognition software and may include unintentional dictation errors. ?  ?Johnn Hai, PA-C ?12/28/21 1350 ? ?  ?Lavonia Drafts, MD ?12/28/21 1407 ? ?

## 2021-12-28 NOTE — Discharge Instructions (Signed)
Return for remaining rabies injection next week. ?

## 2021-12-29 DIAGNOSIS — S82401D Unspecified fracture of shaft of right fibula, subsequent encounter for closed fracture with routine healing: Secondary | ICD-10-CM | POA: Diagnosis not present

## 2021-12-29 DIAGNOSIS — S51831D Puncture wound without foreign body of right forearm, subsequent encounter: Secondary | ICD-10-CM | POA: Diagnosis not present

## 2021-12-29 DIAGNOSIS — E039 Hypothyroidism, unspecified: Secondary | ICD-10-CM | POA: Diagnosis not present

## 2021-12-29 DIAGNOSIS — D509 Iron deficiency anemia, unspecified: Secondary | ICD-10-CM | POA: Diagnosis not present

## 2021-12-29 DIAGNOSIS — G9341 Metabolic encephalopathy: Secondary | ICD-10-CM | POA: Diagnosis not present

## 2021-12-29 DIAGNOSIS — N1831 Chronic kidney disease, stage 3a: Secondary | ICD-10-CM | POA: Diagnosis not present

## 2021-12-29 DIAGNOSIS — L089 Local infection of the skin and subcutaneous tissue, unspecified: Secondary | ICD-10-CM | POA: Diagnosis not present

## 2021-12-29 DIAGNOSIS — D631 Anemia in chronic kidney disease: Secondary | ICD-10-CM | POA: Diagnosis not present

## 2021-12-29 DIAGNOSIS — I5022 Chronic systolic (congestive) heart failure: Secondary | ICD-10-CM | POA: Diagnosis not present

## 2021-12-29 DIAGNOSIS — I130 Hypertensive heart and chronic kidney disease with heart failure and stage 1 through stage 4 chronic kidney disease, or unspecified chronic kidney disease: Secondary | ICD-10-CM | POA: Diagnosis not present

## 2022-01-04 ENCOUNTER — Emergency Department
Admission: EM | Admit: 2022-01-04 | Discharge: 2022-01-04 | Disposition: A | Discharge status: Home / Self Care | Payer: Medicare Other | Attending: Emergency Medicine | Admitting: Emergency Medicine

## 2022-01-04 ENCOUNTER — Other Ambulatory Visit: Payer: Self-pay

## 2022-01-04 ENCOUNTER — Encounter: Payer: Self-pay | Admitting: Intensive Care

## 2022-01-04 DIAGNOSIS — I130 Hypertensive heart and chronic kidney disease with heart failure and stage 1 through stage 4 chronic kidney disease, or unspecified chronic kidney disease: Secondary | ICD-10-CM | POA: Diagnosis not present

## 2022-01-04 DIAGNOSIS — D509 Iron deficiency anemia, unspecified: Secondary | ICD-10-CM | POA: Diagnosis not present

## 2022-01-04 DIAGNOSIS — G9341 Metabolic encephalopathy: Secondary | ICD-10-CM | POA: Diagnosis not present

## 2022-01-04 DIAGNOSIS — N1831 Chronic kidney disease, stage 3a: Secondary | ICD-10-CM | POA: Diagnosis not present

## 2022-01-04 DIAGNOSIS — S82401D Unspecified fracture of shaft of right fibula, subsequent encounter for closed fracture with routine healing: Secondary | ICD-10-CM | POA: Diagnosis not present

## 2022-01-04 DIAGNOSIS — D631 Anemia in chronic kidney disease: Secondary | ICD-10-CM | POA: Diagnosis not present

## 2022-01-04 DIAGNOSIS — E039 Hypothyroidism, unspecified: Secondary | ICD-10-CM | POA: Diagnosis not present

## 2022-01-04 DIAGNOSIS — Z203 Contact with and (suspected) exposure to rabies: Secondary | ICD-10-CM | POA: Insufficient documentation

## 2022-01-04 DIAGNOSIS — L089 Local infection of the skin and subcutaneous tissue, unspecified: Secondary | ICD-10-CM | POA: Diagnosis not present

## 2022-01-04 DIAGNOSIS — Z23 Encounter for immunization: Secondary | ICD-10-CM | POA: Insufficient documentation

## 2022-01-04 DIAGNOSIS — Z2914 Encounter for prophylactic rabies immune globin: Secondary | ICD-10-CM | POA: Diagnosis not present

## 2022-01-04 DIAGNOSIS — I5022 Chronic systolic (congestive) heart failure: Secondary | ICD-10-CM | POA: Diagnosis not present

## 2022-01-04 DIAGNOSIS — S51831D Puncture wound without foreign body of right forearm, subsequent encounter: Secondary | ICD-10-CM | POA: Diagnosis not present

## 2022-01-04 HISTORY — DX: Disorder of kidney and ureter, unspecified: N28.9

## 2022-01-04 MED ORDER — RABIES VACCINE, PCEC IM SUSR
1.0000 mL | Freq: Once | INTRAMUSCULAR | Status: AC
  Administered 2022-01-04: 1 mL via INTRAMUSCULAR
  Filled 2022-01-04: qty 1

## 2022-01-04 NOTE — ED Provider Notes (Signed)
? ?[facility] ?Provider Note ? ?Patient Contact: 5:19 PM (approximate) ? ? ?History  ? ?Rabies Injection ? ? ?HPI ? ?Karita Dralle is a 76 y.o. female who presents the emergency department needing her fourth and final rabies vaccination.  I saw the patient initially after she was bitten by a stray cat.  She had significant cellulitis, had had multiple falls due to weakness secondary to her infection.  She was admitted at that time, received IV antibiotics for 4 days.  Patient has done well in regards of infection.  Has no residual wounds, denies any pain, erythema or edema to the right forearm.  She is here primarily for her fourth and final rabies vaccination.  No other complaints at this time ?  ? ? ?Physical Exam  ? ?Triage Vital Signs: ?ED Triage Vitals  ?Enc Vitals Group  ?   BP 01/04/22 1608 114/88  ?   Pulse Rate 01/04/22 1608 60  ?   Resp 01/04/22 1608 16  ?   Temp 01/04/22 1608 98.4 ?F (36.9 ?C)  ?   Temp Source 01/04/22 1608 Oral  ?   SpO2 01/04/22 1608 94 %  ?   Weight 01/04/22 1609 160 lb (72.6 kg)  ?   Height 01/04/22 1609 5\' 4"  (1.626 m)  ?   Head Circumference --   ?   Peak Flow --   ?   Pain Score 01/04/22 1608 4  ?   Pain Loc --   ?   Pain Edu? --   ?   Excl. in [place]? --   ? ? ?Most recent vital signs: ?Vitals:  ? 01/04/22 1608  ?BP: 114/88  ?Pulse: 60  ?Resp: 16  ?Temp: 98.4 ?F (36.9 ?C)  ?SpO2: 94%  ? ? ? ?General: Alert and in no acute distress.  ?Cardiovascular:  Good peripheral perfusion ?Respiratory: Normal respiratory effort without tachypnea or retractions. Lungs CTAB.  ?Musculoskeletal: Full range of motion to all extremities.  Visualization of the right forearm reveals no residual erythema, edema, or puncture wound from initial cat bite. ?Neurologic:  No gross focal neurologic deficits are appreciated.  ?Skin:   No rash noted ?Other: ? ? ?ED Results / Procedures / Treatments  ? ?Labs ?(all labs ordered are listed, but only abnormal results are displayed) ?Labs Reviewed  - No data to display ? ? ?EKG ? ? ? ? ?RADIOLOGY ? ? ? ?No results found. ? ?PROCEDURES: ? ?Critical Care performed: No ? ?Procedures ? ? ?MEDICATIONS ORDERED IN ED: ?Medications  ?rabies vaccine (RABAVERT) injection 1 mL (1 mL Intramuscular Given 01/04/22 1643)  ? ? ? ?IMPRESSION / MDM / ASSESSMENT AND PLAN / ED COURSE  ?I reviewed the triage vital signs and the nursing notes. ?             ?               ? ?Differential diagnosis includes, but is not limited to, need for rabies vaccine. ? ? ?Patient's diagnosis is consistent with need for fourth and final rabies vaccine.  Patient presents the emergency department for her fourth rabies vaccine.  Patient was seen 2 weeks ago by myself after being bit by a stray cat.  She had significant cellulitis to her right forearm, was weak and had suffered multiple falls from her infection.  She required admission at that time with IV antibiotics.  She was given rabies immunoglobin, first vaccine.  Today is her encounter for her fourth and  final rabies vaccine.  She denies any ongoing complaints to the right forearm where the original bite occurred.  She denied any other complaints at this time.  She is given her fourth rabies vaccine and discharged at this time.. Patient is given ED precautions to return to the ED for any worsening or new symptoms. ? ? ? ?  ? ? ?FINAL CLINICAL IMPRESSION(S) / ED DIAGNOSES  ? ?Final diagnoses:  ?Need for rabies vaccination  ? ? ? ?Rx / DC Orders  ? ?ED Discharge Orders   ? ? None  ? ?  ? ? ? ?Note:  This document was prepared using Dragon voice recognition software and may include unintentional dictation errors. ?  ?Darletta Moll, PA-C ?01/04/22 1721 ? ?  ?Rada Hay, MD ?01/04/22 2347 ? ?

## 2022-01-04 NOTE — ED Triage Notes (Signed)
Patient is here for her 4th rabies injection ?

## 2022-01-06 ENCOUNTER — Encounter: Payer: Medicare Other | Admitting: Physician Assistant

## 2022-01-11 DIAGNOSIS — I5022 Chronic systolic (congestive) heart failure: Secondary | ICD-10-CM | POA: Diagnosis not present

## 2022-01-11 DIAGNOSIS — S51831D Puncture wound without foreign body of right forearm, subsequent encounter: Secondary | ICD-10-CM | POA: Diagnosis not present

## 2022-01-11 DIAGNOSIS — D509 Iron deficiency anemia, unspecified: Secondary | ICD-10-CM | POA: Diagnosis not present

## 2022-01-11 DIAGNOSIS — E039 Hypothyroidism, unspecified: Secondary | ICD-10-CM | POA: Diagnosis not present

## 2022-01-11 DIAGNOSIS — N1831 Chronic kidney disease, stage 3a: Secondary | ICD-10-CM | POA: Diagnosis not present

## 2022-01-11 DIAGNOSIS — G9341 Metabolic encephalopathy: Secondary | ICD-10-CM | POA: Diagnosis not present

## 2022-01-11 DIAGNOSIS — S82401D Unspecified fracture of shaft of right fibula, subsequent encounter for closed fracture with routine healing: Secondary | ICD-10-CM | POA: Diagnosis not present

## 2022-01-11 DIAGNOSIS — D631 Anemia in chronic kidney disease: Secondary | ICD-10-CM | POA: Diagnosis not present

## 2022-01-11 DIAGNOSIS — I130 Hypertensive heart and chronic kidney disease with heart failure and stage 1 through stage 4 chronic kidney disease, or unspecified chronic kidney disease: Secondary | ICD-10-CM | POA: Diagnosis not present

## 2022-01-11 DIAGNOSIS — L089 Local infection of the skin and subcutaneous tissue, unspecified: Secondary | ICD-10-CM | POA: Diagnosis not present

## 2022-01-12 DIAGNOSIS — Z139 Encounter for screening, unspecified: Secondary | ICD-10-CM | POA: Diagnosis not present

## 2022-01-12 DIAGNOSIS — Z7689 Persons encountering health services in other specified circumstances: Secondary | ICD-10-CM | POA: Diagnosis not present

## 2022-01-12 DIAGNOSIS — W5501XD Bitten by cat, subsequent encounter: Secondary | ICD-10-CM | POA: Diagnosis not present

## 2022-01-12 DIAGNOSIS — I5020 Unspecified systolic (congestive) heart failure: Secondary | ICD-10-CM | POA: Diagnosis not present

## 2022-01-12 DIAGNOSIS — I4891 Unspecified atrial fibrillation: Secondary | ICD-10-CM | POA: Diagnosis not present

## 2022-01-12 DIAGNOSIS — S82839A Other fracture of upper and lower end of unspecified fibula, initial encounter for closed fracture: Secondary | ICD-10-CM | POA: Diagnosis not present

## 2022-01-12 DIAGNOSIS — M25571 Pain in right ankle and joints of right foot: Secondary | ICD-10-CM | POA: Diagnosis not present

## 2022-01-12 DIAGNOSIS — S51851D Open bite of right forearm, subsequent encounter: Secondary | ICD-10-CM | POA: Diagnosis not present

## 2022-01-12 DIAGNOSIS — Z79899 Other long term (current) drug therapy: Secondary | ICD-10-CM | POA: Diagnosis not present

## 2022-01-12 DIAGNOSIS — L089 Local infection of the skin and subcutaneous tissue, unspecified: Secondary | ICD-10-CM | POA: Diagnosis not present

## 2022-01-18 DIAGNOSIS — L089 Local infection of the skin and subcutaneous tissue, unspecified: Secondary | ICD-10-CM | POA: Diagnosis not present

## 2022-01-18 DIAGNOSIS — G9341 Metabolic encephalopathy: Secondary | ICD-10-CM | POA: Diagnosis not present

## 2022-01-18 DIAGNOSIS — D509 Iron deficiency anemia, unspecified: Secondary | ICD-10-CM | POA: Diagnosis not present

## 2022-01-18 DIAGNOSIS — E039 Hypothyroidism, unspecified: Secondary | ICD-10-CM | POA: Diagnosis not present

## 2022-01-18 DIAGNOSIS — S51831D Puncture wound without foreign body of right forearm, subsequent encounter: Secondary | ICD-10-CM | POA: Diagnosis not present

## 2022-01-18 DIAGNOSIS — D631 Anemia in chronic kidney disease: Secondary | ICD-10-CM | POA: Diagnosis not present

## 2022-01-18 DIAGNOSIS — S82401D Unspecified fracture of shaft of right fibula, subsequent encounter for closed fracture with routine healing: Secondary | ICD-10-CM | POA: Diagnosis not present

## 2022-01-18 DIAGNOSIS — N1831 Chronic kidney disease, stage 3a: Secondary | ICD-10-CM | POA: Diagnosis not present

## 2022-01-18 DIAGNOSIS — I130 Hypertensive heart and chronic kidney disease with heart failure and stage 1 through stage 4 chronic kidney disease, or unspecified chronic kidney disease: Secondary | ICD-10-CM | POA: Diagnosis not present

## 2022-01-18 DIAGNOSIS — I5022 Chronic systolic (congestive) heart failure: Secondary | ICD-10-CM | POA: Diagnosis not present

## 2022-01-20 DIAGNOSIS — M25571 Pain in right ankle and joints of right foot: Secondary | ICD-10-CM | POA: Diagnosis not present

## 2022-01-24 ENCOUNTER — Other Ambulatory Visit: Payer: Self-pay | Admitting: Pain Medicine

## 2022-01-24 DIAGNOSIS — M8580 Other specified disorders of bone density and structure, unspecified site: Secondary | ICD-10-CM

## 2022-01-24 DIAGNOSIS — I5022 Chronic systolic (congestive) heart failure: Secondary | ICD-10-CM | POA: Diagnosis not present

## 2022-01-24 DIAGNOSIS — L089 Local infection of the skin and subcutaneous tissue, unspecified: Secondary | ICD-10-CM | POA: Diagnosis not present

## 2022-01-24 DIAGNOSIS — G9341 Metabolic encephalopathy: Secondary | ICD-10-CM | POA: Diagnosis not present

## 2022-01-24 DIAGNOSIS — E559 Vitamin D deficiency, unspecified: Secondary | ICD-10-CM

## 2022-01-24 DIAGNOSIS — D631 Anemia in chronic kidney disease: Secondary | ICD-10-CM | POA: Diagnosis not present

## 2022-01-24 DIAGNOSIS — M8588 Other specified disorders of bone density and structure, other site: Secondary | ICD-10-CM

## 2022-01-24 DIAGNOSIS — I130 Hypertensive heart and chronic kidney disease with heart failure and stage 1 through stage 4 chronic kidney disease, or unspecified chronic kidney disease: Secondary | ICD-10-CM | POA: Diagnosis not present

## 2022-01-24 DIAGNOSIS — S51831D Puncture wound without foreign body of right forearm, subsequent encounter: Secondary | ICD-10-CM | POA: Diagnosis not present

## 2022-01-24 DIAGNOSIS — D509 Iron deficiency anemia, unspecified: Secondary | ICD-10-CM | POA: Diagnosis not present

## 2022-01-24 DIAGNOSIS — S82401D Unspecified fracture of shaft of right fibula, subsequent encounter for closed fracture with routine healing: Secondary | ICD-10-CM | POA: Diagnosis not present

## 2022-01-24 DIAGNOSIS — E039 Hypothyroidism, unspecified: Secondary | ICD-10-CM | POA: Diagnosis not present

## 2022-01-24 DIAGNOSIS — N1831 Chronic kidney disease, stage 3a: Secondary | ICD-10-CM | POA: Diagnosis not present

## 2022-01-30 NOTE — Progress Notes (Signed)
? ?Cardiology Office Note ?Date:  01/31/2022  ?Patient ID:  [name], DOB [birth year], MRN [id number] ?PCP:  Leonides Sake, MD  ?Cardiologist:  Dr. Terrence Dupont ?Electrophysiologist: Dr. Lovena Le ? ?  ?Chief Complaint:  ERI ? ?History of Present Illness: ?Muriel Hannold is a 76 y.o. female with history of VF arrest , NICM, > ICD, VHD (s/p MVRepair 2014), HTN, HLD, hypothyroidism, GERD, CKD (IIIa) ? ?She had a brief HF hospitalization April 2022, managed with Dr. Terrence Dupont ? ?She comes in today to be seen for Dr. Lovena Le, last seen by him Sept 2022, she was doing OK< ne,ring ERI ? ?She was hospitalized 12/21/21 - 12/25/21 after a fall suffered R ankle tib/fib fracture, no syncope reported ?She was also noted to have R forearm cellulitis after a cat bite, treated/planned 7 day antibiotic treatment and completion of rabies vaccination regime. ?Ortho for out patient follow up ?Not felt to be volume OL, her aldactone held with AKI ? ?Device clinic noted that she reached ERI 12/05/21 ? ?TODAY ?She is accompanied by her daughter in law. ?She is doing well ?Still wearing a walking orthopedic boot and ongoing ankle pain, though reportedly ankle is healed. ?Her arm also well healed up. ?She denies any CP, palpitations or cardiac awareness. ?No near syncope or syncope. ?Denies any SOB ? ?She follows regularly with Dr. Terrence Dupont ? ? ?Device information ?[employer] single chamber ICD implanted 09/09/2009 ?Secondary prevention ? ?Amiodarone goes back to 2014 ? ? ?Past Medical History:  ?Diagnosis Date  ? Anxiety   ? Arthritis   ? Automatic implantable cardioverter-defibrillator in situ   ? Cardiomegaly   ? CHF (congestive heart failure) ([place]) 05/2013  ? class III, severe  ? Chronic bronchitis ([place])   ? Depression   ? GERD (gastroesophageal reflux disease)   ? Headache(784.0)   ? migraines  ? Heart murmur   ? History of sudden cardiac arrest successfully resuscitated 2011  ? HLD (hyperlipidemia)   ? Hypertension   ? Hypothyroidism   ? IBS  (irritable bowel syndrome)   ? Paroxysmal atrial fibrillation (HCC)   ? Pneumonia   ? Renal disorder   ? Scoliosis   ? Severe mitral regurgitation 05/2013  ? s/p MV repair with annuloplasty  ? ? ?Past Surgical History:  ?Procedure Laterality Date  ? ABDOMINAL HYSTERECTOMY    ? APPENDECTOMY    ? CARDIAC CATHETERIZATION    ? CARDIAC DEFIBRILLATOR PLACEMENT  2010  ? Vfib arrest, single chamber ICD  ? CARPAL TUNNEL RELEASE Right 03/05/2014  ? Procedure: RIGHT CARPAL TUNNEL RELEASE;  Surgeon: Schuyler Amor, MD;  Location: [facility];  Service: Orthopedics;  Laterality: Right;  ? CATARACT EXTRACTION W/PHACO Left 09/03/2020  ? Procedure: CATARACT EXTRACTION PHACO AND INTRAOCULAR LENS PLACEMENT ([place]) LEFT;  Surgeon: Birder Robson, MD;  Location: [facility];  Service: Ophthalmology;  Laterality: Left;  Lot # [id number] ?[place]: 01:04.1 ?CDE:11:62 ?  ? CATARACT EXTRACTION W/PHACO Right 12/31/2020  ? Procedure: CATARACT EXTRACTION PHACO AND INTRAOCULAR LENS PLACEMENT ([place]) RIGHT;  Surgeon: Birder Robson, MD;  Location: [facility];  Service: Ophthalmology;  Laterality: Right;  Lot #[id number] ?[place]: 00:55.8 ?CDE: 6.29  ? COLONOSCOPY    ? FRACTURE SURGERY    ? HERNIA REPAIR  as child  ? x 2  ? INTRAOPERATIVE TRANSESOPHAGEAL ECHOCARDIOGRAM N/A 05/24/2013  ? Procedure: INTRAOPERATIVE TRANSESOPHAGEAL ECHOCARDIOGRAM;  Surgeon: Ivin Poot, MD;  Location: [facility];  Service: Open Heart Surgery;  Laterality: N/A;  ? LEFT HEART CATHETERIZATION WITH CORONARY ANGIOGRAM N/A 05/16/2013  ?  Procedure: LEFT HEART CATHETERIZATION WITH CORONARY ANGIOGRAM;  Surgeon: Clent Demark, MD;  Location: [facility] C[facility] LAB;  Service: Cardiovascular;  Laterality: N/A;  ? MITRAL VALVE REPAIR N/A 05/24/2013  ? Surgeon: Ivin Poot, MD; Service: Open Heart Surgery  ? MULTIPLE EXTRACTIONS WITH ALVEOLOPLASTY N/A 05/21/2013  ? Procedure: Extraction of tooth #'s 2,4,18 with alveoloplasty and gross debridement of remaining teeth;  Surgeon: Lenn Cal, DDS;  Location: [facility];  Service: Oral Surgery;  Laterality: N/A;  ? OPEN REDUCTION INTERNAL FIXATION (ORIF) DISTAL RADIAL FRACTURE Right 03/05/2014  ? Procedure: OPEN REDUCTION INTERNAL FIXATION (ORIF) RIGHT DISTAL RADIUS FRACTURE ;  Surgeon: Schuyler Amor, MD;  Location: [facility];  Service: Orthopedics;  Laterality: Right;  ? TEE WITHOUT CARDIOVERSION  09/24/2012  ? Procedure: TRANSESOPHAGEAL ECHOCARDIOGRAM (TEE);  Surgeon: Fay Records, MD;  Location: [facility];  Service: Cardiovascular;  Laterality: N/A;  ? ? ?Current Outpatient Medications  ?Medication Sig Dispense Refill  ? acetaminophen (TYLENOL) 500 MG tablet Take 1,000 mg by mouth every 6 (six) hours as needed (back/neck pain). For pain    ? amiodarone (PACERONE) 200 MG tablet Take 200 mg by mouth Monday through Friday Do not take on the weekend( Saturday and Sunday)    ? aspirin EC 81 MG tablet Take 81 mg by mouth every other day. Swallow whole.    ? aspirin-acetaminophen-caffeine (EXCEDRIN MIGRAINE) 250-250-65 MG per tablet Take 1 tablet by mouth every 6 (six) hours as needed for headache.    ? Aspirin-Salicylamide-Caffeine (BC HEADACHE POWDER PO) Take 1 Package by mouth 2 (two) times daily as needed (headache/pain).    ? carvedilol (COREG) 3.125 MG tablet Take 1 tablet (3.125 mg total) by mouth 2 (two) times daily with a meal. 60 tablet 2  ? Cholecalciferol (VITAMIN D3) 125 MCG (5000 UT) CAPS Take 1 capsule (5,000 Units total) by mouth daily with breakfast. Take along with calcium and magnesium. 30 capsule 2  ? ENTRESTO 97-103 MG Take 1 tablet by mouth 2 (two) times daily.    ? gabapentin (NEURONTIN) 100 MG capsule 100 mg 2 (two) times daily.    ? Glycerin-Hypromellose-PEG 400 (VISINE DRY EYE) 0.2-0.2-1 % SOLN Place 1-2 drops into both eyes 3 (three) times daily as needed (dry/irritated eyes).    ? levothyroxine (SYNTHROID) 100 MCG tablet Take 100 mcg by mouth daily before breakfast.    ? Multiple Vitamin (MULTIVITAMIN WITH MINERALS) TABS tablet Take 1 tablet by mouth  daily. 100 tablet 0  ? ondansetron (ZOFRAN) 4 MG tablet Take 1 tablet (4 mg total) by mouth daily as needed for nausea or vomiting. 12 tablet 1  ? PARoxetine (PAXIL) 30 MG tablet Take 60 mg by mouth every evening.    ? Polyvinyl Alcohol-Povidone (MURINE TEARS FOR DRY EYES OP) Place 2 drops into both eyes 2 (two) times daily as needed. For dry eyes    ? pravastatin (PRAVACHOL) 40 MG tablet TAKE 1 TABLET BY MOUTH EVERY EVENING. 90 tablet 3  ? sodium chloride (OCEAN) 0.65 % SOLN nasal spray Place 1 spray into both nostrils as needed (dry nasal passages).    ? ?No current facility-administered medications for this visit.  ? ? ?Allergies:   Morphine and related, Codeine, Demerol [meperidine hcl], Meperidine hcl, and Pollen extract  ? ?Social History:  The patient  reports that she has never smoked. She has never used smokeless tobacco. She reports that she does not drink alcohol and does not use drugs.  ? ?Family History:  The patient's family history includes Arthritis in her mother; CAD in her mother and sister; CVA in her sister; Cancer - Other in her mother; Heart disease in her father; Seizures in her brother. ? ?ROS:  Please see the history of present illness.    ?All other systems are reviewed and otherwise negative.  ? ?PHYSICAL EXAM:  ?VS:  BP 112/72   Pulse 91   Ht 5\' 4"  (1.626 m)   Wt 158 lb (71.7 kg)   SpO2 97%   BMI 27.12 kg/m?  BMI: Body mass index is 27.12 kg/m?. ?Well nourished, well developed, in no acute distress ?HEENT: normocephalic, atraumatic ?Neck: no JVD, carotid bruits or masses ?Cardiac:  RRR; no significant murmurs, no rubs, or gallops ?Lungs:   CTA b/l, no wheezing, rhonchi or rales ?Abd: soft, nontender ?MS: no deformity or atrophy ?Ext: no edema ?Skin: warm and dry, no rash ?Neuro:  No gross deficits appreciated ?Psych: euthymic mood, full affect ? ?ICD site (R sided) is stable, no tethering or discomfort ? ? ?EKG:  Done today and reviewed by myself shows  ?SR 91bpm, QRS 152ms ? ?Device  interrogation done today and reviewed by myself:  ?Battery reached ERI 12/05/21 ?Lead measurements are good ?No arrhythmias ? ? ?01/21/21: TTE ?IMPRESSIONS  ? 1. Septal / lateral hypokinesis inferior akinesis . Left v

## 2022-01-31 ENCOUNTER — Encounter: Payer: Self-pay | Admitting: Physician Assistant

## 2022-01-31 ENCOUNTER — Encounter: Payer: Self-pay | Admitting: *Deleted

## 2022-01-31 ENCOUNTER — Ambulatory Visit (INDEPENDENT_AMBULATORY_CARE_PROVIDER_SITE_OTHER): Payer: Medicare Other | Admitting: Physician Assistant

## 2022-01-31 VITALS — BP 112/72 | HR 91 | Ht 64.0 in | Wt 158.0 lb

## 2022-01-31 DIAGNOSIS — I5022 Chronic systolic (congestive) heart failure: Secondary | ICD-10-CM

## 2022-01-31 DIAGNOSIS — Z4502 Encounter for adjustment and management of automatic implantable cardiac defibrillator: Secondary | ICD-10-CM

## 2022-01-31 DIAGNOSIS — Z01818 Encounter for other preprocedural examination: Secondary | ICD-10-CM

## 2022-01-31 DIAGNOSIS — I5021 Acute systolic (congestive) heart failure: Secondary | ICD-10-CM | POA: Diagnosis not present

## 2022-01-31 DIAGNOSIS — I469 Cardiac arrest, cause unspecified: Secondary | ICD-10-CM

## 2022-01-31 DIAGNOSIS — I428 Other cardiomyopathies: Secondary | ICD-10-CM

## 2022-01-31 NOTE — Patient Instructions (Addendum)
Medication Instructions:  ? ?Your physician recommends that you continue on your current medications as directed. Please refer to the Current Medication list given to you today. ? ?*If you need a refill on your cardiac medications before your next appointment, please call your pharmacy* ? ? ?Lab Work:  BMET AND CBC TODAY  ? ?If you have labs (blood work) drawn today and your tests are completely normal, you will receive your results only by: ?MyChart Message (if you have MyChart) OR ?A paper copy in the mail ?If you have any lab test that is abnormal or we need to change your treatment, we will call you to review the results. ? ? ?Testing/Procedures: SEE LETTER FOR BATTERY CHANGE ON 02-02-22 WITH DR Lovena Le  ? ? ? ?Follow-Up: ?At [facility], you and your health needs are our priority.  As part of our continuing mission to provide you with exceptional heart care, we have created designated Provider Care Teams.  These Care Teams include your primary Cardiologist (physician) and Advanced Practice Providers (APPs -  Physician Assistants and Nurse Practitioners) who all work together to provide you with the care you need, when you need it. ? ?We recommend signing up for the patient portal called "MyChart".  Sign up information is provided on this After Visit Summary.  MyChart is used to connect with patients for Virtual Visits (Telemedicine).  Patients are able to view lab/test results, encounter notes, upcoming appointments, etc.  Non-urgent messages can be sent to your provider as well.   ?To learn more about what you can do with MyChart, go to [email or web address].   ? ?Your next appointment:  AFTER 02-02-22  [place] VISIT WITH DEVICE CLINIC ? ?3 month(s) ? ?The format for your next appointment:   ?In Person ? ?Provider:   ?Cristopher Peru, MD [place]  ? ? ?Other Instructions ? ? ?Important Information About Sugar ? ? ? ? ?  ?

## 2022-02-01 ENCOUNTER — Telehealth: Payer: Self-pay | Admitting: *Deleted

## 2022-02-01 ENCOUNTER — Emergency Department
Admission: EM | Admit: 2022-02-01 | Discharge: 2022-02-01 | Disposition: A | Discharge status: Home / Self Care | Payer: Medicare Other | Attending: Emergency Medicine | Admitting: Emergency Medicine

## 2022-02-01 ENCOUNTER — Other Ambulatory Visit: Payer: Self-pay

## 2022-02-01 DIAGNOSIS — S51831D Puncture wound without foreign body of right forearm, subsequent encounter: Secondary | ICD-10-CM | POA: Diagnosis not present

## 2022-02-01 DIAGNOSIS — N1831 Chronic kidney disease, stage 3a: Secondary | ICD-10-CM | POA: Insufficient documentation

## 2022-02-01 DIAGNOSIS — K219 Gastro-esophageal reflux disease without esophagitis: Secondary | ICD-10-CM | POA: Diagnosis not present

## 2022-02-01 DIAGNOSIS — E875 Hyperkalemia: Secondary | ICD-10-CM | POA: Insufficient documentation

## 2022-02-01 DIAGNOSIS — E039 Hypothyroidism, unspecified: Secondary | ICD-10-CM | POA: Insufficient documentation

## 2022-02-01 DIAGNOSIS — L089 Local infection of the skin and subcutaneous tissue, unspecified: Secondary | ICD-10-CM | POA: Diagnosis not present

## 2022-02-01 DIAGNOSIS — Z4502 Encounter for adjustment and management of automatic implantable cardiac defibrillator: Secondary | ICD-10-CM | POA: Diagnosis not present

## 2022-02-01 DIAGNOSIS — I4891 Unspecified atrial fibrillation: Secondary | ICD-10-CM | POA: Insufficient documentation

## 2022-02-01 DIAGNOSIS — D631 Anemia in chronic kidney disease: Secondary | ICD-10-CM | POA: Diagnosis not present

## 2022-02-01 DIAGNOSIS — S82401D Unspecified fracture of shaft of right fibula, subsequent encounter for closed fracture with routine healing: Secondary | ICD-10-CM | POA: Diagnosis not present

## 2022-02-01 DIAGNOSIS — I5022 Chronic systolic (congestive) heart failure: Secondary | ICD-10-CM | POA: Diagnosis not present

## 2022-02-01 DIAGNOSIS — E785 Hyperlipidemia, unspecified: Secondary | ICD-10-CM | POA: Diagnosis not present

## 2022-02-01 DIAGNOSIS — I130 Hypertensive heart and chronic kidney disease with heart failure and stage 1 through stage 4 chronic kidney disease, or unspecified chronic kidney disease: Secondary | ICD-10-CM | POA: Insufficient documentation

## 2022-02-01 DIAGNOSIS — I509 Heart failure, unspecified: Secondary | ICD-10-CM | POA: Diagnosis not present

## 2022-02-01 DIAGNOSIS — I472 Ventricular tachycardia, unspecified: Secondary | ICD-10-CM | POA: Diagnosis not present

## 2022-02-01 DIAGNOSIS — I10 Essential (primary) hypertension: Secondary | ICD-10-CM | POA: Diagnosis not present

## 2022-02-01 DIAGNOSIS — G9341 Metabolic encephalopathy: Secondary | ICD-10-CM | POA: Diagnosis not present

## 2022-02-01 DIAGNOSIS — I428 Other cardiomyopathies: Secondary | ICD-10-CM | POA: Diagnosis not present

## 2022-02-01 DIAGNOSIS — D509 Iron deficiency anemia, unspecified: Secondary | ICD-10-CM | POA: Diagnosis not present

## 2022-02-01 LAB — COMPREHENSIVE METABOLIC PANEL
ALT: 11 U/L (ref 0–44)
AST: 26 U/L (ref 15–41)
Albumin: 4.3 g/dL (ref 3.5–5.0)
Alkaline Phosphatase: 58 U/L (ref 38–126)
Anion gap: 11 (ref 5–15)
BUN: 29 mg/dL — ABNORMAL HIGH (ref 8–23)
CO2: 20 mmol/L — ABNORMAL LOW (ref 22–32)
Calcium: 9.2 mg/dL (ref 8.9–10.3)
Chloride: 103 mmol/L (ref 98–111)
Creatinine, Ser: 1.63 mg/dL — ABNORMAL HIGH (ref 0.44–1.00)
GFR, Estimated: 33 mL/min — ABNORMAL LOW (ref >60)
Glucose, Bld: 81 mg/dL (ref 70–99)
Potassium: 5.7 mmol/L — ABNORMAL HIGH (ref 3.5–5.1)
Sodium: 134 mmol/L — ABNORMAL LOW (ref 135–145)
Total Bilirubin: 0.4 mg/dL (ref 0.3–1.2)
Total Protein: 7.1 g/dL (ref 6.5–8.1)

## 2022-02-01 LAB — CBC WITH DIFFERENTIAL/PLATELET
Abs Immature Granulocytes: 0.01 10*3/uL (ref 0.00–0.07)
Basophils Absolute: 0 10*3/uL (ref 0.0–0.1)
Basophils Relative: 1 %
Eosinophils Absolute: 0.2 10*3/uL (ref 0.0–0.5)
Eosinophils Relative: 4 %
HCT: 29.3 % — ABNORMAL LOW (ref 36.0–46.0)
Hemoglobin: 9.4 g/dL — ABNORMAL LOW (ref 12.0–15.0)
Immature Granulocytes: 0 %
Lymphocytes Relative: 25 %
Lymphs Abs: 1 10*3/uL (ref 0.7–4.0)
MCH: 32.9 pg (ref 26.0–34.0)
MCHC: 32.1 g/dL (ref 30.0–36.0)
MCV: 102.4 fL — ABNORMAL HIGH (ref 80.0–100.0)
Monocytes Absolute: 0.4 10*3/uL (ref 0.1–1.0)
Monocytes Relative: 9 %
Neutro Abs: 2.5 10*3/uL (ref 1.7–7.7)
Neutrophils Relative %: 61 %
Platelets: 242 10*3/uL (ref 150–400)
RBC: 2.86 MIL/uL — ABNORMAL LOW (ref 3.87–5.11)
RDW: 13.2 % (ref 11.5–15.5)
WBC: 4.1 10*3/uL (ref 4.0–10.5)
nRBC: 0 % (ref 0.0–0.2)

## 2022-02-01 MED ORDER — SODIUM ZIRCONIUM CYCLOSILICATE 10 G PO PACK
10.0000 g | PACK | Freq: Once | ORAL | Status: AC
  Administered 2022-02-01: 10 g via ORAL
  Filled 2022-02-01: qty 1

## 2022-02-01 MED ORDER — LOKELMA 10 G PO PACK: 10.0000 g | PACK | Freq: Three times a day (TID) | ORAL | 0 refills | Status: AC

## 2022-02-01 NOTE — Telephone Encounter (Signed)
-----   Message from [place], [place] sent at 02/01/2022  8:29 AM EDT ----- ?Potassium level is quite high, seems this has happened before.  Hold her entresto, and any  vitamins.  I don't see any prescribed potassium.  Needs a repeat level STAT this am or at ER. ?

## 2022-02-01 NOTE — Telephone Encounter (Signed)
Lvm on son  vm on cell number to contact back direct number  ?

## 2022-02-01 NOTE — Telephone Encounter (Signed)
Lvm for son to call clinic back direct number to dicuss lab results for mother and reccomendations ?

## 2022-02-01 NOTE — ED Provider Notes (Signed)
? ?[facility] ?Provider Note ? ? ? Event Date/Time  ? First MD Initiated Contact with Patient 02/01/22 1807   ?  (approximate) ? ? ?History  ? ?No chief complaint on file. ? ? ?HPI ? ?[name] is a 76 y.o. female with past medical history of CHF status post ICD, CKD, paroxysmal A-fib, hypothyroidism, hypertension who presents with hyperkalemia on outpatient labs.  Patient was seen by her cardiologist yesterday for routine visit.  She had labs drawn which showed a potassium of 6.1 so she was referred to the ED.  Was advised to stop her Entresto.  Patient is otherwise asymptomatic.  Denies chest pain shortness of breath nausea vomiting diarrhea.  Has been eating and drinking well. ?  ? ?Past Medical History:  ?Diagnosis Date  ? Anxiety   ? Arthritis   ? Automatic implantable cardioverter-defibrillator in situ   ? Cardiomegaly   ? CHF (congestive heart failure) ([place]) 05/2013  ? class III, severe  ? Chronic bronchitis ([place])   ? Depression   ? GERD (gastroesophageal reflux disease)   ? Headache(784.0)   ? migraines  ? Heart murmur   ? History of sudden cardiac arrest successfully resuscitated 2011  ? HLD (hyperlipidemia)   ? Hypertension   ? Hypothyroidism   ? IBS (irritable bowel syndrome)   ? Paroxysmal atrial fibrillation (HCC)   ? Pneumonia   ? Renal disorder   ? Scoliosis   ? Severe mitral regurgitation 05/2013  ? s/p MV repair with annuloplasty  ? ? ?Patient Active Problem List  ? Diagnosis Date Noted  ? Hyperkalemia 12/25/2021  ? Iron deficiency anemia 12/24/2021  ? Hyponatremia 12/23/2021  ? History of V- fib arrest   ? Acute renal failure superimposed on stage 3a chronic kidney disease ([place])   ? Metabolic acidosis, normal anion gap (NAG)   ? Closed fibular fracture 12/21/2021  ? Acute metabolic encephalopathy 40/34/7425  ? Need for post exposure prophylaxis for rabies 12/21/2021  ? Infected cat bite of forearm, right, initial encounter 12/21/2021  ? Vitamin D deficiency 12/06/2021  ?  Osteoarthritis of acromioclavicular joints (Bilateral) 12/06/2021  ? Osteoarthritis of glenohumeral joints (Bilateral) 12/06/2021  ? Osteoarthritis of shoulders (Bilateral) 12/06/2021  ? 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 Grade1 Anterolisthesis of lumbosacral spine (L5/S1) 12/06/2021  ? DDD (degenerative disc disease), lumbar 12/06/2021  ? Lumbar facet hypertrophy (L4-5, L5-S1) 12/06/2021  ? Osteopenia of lumbar spine 12/06/2021  ? Osteopenia determined by x-ray 12/06/2021  ? Osteoarthritis of hip (Left) 12/06/2021  ? Chronic pain syndrome 09/01/2021  ? Pharmacologic therapy 09/01/2021  ? Disorder of skeletal system 09/01/2021  ? Problems influencing health status 09/01/2021  ? Encounter for chronic pain management 09/01/2021  ? DDD (degenerative disc disease), cervical 09/01/2021  ? Cervical facet osteoarthritis (Bilateral) (R>L) 09/01/2021  ? Cervical facet arthropathy (Bilateral) (R>L) 09/01/2021  ? Cervical foraminal stenosis (Right) 09/01/2021  ? Other unilateral secondary osteoarthritis of knee (Right) 09/01/2021  ? 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 Osteoarthritis of knees (Bilateral) 09/01/2021  ? 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 Chronic low back pain (1ry area of Pain) (Bilateral) (R>L) w/o sciatica 09/01/2021  ? Chronic shoulder pain (6th area of Pain) (Bilateral) (R=L) 09/01/2021  ? Chronic upper back pain 09/01/2021  ? Chronic lower extremity pain (2ry area of Pain) (Bilateral) (R>L) 09/01/2021  ? Chronic hip pain (4th area of Pain) (Bilateral) (R>L) 09/01/2021  ? Chronic neck and back pain (  5th area of Pain) (Bilateral) (R>L) 09/01/2021  ? Cervicalgia 09/01/2021  ? Cervicogenic headache (7th area of Pain) 09/01/2021  ? Occipital headache 09/01/2021  ? Vertigo 09/01/2021  ? Balance problems 09/01/2021  ? At high risk for falls 09/01/2021  ? Ear pain, bilateral 09/01/2021  ? Cervical facet syndrome 09/01/2021  ? Acute CHF  (congestive heart failure) ([place]) 01/21/2021  ? Macrocytic anemia 01/21/2021  ? Toe injury, left, sequela 01/21/2021  ? Acute respiratory failure with hypoxia ([place]) 01/21/2021  ? Acute non-recurrent pansinusitis 10/03/2016  ? Chronic shoulder pain (Right) 06/21/2016  ? Chronic knee pain (3ry area of Pain) (Bilateral) 06/21/2016  ? Scoliosis 02/04/2015  ? Ventricular fibrillation ([place]) 11/03/2014  ? Anxiety state, unspecified 05/15/2013  ? Mitral regurgitation 09/20/2012  ? Chronic systolic CHF (congestive heart failure) ([place]) 04/20/2010  ? Automatic implantable cardioverter-defibrillator in situ 04/20/2010  ? Hypothyroidism 12/28/2009  ? HYPERLIPIDEMIA 12/28/2009  ? Essential hypertension 12/28/2009  ? CARDIAC ARRHYTHMIA 12/28/2009  ? CHF 12/28/2009  ? IBS 12/28/2009  ? ? ? ?Physical Exam  ?Triage Vital Signs: ?ED Triage Vitals  ?Enc Vitals Group  ?   BP 02/01/22 1718 (!) 140/101  ?   Pulse Rate 02/01/22 1718 91  ?   Resp 02/01/22 1718 18  ?   Temp 02/01/22 1718 98.3 ?F (36.8 ?C)  ?   Temp Source 02/01/22 1718 Oral  ?   SpO2 02/01/22 1718 95 %  ?   Weight --   ?   Height 02/01/22 1716 5\' 4"  (1.626 m)  ?   Head Circumference --   ?   Peak Flow --   ?   Pain Score 02/01/22 1716 0  ?   Pain Loc --   ?   Pain Edu? --   ?   Excl. in [place]? --   ? ? ?Most recent vital signs: ?Vitals:  ? 02/01/22 1833 02/01/22 1900  ?BP: 130/88 135/82  ?Pulse: 79 88  ?Resp: (!) 23 17  ?Temp:    ?SpO2: 98% 100%  ? ? ? ?General: Awake, no distress.  ?CV:  Good peripheral perfusion.  No edema, brace on the right ankle ?Resp:  Normal effort.  ?Abd:  No distention.  ?Neuro:             Awake, Alert, Oriented x 3  ?Other:   ? ? ?ED Results / Procedures / Treatments  ?Labs ?(all labs ordered are listed, but only abnormal results are displayed) ?Labs Reviewed  ?CBC WITH DIFFERENTIAL/PLATELET - Abnormal; Notable for the following components:  ?    Result Value  ? RBC 2.86 (*)   ? Hemoglobin 9.4 (*)   ? HCT 29.3 (*)   ? MCV 102.4 (*)   ? All other  components within normal limits  ?COMPREHENSIVE METABOLIC PANEL - Abnormal; Notable for the following components:  ? Sodium 134 (*)   ? Potassium 5.7 (*)   ? CO2 20 (*)   ? BUN 29 (*)   ? Creatinine, Ser 1.63 (*)   ? GFR, Estimated 33 (*)   ? All other components within normal limits  ? ? ? ?EKG ? ?EKG reviewed by myself,.  Difficult to discern P waves but do suspect this is normal sinus rhythm with normal axis normal intervals and a PVC looks very similar to EKG performed yesterday in the office ? ?RADIOLOGY ? ? ? ?PROCEDURES: ? ?Critical Care performed: No ? ?.1-3 Lead EKG Interpretation ?Performed by: Rada Hay, MD ?Authorized by:  Rada Hay, MD  ? ?  Interpretation: normal   ?  ECG rate assessment: normal   ?  Ectopy: none   ?  Conduction: normal   ? ?The patient is on the cardiac monitor to evaluate for evidence of arrhythmia and/or significant heart rate changes. ? ? ?MEDICATIONS ORDERED IN ED: ?Medications  ?sodium zirconium cyclosilicate (LOKELMA) packet 10 g (10 g Oral Given 02/01/22 1929)  ? ? ? ?IMPRESSION / MDM / ASSESSMENT AND PLAN / ED COURSE  ?I reviewed the triage vital signs and the nursing notes. ?             ?               ? ?Differential diagnosis includes, but is not limited to, medication side effect, worsening renal function, dietary intake ? ?The patient is a 76 year old female with past medical history of heart failure on Entresto who presents with hyperkalemia on outpatient labs.  Was 6.1 yesterday is 5.7 today.  I reviewed her EKG which is normal sinus rhythm without obvious signs of hyperkalemia.  Of note P waves are difficult to see, which could be a sign of P wave flattening however reviewed additional EKGs from when patient had potassium of 5 and they appear similar.  She otherwise is asymptomatic.  Her renal function is really unchanged.  Discussed with Dr. Candiss Norse with nephrology who agrees is reasonable to give her a potassium binder in the ED and advised her to  avoid juices potatoes/chips.  Of note patient eats a large quantity of potato chips regularly.  Discussed this with the patient advise she follow-up.  Given Lokelma in ED. ? ?  ? ? ?FINAL CLINICAL IMPRESSION(S) /

## 2022-02-01 NOTE — ED Notes (Signed)
Lokelma requested from pharmacy.  ?

## 2022-02-01 NOTE — ED Triage Notes (Signed)
Patient to ER via [facility] with c/o abnormal labs. Reports having her labs drawn yesterday and her potassium was elevated at 6.1 Patient reports feeling well. Denies any symptoms.  ?

## 2022-02-01 NOTE — Pre-Procedure Instructions (Signed)
Attempted to call patient regarding procedure instructions.  Home # not in service.  Mobile # no answer. ?

## 2022-02-01 NOTE — Telephone Encounter (Signed)
Lvm  vm on cell number to contact back direct number  home number is unavailable to leave messages ?

## 2022-02-01 NOTE — Discharge Instructions (Signed)
Your potassium was 5.7 today.  Please start taking the Lokelma 10 g 3 times a day for the next 2 days.  Please follow-up with your cardiologist or the kidney doctor to have a repeat blood work done in 2 days.  Please continue to hold your Entresto.  Please avoid potatoes, potato chips, juices and fruits. ?

## 2022-02-01 NOTE — Telephone Encounter (Signed)
Spoke with Dtr n law Margreta Journey who got message from son whose  contact information is now added as other in contacts. She verbalized understanding of critical labs for Potassium levels being elevated. Patient Dtr in law noted that this has happened to her before. She stated with staying 25 minutes away she will not be able to come back to our lab here and she will be in the emergency room all night. Dtr was explained the importance of this matter and she stated she understood that. She stated she has been trying to get in touch with patient her self but is usually hard to get in touch with her due to her hearing. She stated one of her Dtr stays  with her and she goona to get in touch with her to let her know also.She stated she's gonna keep a eye on her symptoms if any ( fatigue, tiredness, lightheadedness ,chest pain or pass out ) she will get her to her the hospital at 9 am in the morning if not sooner. ?

## 2022-02-01 NOTE — Telephone Encounter (Signed)
Lvm 2 vm on cell number to contact back direct number  home number is unavailable to leave messages ?

## 2022-02-02 ENCOUNTER — Other Ambulatory Visit: Payer: Self-pay

## 2022-02-02 ENCOUNTER — Encounter (HOSPITAL_COMMUNITY)
Admission: RE | Disposition: A | Discharge status: Home / Self Care | Payer: Self-pay | Source: Home / Self Care | Attending: Internal Medicine

## 2022-02-02 ENCOUNTER — Ambulatory Visit (HOSPITAL_COMMUNITY)
Admission: RE | Admit: 2022-02-02 | Discharge: 2022-02-02 | Disposition: A | Discharge status: Home / Self Care | Payer: Medicare Other | Attending: Internal Medicine | Admitting: Internal Medicine

## 2022-02-02 ENCOUNTER — Telehealth: Payer: Self-pay | Admitting: Internal Medicine

## 2022-02-02 DIAGNOSIS — I472 Ventricular tachycardia, unspecified: Secondary | ICD-10-CM | POA: Insufficient documentation

## 2022-02-02 DIAGNOSIS — K219 Gastro-esophageal reflux disease without esophagitis: Secondary | ICD-10-CM | POA: Diagnosis not present

## 2022-02-02 DIAGNOSIS — E785 Hyperlipidemia, unspecified: Secondary | ICD-10-CM | POA: Diagnosis not present

## 2022-02-02 DIAGNOSIS — I130 Hypertensive heart and chronic kidney disease with heart failure and stage 1 through stage 4 chronic kidney disease, or unspecified chronic kidney disease: Secondary | ICD-10-CM | POA: Diagnosis not present

## 2022-02-02 DIAGNOSIS — N1831 Chronic kidney disease, stage 3a: Secondary | ICD-10-CM | POA: Diagnosis not present

## 2022-02-02 DIAGNOSIS — Z4502 Encounter for adjustment and management of automatic implantable cardiac defibrillator: Secondary | ICD-10-CM | POA: Diagnosis not present

## 2022-02-02 DIAGNOSIS — I4901 Ventricular fibrillation: Secondary | ICD-10-CM | POA: Diagnosis not present

## 2022-02-02 DIAGNOSIS — I5022 Chronic systolic (congestive) heart failure: Secondary | ICD-10-CM | POA: Diagnosis not present

## 2022-02-02 DIAGNOSIS — Z01818 Encounter for other preprocedural examination: Secondary | ICD-10-CM

## 2022-02-02 DIAGNOSIS — E039 Hypothyroidism, unspecified: Secondary | ICD-10-CM | POA: Insufficient documentation

## 2022-02-02 DIAGNOSIS — I428 Other cardiomyopathies: Secondary | ICD-10-CM | POA: Diagnosis not present

## 2022-02-02 HISTORY — PX: ICD GENERATOR CHANGEOUT: EP1231

## 2022-02-02 LAB — CBC
Hematocrit: 29.1 % — ABNORMAL LOW (ref 34.0–46.6)
Hemoglobin: 9.6 g/dL — ABNORMAL LOW (ref 11.1–15.9)
MCH: 33.4 pg — ABNORMAL HIGH (ref 26.6–33.0)
MCHC: 33 g/dL (ref 31.5–35.7)
MCV: 101 fL — ABNORMAL HIGH (ref 79–97)
Platelets: 244 10*3/uL (ref 150–450)
RBC: 2.87 x10E6/uL — ABNORMAL LOW (ref 3.77–5.28)
RDW: 12.8 % (ref 11.7–15.4)
WBC: 6 10*3/uL (ref 3.4–10.8)

## 2022-02-02 LAB — BASIC METABOLIC PANEL
Anion gap: 11 (ref 5–15)
BUN/Creatinine Ratio: 14 (ref 12–28)
BUN: 23 mg/dL (ref 8–27)
BUN: 24 mg/dL — ABNORMAL HIGH (ref 8–23)
CO2: 18 mmol/L — ABNORMAL LOW (ref 20–29)
CO2: 18 mmol/L — ABNORMAL LOW (ref 22–32)
Calcium: 9.7 mg/dL (ref 8.7–10.3)
Calcium: 9.4 mg/dL (ref 8.9–10.3)
Chloride: 102 mmol/L (ref 96–106)
Chloride: 106 mmol/L (ref 98–111)
Creatinine, Ser: 1.7 mg/dL — ABNORMAL HIGH (ref 0.57–1.00)
Creatinine, Ser: 1.74 mg/dL — ABNORMAL HIGH (ref 0.44–1.00)
GFR, Estimated: 30 mL/min — ABNORMAL LOW (ref >60)
Glucose, Bld: 89 mg/dL (ref 70–99)
Glucose: 84 mg/dL (ref 70–99)
Potassium: 6.1 mmol/L (ref 3.5–5.2)
Potassium: 4.5 mmol/L (ref 3.5–5.1)
Sodium: 136 mmol/L (ref 134–144)
Sodium: 135 mmol/L (ref 135–145)
eGFR: 31 mL/min/{1.73_m2} — ABNORMAL LOW (ref >59)

## 2022-02-02 SURGERY — ICD GENERATOR CHANGEOUT

## 2022-02-02 MED ORDER — FENTANYL CITRATE (PF) 100 MCG/2ML IJ SOLN
INTRAMUSCULAR | Status: AC
  Filled 2022-02-02: qty 2

## 2022-02-02 MED ORDER — LIDOCAINE HCL (PF) 1 % IJ SOLN
INTRAMUSCULAR | Status: DC | PRN
Start: 2022-02-02 — End: 2022-02-02
  Administered 2022-02-02: 60 mL

## 2022-02-02 MED ORDER — SODIUM CHLORIDE 0.9 % IV SOLN: INTRAVENOUS | Status: DC

## 2022-02-02 MED ORDER — MIDAZOLAM HCL 5 MG/5ML IJ SOLN
INTRAMUSCULAR | Status: AC
  Filled 2022-02-02: qty 5

## 2022-02-02 MED ORDER — FENTANYL CITRATE (PF) 100 MCG/2ML IJ SOLN
INTRAMUSCULAR | Status: DC | PRN
  Administered 2022-02-02 (×2): 25 ug via INTRAVENOUS

## 2022-02-02 MED ORDER — CEFAZOLIN SODIUM-DEXTROSE 2-4 GM/100ML-% IV SOLN
2.0000 g | INTRAVENOUS | Status: AC
  Administered 2022-02-02: 2 g via INTRAVENOUS

## 2022-02-02 MED ORDER — SACUBITRIL-VALSARTAN 49-51 MG PO TABS: 1.0000 | ORAL_TABLET | Freq: Two times a day (BID) | ORAL | Status: DC

## 2022-02-02 MED ORDER — CEFAZOLIN SODIUM-DEXTROSE 2-4 GM/100ML-% IV SOLN
INTRAVENOUS | Status: AC
  Filled 2022-02-02: qty 100

## 2022-02-02 MED ORDER — CHLORHEXIDINE GLUCONATE 4 % EX LIQD: 4.0000 "application " | Freq: Once | CUTANEOUS | Status: DC

## 2022-02-02 MED ORDER — GENTAMICIN SULFATE 40 MG/ML IJ SOLN
INTRAMUSCULAR | Status: AC
  Filled 2022-02-02: qty 2

## 2022-02-02 MED ORDER — ONDANSETRON HCL 4 MG/2ML IJ SOLN: 4.0000 mg | Freq: Four times a day (QID) | INTRAMUSCULAR | Status: DC | PRN

## 2022-02-02 MED ORDER — SODIUM CHLORIDE 0.9 % IV SOLN
80.0000 mg | INTRAVENOUS | Status: AC
  Administered 2022-02-02: 80 mg

## 2022-02-02 MED ORDER — LIDOCAINE HCL 1 % IJ SOLN
INTRAMUSCULAR | Status: AC
Start: 2022-02-02 — End: ?
  Filled 2022-02-02: qty 60

## 2022-02-02 MED ORDER — ACETAMINOPHEN 325 MG PO TABS: 325.0000 mg | ORAL_TABLET | ORAL | Status: DC | PRN

## 2022-02-02 MED ORDER — SACUBITRIL-VALSARTAN 49-51 MG PO TABS
1.0000 | ORAL_TABLET | Freq: Two times a day (BID) | ORAL | 6 refills | Status: DC
Start: 2022-02-03 — End: 2022-10-24

## 2022-02-02 MED ORDER — MIDAZOLAM HCL 5 MG/5ML IJ SOLN
INTRAMUSCULAR | Status: DC | PRN
  Administered 2022-02-02 (×2): 2 mg via INTRAVENOUS

## 2022-02-02 SURGICAL SUPPLY — 4 items
CABLE SURGICAL S-101-97-12 (CABLE) ×2 IMPLANT
ICD ELLIPSE VR CD1411-36C (ICD Generator) ×1 IMPLANT
PAD DEFIB RADIO PHYSIO CONN (PAD) ×2 IMPLANT
TRAY PACEMAKER INSERTION (PACKS) ×2 IMPLANT

## 2022-02-02 NOTE — Telephone Encounter (Signed)
[facility] reporting critical results. Call transferred to Triage. ?

## 2022-02-02 NOTE — Telephone Encounter (Signed)
I spoke with Summer from [place].  She is calling to report potassium of 6.1 on 01/31/22.  This has been addressed.  See lab result note.  Patient has since had additional lab work and last potassium was 5.7 ?

## 2022-02-02 NOTE — Discharge Instructions (Signed)

## 2022-02-02 NOTE — H&P (Signed)
?  ?Cardiology Office Note ?Date:  01/31/2022  ?Patient ID:  [name], DOB [birth year], MRN [id number] ?PCP:  Leonides Sake, MD  ?Cardiologist:  Dr. Terrence Dupont ?Electrophysiologist: Dr. Lovena Le ?  ?  ?Chief Complaint:  ERI ?  ?History of Present Illness: ?[name] is a 76 y.o. female with history of VF arrest , NICM, > ICD, VHD (s/p MVRepair 2014), HTN, HLD, hypothyroidism, GERD, CKD (IIIa) ?  ?She had a brief HF hospitalization April 2022, managed with Dr. Terrence Dupont ?  ?She comes in today to be seen for Dr. Lovena Le, last seen by him Sept 2022, she was doing OK< ne,ring ERI ?  ?She was hospitalized 12/21/21 - 12/25/21 after a fall suffered R ankle tib/fib fracture, no syncope reported ?She was also noted to have R forearm cellulitis after a cat bite, treated/planned 7 day antibiotic treatment and completion of rabies vaccination regime. ?Ortho for out patient follow up ?Not felt to be volume OL, her aldactone held with AKI ?  ?Device clinic noted that she reached ERI 12/05/21 ?  ?TODAY ?She is accompanied by her daughter in law. ?She is doing well ?Still wearing a walking orthopedic boot and ongoing ankle pain, though reportedly ankle is healed. ?Her arm also well healed up. ?She denies any CP, palpitations or cardiac awareness. ?No near syncope or syncope. ?Denies any SOB ?  ?She follows regularly with Dr. Terrence Dupont ?  ?  ?Device information ?[employer] single chamber ICD implanted 09/09/2009 ?Secondary prevention ?  ?Amiodarone goes back to 2014 ?  ?  ?    ?Past Medical History:  ?Diagnosis Date  ? Anxiety    ? Arthritis    ? Automatic implantable cardioverter-defibrillator in situ    ? Cardiomegaly    ? CHF (congestive heart failure) ([place]) 05/2013  ?  class III, severe  ? Chronic bronchitis ([place])    ? Depression    ? GERD (gastroesophageal reflux disease)    ? Headache(784.0)    ?  migraines  ? Heart murmur    ? History of sudden cardiac arrest successfully resuscitated 2011  ? HLD (hyperlipidemia)    ? Hypertension    ?  Hypothyroidism    ? IBS (irritable bowel syndrome)    ? Paroxysmal atrial fibrillation (HCC)    ? Pneumonia    ? Renal disorder    ? Scoliosis    ? Severe mitral regurgitation 05/2013  ?  s/p MV repair with annuloplasty  ?  ?  ?     ?Past Surgical History:  ?Procedure Laterality Date  ? ABDOMINAL HYSTERECTOMY      ? APPENDECTOMY      ? CARDIAC CATHETERIZATION      ? CARDIAC DEFIBRILLATOR PLACEMENT   2010  ?  Vfib arrest, single chamber ICD  ? CARPAL TUNNEL RELEASE Right 03/05/2014  ?  Procedure: RIGHT CARPAL TUNNEL RELEASE;  Surgeon: Schuyler Amor, MD;  Location: [facility];  Service: Orthopedics;  Laterality: Right;  ? CATARACT EXTRACTION W/PHACO Left 09/03/2020  ?  Procedure: CATARACT EXTRACTION PHACO AND INTRAOCULAR LENS PLACEMENT ([place]) LEFT;  Surgeon: Birder Robson, MD;  Location: [facility];  Service: Ophthalmology;  Laterality: Left;  Lot # [id number] ?[place]: 01:04.1 ?CDE:11:62 ?   ? CATARACT EXTRACTION W/PHACO Right 12/31/2020  ?  Procedure: CATARACT EXTRACTION PHACO AND INTRAOCULAR LENS PLACEMENT ([place]) RIGHT;  Surgeon: Birder Robson, MD;  Location: [facility];  Service: Ophthalmology;  Laterality: Right;  Lot #[id number] ?[place]: 00:55.8 ?CDE: 6.29  ? COLONOSCOPY      ?  FRACTURE SURGERY      ? HERNIA REPAIR   as child  ?  x 2  ? INTRAOPERATIVE TRANSESOPHAGEAL ECHOCARDIOGRAM N/A 05/24/2013  ?  Procedure: INTRAOPERATIVE TRANSESOPHAGEAL ECHOCARDIOGRAM;  Surgeon: Ivin Poot, MD;  Location: [facility];  Service: Open Heart Surgery;  Laterality: N/A;  ? LEFT HEART CATHETERIZATION WITH CORONARY ANGIOGRAM N/A 05/16/2013  ?  Procedure: LEFT HEART CATHETERIZATION WITH CORONARY ANGIOGRAM;  Surgeon: Clent Demark, MD;  [facility]: [facility] C[facility];  Service: Cardiovascular;  Laterality: N/A;  ? MITRAL VALVE REPAIR N/A 05/24/2013  ?  Surgeon: Ivin Poot, MD; Service: Open Heart Surgery  ? MULTIPLE EXTRACTIONS WITH ALVEOLOPLASTY N/A 05/21/2013  ?  Procedure: Extraction of tooth #'s 2,4,18 with alveoloplasty and gross debridement of  remaining teeth;  Surgeon: Lenn Cal, DDS;  Location: [facility];  Service: Oral Surgery;  Laterality: N/A;  ? OPEN REDUCTION INTERNAL FIXATION (ORIF) DISTAL RADIAL FRACTURE Right 03/05/2014  ?  Procedure: OPEN REDUCTION INTERNAL FIXATION (ORIF) RIGHT DISTAL RADIUS FRACTURE ;  Surgeon: Schuyler Amor, MD;  Location: [facility];  Service: Orthopedics;  Laterality: Right;  ? TEE WITHOUT CARDIOVERSION   09/24/2012  ?  Procedure: TRANSESOPHAGEAL ECHOCARDIOGRAM (TEE);  Surgeon: Fay Records, MD;  Location: [facility];  Service: Cardiovascular;  Laterality: N/A;  ?  ?  ?      ?Current Outpatient Medications  ?Medication Sig Dispense Refill  ? acetaminophen (TYLENOL) 500 MG tablet Take 1,000 mg by mouth every 6 (six) hours as needed (back/neck pain). For pain      ? amiodarone (PACERONE) 200 MG tablet Take 200 mg by mouth Monday through Friday Do not take on the weekend( Saturday and Sunday)      ? aspirin EC 81 MG tablet Take 81 mg by mouth every other day. Swallow whole.      ? aspirin-acetaminophen-caffeine (EXCEDRIN MIGRAINE) 250-250-65 MG per tablet Take 1 tablet by mouth every 6 (six) hours as needed for headache.      ? Aspirin-Salicylamide-Caffeine (BC HEADACHE POWDER PO) Take 1 Package by mouth 2 (two) times daily as needed (headache/pain).      ? carvedilol (COREG) 3.125 MG tablet Take 1 tablet (3.125 mg total) by mouth 2 (two) times daily with a meal. 60 tablet 2  ? Cholecalciferol (VITAMIN D3) 125 MCG (5000 UT) CAPS Take 1 capsule (5,000 Units total) by mouth daily with breakfast. Take along with calcium and magnesium. 30 capsule 2  ? ENTRESTO 97-103 MG Take 1 tablet by mouth 2 (two) times daily.      ? gabapentin (NEURONTIN) 100 MG capsule 100 mg 2 (two) times daily.      ? Glycerin-Hypromellose-PEG 400 (VISINE DRY EYE) 0.2-0.2-1 % SOLN Place 1-2 drops into both eyes 3 (three) times daily as needed (dry/irritated eyes).      ? levothyroxine (SYNTHROID) 100 MCG tablet Take 100 mcg by mouth daily before  breakfast.      ? Multiple Vitamin (MULTIVITAMIN WITH MINERALS) TABS tablet Take 1 tablet by mouth daily. 100 tablet 0  ? ondansetron (ZOFRAN) 4 MG tablet Take 1 tablet (4 mg total) by mouth daily as needed for nausea or vomiting. 12 tablet 1  ? PARoxetine (PAXIL) 30 MG tablet Take 60 mg by mouth every evening.      ? Polyvinyl Alcohol-Povidone (MURINE TEARS FOR DRY EYES OP) Place 2 drops into both eyes 2 (two) times daily as needed. For dry eyes      ? pravastatin (PRAVACHOL) 40 MG  tablet TAKE 1 TABLET BY MOUTH EVERY EVENING. 90 tablet 3  ? sodium chloride (OCEAN) 0.65 % SOLN nasal spray Place 1 spray into both nostrils as needed (dry nasal passages).      ?  ?No current facility-administered medications for this visit.  ?  ?  ?Allergies:   Morphine and related, Codeine, Demerol [meperidine hcl], Meperidine hcl, and Pollen extract  ?  ?Social History:  The patient  reports that she has never smoked. She has never used smokeless tobacco. She reports that she does not drink alcohol and does not use drugs.  ?  ?Family History:  The patient's family history includes Arthritis in her mother; CAD in her mother and sister; CVA in her sister; Cancer - Other in her mother; Heart disease in her father; Seizures in her brother. ?  ?ROS:  Please see the history of present illness.    ?All other systems are reviewed and otherwise negative.  ?  ?PHYSICAL EXAM:  ?VS:  BP 112/72   Pulse 91   Ht 5\' 4"  (1.626 m)   Wt 158 lb (71.7 kg)   SpO2 97%   BMI 27.12 kg/m?  BMI: Body mass index is 27.12 kg/m?. ?Well nourished, well developed, in no acute distress ?HEENT: normocephalic, atraumatic ?Neck: no JVD, carotid bruits or masses ?Cardiac:  RRR; no significant murmurs, no rubs, or gallops ?Lungs:   CTA b/l, no wheezing, rhonchi or rales ?Abd: soft, nontender ?MS: no deformity or atrophy ?Ext: no edema ?Skin: warm and dry, no rash ?Neuro:  No gross deficits appreciated ?Psych: euthymic mood, full affect ?  ?ICD site (R sided) is  stable, no tethering or discomfort ?  ?  ?EKG:  Done today and reviewed by myself shows  ?SR 91bpm, QRS 160ms ?  ?Device interrogation done today and reviewed by myself:  ?Battery reached ERI 12/05/21 ?Lead measurements

## 2022-02-03 ENCOUNTER — Encounter (HOSPITAL_COMMUNITY): Payer: Self-pay | Admitting: Internal Medicine

## 2022-02-17 ENCOUNTER — Ambulatory Visit (INDEPENDENT_AMBULATORY_CARE_PROVIDER_SITE_OTHER): Payer: Medicare Other

## 2022-02-17 DIAGNOSIS — I480 Paroxysmal atrial fibrillation: Secondary | ICD-10-CM | POA: Diagnosis not present

## 2022-02-17 NOTE — Patient Instructions (Signed)
? ?  After Your ICD ?(Implantable Cardiac Defibrillator) ? ? ? ?Monitor your defibrillator site for redness, swelling, and drainage. Call the device clinic at [phone] if you experience these symptoms or fever/chills. If the swelling grows beyond the margins marked today please notify the device clinic.  ? ?Do not lift, push or pull greater than 10 pounds with the affected arm until 6 weeks after your procedure. There are no other restrictions in arm movement after your wound check appointment. ? ?Your ICD is MRI compatible. ? ?Your ICD is designed to protect you from life threatening heart rhythms. Because of this, you may receive a shock.  ? ?1 shock with no symptoms:  Call the office during business hours. ?1 shock with symptoms (chest pain, chest pressure, dizziness, lightheadedness, shortness of breath, overall feeling unwell):  Call 911. ?If you experience 2 or more shocks in 24 hours:  Call 911. ?If you receive a shock, you should not drive.  ?[place] DMV - no driving for 6 months if you receive appropriate therapy from your ICD.  ? ?ICD Alerts:  Some alerts are vibratory and others beep. These are NOT emergencies. Please call our office to let us know. If this occurs at night or on weekends, it can wait until the next business day. Send a remote transmission. ? ?If your device is capable of reading fluid status (for heart failure), you will be offered monthly monitoring to review this with you.  ? ?Remote monitoring is used to monitor your ICD from home. This monitoring is scheduled every 91 days by our office. It allows [place] to keep an eye on the functioning of your device to ensure it is working properly. You will routinely see your Electrophysiologist annually (more often if necessary).  ?

## 2022-02-18 LAB — CUP PACEART INCLINIC DEVICE CHECK
Battery Remaining Longevity: 92 mo
Brady Statistic RV Percent Paced: 0 %
Date Time Interrogation Session: 20230504084600
HighPow Impedance: 30.9938
Implantable Lead Implant Date: 20101124
Implantable Lead Location: 753860
Implantable Pulse Generator Implant Date: 20230419
Lead Channel Impedance Value: 325 Ohm
Lead Channel Pacing Threshold Amplitude: 1 V
Lead Channel Pacing Threshold Pulse Width: 0.5 ms
Lead Channel Sensing Intrinsic Amplitude: 10 mV
Lead Channel Setting Pacing Amplitude: 2 V
Lead Channel Setting Pacing Pulse Width: 0.5 ms
Lead Channel Setting Sensing Sensitivity: 0.5 mV
Pulse Gen Serial Number: 9995581

## 2022-02-18 NOTE — Progress Notes (Signed)
Wound check appointment s/p single lead ICD gen change. Steri-strips removed. Wound without redness. Incision edges approximated, wound well healed. Hematoma present and assessed by WC. No bloodthinners prescribed. Margins marked. Irregular V-V intervals noted. Patient c/o more shortness of breath with exertion. EKG obtained. AF new onset noted with predominantly controlled V rates. Patient will return to see GT Monday to assess hematoma and new onset AF. Per RU, PA note 01/31/22 patient has a remote history per EMR however patient was unaware when asked about AF history. Normal device function. Thresholds, sensing, and impedances consistent with implant measurements. Device programmed for chronic lead settings. Histogram distribution appropriate for patient and level of activity. No ventricular arrhythmias noted. Patient educated about wound care, arm mobility, lifting restrictions, shock plan. Patient prefers to not enroll in remote monitoring and will continue to be checked in clinic.  ? ? ? ?

## 2022-02-21 ENCOUNTER — Encounter: Payer: Self-pay | Admitting: Internal Medicine

## 2022-02-21 ENCOUNTER — Ambulatory Visit (INDEPENDENT_AMBULATORY_CARE_PROVIDER_SITE_OTHER): Payer: Medicare Other | Admitting: Internal Medicine

## 2022-02-21 VITALS — BP 126/72 | HR 98 | Ht 64.0 in | Wt 159.8 lb

## 2022-02-21 DIAGNOSIS — Z95810 Presence of automatic (implantable) cardiac defibrillator: Secondary | ICD-10-CM | POA: Diagnosis not present

## 2022-02-21 DIAGNOSIS — I4901 Ventricular fibrillation: Secondary | ICD-10-CM | POA: Diagnosis not present

## 2022-02-21 DIAGNOSIS — I5022 Chronic systolic (congestive) heart failure: Secondary | ICD-10-CM | POA: Diagnosis not present

## 2022-02-21 MED ORDER — AMIODARONE HCL 200 MG PO TABS: ORAL_TABLET | ORAL | 3 refills | Status: DC

## 2022-02-21 NOTE — Patient Instructions (Addendum)
Medication Instructions:  ?Your physician has recommended you make the following change in your medication:  ? ? INCREASE your amiodarone 200 mg-  Take one tablet by mouth Monday through Friday.  TAKE TWO tablets by mouth Saturday and Sunday. ? ?Labwork: ?None ordered. ? ?Testing/Procedures: ?None ordered. ? ?Follow-Up: ?Your physician wants you to follow-up in: 5-6 weeks with Cristopher Peru, MD  ? ?April 15, 2022 at 2:15 pm at the [facility] office ? ?Remote monitoring is used to monitor your ICD from home. This monitoring reduces the number of office visits required to check your device to one time per year. It allows [place] to keep an eye on the functioning of your device to ensure it is working properly. You are scheduled for a device check from home on 05/23/2022. You may send your transmission at any time that day. If you have a wireless device, the transmission will be sent automatically. After your physician reviews your transmission, you will receive a postcard with your next transmission date. ? ?Any Other Special Instructions Will Be Listed Below (If Applicable). ? ?If you need a refill on your cardiac medications before your next appointment, please call your pharmacy.  ? ?Important Information About Sugar ? ? ? ? ? ? ? ?

## 2022-02-21 NOTE — Progress Notes (Signed)
? ? ? ? ?HPI ?[name] returns today for followup. She is a pleasant 76 yo woman with a h/o chronic systolic heart failure, VF arrest s/p ICD insertion, mitral regurge, and chronic amio therapy for prevention of her ventricular arrhythmias. She underwent ICD gen change complicated by hematoma. She is sore. She also has developed atrial fib despite being on low dose amiodarone. No fever or chills.  ?Allergies  ?Allergen Reactions  ? Morphine And Related Itching and Nausea And Vomiting  ? Codeine Nausea And Vomiting  ? Demerol [Meperidine Hcl] Nausea And Vomiting  ? Meperidine Hcl Nausea And Vomiting  ? Pollen Extract Itching and Other (See Comments)  ?  Seasonal allergies  ? ? ? ?Current Outpatient Medications  ?Medication Sig Dispense Refill  ? acetaminophen (TYLENOL) 500 MG tablet Take 1,000 mg by mouth every 6 (six) hours as needed (back/neck pain). For pain    ? amiodarone (PACERONE) 200 MG tablet Take one tablet by mouth daily Monday through Saturday.  Take TWO tablets by mouth Saturday and Sunday. 114 tablet 3  ? aspirin EC 81 MG tablet Take 81 mg by mouth every other day. Swallow whole.    ? aspirin-acetaminophen-caffeine (EXCEDRIN MIGRAINE) 250-250-65 MG per tablet Take 1 tablet by mouth every 6 (six) hours as needed for headache.    ? Aspirin-Salicylamide-Caffeine (BC HEADACHE POWDER PO) Take 1 Package by mouth 2 (two) times daily as needed (headache/pain).    ? carvedilol (COREG) 3.125 MG tablet Take 1 tablet (3.125 mg total) by mouth 2 (two) times daily with a meal. 60 tablet 2  ? Cholecalciferol (VITAMIN D3) 125 MCG (5000 UT) CAPS Take 1 capsule (5,000 Units total) by mouth daily with breakfast. Take along with calcium and magnesium. 30 capsule 2  ? gabapentin (NEURONTIN) 100 MG capsule 100 mg 2 (two) times daily.    ? Glycerin-Hypromellose-PEG 400 (VISINE DRY EYE) 0.2-0.2-1 % SOLN Place 1-2 drops into both eyes 3 (three) times daily as needed (dry/irritated eyes).    ? levothyroxine (SYNTHROID) 100  MCG tablet Take 100 mcg by mouth daily before breakfast.    ? Multiple Vitamin (MULTIVITAMIN WITH MINERALS) TABS tablet Take 1 tablet by mouth daily. 100 tablet 0  ? ondansetron (ZOFRAN) 4 MG tablet Take 1 tablet (4 mg total) by mouth daily as needed for nausea or vomiting. 12 tablet 1  ? PARoxetine (PAXIL) 30 MG tablet Take 60 mg by mouth every evening.    ? Polyvinyl Alcohol-Povidone (MURINE TEARS FOR DRY EYES OP) Place 2 drops into both eyes 2 (two) times daily as needed. For dry eyes    ? pravastatin (PRAVACHOL) 40 MG tablet TAKE 1 TABLET BY MOUTH EVERY EVENING. 90 tablet 3  ? sacubitril-valsartan (ENTRESTO) 49-51 MG Take 1 tablet by mouth 2 (two) times daily. 60 tablet 6  ? sodium chloride (OCEAN) 0.65 % SOLN nasal spray Place 1 spray into both nostrils as needed (dry nasal passages).    ? ?No current facility-administered medications for this visit.  ? ? ? ?Past Medical History:  ?Diagnosis Date  ? Anxiety   ? Arthritis   ? Automatic implantable cardioverter-defibrillator in situ   ? Cardiomegaly   ? CHF (congestive heart failure) ([place]) 05/2013  ? class III, severe  ? Chronic bronchitis ([place])   ? Depression   ? GERD (gastroesophageal reflux disease)   ? Headache(784.0)   ? migraines  ? Heart murmur   ? History of sudden cardiac arrest successfully resuscitated 2011  ? HLD (hyperlipidemia)   ?  Hypertension   ? Hypothyroidism   ? IBS (irritable bowel syndrome)   ? Paroxysmal atrial fibrillation (HCC)   ? Pneumonia   ? Renal disorder   ? Scoliosis   ? Severe mitral regurgitation 05/2013  ? s/p MV repair with annuloplasty  ? ? ?ROS: ? ? All systems reviewed and negative except as noted in the HPI. ? ? ?Past Surgical History:  ?Procedure Laterality Date  ? ABDOMINAL HYSTERECTOMY    ? APPENDECTOMY    ? CARDIAC CATHETERIZATION    ? CARDIAC DEFIBRILLATOR PLACEMENT  2010  ? Vfib arrest, single chamber ICD  ? CARPAL TUNNEL RELEASE Right 03/05/2014  ? Procedure: RIGHT CARPAL TUNNEL RELEASE;  Surgeon: Schuyler Amor,  MD;  Location: [place];  Service: Orthopedics;  Laterality: Right;  ? CATARACT EXTRACTION W/PHACO Left 09/03/2020  ? Procedure: CATARACT EXTRACTION PHACO AND INTRAOCULAR LENS PLACEMENT ([place]) LEFT;  Surgeon: Birder Robson, MD;  Location: [facility];  Service: Ophthalmology;  Laterality: Left;  Lot # [id number] ?[place]: 01:04.1 ?CDE:11:62 ?  ? CATARACT EXTRACTION W/PHACO Right 12/31/2020  ? Procedure: CATARACT EXTRACTION PHACO AND INTRAOCULAR LENS PLACEMENT ([place]) RIGHT;  Surgeon: Birder Robson, MD;  Location: [facility];  Service: Ophthalmology;  Laterality: Right;  Lot #[id number] ?[place]: 00:55.8 ?CDE: 6.29  ? COLONOSCOPY    ? FRACTURE SURGERY    ? HERNIA REPAIR  as child  ? x 2  ? ICD GENERATOR CHANGEOUT N/A 02/02/2022  ? Procedure: ICD GENERATOR CHANGEOUT;  Surgeon: Evans Lance, MD;  Location: [place] [facility];  Service: Cardiovascular;  Laterality: N/A;  ? INTRAOPERATIVE TRANSESOPHAGEAL ECHOCARDIOGRAM N/A 05/24/2013  ? Procedure: INTRAOPERATIVE TRANSESOPHAGEAL ECHOCARDIOGRAM;  Surgeon: Ivin Poot, MD;  Location: [place];  Service: Open Heart Surgery;  Laterality: N/A;  ? LEFT HEART CATHETERIZATION WITH CORONARY ANGIOGRAM N/A 05/16/2013  ? Procedure: LEFT HEART CATHETERIZATION WITH CORONARY ANGIOGRAM;  Surgeon: Clent Demark, MD;  Location: [facility] CATH [facility];  Service: Cardiovascular;  Laterality: N/A;  ? MITRAL VALVE REPAIR N/A 05/24/2013  ? Surgeon: Ivin Poot, MD; Service: Open Heart Surgery  ? MULTIPLE EXTRACTIONS WITH ALVEOLOPLASTY N/A 05/21/2013  ? Procedure: Extraction of tooth #'s 2,4,18 with alveoloplasty and gross debridement of remaining teeth;  Surgeon: Lenn Cal, DDS;  Location: [place];  Service: Oral Surgery;  Laterality: N/A;  ? OPEN REDUCTION INTERNAL FIXATION (ORIF) DISTAL RADIAL FRACTURE Right 03/05/2014  ? Procedure: OPEN REDUCTION INTERNAL FIXATION (ORIF) RIGHT DISTAL RADIUS FRACTURE ;  Surgeon: Schuyler Amor, MD;  Location: [place];  Service: Orthopedics;  Laterality: Right;  ? TEE WITHOUT  CARDIOVERSION  09/24/2012  ? Procedure: TRANSESOPHAGEAL ECHOCARDIOGRAM (TEE);  Surgeon: Fay Records, MD;  Location: [place];  Service: Cardiovascular;  Laterality: N/A;  ? ? ? ?Family History  ?Problem Relation Age of Onset  ? Cancer - Other Mother   ? CAD Mother   ? Arthritis Mother   ? CVA Sister   ? CAD Sister   ? Seizures Brother   ? Heart disease Father   ? ? ? ?Social History  ? ?Socioeconomic History  ? Marital status: Widowed  ?  Spouse name: Not on file  ? Number of children: 1  ? Years of education: 69  ? Highest education level: Not on file  ?Occupational History  ? Occupation: Retired  ?Tobacco Use  ? Smoking status: Never  ? Smokeless tobacco: Never  ?Vaping Use  ? Vaping Use: Never used  ?Substance and Sexual Activity  ? Alcohol use: No  ?  Drug use: No  ? Sexual activity: Yes  ?  Birth control/protection: Surgical  ?Other Topics Concern  ? Not on file  ?Social History Narrative  ? Fun: Shop, read, dance  ? Denies religious beliefs effecting health care.   ? ?Social Determinants of Health  ? ?Financial Resource Strain: Not on file  ?Food Insecurity: Not on file  ?Transportation Needs: Not on file  ?Physical Activity: Not on file  ?Stress: Not on file  ?Social Connections: Not on file  ?Intimate Partner Violence: Not on file  ? ? ? ?BP 126/72   Pulse 98   Ht 5\' 4"  (1.626 m)   Wt 159 lb 12.8 oz (72.5 kg)   SpO2 95%   BMI 27.43 kg/m?  ? ?Physical Exam: ? ?Well appearing NAD ?HEENT: Unremarkable ?Neck:  No JVD, no thyromegally ?Lymphatics:  No adenopathy ?Back:  No CVA tenderness ?Lungs:  Clear  with no wheezes ?HEART:  Regular rate rhythm, no murmurs, no rubs, no clicks ?Abd:  soft, positive bowel sounds, no organomegally, no rebound, no guarding ?Ext:  2 plus pulses, no edema, no cyanosis, no clubbing ?Skin:  No rashes no nodules ?Neuro:  CN II through XII intact, motor grossly intact ? ?EKG - atrial fib with a controlled VR ? ?DEVICE  ?Normal device function.  See PaceArt for details.   ? ?Assess/Plan:  ?1. Atrial fib - this is a new problem. Her rate is reasonably well controlled. She will increase her dose of amiodarone and I will see her back in 5-6 weeks. I would anticipate starting anti-coag then

## 2022-02-22 DIAGNOSIS — M170 Bilateral primary osteoarthritis of knee: Secondary | ICD-10-CM | POA: Diagnosis not present

## 2022-02-24 ENCOUNTER — Ambulatory Visit: Payer: Medicare Other | Admitting: Pain Medicine

## 2022-02-24 DIAGNOSIS — S82401D Unspecified fracture of shaft of right fibula, subsequent encounter for closed fracture with routine healing: Secondary | ICD-10-CM | POA: Diagnosis not present

## 2022-02-24 DIAGNOSIS — D631 Anemia in chronic kidney disease: Secondary | ICD-10-CM | POA: Diagnosis not present

## 2022-02-24 DIAGNOSIS — I130 Hypertensive heart and chronic kidney disease with heart failure and stage 1 through stage 4 chronic kidney disease, or unspecified chronic kidney disease: Secondary | ICD-10-CM | POA: Diagnosis not present

## 2022-02-24 DIAGNOSIS — D509 Iron deficiency anemia, unspecified: Secondary | ICD-10-CM | POA: Diagnosis not present

## 2022-02-24 DIAGNOSIS — E039 Hypothyroidism, unspecified: Secondary | ICD-10-CM | POA: Diagnosis not present

## 2022-02-24 DIAGNOSIS — N1831 Chronic kidney disease, stage 3a: Secondary | ICD-10-CM | POA: Diagnosis not present

## 2022-02-24 DIAGNOSIS — G9341 Metabolic encephalopathy: Secondary | ICD-10-CM | POA: Diagnosis not present

## 2022-02-24 DIAGNOSIS — I5022 Chronic systolic (congestive) heart failure: Secondary | ICD-10-CM | POA: Diagnosis not present

## 2022-02-24 DIAGNOSIS — L089 Local infection of the skin and subcutaneous tissue, unspecified: Secondary | ICD-10-CM | POA: Diagnosis not present

## 2022-02-24 DIAGNOSIS — S51831D Puncture wound without foreign body of right forearm, subsequent encounter: Secondary | ICD-10-CM | POA: Diagnosis not present

## 2022-03-30 DIAGNOSIS — I420 Dilated cardiomyopathy: Secondary | ICD-10-CM | POA: Diagnosis not present

## 2022-03-30 DIAGNOSIS — I480 Paroxysmal atrial fibrillation: Secondary | ICD-10-CM | POA: Diagnosis not present

## 2022-03-30 DIAGNOSIS — I10 Essential (primary) hypertension: Secondary | ICD-10-CM | POA: Diagnosis not present

## 2022-03-30 DIAGNOSIS — I5020 Unspecified systolic (congestive) heart failure: Secondary | ICD-10-CM | POA: Diagnosis not present

## 2022-04-15 ENCOUNTER — Encounter: Payer: Self-pay | Admitting: Internal Medicine

## 2022-04-15 ENCOUNTER — Ambulatory Visit (INDEPENDENT_AMBULATORY_CARE_PROVIDER_SITE_OTHER): Payer: Medicare Other | Admitting: Internal Medicine

## 2022-04-15 VITALS — BP 130/90 | Ht 64.0 in | Wt 152.2 lb

## 2022-04-15 DIAGNOSIS — I480 Paroxysmal atrial fibrillation: Secondary | ICD-10-CM | POA: Diagnosis not present

## 2022-04-15 DIAGNOSIS — Z95810 Presence of automatic (implantable) cardiac defibrillator: Secondary | ICD-10-CM

## 2022-04-15 DIAGNOSIS — I5022 Chronic systolic (congestive) heart failure: Secondary | ICD-10-CM | POA: Diagnosis not present

## 2022-04-15 DIAGNOSIS — I4901 Ventricular fibrillation: Secondary | ICD-10-CM | POA: Diagnosis not present

## 2022-04-15 DIAGNOSIS — I4891 Unspecified atrial fibrillation: Secondary | ICD-10-CM | POA: Insufficient documentation

## 2022-04-15 NOTE — H&P (View-Only) (Signed)
HPI [name] returns today for followup. She is a pleasant 76 yo woman with a h/o chronic systolic heart failure, VF arrest s/p ICD insertion, mitral regurge, and chronic amio therapy for prevention of her ventricular arrhythmias. She underwent ICD gen change complicated by hematoma. She is sore. She also has developed atrial fib which has become permanent despite being on low dose amiodarone. No fever or chills.  Allergies  Allergen Reactions   Morphine And Related Itching and Nausea And Vomiting   Codeine Nausea And Vomiting   Demerol [Meperidine Hcl] Nausea And Vomiting   Meperidine Hcl Nausea And Vomiting   Pollen Extract Itching and Other (See Comments)    Seasonal allergies     Current Outpatient Medications  Medication Sig Dispense Refill   acetaminophen (TYLENOL) 500 MG tablet Take 1,000 mg by mouth every 6 (six) hours as needed (back/neck pain). For pain     amiodarone (PACERONE) 200 MG tablet Take one tablet by mouth daily Monday through Saturday.  Take TWO tablets by mouth Saturday and Sunday. 114 tablet 3   carvedilol (COREG) 3.125 MG tablet Take 1 tablet (3.125 mg total) by mouth 2 (two) times daily with a meal. 60 tablet 2   ELIQUIS 5 MG TABS tablet Take 5 mg by mouth 2 (two) times daily.     gabapentin (NEURONTIN) 100 MG capsule 100 mg 2 (two) times daily.     Glycerin-Hypromellose-PEG 400 (VISINE DRY EYE) 0.2-0.2-1 % SOLN Place 1-2 drops into both eyes 3 (three) times daily as needed (dry/irritated eyes).     levothyroxine (SYNTHROID) 100 MCG tablet Take 100 mcg by mouth daily before breakfast.     Multiple Vitamin (MULTIVITAMIN WITH MINERALS) TABS tablet Take 1 tablet by mouth daily. 100 tablet 0   ondansetron (ZOFRAN) 4 MG tablet Take 1 tablet (4 mg total) by mouth daily as needed for nausea or vomiting. 12 tablet 1   PARoxetine (PAXIL) 30 MG tablet Take 60 mg by mouth every evening.     Polyvinyl Alcohol-Povidone (MURINE TEARS FOR DRY EYES OP) Place 2 drops  into both eyes 2 (two) times daily as needed. For dry eyes     pravastatin (PRAVACHOL) 40 MG tablet TAKE 1 TABLET BY MOUTH EVERY EVENING. 90 tablet 3   sacubitril-valsartan (ENTRESTO) 49-51 MG Take 1 tablet by mouth 2 (two) times daily. 60 tablet 6   sodium chloride (OCEAN) 0.65 % SOLN nasal spray Place 1 spray into both nostrils as needed (dry nasal passages).     spironolactone (ALDACTONE) 25 MG tablet Take 12.5 mg by mouth daily.     No current facility-administered medications for this visit.     Past Medical History:  Diagnosis Date   Anxiety    Arthritis    Automatic implantable cardioverter-defibrillator in situ    Cardiomegaly    CHF (congestive heart failure) ([place]) 05/2013   class III, severe   Chronic bronchitis (HCC)    Depression    GERD (gastroesophageal reflux disease)    Headache(784.0)    migraines   Heart murmur    History of sudden cardiac arrest successfully resuscitated 2011   HLD (hyperlipidemia)    Hypertension    Hypothyroidism    IBS (irritable bowel syndrome)    Paroxysmal atrial fibrillation (HCC)    Pneumonia    Renal disorder    Scoliosis    Severe mitral regurgitation 05/2013   s/p MV repair with annuloplasty    ROS:   All systems  reviewed and negative except as noted in the HPI.   Past Surgical History:  Procedure Laterality Date   ABDOMINAL HYSTERECTOMY     APPENDECTOMY     CARDIAC CATHETERIZATION     CARDIAC DEFIBRILLATOR PLACEMENT  2010   Vfib arrest, single chamber ICD   CARPAL TUNNEL RELEASE Right 03/05/2014   Procedure: RIGHT CARPAL TUNNEL RELEASE;  Surgeon: Schuyler Amor, MD;  Location: [place];  Service: Orthopedics;  Laterality: Right;   CATARACT EXTRACTION W/PHACO Left 09/03/2020   Procedure: CATARACT EXTRACTION PHACO AND INTRAOCULAR LENS PLACEMENT ([place]) LEFT;  Surgeon: Birder Robson, MD;  Location: [facility];  Service: Ophthalmology;  Laterality: Left;  Lot # [id number] H [place]: 01:04.1 CDE:11:62    CATARACT EXTRACTION  W/PHACO Right 12/31/2020   Procedure: CATARACT EXTRACTION PHACO AND INTRAOCULAR LENS PLACEMENT ([place]) RIGHT;  Surgeon: Birder Robson, MD;  Location: [facility];  Service: Ophthalmology;  Laterality: Right;  Lot #[id number] H [place]: 00:55.8 CDE: 6.29   COLONOSCOPY     FRACTURE SURGERY     HERNIA REPAIR  as child   x 2   ICD GENERATOR CHANGEOUT N/A 02/02/2022   Procedure: [place];  Surgeon: Evans Lance, MD;  Location: [place] [facility];  Service: Cardiovascular;  Laterality: N/A;   INTRAOPERATIVE TRANSESOPHAGEAL ECHOCARDIOGRAM N/A 05/24/2013   Procedure: INTRAOPERATIVE TRANSESOPHAGEAL ECHOCARDIOGRAM;  Surgeon: Ivin Poot, MD;  Location: [place];  Service: Open Heart Surgery;  Laterality: N/A;   LEFT HEART CATHETERIZATION WITH CORONARY ANGIOGRAM N/A 05/16/2013   Procedure: LEFT HEART CATHETERIZATION WITH CORONARY ANGIOGRAM;  Surgeon: Clent Demark, MD;  Location: [place] [facility];  Service: Cardiovascular;  Laterality: N/A;   MITRAL VALVE REPAIR N/A 05/24/2013   Surgeon: Ivin Poot, MD; Service: Open Heart Surgery   MULTIPLE EXTRACTIONS WITH ALVEOLOPLASTY N/A 05/21/2013   Procedure: Extraction of tooth #'s 2,4,18 with alveoloplasty and gross debridement of remaining teeth;  Surgeon: Lenn Cal, DDS;  Location: [place];  Service: Oral Surgery;  Laterality: N/A;   OPEN REDUCTION INTERNAL FIXATION (ORIF) DISTAL RADIAL FRACTURE Right 03/05/2014   Procedure: OPEN REDUCTION INTERNAL FIXATION (ORIF) RIGHT DISTAL RADIUS FRACTURE ;  Surgeon: Schuyler Amor, MD;  Location: [place];  Service: Orthopedics;  Laterality: Right;   TEE WITHOUT CARDIOVERSION  09/24/2012   Procedure: TRANSESOPHAGEAL ECHOCARDIOGRAM (TEE);  Surgeon: Fay Records, MD;  Location: [facility] ENDOSCOPY;  Service: Cardiovascular;  Laterality: N/A;     Family History  Problem Relation Age of Onset   Cancer - Other Mother    CAD Mother    Arthritis Mother    CVA Sister    CAD Sister    Seizures Brother    Heart disease  Father      Social History   Socioeconomic History   Marital status: Widowed    Spouse name: Not on file   Number of children: 1   Years of education: 9   Highest education level: Not on file  Occupational History   Occupation: Retired  Tobacco Use   Smoking status: Never   Smokeless tobacco: Never  Vaping Use   Vaping Use: Never used  Substance and Sexual Activity   Alcohol use: No   Drug use: No   Sexual activity: Yes    Birth control/protection: Surgical  Other Topics Concern   Not on file  Social History Narrative   Fun: Shop, read, dance   Denies religious beliefs effecting health care.    Social Determinants of Health   Financial Resource Strain:  Not on file  Food Insecurity: Not on file  Transportation Needs: Not on file  Physical Activity: Not on file  Stress: Not on file  Social Connections: Not on file  Intimate Partner Violence: Not on file     BP 130/90   Ht 5\' 4"  (1.626 m)   Wt 152 lb 3.2 oz (69 kg)   SpO2 98%   BMI 26.13 kg/m   Physical Exam:  Well appearing NAD HEENT: Unremarkable Neck:  No JVD, no thyromegally Lymphatics:  No adenopathy Back:  No CVA tenderness Lungs:  Clear with no wheezes HEART:  Regular rate rhythm, no murmurs, no rubs, no clicks Abd:  soft, positive bowel sounds, no organomegally, no rebound, no guarding Ext:  2 plus pulses, no edema, no cyanosis, no clubbing Skin:  No rashes no nodules Neuro:  CN II through XII intact, motor grossly intact  EKG - atrial fib  DEVICE  Normal device function.  See PaceArt for details.   Assess/Plan:  1. Atrial fib - this is a new problem. Her rate is reasonably well controlled. She will continue her dose of amiodarone and I will schedule her for DCCV  in 2 weeks. I would anticipate continuing anti-coag. 2. VF arrest - she has had no recurrent arrhythmias on low dose amiodarone. 3. ICD - her [place] ICD is working normally. She is s/p gen change. 4. Chronic systolic heart  failure - her symptoms are class 3 in atrial fib. She will continue her current meds.   Salome Spotted.

## 2022-04-15 NOTE — Progress Notes (Signed)
HPI [name] returns today for followup. She is a pleasant 76 yo woman with a h/o chronic systolic heart failure, VF arrest s/p ICD insertion, mitral regurge, and chronic amio therapy for prevention of her ventricular arrhythmias. She underwent ICD gen change complicated by hematoma. She is sore. She also has developed atrial fib which has become permanent despite being on low dose amiodarone. No fever or chills.  Allergies  Allergen Reactions   Morphine And Related Itching and Nausea And Vomiting   Codeine Nausea And Vomiting   Demerol [Meperidine Hcl] Nausea And Vomiting   Meperidine Hcl Nausea And Vomiting   Pollen Extract Itching and Other (See Comments)    Seasonal allergies     Current Outpatient Medications  Medication Sig Dispense Refill   acetaminophen (TYLENOL) 500 MG tablet Take 1,000 mg by mouth every 6 (six) hours as needed (back/neck pain). For pain     amiodarone (PACERONE) 200 MG tablet Take one tablet by mouth daily Monday through Saturday.  Take TWO tablets by mouth Saturday and Sunday. 114 tablet 3   carvedilol (COREG) 3.125 MG tablet Take 1 tablet (3.125 mg total) by mouth 2 (two) times daily with a meal. 60 tablet 2   ELIQUIS 5 MG TABS tablet Take 5 mg by mouth 2 (two) times daily.     gabapentin (NEURONTIN) 100 MG capsule 100 mg 2 (two) times daily.     Glycerin-Hypromellose-PEG 400 (VISINE DRY EYE) 0.2-0.2-1 % SOLN Place 1-2 drops into both eyes 3 (three) times daily as needed (dry/irritated eyes).     levothyroxine (SYNTHROID) 100 MCG tablet Take 100 mcg by mouth daily before breakfast.     Multiple Vitamin (MULTIVITAMIN WITH MINERALS) TABS tablet Take 1 tablet by mouth daily. 100 tablet 0   ondansetron (ZOFRAN) 4 MG tablet Take 1 tablet (4 mg total) by mouth daily as needed for nausea or vomiting. 12 tablet 1   PARoxetine (PAXIL) 30 MG tablet Take 60 mg by mouth every evening.     Polyvinyl Alcohol-Povidone (MURINE TEARS FOR DRY EYES OP) Place 2 drops  into both eyes 2 (two) times daily as needed. For dry eyes     pravastatin (PRAVACHOL) 40 MG tablet TAKE 1 TABLET BY MOUTH EVERY EVENING. 90 tablet 3   sacubitril-valsartan (ENTRESTO) 49-51 MG Take 1 tablet by mouth 2 (two) times daily. 60 tablet 6   sodium chloride (OCEAN) 0.65 % SOLN nasal spray Place 1 spray into both nostrils as needed (dry nasal passages).     spironolactone (ALDACTONE) 25 MG tablet Take 12.5 mg by mouth daily.     No current facility-administered medications for this visit.     Past Medical History:  Diagnosis Date   Anxiety    Arthritis    Automatic implantable cardioverter-defibrillator in situ    Cardiomegaly    CHF (congestive heart failure) ([place]) 05/2013   class III, severe   Chronic bronchitis (HCC)    Depression    GERD (gastroesophageal reflux disease)    Headache(784.0)    migraines   Heart murmur    History of sudden cardiac arrest successfully resuscitated 2011   HLD (hyperlipidemia)    Hypertension    Hypothyroidism    IBS (irritable bowel syndrome)    Paroxysmal atrial fibrillation (HCC)    Pneumonia    Renal disorder    Scoliosis    Severe mitral regurgitation 05/2013   s/p MV repair with annuloplasty    ROS:   All systems  reviewed and negative except as noted in the HPI.   Past Surgical History:  Procedure Laterality Date   ABDOMINAL HYSTERECTOMY     APPENDECTOMY     CARDIAC CATHETERIZATION     CARDIAC DEFIBRILLATOR PLACEMENT  2010   Vfib arrest, single chamber ICD   CARPAL TUNNEL RELEASE Right 03/05/2014   Procedure: RIGHT CARPAL TUNNEL RELEASE;  Surgeon: Schuyler Amor, MD;  Location: [place];  Service: Orthopedics;  Laterality: Right;   CATARACT EXTRACTION W/PHACO Left 09/03/2020   Procedure: CATARACT EXTRACTION PHACO AND INTRAOCULAR LENS PLACEMENT ([place]) LEFT;  Surgeon: Birder Robson, MD;  Location: [facility];  Service: Ophthalmology;  Laterality: Left;  Lot # [id number] H [place]: 01:04.1 CDE:11:62    CATARACT EXTRACTION  W/PHACO Right 12/31/2020   Procedure: CATARACT EXTRACTION PHACO AND INTRAOCULAR LENS PLACEMENT ([place]) RIGHT;  Surgeon: Birder Robson, MD;  Location: [facility];  Service: Ophthalmology;  Laterality: Right;  Lot #[id number] H [place]: 00:55.8 CDE: 6.29   COLONOSCOPY     FRACTURE SURGERY     HERNIA REPAIR  as child   x 2   ICD GENERATOR CHANGEOUT N/A 02/02/2022   Procedure: [place];  Surgeon: Evans Lance, MD;  Location: [place] [facility];  Service: Cardiovascular;  Laterality: N/A;   INTRAOPERATIVE TRANSESOPHAGEAL ECHOCARDIOGRAM N/A 05/24/2013   Procedure: INTRAOPERATIVE TRANSESOPHAGEAL ECHOCARDIOGRAM;  Surgeon: Ivin Poot, MD;  Location: [place];  Service: Open Heart Surgery;  Laterality: N/A;   LEFT HEART CATHETERIZATION WITH CORONARY ANGIOGRAM N/A 05/16/2013   Procedure: LEFT HEART CATHETERIZATION WITH CORONARY ANGIOGRAM;  Surgeon: Clent Demark, MD;  Location: [place] CATH LAB;  Service: Cardiovascular;  Laterality: N/A;   MITRAL VALVE REPAIR N/A 05/24/2013   Surgeon: Ivin Poot, MD; Service: Open Heart Surgery   MULTIPLE EXTRACTIONS WITH ALVEOLOPLASTY N/A 05/21/2013   Procedure: Extraction of tooth #'s 2,4,18 with alveoloplasty and gross debridement of remaining teeth;  Surgeon: Lenn Cal, DDS;  Location: [place];  Service: Oral Surgery;  Laterality: N/A;   OPEN REDUCTION INTERNAL FIXATION (ORIF) DISTAL RADIAL FRACTURE Right 03/05/2014   Procedure: OPEN REDUCTION INTERNAL FIXATION (ORIF) RIGHT DISTAL RADIUS FRACTURE ;  Surgeon: Schuyler Amor, MD;  Location: [place];  Service: Orthopedics;  Laterality: Right;   TEE WITHOUT CARDIOVERSION  09/24/2012   Procedure: TRANSESOPHAGEAL ECHOCARDIOGRAM (TEE);  Surgeon: Fay Records, MD;  Location: [facility] ENDOSCOPY;  Service: Cardiovascular;  Laterality: N/A;     Family History  Problem Relation Age of Onset   Cancer - Other Mother    CAD Mother    Arthritis Mother    CVA Sister    CAD Sister    Seizures Brother    Heart disease  Father      Social History   Socioeconomic History   Marital status: Widowed    Spouse name: Not on file   Number of children: 1   Years of education: 9   Highest education level: Not on file  Occupational History   Occupation: Retired  Tobacco Use   Smoking status: Never   Smokeless tobacco: Never  Vaping Use   Vaping Use: Never used  Substance and Sexual Activity   Alcohol use: No   Drug use: No   Sexual activity: Yes    Birth control/protection: Surgical  Other Topics Concern   Not on file  Social History Narrative   Fun: Shop, read, dance   Denies religious beliefs effecting health care.    Social Determinants of Health   Financial Resource Strain:  Not on file  Food Insecurity: Not on file  Transportation Needs: Not on file  Physical Activity: Not on file  Stress: Not on file  Social Connections: Not on file  Intimate Partner Violence: Not on file     BP 130/90   Ht 5\' 4"  (1.626 m)   Wt 152 lb 3.2 oz (69 kg)   SpO2 98%   BMI 26.13 kg/m   Physical Exam:  Well appearing NAD HEENT: Unremarkable Neck:  No JVD, no thyromegally Lymphatics:  No adenopathy Back:  No CVA tenderness Lungs:  Clear with no wheezes HEART:  Regular rate rhythm, no murmurs, no rubs, no clicks Abd:  soft, positive bowel sounds, no organomegally, no rebound, no guarding Ext:  2 plus pulses, no edema, no cyanosis, no clubbing Skin:  No rashes no nodules Neuro:  CN II through XII intact, motor grossly intact  EKG - atrial fib  DEVICE  Normal device function.  See PaceArt for details.   Assess/Plan:  1. Atrial fib - this is a new problem. Her rate is reasonably well controlled. She will continue her dose of amiodarone and I will schedule her for DCCV  in 2 weeks. I would anticipate continuing anti-coag. 2. VF arrest - she has had no recurrent arrhythmias on low dose amiodarone. 3. ICD - her [place] ICD is working normally. She is s/p gen change. 4. Chronic systolic heart  failure - her symptoms are class 3 in atrial fib. She will continue her current meds.   Salome Spotted.

## 2022-04-15 NOTE — Patient Instructions (Addendum)
Medication Instructions:  Your physician recommends that you continue on your current medications as directed. Please refer to the Current Medication list given to you today.  Labwork: None ordered.  Testing/Procedures: Your physician has recommended that you have a Cardioversion (DCCV). Electrical Cardioversion uses a jolt of electricity to your heart either through paddles or wired patches attached to your chest. This is a controlled, usually prescheduled, procedure. Defibrillation is done under light anesthesia in the hospital, and you usually go home the day of the procedure. This is done to get your heart back into a normal rhythm. You are not awake for the procedure. Please see the instruction sheet given to you today.  Follow-Up: Your physician wants you to follow-up in: 3 months with Cristopher Peru, MD   July 21, 2022 at 2:15 pm  Remote monitoring is used to monitor your ICD from home. This monitoring reduces the number of office visits required to check your device to one time per year. It allows [place] to keep an eye on the functioning of your device to ensure it is working properly. You are scheduled for a device check from home on 05/23/22. You may send your transmission at any time that day. If you have a wireless device, the transmission will be sent automatically. After your physician reviews your transmission, you will receive a postcard with your next transmission date.  Any Other Special Instructions Will Be Listed Below (If Applicable).  If you need a refill on your cardiac medications before your next appointment, please call your pharmacy.   Electrical Cardioversion Electrical cardioversion is the delivery of a jolt of electricity to restore a normal rhythm to the heart. A rhythm that is too fast or is not regular keeps the heart from pumping well. In this procedure, sticky patches or metal paddles are placed on the chest to deliver electricity to the heart from a device. This  procedure may be done in an emergency if: There is low or no blood pressure as a result of the heart rhythm. Normal rhythm must be restored as fast as possible to protect the brain and heart from further damage. It may save a life. This may also be a scheduled procedure for irregular or fast heart rhythms that are not immediately life-threatening. Tell a health care provider about: Any allergies you have. All medicines you are taking, including vitamins, herbs, eye drops, creams, and over-the-counter medicines. Any problems you or family members have had with anesthetic medicines. Any blood disorders you have. Any surgeries you have had. Any medical conditions you have. Whether you are pregnant or may be pregnant. What are the risks? Generally, this is a safe procedure. However, problems may occur, including: Allergic reactions to medicines. A blood clot that breaks free and travels to other parts of your body. The possible return of an abnormal heart rhythm within hours or days after the procedure. Your heart stopping (cardiac arrest). This is rare. What happens before the procedure? Medicines Your health care provider may have you start taking: Blood-thinning medicines (anticoagulants) so your blood does not clot as easily. Medicines to help stabilize your heart rate and rhythm. Ask your health care provider about: Changing or stopping your regular medicines. This is especially important if you are taking diabetes medicines or blood thinners. Taking medicines such as aspirin and ibuprofen. These medicines can thin your blood. Do not take these medicines unless your health care provider tells you to take them. Taking over-the-counter medicines, vitamins, herbs, and supplements. General  instructions Follow instructions from your health care provider about eating or drinking restrictions. Plan to have someone take you home from the hospital or clinic. If you will be going home right  after the procedure, plan to have someone with you for 24 hours. Ask your health care provider what steps will be taken to help prevent infection. These may include washing your skin with a germ-killing soap. What happens during the procedure?  An IV will be inserted into one of your veins. Sticky patches (electrodes) or metal paddles may be placed on your chest. You will be given a medicine to help you relax (sedative). An electrical shock will be delivered. The procedure may vary among health care providers and hospitals. What can I expect after the procedure? Your blood pressure, heart rate, breathing rate, and blood oxygen level will be monitored until you leave the hospital or clinic. Your heart rhythm will be watched to make sure it does not change. You may have some redness on the skin where the shocks were given. Follow these instructions at home: Do not drive for 24 hours if you were given a sedative during your procedure. Take over-the-counter and prescription medicines only as told by your health care provider. Ask your health care provider how to check your pulse. Check it often. Rest for 48 hours after the procedure or as told by your health care provider. Avoid or limit your caffeine use as told by your health care provider. Keep all follow-up visits as told by your health care provider. This is important. Contact a health care provider if: You feel like your heart is beating too quickly or your pulse is not regular. You have a serious muscle cramp that does not go away. Get help right away if: You have discomfort in your chest. You are dizzy or you feel faint. You have trouble breathing or you are short of breath. Your speech is slurred. You have trouble moving an arm or leg on one side of your body. Your fingers or toes turn cold or blue. Summary Electrical cardioversion is the delivery of a jolt of electricity to restore a normal rhythm to the heart. This procedure  may be done right away in an emergency or may be a scheduled procedure if the condition is not an emergency. Generally, this is a safe procedure. After the procedure, check your pulse often as told by your health care provider. This information is not intended to replace advice given to you by your health care provider. Make sure you discuss any questions you have with your health care provider. Document Revised: 09/02/2021 Document Reviewed: 05/06/2019 Elsevier Patient Education  [place].

## 2022-04-21 ENCOUNTER — Encounter (HOSPITAL_COMMUNITY): Payer: Self-pay | Admitting: Cardiology

## 2022-04-21 NOTE — Progress Notes (Signed)
Attempted to obtain medical history via telephone, unable to reach at this time. HIPAA compliant voicemail message left requesting return call to pre surgical testing department. 

## 2022-04-26 NOTE — Anesthesia Preprocedure Evaluation (Signed)
Anesthesia Evaluation  Patient identified by MRN, date of birth, ID band Patient awake    Reviewed: Allergy & Precautions, NPO status , Patient's Chart, lab work & pertinent test results  Airway Mallampati: II  TM Distance: >3 FB Neck ROM: Full    Dental  (+) Dental Advisory Given, Missing, Poor Dentition,    Pulmonary neg pulmonary ROS,    Pulmonary exam normal breath sounds clear to auscultation       Cardiovascular hypertension, +CHF  Normal cardiovascular exam+ dysrhythmias Atrial Fibrillation + Cardiac Defibrillator + Valvular Problems/Murmurs (s/p MV repair) MR  Rhythm:Irregular Rate:Normal  TTE 2022 1. Septal / lateral hypokinesis inferior akinesis . Left ventricular  ejection fraction, by estimation, is 30 to 35%. The left ventricle has  moderately decreased function. The left ventricle has no regional wall  motion abnormalities. The left ventricular  internal cavity size was moderately dilated. There is mild left  ventricular hypertrophy. Left ventricular diastolic parameters are  indeterminate.  2. AICD wires noted . Right ventricular systolic function is normal. The  right ventricular size is normal. There is moderately elevated pulmonary  artery systolic pressure.  3. Left atrial size was moderately dilated.  4. Right atrial size was mildly dilated.  5. Post repair trivial residual MR MVA PT1/2 3.14 and mean gradient at HR  59 bpm only 3 mmHg . The mitral valve has been repaired/replaced. Trivial  mitral valve regurgitation. No evidence of mitral stenosis.  6. The aortic valve is tricuspid. There is mild calcification of the  aortic valve. Aortic valve regurgitation is not visualized. Mild aortic  valve sclerosis is present, with no evidence of aortic valve stenosis.  7. The inferior vena cava is dilated in size with >50% respiratory  variability, suggesting right atrial pressure of 8 mmHg.     Neuro/Psych  Headaches, PSYCHIATRIC DISORDERS Anxiety Depression    GI/Hepatic Neg liver ROS, GERD  ,  Endo/Other  Hypothyroidism   Renal/GU negative Renal ROS  negative genitourinary   Musculoskeletal  (+) Arthritis ,   Abdominal   Peds  Hematology negative hematology ROS (+)   Anesthesia Other Findings   Reproductive/Obstetrics                            Anesthesia Physical Anesthesia Plan  ASA: 3  Anesthesia Plan: General   Post-op Pain Management:    Induction: Intravenous  PONV Risk Score and Plan: 3 and Propofol infusion and Treatment may vary due to age or medical condition  Airway Management Planned: Natural Airway  Additional Equipment:   Intra-op Plan:   Post-operative Plan:   Informed Consent: I have reviewed the patients History and Physical, chart, labs and discussed the procedure including the risks, benefits and alternatives for the proposed anesthesia with the patient or authorized representative who has indicated his/her understanding and acceptance.     Dental advisory given  Plan Discussed with: CRNA  Anesthesia Plan Comments:       Anesthesia Quick Evaluation

## 2022-04-27 ENCOUNTER — Ambulatory Visit (HOSPITAL_BASED_OUTPATIENT_CLINIC_OR_DEPARTMENT_OTHER): Payer: Medicare Other | Admitting: Anesthesiology

## 2022-04-27 ENCOUNTER — Other Ambulatory Visit: Payer: Self-pay

## 2022-04-27 ENCOUNTER — Ambulatory Visit (HOSPITAL_COMMUNITY)
Admission: RE | Admit: 2022-04-27 | Discharge: 2022-04-27 | Disposition: A | Discharge status: Home / Self Care | Payer: Medicare Other | Attending: Cardiology | Admitting: Cardiology

## 2022-04-27 ENCOUNTER — Ambulatory Visit (HOSPITAL_COMMUNITY): Payer: Medicare Other | Admitting: Anesthesiology

## 2022-04-27 ENCOUNTER — Encounter (HOSPITAL_COMMUNITY)
Admission: RE | Disposition: A | Discharge status: Home / Self Care | Payer: Self-pay | Source: Home / Self Care | Attending: Cardiology

## 2022-04-27 ENCOUNTER — Encounter (HOSPITAL_COMMUNITY): Payer: Self-pay | Admitting: Cardiology

## 2022-04-27 DIAGNOSIS — E039 Hypothyroidism, unspecified: Secondary | ICD-10-CM

## 2022-04-27 DIAGNOSIS — Z95810 Presence of automatic (implantable) cardiac defibrillator: Secondary | ICD-10-CM | POA: Insufficient documentation

## 2022-04-27 DIAGNOSIS — I5022 Chronic systolic (congestive) heart failure: Secondary | ICD-10-CM | POA: Insufficient documentation

## 2022-04-27 DIAGNOSIS — I4821 Permanent atrial fibrillation: Secondary | ICD-10-CM | POA: Diagnosis not present

## 2022-04-27 DIAGNOSIS — I480 Paroxysmal atrial fibrillation: Secondary | ICD-10-CM

## 2022-04-27 DIAGNOSIS — I340 Nonrheumatic mitral (valve) insufficiency: Secondary | ICD-10-CM | POA: Insufficient documentation

## 2022-04-27 DIAGNOSIS — Z79899 Other long term (current) drug therapy: Secondary | ICD-10-CM | POA: Insufficient documentation

## 2022-04-27 DIAGNOSIS — I110 Hypertensive heart disease with heart failure: Secondary | ICD-10-CM

## 2022-04-27 DIAGNOSIS — I509 Heart failure, unspecified: Secondary | ICD-10-CM | POA: Diagnosis not present

## 2022-04-27 DIAGNOSIS — I4891 Unspecified atrial fibrillation: Secondary | ICD-10-CM | POA: Diagnosis not present

## 2022-04-27 HISTORY — PX: CARDIOVERSION: SHX1299

## 2022-04-27 LAB — POCT I-STAT, CHEM 8
BUN: 29 mg/dL — ABNORMAL HIGH (ref 8–23)
Calcium, Ion: 1.22 mmol/L (ref 1.15–1.40)
Chloride: 103 mmol/L (ref 98–111)
Creatinine, Ser: 1.4 mg/dL — ABNORMAL HIGH (ref 0.44–1.00)
Glucose, Bld: 97 mg/dL (ref 70–99)
HCT: 37 % (ref 36.0–46.0)
Hemoglobin: 12.6 g/dL (ref 12.0–15.0)
Potassium: 5.2 mmol/L — ABNORMAL HIGH (ref 3.5–5.1)
Sodium: 133 mmol/L — ABNORMAL LOW (ref 135–145)
TCO2: 23 mmol/L (ref 22–32)

## 2022-04-27 SURGERY — CARDIOVERSION
Anesthesia: General

## 2022-04-27 MED ORDER — SODIUM CHLORIDE 0.9 % IV SOLN: INTRAVENOUS | Status: DC

## 2022-04-27 MED ORDER — PROPOFOL 10 MG/ML IV BOLUS
INTRAVENOUS | Status: DC | PRN
  Administered 2022-04-27: 50 mg via INTRAVENOUS

## 2022-04-27 MED ORDER — LACTATED RINGERS IV SOLN: INTRAVENOUS | Status: DC | PRN

## 2022-04-27 NOTE — Discharge Instructions (Signed)

## 2022-04-27 NOTE — Anesthesia Procedure Notes (Signed)
Procedure Name: General with mask airway Date/Time: 04/27/2022 9:32 AM  Performed by: Anastasio Auerbach, CRNAPre-anesthesia Checklist: Patient identified, Emergency Drugs available, Suction available, Patient being monitored and Timeout performed Patient Re-evaluated:Patient Re-evaluated prior to induction Oxygen Delivery Method: Ambu bag Preoxygenation: Pre-oxygenation with 100% oxygen Induction Type: IV induction

## 2022-04-27 NOTE — Transfer of Care (Signed)
Immediate Anesthesia Transfer of Care Note  Patient: [name]  Procedure(s) Performed: CARDIOVERSION  Patient Location: PACU and Endoscopy Unit  Anesthesia Type:General  Level of Consciousness: awake, alert  and oriented  Airway & Oxygen Therapy: Patient Spontanous Breathing and Patient connected to nasal cannula oxygen  Post-op Assessment: Report given to RN and Post -op Vital signs reviewed and stable  Post vital signs: Reviewed and stable  Last Vitals:  Vitals Value Taken Time  BP 116/65 04/27/22 0936  Temp    Pulse 56 04/27/22 0939  Resp 19 04/27/22 0939  SpO2 94 % 04/27/22 0939    Last Pain:  Vitals:   04/27/22 0830  TempSrc: Oral  PainSc: 0-No pain         Complications: No notable events documented.

## 2022-04-27 NOTE — Interval H&P Note (Signed)
History and Physical Interval Note:  04/27/2022 8:58 AM  Abran Cantor  has presented today for surgery, with the diagnosis of afib.  The various methods of treatment have been discussed with the patient and family. After consideration of risks, benefits and other options for treatment, the patient has consented to  Procedure(s): CARDIOVERSION (N/A) as a surgical intervention.  The patient's history has been reviewed, patient examined, no change in status, stable for surgery.  I have reviewed the patient's chart and labs.  Questions were answered to the patient's satisfaction.     Freada Bergeron

## 2022-04-27 NOTE — CV Procedure (Signed)
Procedure: Electrical Cardioversion Indications:  Atrial Fibrillation  Procedure Details:  Consent: Risks of procedure as well as the alternatives and risks of each were explained to the (patient/caregiver).  Consent for procedure obtained.  Time Out: Verified patient identification, verified procedure, site/side was marked, verified correct patient position, special equipment/implants available, medications/allergies/relevent history reviewed, required imaging and test results available. PERFORMED.  Patient placed on cardiac monitor, pulse oximetry, supplemental oxygen as necessary.  Sedation given:  Propofol 50mg  Pacer pads placed anterior and posterior chest.  Cardioverted 1 time(s).  Cardioversion with synchronized biphasic 150J shock.  Evaluation: Findings: Post procedure EKG shows:  Sinus bradycardia Complications: None Patient did tolerate procedure well.  Time Spent Directly with the Patient:  47minutes   Freada Bergeron 04/27/2022, 9:38 AM

## 2022-04-27 NOTE — Anesthesia Postprocedure Evaluation (Signed)
Anesthesia Post Note  Patient: [name]  Procedure(s) Performed: CARDIOVERSION     Patient location during evaluation: Endoscopy Anesthesia Type: General Level of consciousness: awake and alert Pain management: pain level controlled Vital Signs Assessment: post-procedure vital signs reviewed and stable Respiratory status: spontaneous breathing, nonlabored ventilation, respiratory function stable and patient connected to nasal cannula oxygen Cardiovascular status: blood pressure returned to baseline and stable Postop Assessment: no apparent nausea or vomiting Anesthetic complications: no   No notable events documented.  Last Vitals:  Vitals:   04/27/22 0950 04/27/22 1000  BP: 116/88 (!) 123/58  Pulse: (!) 55 (!) 51  Resp: 16 17  Temp:    SpO2: 94% 95%    Last Pain:  Vitals:   04/27/22 1000  TempSrc:   PainSc: 0-No pain                 Cissy Galbreath L Zoanne Newill

## 2022-04-28 ENCOUNTER — Encounter (HOSPITAL_COMMUNITY): Payer: Self-pay | Admitting: Cardiology

## 2022-05-17 ENCOUNTER — Encounter: Payer: Medicare Other | Admitting: Internal Medicine

## 2022-07-06 DIAGNOSIS — I480 Paroxysmal atrial fibrillation: Secondary | ICD-10-CM | POA: Diagnosis not present

## 2022-07-06 DIAGNOSIS — E119 Type 2 diabetes mellitus without complications: Secondary | ICD-10-CM | POA: Diagnosis not present

## 2022-07-06 DIAGNOSIS — E039 Hypothyroidism, unspecified: Secondary | ICD-10-CM | POA: Diagnosis not present

## 2022-07-06 DIAGNOSIS — E785 Hyperlipidemia, unspecified: Secondary | ICD-10-CM | POA: Diagnosis not present

## 2022-07-06 DIAGNOSIS — I10 Essential (primary) hypertension: Secondary | ICD-10-CM | POA: Diagnosis not present

## 2022-07-21 ENCOUNTER — Ambulatory Visit: Payer: Medicare Other | Attending: Internal Medicine | Admitting: Internal Medicine

## 2022-07-21 VITALS — BP 158/86 | HR 66 | Ht 64.0 in | Wt 151.0 lb

## 2022-07-21 DIAGNOSIS — I480 Paroxysmal atrial fibrillation: Secondary | ICD-10-CM | POA: Diagnosis not present

## 2022-07-21 DIAGNOSIS — Z95810 Presence of automatic (implantable) cardiac defibrillator: Secondary | ICD-10-CM

## 2022-07-21 DIAGNOSIS — I4901 Ventricular fibrillation: Secondary | ICD-10-CM

## 2022-07-21 MED ORDER — CARVEDILOL 6.25 MG PO TABS: 6.2500 mg | ORAL_TABLET | Freq: Two times a day (BID) | ORAL | 3 refills | Status: DC

## 2022-07-21 NOTE — Progress Notes (Signed)
HPI [name] returns today for followup. She is a pleasant 76 yo woman with a h/o chronic systolic heart failure, VF arrest s/p ICD insertion, mitral regurge, and chronic amio therapy for prevention of her ventricular arrhythmias. She also has developed atrial fib which has become permanent despite being on low dose amiodarone. No fever or chills. We increased her dose and had her come in about 3 months ago for DCCV. She has done well in the interim but does note that her bp has been elevated at home.  Allergies  Allergen Reactions   Morphine And Related Itching and Nausea And Vomiting   Codeine Nausea And Vomiting   Demerol [Meperidine Hcl] Nausea And Vomiting   Meperidine Hcl Nausea And Vomiting   Pollen Extract Itching and Other (See Comments)    Seasonal allergies     Current Outpatient Medications  Medication Sig Dispense Refill   acetaminophen (TYLENOL) 500 MG tablet Take 1,000 mg by mouth every 6 (six) hours as needed (back/neck pain). For pain     amiodarone (PACERONE) 200 MG tablet Take one tablet by mouth daily Monday through Saturday.  Take TWO tablets by mouth Saturday and Sunday. 114 tablet 3   carvedilol (COREG) 6.25 MG tablet Take 1 tablet (6.25 mg total) by mouth 2 (two) times daily. 180 tablet 3   ELIQUIS 5 MG TABS tablet Take 5 mg by mouth 2 (two) times daily.     gabapentin (NEURONTIN) 100 MG capsule 100 mg 2 (two) times daily.     Glycerin-Hypromellose-PEG 400 (VISINE DRY EYE) 0.2-0.2-1 % SOLN Place 1-2 drops into both eyes 3 (three) times daily as needed (dry/irritated eyes).     Multiple Vitamin (MULTIVITAMIN WITH MINERALS) TABS tablet Take 1 tablet by mouth daily. 100 tablet 0   PARoxetine (PAXIL) 30 MG tablet Take 60 mg by mouth every evening.     Polyvinyl Alcohol-Povidone (MURINE TEARS FOR DRY EYES OP) Place 2 drops into both eyes 2 (two) times daily as needed. For dry eyes     pravastatin (PRAVACHOL) 40 MG tablet TAKE 1 TABLET BY MOUTH EVERY EVENING. 90  tablet 3   sacubitril-valsartan (ENTRESTO) 49-51 MG Take 1 tablet by mouth 2 (two) times daily. 60 tablet 6   sodium chloride (OCEAN) 0.65 % SOLN nasal spray Place 1 spray into both nostrils as needed (dry nasal passages).     spironolactone (ALDACTONE) 25 MG tablet Take 12.5 mg by mouth daily.     levothyroxine (SYNTHROID) 125 MCG tablet Take 125 mcg by mouth daily.     ondansetron (ZOFRAN) 4 MG tablet Take 1 tablet (4 mg total) by mouth daily as needed for nausea or vomiting. (Patient not taking: Reported on 07/21/2022) 12 tablet 1   No current facility-administered medications for this visit.     Past Medical History:  Diagnosis Date   Anxiety    Arthritis    Automatic implantable cardioverter-defibrillator in situ    Cardiomegaly    CHF (congestive heart failure) ([place]) 05/2013   class III, severe   Chronic bronchitis (HCC)    Depression    GERD (gastroesophageal reflux disease)    Headache(784.0)    migraines   Heart murmur    History of sudden cardiac arrest successfully resuscitated 2011   HLD (hyperlipidemia)    Hypertension    Hypothyroidism    IBS (irritable bowel syndrome)    Paroxysmal atrial fibrillation (HCC)    Pneumonia    Renal disorder  Scoliosis    Severe mitral regurgitation 05/2013   s/p MV repair with annuloplasty    ROS:   All systems reviewed and negative except as noted in the HPI.   Past Surgical History:  Procedure Laterality Date   ABDOMINAL HYSTERECTOMY     APPENDECTOMY     CARDIAC CATHETERIZATION     CARDIAC DEFIBRILLATOR PLACEMENT  2010   Vfib arrest, single chamber ICD   CARDIOVERSION N/A 04/27/2022   Procedure: CARDIOVERSION;  Surgeon: Freada Bergeron, MD;  Location: [facility] ENDOSCOPY;  Service: Cardiovascular;  Laterality: N/A;   CARPAL TUNNEL RELEASE Right 03/05/2014   Procedure: RIGHT CARPAL TUNNEL RELEASE;  Surgeon: Schuyler Amor, MD;  Location: [place];  Service: Orthopedics;  Laterality: Right;   CATARACT EXTRACTION W/PHACO  Left 09/03/2020   Procedure: CATARACT EXTRACTION PHACO AND INTRAOCULAR LENS PLACEMENT ([place]) LEFT;  Surgeon: Birder Robson, MD;  Location: [facility] ORS;  Service: Ophthalmology;  Laterality: Left;  Lot # [id number] H [place]: 01:04.1 CDE:11:62    CATARACT EXTRACTION W/PHACO Right 12/31/2020   Procedure: CATARACT EXTRACTION PHACO AND INTRAOCULAR LENS PLACEMENT ([facility]) RIGHT;  Surgeon: Birder Robson, MD;  Location: [facility] ORS;  Service: Ophthalmology;  Laterality: Right;  Lot #[id number] H [place]: 00:55.8 CDE: 6.29   COLONOSCOPY     FRACTURE SURGERY     HERNIA REPAIR  as child   x 2   ICD GENERATOR CHANGEOUT N/A 02/02/2022   Procedure: [place];  Surgeon: Evans Lance, MD;  Location: [place] CV LAB;  Service: Cardiovascular;  Laterality: N/A;   INTRAOPERATIVE TRANSESOPHAGEAL ECHOCARDIOGRAM N/A 05/24/2013   Procedure: INTRAOPERATIVE TRANSESOPHAGEAL ECHOCARDIOGRAM;  Surgeon: Ivin Poot, MD;  Location: [place];  Service: Open Heart Surgery;  Laterality: N/A;   LEFT HEART CATHETERIZATION WITH CORONARY ANGIOGRAM N/A 05/16/2013   Procedure: LEFT HEART CATHETERIZATION WITH CORONARY ANGIOGRAM;  Surgeon: Clent Demark, MD;  Location: [place] CATH LAB;  Service: Cardiovascular;  Laterality: N/A;   MITRAL VALVE REPAIR N/A 05/24/2013   Surgeon: Ivin Poot, MD; Service: Open Heart Surgery   MULTIPLE EXTRACTIONS WITH ALVEOLOPLASTY N/A 05/21/2013   Procedure: Extraction of tooth #'s 2,4,18 with alveoloplasty and gross debridement of remaining teeth;  Surgeon: Lenn Cal, DDS;  Location: [place];  Service: Oral Surgery;  Laterality: N/A;   OPEN REDUCTION INTERNAL FIXATION (ORIF) DISTAL RADIAL FRACTURE Right 03/05/2014   Procedure: OPEN REDUCTION INTERNAL FIXATION (ORIF) RIGHT DISTAL RADIUS FRACTURE ;  Surgeon: Schuyler Amor, MD;  Location: [place];  Service: Orthopedics;  Laterality: Right;   TEE WITHOUT CARDIOVERSION  09/24/2012   Procedure: TRANSESOPHAGEAL ECHOCARDIOGRAM (TEE);  Surgeon: Fay Records, MD;  Location[facility] ENDOSCOPY;  Service: Cardiovascular;  Laterality: N/A;     Family History  Problem Relation Age of Onset   Cancer - Other Mother    CAD Mother    Arthritis Mother    CVA Sister    CAD Sister    Seizures Brother    Heart disease Father      Social History   Socioeconomic History   Marital status: Widowed    Spouse name: Not on file   Number of children: 1   Years of education: 9   Highest education level: Not on file  Occupational History   Occupation: Retired  Tobacco Use   Smoking status: Never   Smokeless tobacco: Never  Vaping Use   Vaping Use: Never used  Substance and Sexual Activity   Alcohol use: No   Drug use: No  Sexual activity: Yes    Birth control/protection: Surgical  Other Topics Concern   Not on file  Social History Narrative   Fun: Shop, read, dance   Denies religious beliefs effecting health care.    Social Determinants of Health   Financial Resource Strain: Not on file  Food Insecurity: Not on file  Transportation Needs: Not on file  Physical Activity: Not on file  Stress: Not on file  Social Connections: Not on file  Intimate Partner Violence: Not on file     BP (!) 158/86   Pulse 66   Ht 5\' 4"  (1.626 m)   Wt 151 lb (68.5 kg)   SpO2 97%   BMI 25.92 kg/m   Physical Exam:  Well appearing NAD HEENT: Unremarkable Neck:  No JVD, no thyromegally Lymphatics:  No adenopathy Back:  No CVA tenderness Lungs:  Clear HEART:  Regular rate rhythm, no murmurs, no rubs, no clicks Abd:  soft, positive bowel sounds, no organomegally, no rebound, no guarding Ext:  2 plus pulses, no edema, no cyanosis, no clubbing Skin:  No rashes no nodules Neuro:  CN II through XII intact, motor grossly intact  EKG - nsr  DEVICE  Normal device function.  See PaceArt for details.   Assess/Plan:   1. Atrial fib - she has undergone DCCV and is maintaining NSR. Continue amiodarone. 2. VF arrest - she has had no recurrent  arrhythmias on low dose amiodarone. 3. ICD - her [place] ICD is working normally. She is s/p gen change. 4. Chronic systolic heart failure - her symptoms are class 2 in NSR. She will continue her current meds. 5. HTN - her bp has not been well controlled. I asked her to increase the dose of coreg to 6.25 bid.  Jessica Sinner Havier Deeb,MD.

## 2022-07-21 NOTE — Patient Instructions (Addendum)
Medication Instructions:  Your physician has recommended you make the following change in your medication: MEDICATION INCREASE:   ( Carvedilol 6.25 Mg )  Take one tablet ( 6.25 mg ) by mouth two times a day.    Lab Work: None ordered.  If you have labs (blood work) drawn today and your tests are completely normal, you will receive your results only by: [place] (if you have MyChart) OR A paper copy in the mail If you have any lab test that is abnormal or we need to change your treatment, we will call you to review the results.  Testing/Procedures: None ordered.  Follow-Up: At [facility], you and your health needs are our priority.  As part of our continuing mission to provide you with exceptional heart care, we have created designated Provider Care Teams.  These Care Teams include your primary Cardiologist (physician) and Advanced Practice Providers (APPs -  Physician Assistants and Nurse Practitioners) who all work together to provide you with the care you need, when you need it.  We recommend signing up for the patient portal called "MyChart".  Sign up information is provided on this After Visit Summary.  MyChart is used to connect with patients for Virtual Visits (Telemedicine).  Patients are able to view lab/test results, encounter notes, upcoming appointments, etc.  Non-urgent messages can be sent to your provider as well.   To learn more about what you can do with MyChart, go to [email or web address].    Your next appointment:   1 year(s)  The format for your next appointment:   In Person  Provider:   Cristopher Peru, MD{or one of the following Advanced Practice Providers on your designated Care Team:   Tommye Standard, [place] Legrand Como "Jonni Sanger" Chalmers Cater, [place]  Remote monitoring is used to monitor your ICD from home. This monitoring reduces the number of office visits required to check your device to one time per year. It allows [place] to keep an eye on the functioning of your  device to ensure it is working properly. You are scheduled for a device check from home on 08/22/22. You may send your transmission at any time that day. If you have a wireless device, the transmission will be sent automatically. After your physician reviews your transmission, you will receive a postcard with your next transmission date.  Important Information About Sugar      Carvedilol Tablets What is this medication? CARVEDILOL (KAR ve dil ol) treats high blood pressure and heart failure. It may also be used to prevent further damage after a heart attack. It works by lowering your blood pressure and heart rate, making it easier for your heart to pump blood to the rest of your body. It belongs to a group of medications called beta blockers. This medicine may be used for other purposes; ask your health care provider or pharmacist if you have questions. COMMON BRAND NAME(S): Coreg What should I tell my care team before I take this medication? They need to know if you have any of these conditions: Circulation problems Diabetes History of heart attack or heart disease Liver disease Lung or breathing disease, like asthma Pheochromocytoma Slow or irregular heartbeat Thyroid disease An unusual or allergic reaction to carvedilol, other beta blockers, medications, foods, dyes, or preservatives Pregnant or trying to get pregnant Breast-feeding How should I use this medication? Take this medication by mouth. Take it as directed on the prescription label at the same time every day. Take it with food. Keep taking it  unless your care team tells you to stop. Talk to your care team about the use of this medication in children. Special care may be needed. Overdosage: If you think you have taken too much of this medicine contact a poison control center or emergency room at once. NOTE: This medicine is only for you. Do not share this medicine with others. What if I miss a dose? If you miss a dose, take  it as soon as you can. If it is almost time for your next dose, take only that dose. Do not take double or extra doses. What may interact with this medication? This medication may interact with the following: Certain medications for blood pressure, heart disease, irregular heartbeat Certain medications for depression, like fluoxetine or paroxetine Certain medications for diabetes, like glipizide or glyburide Cimetidine Clonidine Cyclosporine Digoxin MAOIs like Carbex, Eldepryl, Marplan, Nardil, and Parnate Reserpine Rifampin This list may not describe all possible interactions. Give your health care provider a list of all the medicines, herbs, non-prescription drugs, or dietary supplements you use. Also tell them if you smoke, drink alcohol, or use illegal drugs. Some items may interact with your medicine. What should I watch for while using this medication? Visit your care team for regular checks on your progress. Check your blood pressure as directed. Ask your care team what your blood pressure should be. Also, find out when you should contact them. Do not treat yourself for coughs, colds, or pain while you are using this medication without asking your care team for advice. Some medications may increase your blood pressure. You may get drowsy or dizzy. Do not drive, use machinery, or do anything that needs mental alertness until you know how this medication affects you. Do not stand up or sit up quickly, especially if you are an older patient. This reduces the risk of dizzy or fainting spells. Alcohol may interfere with the effect of this medication. Avoid alcoholic drinks. This medication may increase blood sugar. Ask your care team if changes in diet or medications are needed if you have diabetes. If you are going to need surgery or another procedure, tell your care team that you are using this medication. What side effects may I notice from receiving this medication? Side effects that you  should report to your care team as soon as possible: Allergic reactions--skin rash, itching, hives, swelling of the face, lips, tongue, or throat Heart failure--shortness of breath, swelling of the ankles, feet, or hands, sudden weight gain, unusual weakness or fatigue Low blood pressure--dizziness, feeling faint or lightheaded, blurry vision Raynaud's--cool, numb, or painful fingers or toes that may change color from pale, to blue, to red Slow heartbeat--dizziness, feeling faint or lightheaded, confusion, trouble breathing, unusual weakness or fatigue Worsening mood, feelings of depression Side effects that usually do not require medical attention (report to your care team if they continue or are bothersome): Change in sex drive or performance Diarrhea Dizziness Fatigue Headache This list may not describe all possible side effects. Call your doctor for medical advice about side effects. You may report side effects to FDA at 1-800-FDA-1088. Where should I keep my medication? Keep out of the reach of children and pets. Store at room temperature between 20 and 25 degrees C (68 and 77 degrees F). Protect from moisture. Keep the container tightly closed. Throw away any unused medication after the expiration date. NOTE: This sheet is a summary. It may not cover all possible information. If you have questions about this medicine, talk  to your doctor, pharmacist, or health care provider.  2023 Elsevier/Gold Standard (2019-05-10 00:00:00)

## 2022-07-22 NOTE — Addendum Note (Signed)
Addended by: Sharee Holster R on: 07/22/2022 11:49 AM   Modules accepted: Orders

## 2022-07-26 ENCOUNTER — Telehealth: Payer: Self-pay

## 2022-07-26 NOTE — Patient Outreach (Signed)
  Care Coordination   07/26/2022 Name: [name] MRN: [id number] DOB: [birth year]   Care Coordination Outreach Attempts:  An unsuccessful telephone outreach was attempted today to offer the patient information about available care coordination services as a benefit of their health plan.   Follow Up Plan:  Additional outreach attempts will be made to offer the patient care coordination information and services.   Encounter Outcome:  No Answer  Care Coordination Interventions Activated:  No   Care Coordination Interventions:  No, not indicated    Tomasa Rand, RN, BSN, CEN [facility] [employer] [phone]

## 2022-08-01 ENCOUNTER — Telehealth: Payer: Self-pay

## 2022-08-01 NOTE — Patient Outreach (Signed)
  Care Coordination   08/01/2022 Name: Karlye Ihrig MRN: [id number] DOB: [birth year]   Care Coordination Outreach Attempts:  A second unsuccessful outreach was attempted today to offer the patient with information about available care coordination services as a benefit of their health plan.     Follow Up Plan:  Additional outreach attempts will be made to offer the patient care coordination information and services.   Encounter Outcome:  No Answer  Care Coordination Interventions Activated:  No   Care Coordination Interventions:  No, not indicated    Tomasa Rand, RN, BSN, CEN [facility] [employer] [phone]

## 2022-08-15 ENCOUNTER — Telehealth: Payer: Self-pay

## 2022-08-15 NOTE — Patient Outreach (Signed)
  Care Coordination   08/15/2022 Name: [name] MRN: [id number] DOB: 05/21/1946   Care Coordination Outreach Attempts:  A third unsuccessful outreach was attempted today to offer the patient with information about available care coordination services as a benefit of their health plan.   Follow Up Plan:  No further outreach attempts will be made at this time. We have been unable to contact the patient to offer or enroll patient in care coordination services  Encounter Outcome:  No Answer  Care Coordination Interventions Activated:  No   Care Coordination Interventions:  No, not indicated    Tomasa Rand, RN, BSN, CEN [place] Coordinator [phone]

## 2022-08-29 ENCOUNTER — Other Ambulatory Visit: Payer: Self-pay | Admitting: Internal Medicine

## 2022-08-31 DIAGNOSIS — Z1211 Encounter for screening for malignant neoplasm of colon: Secondary | ICD-10-CM | POA: Diagnosis not present

## 2022-08-31 DIAGNOSIS — Z23 Encounter for immunization: Secondary | ICD-10-CM | POA: Diagnosis not present

## 2022-08-31 DIAGNOSIS — H9202 Otalgia, left ear: Secondary | ICD-10-CM | POA: Diagnosis not present

## 2022-08-31 DIAGNOSIS — Z9181 History of falling: Secondary | ICD-10-CM | POA: Diagnosis not present

## 2022-08-31 DIAGNOSIS — G629 Polyneuropathy, unspecified: Secondary | ICD-10-CM | POA: Diagnosis not present

## 2022-10-24 ENCOUNTER — Other Ambulatory Visit: Payer: Self-pay | Admitting: Student

## 2022-10-26 DIAGNOSIS — Z1211 Encounter for screening for malignant neoplasm of colon: Secondary | ICD-10-CM | POA: Diagnosis not present

## 2022-10-26 DIAGNOSIS — Z1212 Encounter for screening for malignant neoplasm of rectum: Secondary | ICD-10-CM | POA: Diagnosis not present

## 2022-11-06 LAB — COLOGUARD: Cologuard: POSITIVE — AB

## 2023-01-16 DIAGNOSIS — M170 Bilateral primary osteoarthritis of knee: Secondary | ICD-10-CM | POA: Diagnosis not present

## 2023-02-01 ENCOUNTER — Encounter: Payer: Self-pay | Admitting: *Deleted

## 2023-02-02 DIAGNOSIS — N189 Chronic kidney disease, unspecified: Secondary | ICD-10-CM | POA: Diagnosis not present

## 2023-02-02 DIAGNOSIS — E039 Hypothyroidism, unspecified: Secondary | ICD-10-CM | POA: Diagnosis not present

## 2023-02-02 DIAGNOSIS — E785 Hyperlipidemia, unspecified: Secondary | ICD-10-CM | POA: Diagnosis not present

## 2023-02-02 DIAGNOSIS — I10 Essential (primary) hypertension: Secondary | ICD-10-CM | POA: Diagnosis not present

## 2023-02-04 ENCOUNTER — Other Ambulatory Visit: Payer: Self-pay | Admitting: Internal Medicine

## 2023-02-06 NOTE — Telephone Encounter (Signed)
Rx request sent to pharmacy.  

## 2023-02-13 DIAGNOSIS — M170 Bilateral primary osteoarthritis of knee: Secondary | ICD-10-CM | POA: Diagnosis not present

## 2023-03-27 DIAGNOSIS — M170 Bilateral primary osteoarthritis of knee: Secondary | ICD-10-CM | POA: Diagnosis not present

## 2023-05-30 ENCOUNTER — Other Ambulatory Visit: Payer: Self-pay | Admitting: Internal Medicine

## 2023-05-31 ENCOUNTER — Other Ambulatory Visit: Payer: Self-pay | Admitting: Internal Medicine

## 2023-07-21 ENCOUNTER — Emergency Department
Admission: EM | Admit: 2023-07-21 | Discharge: 2023-07-21 | Disposition: A | Discharge status: Home / Self Care | Payer: Medicare Other | Attending: Emergency Medicine | Admitting: Emergency Medicine

## 2023-07-21 ENCOUNTER — Emergency Department: Payer: Medicare Other

## 2023-07-21 ENCOUNTER — Other Ambulatory Visit: Payer: Self-pay

## 2023-07-21 DIAGNOSIS — W19XXXA Unspecified fall, initial encounter: Secondary | ICD-10-CM

## 2023-07-21 DIAGNOSIS — W1830XA Fall on same level, unspecified, initial encounter: Secondary | ICD-10-CM | POA: Diagnosis not present

## 2023-07-21 DIAGNOSIS — M549 Dorsalgia, unspecified: Secondary | ICD-10-CM | POA: Diagnosis not present

## 2023-07-21 DIAGNOSIS — I517 Cardiomegaly: Secondary | ICD-10-CM | POA: Diagnosis not present

## 2023-07-21 DIAGNOSIS — R0781 Pleurodynia: Secondary | ICD-10-CM | POA: Diagnosis not present

## 2023-07-21 DIAGNOSIS — S20211A Contusion of right front wall of thorax, initial encounter: Secondary | ICD-10-CM | POA: Diagnosis not present

## 2023-07-21 DIAGNOSIS — S2241XA Multiple fractures of ribs, right side, initial encounter for closed fracture: Secondary | ICD-10-CM | POA: Diagnosis not present

## 2023-07-21 DIAGNOSIS — Z7901 Long term (current) use of anticoagulants: Secondary | ICD-10-CM | POA: Insufficient documentation

## 2023-07-21 DIAGNOSIS — I10 Essential (primary) hypertension: Secondary | ICD-10-CM | POA: Insufficient documentation

## 2023-07-21 DIAGNOSIS — Z743 Need for continuous supervision: Secondary | ICD-10-CM | POA: Diagnosis not present

## 2023-07-21 DIAGNOSIS — S20221A Contusion of right back wall of thorax, initial encounter: Secondary | ICD-10-CM | POA: Diagnosis not present

## 2023-07-21 DIAGNOSIS — S24109A Unspecified injury at unspecified level of thoracic spinal cord, initial encounter: Secondary | ICD-10-CM | POA: Diagnosis not present

## 2023-07-21 DIAGNOSIS — S299XXA Unspecified injury of thorax, initial encounter: Secondary | ICD-10-CM | POA: Diagnosis present

## 2023-07-21 DIAGNOSIS — E039 Hypothyroidism, unspecified: Secondary | ICD-10-CM | POA: Insufficient documentation

## 2023-07-21 MED ORDER — OXYCODONE-ACETAMINOPHEN 5-325 MG PO TABS
1.0000 | ORAL_TABLET | Freq: Once | ORAL | Status: AC
  Administered 2023-07-21: 1 via ORAL
  Filled 2023-07-21: qty 1

## 2023-07-21 MED ORDER — OXYCODONE-ACETAMINOPHEN 5-325 MG PO TABS: 1.0000 | ORAL_TABLET | Freq: Four times a day (QID) | ORAL | 0 refills | Status: DC | PRN

## 2023-07-21 NOTE — ED Notes (Signed)
Pt was given an incentive spirometer and was able to demonstrate proper use. Marland Kitchen.The patient is A&OX4, ambulatory at d/c with independent steady gait, NAD. Pt verbalized understanding of d/c instructions, prescription and follow up care.

## 2023-07-21 NOTE — ED Provider Notes (Signed)
-----------------------------------------   10:56 PM on 07/21/2023 -----------------------------------------  Blood pressure 126/82, pulse 80, temperature 98.1 F (36.7 C), resp. rate 18, height 5\' 3"  (1.6 m), weight 69.4 kg, SpO2 95%.  Assuming care from Greig Right, PA-C..  In short, [name] is a 77 y.o. female with a chief complaint of Fall .  Refer to the original H&P for additional details.  The current plan of care is to await x-ray.  Patient fell, had ecchymosis along the ribs.  Was complaining of right rib pain as well.  X-ray reveals fractures of the second third and fourth lateral ribs.  Given the history I suspect that these are acute not subacute.  Patient has no respiratory distress.  No indication for admission currently.  Patient is given concerning signs and symptoms and return precautions.  Will provide Percocet for pain relief.  Follow-up primary care as needed.   ED diagnosis:  Fall Rib fractures.    Racheal Patches, PA-C 07/21/23 2301    Dionne Bucy, MD 07/23/23 [phone]

## 2023-07-21 NOTE — ED Triage Notes (Signed)
Pt comes via [facility] from home with c/o trip and fall. Pt did not hit head or loc. Pt lost her balance and hit her arm on the counter. Pt states pain to right arm.

## 2023-07-21 NOTE — ED Provider Notes (Signed)
[facility] Provider Note    Event Date/Time   First MD Initiated Contact with Patient 07/21/23 1711     (approximate)   History   Fall   HPI  [name] is a 77 y.o. female with history of arthritis, hypertension, hypothyroidism, scoliosis among other chronic health problems presents emergency department after a fall on Monday.  Patient hit her arm on a chair fell hitting the back of her right chest.  Denies chest pain shortness of breath.  States she has large hematoma and it just hurts to move.  EMS came to the house and evaluated her and said she was okay.  Patient does take Eliquis.  Continues to have pain in the area that was hit on impact.      Physical Exam   Triage Vital Signs: ED Triage Vitals  Encounter Vitals Group     BP 07/21/23 1555 130/88     Systolic BP Percentile --      Diastolic BP Percentile --      Pulse Rate 07/21/23 1555 85     Resp 07/21/23 1555 18     Temp 07/21/23 1555 98.3 F (36.8 C)     Temp Source 07/21/23 1555 Oral     SpO2 07/21/23 1555 94 %     Weight 07/21/23 1600 153 lb (69.4 kg)     Height 07/21/23 1600 5\' 3"  (1.6 m)     Head Circumference --      Peak Flow --      Pain Score 07/21/23 1600 2     Pain Loc --      Pain Education --      Exclude from Growth Chart --     Most recent vital signs: Vitals:   07/21/23 1555  BP: 130/88  Pulse: 85  Resp: 18  Temp: 98.3 F (36.8 C)  SpO2: 94%     General: Awake, no distress.   CV:  Good peripheral perfusion. regular rate and  rhythm Resp:  Normal effort. Lungs cta, large hematoma noted on the right upper back close to the scapula and along the ribs Abd:  No distention.   Other:      ED Results / Procedures / Treatments   Labs (all labs ordered are listed, but only abnormal results are displayed) Labs Reviewed - No data to display   EKG     RADIOLOGY X-ray of the right ribs and chest    PROCEDURES:   Procedures   MEDICATIONS  ORDERED IN ED: Medications  oxyCODONE-acetaminophen (PERCOCET/ROXICET) 5-325 MG per tablet 1 tablet (1 tablet Oral Given 07/21/23 1750)     IMPRESSION / MDM / ASSESSMENT AND PLAN / ED COURSE  I reviewed the triage vital signs and the nursing notes.                              Differential diagnosis includes, but is not limited to, hematoma, contusion, fracture, strain  Patient's presentation is most consistent with acute complicated illness / injury requiring diagnostic workup.   X-ray of the right ribs and chest  I did consider subdural, subarachnoid etc. but patient is not complaining of a headache and the injury was more than 4 to 5 days ago so I do not feel a CT is warranted at this time.   X-ray still pending, patient appears to be more comfortable, I did offer to call her with result as I  feel that she is very stable although patient with prefer to stay and wait for the radiology results.  Plan of care will be if negative and nothing that would create an admission is to discharge with pain medication.  Care being transferred to Gala Romney, PA-C   FINAL CLINICAL IMPRESSION(S) / ED DIAGNOSES   Final diagnoses:  Fall, initial encounter  Contusion, back, right, initial encounter     Rx / DC Orders   ED Discharge Orders     None        Note:  This document was prepared using Dragon voice recognition software and may include unintentional dictation errors.    Faythe Ghee, PA-C 07/21/23 Jennet Maduro, MD 07/21/23 2027

## 2023-07-27 ENCOUNTER — Telehealth: Payer: Self-pay | Admitting: *Deleted

## 2023-07-27 NOTE — Progress Notes (Signed)
Care Coordination  Outreach Note  07/27/2023 Name: [name] MRN: [id number] DOB: 06/01/1946   Care Coordination Outreach Attempts: An unsuccessful telephone outreach was attempted today to offer the patient information about available care coordination services.  Follow Up Plan:  Additional outreach attempts will be made to offer the patient care coordination information and services.   Encounter Outcome:  No Answer  [name], CCMA Care Coordination Care Guide Direct Dial: [phone]

## 2023-07-28 NOTE — Progress Notes (Signed)
Care Coordination  Outreach Note  07/28/2023 Name: [name] MRN: [id number] DOB: Mar 06, 1946   Care Coordination Outreach Attempts: A second unsuccessful outreach was attempted today to offer the patient with information about available care coordination services.  Follow Up Plan:  Additional outreach attempts will be made to offer the patient care coordination information and services.   Encounter Outcome:  No Answer  [name], CCMA Care Coordination Care Guide Direct Dial: [phone]

## 2023-07-31 NOTE — Progress Notes (Signed)
Care Coordination  Outreach Note  07/31/2023 Name: [name] MRN: [id number] DOB: [birth year]   Care Coordination Outreach Attempts: A third unsuccessful outreach was attempted today to offer the patient with information about available care coordination services.  Follow Up Plan:  No further outreach attempts will be made at this time. We have been unable to contact the patient to offer or enroll patient in care coordination services  Encounter Outcome:  No Answer  [name], [facility] Care Coordination Care Guide Direct Dial: [phone]

## 2023-08-12 ENCOUNTER — Other Ambulatory Visit: Payer: Self-pay | Admitting: Student

## 2023-08-14 MED ORDER — ENTRESTO 49-51 MG PO TABS: 1.0000 | ORAL_TABLET | Freq: Two times a day (BID) | ORAL | 0 refills | Status: DC

## 2023-08-18 ENCOUNTER — Other Ambulatory Visit: Payer: Self-pay | Admitting: Internal Medicine

## 2023-09-16 ENCOUNTER — Other Ambulatory Visit: Payer: Self-pay | Admitting: Internal Medicine

## 2023-09-20 ENCOUNTER — Other Ambulatory Visit: Payer: Self-pay | Admitting: Internal Medicine

## 2023-09-27 ENCOUNTER — Other Ambulatory Visit: Payer: Self-pay | Admitting: Internal Medicine

## 2023-09-27 ENCOUNTER — Other Ambulatory Visit: Payer: Self-pay | Admitting: Student

## 2023-10-27 ENCOUNTER — Other Ambulatory Visit: Payer: Self-pay | Admitting: Student

## 2023-10-27 ENCOUNTER — Other Ambulatory Visit: Payer: Self-pay | Admitting: Internal Medicine

## 2023-10-30 NOTE — Telephone Encounter (Signed)
 This Pt is past her 3rd attempt. Does Dr.Taylor want to refill? Please advise

## 2023-12-08 ENCOUNTER — Other Ambulatory Visit: Payer: Self-pay | Admitting: Internal Medicine

## 2024-01-03 ENCOUNTER — Emergency Department (HOSPITAL_COMMUNITY)

## 2024-01-03 ENCOUNTER — Emergency Department (HOSPITAL_COMMUNITY)
Admission: EM | Admit: 2024-01-03 | Discharge: 2024-01-03 | Disposition: A | Discharge status: Home / Self Care | Attending: Emergency Medicine | Admitting: Emergency Medicine

## 2024-01-03 ENCOUNTER — Encounter (HOSPITAL_COMMUNITY): Payer: Self-pay | Admitting: Emergency Medicine

## 2024-01-03 ENCOUNTER — Other Ambulatory Visit: Payer: Self-pay

## 2024-01-03 DIAGNOSIS — Z7901 Long term (current) use of anticoagulants: Secondary | ICD-10-CM | POA: Insufficient documentation

## 2024-01-03 DIAGNOSIS — Z8679 Personal history of other diseases of the circulatory system: Secondary | ICD-10-CM

## 2024-01-03 DIAGNOSIS — R519 Headache, unspecified: Secondary | ICD-10-CM | POA: Insufficient documentation

## 2024-01-03 DIAGNOSIS — Z91148 Patient's other noncompliance with medication regimen for other reason: Secondary | ICD-10-CM | POA: Insufficient documentation

## 2024-01-03 DIAGNOSIS — I509 Heart failure, unspecified: Secondary | ICD-10-CM | POA: Insufficient documentation

## 2024-01-03 DIAGNOSIS — R0602 Shortness of breath: Secondary | ICD-10-CM | POA: Insufficient documentation

## 2024-01-03 DIAGNOSIS — I130 Hypertensive heart and chronic kidney disease with heart failure and stage 1 through stage 4 chronic kidney disease, or unspecified chronic kidney disease: Secondary | ICD-10-CM | POA: Insufficient documentation

## 2024-01-03 DIAGNOSIS — R6889 Other general symptoms and signs: Secondary | ICD-10-CM | POA: Diagnosis not present

## 2024-01-03 DIAGNOSIS — R0902 Hypoxemia: Secondary | ICD-10-CM | POA: Diagnosis not present

## 2024-01-03 DIAGNOSIS — R059 Cough, unspecified: Secondary | ICD-10-CM | POA: Insufficient documentation

## 2024-01-03 DIAGNOSIS — I499 Cardiac arrhythmia, unspecified: Secondary | ICD-10-CM | POA: Diagnosis not present

## 2024-01-03 DIAGNOSIS — N189 Chronic kidney disease, unspecified: Secondary | ICD-10-CM | POA: Insufficient documentation

## 2024-01-03 DIAGNOSIS — I4891 Unspecified atrial fibrillation: Secondary | ICD-10-CM | POA: Diagnosis not present

## 2024-01-03 DIAGNOSIS — Z79899 Other long term (current) drug therapy: Secondary | ICD-10-CM | POA: Diagnosis not present

## 2024-01-03 DIAGNOSIS — Z743 Need for continuous supervision: Secondary | ICD-10-CM | POA: Diagnosis not present

## 2024-01-03 DIAGNOSIS — I517 Cardiomegaly: Secondary | ICD-10-CM | POA: Diagnosis not present

## 2024-01-03 DIAGNOSIS — Z95810 Presence of automatic (implantable) cardiac defibrillator: Secondary | ICD-10-CM | POA: Diagnosis not present

## 2024-01-03 LAB — COMPREHENSIVE METABOLIC PANEL
ALT: 49 U/L — ABNORMAL HIGH (ref 0–44)
AST: 60 U/L — ABNORMAL HIGH (ref 15–41)
Albumin: 4.2 g/dL (ref 3.5–5.0)
Alkaline Phosphatase: 57 U/L (ref 38–126)
Anion gap: 16 — ABNORMAL HIGH (ref 5–15)
BUN: 23 mg/dL (ref 8–23)
CO2: 15 mmol/L — ABNORMAL LOW (ref 22–32)
Calcium: 9.3 mg/dL (ref 8.9–10.3)
Chloride: 100 mmol/L (ref 98–111)
Creatinine, Ser: 1.44 mg/dL — ABNORMAL HIGH (ref 0.44–1.00)
GFR, Estimated: 38 mL/min — ABNORMAL LOW (ref >60)
Glucose, Bld: 83 mg/dL (ref 70–99)
Potassium: 4.5 mmol/L (ref 3.5–5.1)
Sodium: 131 mmol/L — ABNORMAL LOW (ref 135–145)
Total Bilirubin: 1.1 mg/dL (ref 0.0–1.2)
Total Protein: 6.7 g/dL (ref 6.5–8.1)

## 2024-01-03 LAB — CBC
HCT: 30 % — ABNORMAL LOW (ref 36.0–46.0)
Hemoglobin: 9.9 g/dL — ABNORMAL LOW (ref 12.0–15.0)
MCH: 33.6 pg (ref 26.0–34.0)
MCHC: 33 g/dL (ref 30.0–36.0)
MCV: 101.7 fL — ABNORMAL HIGH (ref 80.0–100.0)
Platelets: 263 10*3/uL (ref 150–400)
RBC: 2.95 MIL/uL — ABNORMAL LOW (ref 3.87–5.11)
RDW: 13.3 % (ref 11.5–15.5)
WBC: 5.9 10*3/uL (ref 4.0–10.5)
nRBC: 0 % (ref 0.0–0.2)

## 2024-01-03 LAB — RESP PANEL BY RT-PCR (RSV, FLU A&B, COVID)  RVPGX2
Influenza A by PCR: NEGATIVE
Influenza B by PCR: NEGATIVE
Resp Syncytial Virus by PCR: NEGATIVE
SARS Coronavirus 2 by RT PCR: NEGATIVE

## 2024-01-03 LAB — BRAIN NATRIURETIC PEPTIDE: B Natriuretic Peptide: 1430.4 pg/mL — ABNORMAL HIGH (ref 0.0–100.0)

## 2024-01-03 MED ORDER — ENTRESTO 49-51 MG PO TABS: 1.0000 | ORAL_TABLET | Freq: Two times a day (BID) | ORAL | 0 refills | Status: AC

## 2024-01-03 MED ORDER — CARVEDILOL 6.25 MG PO TABS: 6.2500 mg | ORAL_TABLET | Freq: Two times a day (BID) | ORAL | 0 refills | Status: DC

## 2024-01-03 NOTE — ED Triage Notes (Signed)
 Pt BIB EMS for HA x 2 weeks. Pt reports being out of several medications. Pt reports onset of fatigue and decreased appetite x 2 days.

## 2024-01-03 NOTE — ED Provider Notes (Signed)
 [place] EMERGENCY DEPARTMENT AT [facility] Provider Note   CSN: [id number] Arrival date & time: 01/03/24  1613     History  Chief Complaint  Patient presents with   Headache   Shortness of Breath    [name] is a 78 y.o. female.  With a history of atrial fibrillation on Eliquis, heart failure, cardiomegaly, CKD, hypertension hyperlipidemia who presents to the ED for a headache.  Patient reports ongoing headache for the last [redacted] weeks along with increased generalized fatigue and decreased appetite for the last 2 days.  No falls, focal deficits, chest pain, shortness of breath, fevers or chills.  She reports that she has not been taking her Entresto or carvedilol as her prescriptions have run out.  Her granddaughter has made multiple attempts to schedule appointments with her cardiology office and PCP office however patient has called herself and canceled the appointment due to not feeling well.  This is why she does not have these 2 medications.  She has been taking all of her other medications.   Headache Shortness of Breath Associated symptoms: headaches        Home Medications Prior to Admission medications   Medication Sig Start Date End Date Taking? Authorizing Provider  acetaminophen (TYLENOL) 500 MG tablet Take 1,000 mg by mouth every 6 (six) hours as needed (back/neck pain). For pain    [provider]  amiodarone (PACERONE) 200 MG tablet TAKE ONE TABLET BY MOUTH DAILY MONDAY THROUGH SATURDAY. TAKE TWO TABLETS BY MOUTH SATURDAY AND SUNDAY. 12/08/23   Marinus Maw, MD  carvedilol (COREG) 6.25 MG tablet Take 1 tablet (6.25 mg total) by mouth 2 (two) times daily for 14 days. 01/03/24 01/17/24  Royanne Foots, DO  ELIQUIS 5 MG TABS tablet Take 5 mg by mouth 2 (two) times daily. 03/30/22   [provider]  gabapentin (NEURONTIN) 100 MG capsule 100 mg 2 (two) times daily. 11/17/21   [provider]  Glycerin-Hypromellose-PEG 400 (VISINE DRY  EYE) 0.2-0.2-1 % SOLN Place 1-2 drops into both eyes 3 (three) times daily as needed (dry/irritated eyes).    [provider]  levothyroxine (SYNTHROID) 125 MCG tablet Take 125 mcg by mouth daily. 07/08/22   [provider]  Multiple Vitamin (MULTIVITAMIN WITH MINERALS) TABS tablet Take 1 tablet by mouth daily. 01/24/21   Pokhrel, Rebekah Chesterfield, MD  ondansetron (ZOFRAN) 4 MG tablet Take 1 tablet (4 mg total) by mouth daily as needed for nausea or vomiting. Patient not taking: Reported on 07/21/2022 12/25/21   Marrion Coy, MD  oxyCODONE-acetaminophen (PERCOCET/ROXICET) 5-325 MG tablet Take 1 tablet by mouth every 6 (six) hours as needed for severe pain. 07/21/23   Cuthriell, Delorise Royals, PA-C  PARoxetine (PAXIL) 30 MG tablet Take 60 mg by mouth every evening.    [provider]  Polyvinyl Alcohol-Povidone (MURINE TEARS FOR DRY EYES OP) Place 2 drops into both eyes 2 (two) times daily as needed. For dry eyes    [provider]  pravastatin (PRAVACHOL) 40 MG tablet TAKE 1 TABLET BY MOUTH EVERY DAY IN THE EVENING 09/20/23   Marinus Maw, MD  sacubitril-valsartan (ENTRESTO) 49-51 MG Take 1 tablet by mouth 2 (two) times daily for 14 days. 01/03/24 01/17/24  Royanne Foots, DO  sodium chloride (OCEAN) 0.65 % SOLN nasal spray Place 1 spray into both nostrils as needed (dry nasal passages).    [provider]  spironolactone (ALDACTONE) 25 MG tablet Take 12.5 mg by mouth daily.  01/18/22   [provider]      Allergies    Morphine and codeine, Codeine, Demerol [meperidine hcl], Meperidine hcl, and Pollen extract    Review of Systems   Review of Systems  Respiratory:  Positive for shortness of breath.   Neurological:  Positive for headaches.    Physical Exam Updated Vital Signs BP (!) 137/92 (BP Location: Left Arm)   Pulse 100   Temp 98.1 F (36.7 C) (Oral)   Resp 15   SpO2 97% Comment: Simultaneous filing. User may not have seen previous data. Physical  Exam Vitals and nursing note reviewed.  HENT:     Head: Normocephalic and atraumatic.  Eyes:     Pupils: Pupils are equal, round, and reactive to light.  Cardiovascular:     Rate and Rhythm: Normal rate and regular rhythm.  Pulmonary:     Effort: Pulmonary effort is normal.     Breath sounds: Normal breath sounds.  Abdominal:     Palpations: Abdomen is soft.     Tenderness: There is no abdominal tenderness.  Skin:    General: Skin is warm and dry.  Neurological:     Mental Status: She is alert.     Sensory: No sensory deficit.     Motor: No weakness.  Psychiatric:        Mood and Affect: Mood normal.     ED Results / Procedures / Treatments   Labs (all labs ordered are listed, but only abnormal results are displayed) Labs Reviewed  COMPREHENSIVE METABOLIC PANEL - Abnormal; Notable for the following components:      Result Value   Sodium 131 (*)    CO2 15 (*)    Creatinine, Ser 1.44 (*)    AST 60 (*)    ALT 49 (*)    GFR, Estimated 38 (*)    Anion gap 16 (*)    All other components within normal limits  BRAIN NATRIURETIC PEPTIDE - Abnormal; Notable for the following components:   B Natriuretic Peptide 1,430.4 (*)    All other components within normal limits  CBC - Abnormal; Notable for the following components:   RBC 2.95 (*)    Hemoglobin 9.9 (*)    HCT 30.0 (*)    MCV 101.7 (*)    All other components within normal limits  RESP PANEL BY RT-PCR (RSV, FLU A&B, COVID)  [id number]    EKG None 12-lead ECG interpreted independently by me Atrial fibrillation at 101 bpm   Radiology DG Chest Port 1 View Result Date: 01/03/2024 CLINICAL DATA:  Shortness of breath EXAM: PORTABLE CHEST 1 VIEW COMPARISON:  07/21/2023 FINDINGS: Right AICD remains in place, unchanged. Prior valve replacement. Cardiomegaly. No confluent opacities, effusions or edema. No acute bony abnormality. IMPRESSION: Cardiomegaly.  No active disease. Electronically Signed   By: Charlett Nose M.D.   On:  01/03/2024 18:14    Procedures Procedures    Medications Ordered in ED Medications - No data to display  ED Course/ Medical Decision Making/ A&P                                 Medical Decision Making 78 year old female with history as above presenting for subacute headaches and medication refills.  2 weeks of dull headaches that come and go made worse with coughing.  No other neurologic symptoms.  No deficits on my exam.  Is out of her Sherryll Burger and  carvedilol because she has not been going to her appointments that have been scheduled by her granddaughter to see her cardiologist and PCP.  Differential diagnosis includes heart failure exacerbation, pneumonia, anemia, electrolyte imbalance.    Chest x-ray shows no focal consolidation or severe congestion.  BNP elevated but actually lower than her baseline.  Rest of her labs appear at baseline as well.  EKG shows A-fib which she has a history of.  Rate controlled and she has been taking the Eliquis  I discussed the importance of following up with her cardiology and PCP office.  I will prescribe her short course of refills for Entresto and carvedilol as I do not want her to be out of these medications for too long but she understands the importance and her granddaughter will call to schedule appointments for her  Stable for discharge  Amount and/or Complexity of Data Reviewed Labs: ordered. Radiology: ordered.  Risk Prescription drug management.           Final Clinical Impression(s) / ED Diagnoses Final diagnoses:  Nonintractable episodic headache, unspecified headache type  History of heart failure    Rx / DC Orders ED Discharge Orders          Ordered    carvedilol (COREG) 6.25 MG tablet  2 times daily       Note to Pharmacy: Patient need to schedule an appointment for future refills. 3 Attempt   01/03/24 1843    sacubitril-valsartan (ENTRESTO) 49-51 MG  2 times daily       Note to Pharmacy: Please call office to  schedule overdue appt with cardiologist for further refills. [phone] Thank you 3rd & Final Attempt   01/03/24 1843              Royanne Foots, DO 01/03/24 [place]

## 2024-01-03 NOTE — Discharge Instructions (Signed)
 You were seen in the emergency room for weakness and ongoing headaches Your blood work chest x-ray and EKG all looked okay We have called in 2 weeks worth of refills for your carvedilol and Entresto The reason these medications were not refilled is that you have not been seen by your cardiology team in the office It is very important that you follow-up with your cardiology team and primary care within the next week for reevaluation in the office Return to the emerged part for trouble breathing chest pain, severe headaches or any other concerns

## 2024-02-21 NOTE — Progress Notes (Unsigned)
  Electrophysiology Office Note:   ID:  [name], DOB 03-26-1946, MRN [id number]  Primary Cardiologist: None Electrophysiologist: None *** {Click to update primary MD,subspecialty MD or APP then REFRESH:1}    History of Present Illness:   [name] is a 78 y.o. female with h/o *** seen today for {VISITTYPE:28148}  Review of systems complete and found to be negative unless listed in HPI.   EP Information / Studies Reviewed:    {EKGtoday:28818}       ICD Interrogation-  reviewed in detail today,  See PACEART report.  Arrhythmia/Device History Abbott single chamber ICD implanted 09/09/2009, gen change 01/2022   Physical Exam:   VS:  There were no vitals taken for this visit.   Wt Readings from Last 3 Encounters:  07/21/23 153 lb (69.4 kg)  07/21/22 151 lb (68.5 kg)  04/27/22 156 lb (70.8 kg)     GEN: No acute distress *** NECK: No JVD; No carotid bruits CARDIAC: {EPRHYTHM:28826}, no murmurs, rubs, gallops RESPIRATORY:  Clear to auscultation without rales, wheezing or rhonchi  ABDOMEN: Soft, non-tender, non-distended EXTREMITIES:  {EDEMA LEVEL:28147::"No"} edema; No deformity   ASSESSMENT AND PLAN:    Aborted Cardiac Arrest  s/p Abbott single chamber ICD  euvolemic today Stable on an appropriate medical regimen Normal ICD function See Pace Art report No changes today  NICM Chronic CHF EF 30-35% 01/2021  Disposition:   Follow up with {EPPROVIDERS:28135} {EPFOLLOW UP:28173}   Signed, Tylene Galla, PA-C

## 2024-02-21 NOTE — H&P (View-Only) (Signed)
  Electrophysiology Office Note:   ID:  [name], DOB 03-26-1946, MRN [id number]  Primary Cardiologist: None Electrophysiologist: None *** {Click to update primary MD,subspecialty MD or APP then REFRESH:1}    History of Present Illness:   [name] is a 78 y.o. female with h/o *** seen today for {VISITTYPE:28148}  Review of systems complete and found to be negative unless listed in HPI.   EP Information / Studies Reviewed:    {EKGtoday:28818}       ICD Interrogation-  reviewed in detail today,  See PACEART report.  Arrhythmia/Device History Abbott single chamber ICD implanted 09/09/2009, gen change 01/2022   Physical Exam:   VS:  There were no vitals taken for this visit.   Wt Readings from Last 3 Encounters:  07/21/23 153 lb (69.4 kg)  07/21/22 151 lb (68.5 kg)  04/27/22 156 lb (70.8 kg)     GEN: No acute distress *** NECK: No JVD; No carotid bruits CARDIAC: {EPRHYTHM:28826}, no murmurs, rubs, gallops RESPIRATORY:  Clear to auscultation without rales, wheezing or rhonchi  ABDOMEN: Soft, non-tender, non-distended EXTREMITIES:  {EDEMA LEVEL:28147::"No"} edema; No deformity   ASSESSMENT AND PLAN:    Aborted Cardiac Arrest  s/p Abbott single chamber ICD  euvolemic today Stable on an appropriate medical regimen Normal ICD function See Pace Art report No changes today  NICM Chronic CHF EF 30-35% 01/2021  Disposition:   Follow up with {EPPROVIDERS:28135} {EPFOLLOW UP:28173}   Signed, Tylene Galla, PA-C

## 2024-02-22 ENCOUNTER — Encounter: Payer: Self-pay | Admitting: *Deleted

## 2024-02-22 ENCOUNTER — Telehealth: Payer: Self-pay | Admitting: Internal Medicine

## 2024-02-22 ENCOUNTER — Ambulatory Visit: Attending: Student | Admitting: Student

## 2024-02-22 ENCOUNTER — Encounter: Payer: Self-pay | Admitting: Student

## 2024-02-22 VITALS — BP 146/82 | HR 97 | Ht 64.0 in | Wt 160.0 lb

## 2024-02-22 DIAGNOSIS — I480 Paroxysmal atrial fibrillation: Secondary | ICD-10-CM | POA: Diagnosis not present

## 2024-02-22 DIAGNOSIS — I10 Essential (primary) hypertension: Secondary | ICD-10-CM

## 2024-02-22 DIAGNOSIS — Z79899 Other long term (current) drug therapy: Secondary | ICD-10-CM | POA: Diagnosis not present

## 2024-02-22 DIAGNOSIS — I5022 Chronic systolic (congestive) heart failure: Secondary | ICD-10-CM | POA: Diagnosis not present

## 2024-02-22 DIAGNOSIS — Z9189 Other specified personal risk factors, not elsewhere classified: Secondary | ICD-10-CM

## 2024-02-22 DIAGNOSIS — Z95810 Presence of automatic (implantable) cardiac defibrillator: Secondary | ICD-10-CM | POA: Diagnosis not present

## 2024-02-22 DIAGNOSIS — I4901 Ventricular fibrillation: Secondary | ICD-10-CM | POA: Diagnosis not present

## 2024-02-22 LAB — CUP PACEART INCLINIC DEVICE CHECK
Battery Remaining Longevity: 82 mo
Brady Statistic RV Percent Paced: 1.1 %
Date Time Interrogation Session: 20250508134745
HighPow Impedance: 35.0744
Implantable Lead Connection Status: 753985
Implantable Lead Implant Date: 20101124
Implantable Lead Location: 753860
Implantable Pulse Generator Implant Date: 20230419
Lead Channel Impedance Value: 337.5 Ohm
Lead Channel Pacing Threshold Amplitude: 1 V
Lead Channel Pacing Threshold Amplitude: 1 V
Lead Channel Pacing Threshold Pulse Width: 0.5 ms
Lead Channel Pacing Threshold Pulse Width: 0.5 ms
Lead Channel Sensing Intrinsic Amplitude: 8.8 mV
Lead Channel Setting Pacing Amplitude: 2 V
Lead Channel Setting Pacing Pulse Width: 0.5 ms
Lead Channel Setting Sensing Sensitivity: 0.5 mV
Pulse Gen Serial Number: 9995581
Zone Setting Status: 755011

## 2024-02-22 MED ORDER — CARVEDILOL 6.25 MG PO TABS: 6.2500 mg | ORAL_TABLET | Freq: Two times a day (BID) | ORAL | 6 refills | Status: DC

## 2024-02-22 MED ORDER — ELIQUIS 5 MG PO TABS: 5.0000 mg | ORAL_TABLET | Freq: Two times a day (BID) | ORAL | 6 refills | Status: DC

## 2024-02-22 MED ORDER — AMIODARONE HCL 200 MG PO TABS: 200.0000 mg | ORAL_TABLET | Freq: Every day | ORAL | 6 refills | Status: DC

## 2024-02-22 NOTE — Telephone Encounter (Signed)
 Patient identification verified by 2 forms. Sims Duck, RN     Called and spoke to patient's granddaughter Miranda.  Miranda states:  - patient out of entresto  and needs refill - prescription was being filled by Dr. Timoteo Force office but patient unable to access his office for a visit, therefore unable to get a refill.   Patient denies:              Interventions/Plan: - Entresto  last d/c in 01/2022. Not addressed on current on med list for OV today.  - Encounter routed to cardiologist for clarification.   Miranda agrees with plan, no questions at this time

## 2024-02-22 NOTE — Telephone Encounter (Signed)
 Pt c/o medication issue:  1. Name of Medication:    2. How are you currently taking this medication (dosage and times per day)? As written  3. Are you having a reaction (difficulty breathing--STAT)? No  4. What is your medication issue? Patient's granddaughter is calling stating that the patient is needing a refill sent with a 30 day supply. This medication is no longer listed on the patient's current medication list. Patient is out of medication. Please advise.

## 2024-02-22 NOTE — Patient Instructions (Signed)
 Medication Instructions:  Your physician recommends that you continue on your current medications as directed. Please refer to the Current Medication list given to you today.  *If you need a refill on your cardiac medications before your next appointment, please call your pharmacy*  Lab Work: BMET, CBC, TSH, FreeT4-TODAY If you have labs (blood work) drawn today and your tests are completely normal, you will receive your results only by: MyChart Message (if you have MyChart) OR A paper copy in the mail If you have any lab test that is abnormal or we need to change your treatment, we will call you to review the results.  Testing/Procedures: Your physician has recommended that you have a Cardioversion (DCCV). Electrical Cardioversion uses a jolt of electricity to your heart either through paddles or wired patches attached to your chest. This is a controlled, usually prescheduled, procedure. Defibrillation is done under light anesthesia in the hospital, and you usually go home the day of the procedure. This is done to get your heart back into a normal rhythm. You are not awake for the procedure. Please see the instruction sheet given to you today.   Follow-Up: At [facility], you and your health needs are our priority.  As part of our continuing mission to provide you with exceptional heart care, our providers are all part of one team.  This team includes your primary Cardiologist (physician) and Advanced Practice Providers or APPs (Physician Assistants and Nurse Practitioners) who all work together to provide you with the care you need, when you need it.  Your next appointment:   A few weeks after cardioversion  Provider:   Bambi Lever "Jonelle Neri" Tillery, PA-C    You have been referred to see general cardiology.

## 2024-02-23 LAB — BASIC METABOLIC PANEL WITH GFR
BUN/Creatinine Ratio: 14 (ref 12–28)
BUN: 18 mg/dL (ref 8–27)
CO2: 19 mmol/L — ABNORMAL LOW (ref 20–29)
Calcium: 9.5 mg/dL (ref 8.7–10.3)
Chloride: 100 mmol/L (ref 96–106)
Creatinine, Ser: 1.28 mg/dL — ABNORMAL HIGH (ref 0.57–1.00)
Glucose: 88 mg/dL (ref 70–99)
Potassium: 4.8 mmol/L (ref 3.5–5.2)
Sodium: 137 mmol/L (ref 134–144)
eGFR: 43 mL/min/{1.73_m2} — ABNORMAL LOW (ref >59)

## 2024-02-23 LAB — CBC
Hematocrit: 28.2 % — ABNORMAL LOW (ref 34.0–46.6)
Hemoglobin: 9 g/dL — ABNORMAL LOW (ref 11.1–15.9)
MCH: 32.6 pg (ref 26.6–33.0)
MCHC: 31.9 g/dL (ref 31.5–35.7)
MCV: 102 fL — ABNORMAL HIGH (ref 79–97)
Platelets: 227 10*3/uL (ref 150–450)
RBC: 2.76 x10E6/uL — ABNORMAL LOW (ref 3.77–5.28)
RDW: 13.8 % (ref 11.7–15.4)
WBC: 6.9 10*3/uL (ref 3.4–10.8)

## 2024-02-24 LAB — TSH: TSH: 3 u[IU]/mL (ref 0.450–4.500)

## 2024-02-24 LAB — T4, FREE: Free T4: 2.07 ng/dL — ABNORMAL HIGH (ref 0.82–1.77)

## 2024-02-29 DIAGNOSIS — I10 Essential (primary) hypertension: Secondary | ICD-10-CM | POA: Diagnosis not present

## 2024-02-29 DIAGNOSIS — I482 Chronic atrial fibrillation, unspecified: Secondary | ICD-10-CM | POA: Diagnosis not present

## 2024-02-29 DIAGNOSIS — E782 Mixed hyperlipidemia: Secondary | ICD-10-CM | POA: Diagnosis not present

## 2024-02-29 DIAGNOSIS — E039 Hypothyroidism, unspecified: Secondary | ICD-10-CM | POA: Diagnosis not present

## 2024-03-10 NOTE — Telephone Encounter (Signed)
 Ok to refill coreg , one years worth of refills. Either one month or 3 month, patient preferance. GT

## 2024-03-13 NOTE — Progress Notes (Signed)
 Called patient with pre-procedure instructions for tomorrow. Spoke with patient's [employer].  Patient informed of:   Time to arrive for procedure. 1200 Remain NPO past midnight.  Must have a ride home and a responsible adult to remain with them for 24 hours post procedure.  Confirmed blood thinner. Eliquis  Confirmed no breaks in taking blood thinner for 3+ weeks prior to procedure. Confirmed patient stopped all GLP-1s and GLP-2s for at least one week before procedure.

## 2024-03-14 ENCOUNTER — Encounter (HOSPITAL_COMMUNITY)
Admission: RE | Disposition: A | Discharge status: Home / Self Care | Payer: Self-pay | Source: Home / Self Care | Attending: Cardiovascular Disease

## 2024-03-14 ENCOUNTER — Ambulatory Visit (HOSPITAL_COMMUNITY)
Admission: RE | Admit: 2024-03-14 | Discharge: 2024-03-14 | Disposition: A | Discharge status: Home / Self Care | Attending: Cardiovascular Disease | Admitting: Cardiovascular Disease

## 2024-03-14 ENCOUNTER — Ambulatory Visit (HOSPITAL_COMMUNITY): Admitting: Anesthesiology

## 2024-03-14 ENCOUNTER — Other Ambulatory Visit: Payer: Self-pay

## 2024-03-14 DIAGNOSIS — I5022 Chronic systolic (congestive) heart failure: Secondary | ICD-10-CM

## 2024-03-14 DIAGNOSIS — E785 Hyperlipidemia, unspecified: Secondary | ICD-10-CM | POA: Diagnosis not present

## 2024-03-14 DIAGNOSIS — I110 Hypertensive heart disease with heart failure: Secondary | ICD-10-CM | POA: Insufficient documentation

## 2024-03-14 DIAGNOSIS — I4891 Unspecified atrial fibrillation: Secondary | ICD-10-CM | POA: Diagnosis not present

## 2024-03-14 DIAGNOSIS — I509 Heart failure, unspecified: Secondary | ICD-10-CM | POA: Diagnosis not present

## 2024-03-14 DIAGNOSIS — I4819 Other persistent atrial fibrillation: Secondary | ICD-10-CM | POA: Insufficient documentation

## 2024-03-14 DIAGNOSIS — Z8674 Personal history of sudden cardiac arrest: Secondary | ICD-10-CM | POA: Diagnosis not present

## 2024-03-14 DIAGNOSIS — I130 Hypertensive heart and chronic kidney disease with heart failure and stage 1 through stage 4 chronic kidney disease, or unspecified chronic kidney disease: Secondary | ICD-10-CM

## 2024-03-14 DIAGNOSIS — N1831 Chronic kidney disease, stage 3a: Secondary | ICD-10-CM | POA: Diagnosis not present

## 2024-03-14 DIAGNOSIS — I428 Other cardiomyopathies: Secondary | ICD-10-CM | POA: Diagnosis not present

## 2024-03-14 DIAGNOSIS — E039 Hypothyroidism, unspecified: Secondary | ICD-10-CM | POA: Diagnosis not present

## 2024-03-14 DIAGNOSIS — Z79899 Other long term (current) drug therapy: Secondary | ICD-10-CM | POA: Insufficient documentation

## 2024-03-14 DIAGNOSIS — Z7901 Long term (current) use of anticoagulants: Secondary | ICD-10-CM | POA: Diagnosis not present

## 2024-03-14 DIAGNOSIS — Z95810 Presence of automatic (implantable) cardiac defibrillator: Secondary | ICD-10-CM | POA: Insufficient documentation

## 2024-03-14 DIAGNOSIS — I480 Paroxysmal atrial fibrillation: Secondary | ICD-10-CM

## 2024-03-14 HISTORY — PX: CARDIOVERSION: EP1203

## 2024-03-14 SURGERY — CARDIOVERSION (CATH LAB)
Anesthesia: General

## 2024-03-14 MED ORDER — SODIUM CHLORIDE 0.9 % IV SOLN: INTRAVENOUS | Status: DC

## 2024-03-14 MED ORDER — LIDOCAINE 2% (20 MG/ML) 5 ML SYRINGE
INTRAMUSCULAR | Status: DC | PRN
  Administered 2024-03-14: 60 mg via INTRAVENOUS

## 2024-03-14 MED ORDER — PROPOFOL 10 MG/ML IV BOLUS
INTRAVENOUS | Status: DC | PRN
  Administered 2024-03-14: 40 mg via INTRAVENOUS

## 2024-03-14 SURGICAL SUPPLY — 1 items: PAD DEFIB RADIO PHYSIO CONN (PAD) ×1 IMPLANT

## 2024-03-14 NOTE — Transfer of Care (Signed)
 Immediate Anesthesia Transfer of Care Note  Patient: [name]  Procedure(s) Performed: CARDIOVERSION  Patient Location: PACU  Anesthesia Type:MAC  Level of Consciousness: sedated  Airway & Oxygen Therapy: Patient Spontanous Breathing  Post-op Assessment: Report given to RN  Post vital signs: Reviewed and stable  Last Vitals:  Vitals Value Taken Time  BP 116/54   Temp    Pulse 87 03/14/24 1234  Resp 16 03/14/24 1234  SpO2 97 % 03/14/24 1234  Vitals shown include unfiled device data.  Last Pain:  Vitals:   03/14/24 1224  TempSrc:   PainSc: 0-No pain         Complications: No notable events documented.

## 2024-03-14 NOTE — Anesthesia Preprocedure Evaluation (Addendum)
 Anesthesia Evaluation  Patient identified by MRN, date of birth, ID band Patient awake    Reviewed: Allergy & Precautions, H&P , NPO status , Patient's Chart, lab work & pertinent test results, reviewed documented beta blocker date and time   Airway Mallampati: III  TM Distance: >3 FB Neck ROM: Full    Dental no notable dental hx. (+) Teeth Intact, Dental Advisory Given   Pulmonary neg pulmonary ROS   Pulmonary exam normal breath sounds clear to auscultation       Cardiovascular hypertension, Pt. on medications and Pt. on home beta blockers +CHF  + dysrhythmias Atrial Fibrillation + Cardiac Defibrillator + Valvular Problems/Murmurs  Rhythm:Irregular Rate:Normal     Neuro/Psych  Headaches  Anxiety Depression       GI/Hepatic negative GI ROS, Neg liver ROS,,,  Endo/Other  Hypothyroidism    Renal/GU negative Renal ROS  negative genitourinary   Musculoskeletal  (+) Arthritis ,    Abdominal   Peds  Hematology  (+) Blood dyscrasia, anemia   Anesthesia Other Findings   Reproductive/Obstetrics negative OB ROS                             Anesthesia Physical Anesthesia Plan  ASA: 3  Anesthesia Plan: General   Post-op Pain Management: Minimal or no pain anticipated   Induction: Intravenous  PONV Risk Score and Plan: 3 and Propofol  infusion and Treatment may vary due to age or medical condition  Airway Management Planned: Natural Airway and Simple Face Mask  Additional Equipment:   Intra-op Plan:   Post-operative Plan:   Informed Consent: I have reviewed the patients History and Physical, chart, labs and discussed the procedure including the risks, benefits and alternatives for the proposed anesthesia with the patient or authorized representative who has indicated his/her understanding and acceptance.     Dental advisory given  Plan Discussed with: CRNA  Anesthesia Plan Comments:         Anesthesia Quick Evaluation

## 2024-03-14 NOTE — Interval H&P Note (Signed)
 History and Physical Interval Note:  03/14/2024 12:39 PM  [name]  has presented today for surgery, with the diagnosis of AFIB.  The various methods of treatment have been discussed with the patient and family. After consideration of risks, benefits and other options for treatment, the patient has consented to  Procedure(s): CARDIOVERSION (N/A) as a surgical intervention.  The patient's history has been reviewed, patient examined, no change in status, stable for surgery.  I have reviewed the patient's chart and labs.  Questions were answered to the patient's satisfaction.     [name], MD

## 2024-03-14 NOTE — Discharge Instructions (Signed)

## 2024-03-14 NOTE — CV Procedure (Signed)
 Electrical Cardioversion Procedure Note [name] [id number] 12-Jul-1946  Procedure: Electrical Cardioversion Indications:  Atrial Fibrillation  Procedure Details Consent: Risks of procedure as well as the alternatives and risks of each were explained to the (patient/caregiver).  Consent for procedure obtained. Time Out: Verified patient identification, verified procedure, site/side was marked, verified correct patient position, special equipment/implants available, medications/allergies/relevent history reviewed, required imaging and test results available.  Performed  Patient placed on cardiac monitor, pulse oximetry, supplemental oxygen as necessary.  Sedation given: propofol  Pacer pads placed anterior and posterior chest.  Cardioverted 1 time(s).  Cardioverted at 200J.  Evaluation Findings: Post procedure EKG shows: NSR Complications: None Patient did tolerate procedure well.   [name], MD 03/14/2024, 12:49 PM

## 2024-03-14 NOTE — Anesthesia Postprocedure Evaluation (Signed)
 Anesthesia Post Note  Patient: [name]  Procedure(s) Performed: CARDIOVERSION     Patient location during evaluation: Cath Lab Anesthesia Type: General Level of consciousness: awake and alert Pain management: pain level controlled Vital Signs Assessment: post-procedure vital signs reviewed and stable Respiratory status: spontaneous breathing, nonlabored ventilation and respiratory function stable Cardiovascular status: blood pressure returned to baseline and stable Postop Assessment: no apparent nausea or vomiting Anesthetic complications: no  No notable events documented.  Last Vitals:  Vitals:   03/14/24 1303 03/14/24 1313  BP: (!) 118/54 (!) 112/55  Pulse: (!) 58 (!) 56  Resp: 20 20  Temp:    SpO2: 95% 98%    Last Pain:  Vitals:   03/14/24 1313  TempSrc:   PainSc: 0-No pain                 [name],[name]

## 2024-03-15 ENCOUNTER — Encounter (HOSPITAL_COMMUNITY): Payer: Self-pay | Admitting: Cardiovascular Disease

## 2024-03-21 NOTE — Telephone Encounter (Signed)
 Yes to the entresto  as well. She will need a general cardiologist.

## 2024-03-25 ENCOUNTER — Ambulatory Visit: Attending: Internal Medicine | Admitting: Internal Medicine

## 2024-03-25 VITALS — BP 144/70 | HR 61 | Ht 64.0 in | Wt 138.0 lb

## 2024-03-25 DIAGNOSIS — I4891 Unspecified atrial fibrillation: Secondary | ICD-10-CM | POA: Diagnosis not present

## 2024-03-25 NOTE — Patient Instructions (Signed)
 Medication Instructions:  Your physician recommends that you continue on your current medications as directed. Please refer to the Current Medication list given to you today.  *If you need a refill on your cardiac medications before your next appointment, please call your pharmacy*  Lab Work: None ordered.  You may go to any [facility] Location for your lab work:  [facility] - [address] ([facility]) - [address] [id number] [address]  [place] - [address]   [place]  - [address]   [place] - [address] - [address] [employer]  - [address] ([facility]   If you have labs (blood work) drawn today and your tests are completely normal, you will receive your results only by: [employer] (if you have MyChart)  If you have any lab test that is abnormal or we need to change your treatment, we will call you or send a MyChart message to review the results.  Testing/Procedures: None ordered.  Follow-Up: At [facility], you and your health needs are our priority.  As part of our continuing mission to provide you with exceptional heart care, we have created designated Provider Care Teams.  These Care Teams include your primary Cardiologist (physician) and Advanced Practice Providers (APPs -  Physician Assistants and Nurse Practitioners) who all work together to provide you with the care you need, when you need it.  Your next appointment:   1 year(s)  The format for your next appointment:   In Person  Provider:   Manya Sells, MD{or one of the following Advanced Practice Providers on your designated Care Team:   [name], [place] [name] "[name]" [place], [place] Jessica Balk, NP  Note: Remote monitoring is used to monitor your Pacemaker/ ICD from home. This monitoring reduces the number of office visits required to check your  device to one time per year. It allows us  to keep an eye on the functioning of your device to ensure it is working properly.

## 2024-03-25 NOTE — Progress Notes (Signed)
 HPI [name] returns today for followup. She is a pleasant 78 yo woman with a h/o chronic systolic heart failure, VF arrest s/p ICD insertion, mitral regurge, and chronic amio therapy for prevention of her ventricular arrhythmias. She also has developed atrial fib which has become permanent despite being on low dose amiodarone . No fever or chills. We increased her dose and had her come in about 4 weeks ago for DCCV. She has done well in the interim. She remains on 200 bid of amiodarone . Allergies  Allergen Reactions   Morphine  And Codeine Itching and Nausea And Vomiting   Codeine Nausea And Vomiting   Demerol [Meperidine Hcl] Nausea And Vomiting   Meperidine Hcl Nausea And Vomiting   Pollen Extract Itching and Other (See Comments)    Seasonal allergies     Current Outpatient Medications  Medication Sig Dispense Refill   acetaminophen  (TYLENOL ) 500 MG tablet Take 1,000 mg by mouth every 6 (six) hours as needed (back/neck pain). For pain     amiodarone  (PACERONE ) 200 MG tablet Take 1 tablet (200 mg total) by mouth daily. (Patient taking differently: Take 200 mg by mouth 2 (two) times daily.) 30 tablet 6   carvedilol  (COREG ) 6.25 MG tablet Take 1 tablet (6.25 mg total) by mouth 2 (two) times daily. 60 tablet 6   ELIQUIS  5 MG TABS tablet Take 1 tablet (5 mg total) by mouth 2 (two) times daily. 60 tablet 6   ENTRESTO  49-51 MG Take 1 tablet by mouth 2 (two) times daily.     gabapentin  (NEURONTIN ) 100 MG capsule Take 200 mg by mouth at bedtime.     Glycerin-Hypromellose-PEG 400 (VISINE DRY EYE) 0.2-0.2-1 % SOLN Place 1-2 drops into both eyes 3 (three) times daily as needed (dry/irritated eyes).     levothyroxine  (SYNTHROID ) 100 MCG tablet Take 100 mcg by mouth daily before breakfast.     oxyCODONE -acetaminophen  (PERCOCET/ROXICET) 5-325 MG tablet Take 1 tablet by mouth every 6 (six) hours as needed for severe pain. 16 tablet 0   PARoxetine  (PAXIL ) 30 MG tablet Take 60 mg by mouth every  evening.     pravastatin  (PRAVACHOL ) 40 MG tablet TAKE 1 TABLET BY MOUTH EVERY DAY IN THE EVENING 15 tablet 0   sodium chloride  (OCEAN) 0.65 % SOLN nasal spray Place 1 spray into both nostrils as needed (dry nasal passages).     spironolactone  (ALDACTONE ) 25 MG tablet Take 12.5 mg by mouth daily.     No current facility-administered medications for this visit.     Past Medical History:  Diagnosis Date   Anxiety    Arthritis    Automatic implantable cardioverter-defibrillator in situ    Cardiomegaly    CHF (congestive heart failure) (HCC) 05/2013   class III, severe   Chronic bronchitis (HCC)    Depression    GERD (gastroesophageal reflux disease)    Headache(784.0)    migraines   Heart murmur    History of sudden cardiac arrest successfully resuscitated 2011   HLD (hyperlipidemia)    Hypertension    Hypothyroidism    IBS (irritable bowel syndrome)    Paroxysmal atrial fibrillation (HCC)    Pneumonia    Renal disorder    Scoliosis    Severe mitral regurgitation 05/2013   s/p MV repair with annuloplasty    ROS:   All systems reviewed and negative except as noted in the HPI.   Past Surgical History:  Procedure Laterality Date   ABDOMINAL HYSTERECTOMY  APPENDECTOMY     CARDIAC CATHETERIZATION     CARDIAC DEFIBRILLATOR PLACEMENT  2010   Vfib arrest, single chamber ICD   CARDIOVERSION N/A 04/27/2022   Procedure: CARDIOVERSION;  Surgeon: Sonny Dust, MD;  Location: [facility] ENDOSCOPY;  Service: Cardiovascular;  Laterality: N/A;   CARDIOVERSION N/A 03/14/2024   Procedure: CARDIOVERSION;  Surgeon: [name], MD;  Location: [facility] INVASIVE CV LAB;  Service: Cardiovascular;  Laterality: N/A;   CARPAL TUNNEL RELEASE Right 03/05/2014   Procedure: RIGHT CARPAL TUNNEL RELEASE;  Surgeon: Hedy Living, MD;  Location: [facility] OR;  Service: Orthopedics;  Laterality: Right;   CATARACT EXTRACTION W/PHACO Left 09/03/2020   Procedure: CATARACT EXTRACTION PHACO AND INTRAOCULAR  LENS PLACEMENT (IOC) LEFT;  Surgeon: Clair Crews, MD;  Location: [facility] ORS;  Service: Ophthalmology;  Laterality: Left;  Lot # [id number] H US : 01:04.1 CDE:11:62    CATARACT EXTRACTION W/PHACO Right 12/31/2020   Procedure: CATARACT EXTRACTION PHACO AND INTRAOCULAR LENS PLACEMENT (IOC) RIGHT;  Surgeon: Clair Crews, MD;  Location: [facility] ORS;  Service: Ophthalmology;  Laterality: Right;  Lot #[id number] H US : 00:55.8 CDE: 6.29   COLONOSCOPY     FRACTURE SURGERY     HERNIA REPAIR  as child   x 2   ICD GENERATOR CHANGEOUT N/A 02/02/2022   Procedure: ICD GENERATOR CHANGEOUT;  Surgeon: Tammie Fall, MD;  Location: [facility] INVASIVE CV LAB;  Service: Cardiovascular;  Laterality: N/A;   INTRAOPERATIVE TRANSESOPHAGEAL ECHOCARDIOGRAM N/A 05/24/2013   Procedure: INTRAOPERATIVE TRANSESOPHAGEAL ECHOCARDIOGRAM;  Surgeon: Heriberto London, MD;  Location: [facility] OR;  Service: Open Heart Surgery;  Laterality: N/A;   LEFT HEART CATHETERIZATION WITH CORONARY ANGIOGRAM N/A 05/16/2013   Procedure: LEFT HEART CATHETERIZATION WITH CORONARY ANGIOGRAM;  Surgeon: Sharene Dauer, MD;  Location: [facility] CATH LAB;  Service: Cardiovascular;  Laterality: N/A;   MITRAL VALVE REPAIR N/A 05/24/2013   Surgeon: Heriberto London, MD; Service: Open Heart Surgery   MULTIPLE EXTRACTIONS WITH ALVEOLOPLASTY N/A 05/21/2013   Procedure: Extraction of tooth #'s 2,4,18 with alveoloplasty and gross debridement of remaining teeth;  Surgeon: Carol Chroman, DDS;  Location: [facility] OR;  Service: Oral Surgery;  Laterality: N/A;   OPEN REDUCTION INTERNAL FIXATION (ORIF) DISTAL RADIAL FRACTURE Right 03/05/2014   Procedure: OPEN REDUCTION INTERNAL FIXATION (ORIF) RIGHT DISTAL RADIUS FRACTURE ;  Surgeon: Hedy Living, MD;  Location: [facility] OR;  Service: Orthopedics;  Laterality: Right;   TEE WITHOUT CARDIOVERSION  09/24/2012   Procedure: TRANSESOPHAGEAL ECHOCARDIOGRAM (TEE);  Surgeon: Elmyra Haggard, MD;  Location: [facility] ENDOSCOPY;  Service: Cardiovascular;  Laterality:  N/A;     Family History  Problem Relation Age of Onset   Cancer - Other Mother    CAD Mother    Arthritis Mother    CVA Sister    CAD Sister    Seizures Brother    Heart disease Father      Social History   Socioeconomic History   Marital status: Widowed    Spouse name: Not on file   Number of children: 1   Years of education: 9   Highest education level: Not on file  Occupational History   Occupation: Retired  Tobacco Use   Smoking status: Never   Smokeless tobacco: Never  Vaping Use   Vaping status: Never Used  Substance and Sexual Activity   Alcohol  use: No   Drug use: No   Sexual activity: Yes    Birth control/protection: Surgical  Other Topics Concern   Not on file  Social History Narrative  Fun: Shop, read, dance   Denies religious beliefs effecting health care.    Social Drivers of Corporate investment banker Strain: Not on file  Food Insecurity: Not on file  Transportation Needs: Not on file  Physical Activity: Not on file  Stress: Not on file  Social Connections: Not on file  Intimate Partner Violence: Not on file     There were no vitals taken for this visit.  Physical Exam:  Well appearing NAD HEENT: Unremarkable Neck:  No JVD, no thyromegally Lymphatics:  No adenopathy Back:  No CVA tenderness Lungs:  Clear HEART:  Regular rate rhythm, no murmurs, no rubs, no clicks Abd:  soft, positive bowel sounds, no organomegally, no rebound, no guarding Ext:  2 plus pulses, no edema, no cyanosis, no clubbing Skin:  No rashes no nodules Neuro:  CN II through XII intact, motor grossly intact  EKG  DEVICE  Normal device function.  See PaceArt for details.   Assess/Plan:  1. Atrial fib - she has undergone DCCV and is maintaining NSR. Continue amiodarone  200 bid for about 2 more weeks then 200 mg daily. 2. VF arrest - she has had no recurrent ventricular arrhythmias on low dose amiodarone . 3. ICD - her [employer]. Jude ICD is working normally. She is  s/p gen change. 4. Chronic systolic heart failure - her symptoms are class 2 in NSR. She will continue her current meds. 5. HTN - her bp has been fairly well controlled.    Pete Brand Taniela Feltus,MD

## 2024-03-26 NOTE — Telephone Encounter (Signed)
 Patient identification verified by 2 forms. Sims Duck, RN     Called and spoke to patient's granddaughter Miranda per DPR/  Relayed provider recommendations  Miranda aware:  - patient to to be taken Entresto  and  Coreg .  - Miranda states this was already taken care of with patients other cardiologist.

## 2024-04-08 ENCOUNTER — Ambulatory Visit: Admitting: Student

## 2024-04-25 DIAGNOSIS — E782 Mixed hyperlipidemia: Secondary | ICD-10-CM | POA: Diagnosis not present

## 2024-04-25 DIAGNOSIS — Z8679 Personal history of other diseases of the circulatory system: Secondary | ICD-10-CM | POA: Diagnosis not present

## 2024-04-25 DIAGNOSIS — I10 Essential (primary) hypertension: Secondary | ICD-10-CM | POA: Diagnosis not present

## 2024-04-25 DIAGNOSIS — N182 Chronic kidney disease, stage 2 (mild): Secondary | ICD-10-CM | POA: Diagnosis not present

## 2024-05-09 DIAGNOSIS — M170 Bilateral primary osteoarthritis of knee: Secondary | ICD-10-CM | POA: Diagnosis not present

## 2024-05-14 ENCOUNTER — Emergency Department (HOSPITAL_COMMUNITY)

## 2024-05-14 ENCOUNTER — Other Ambulatory Visit: Payer: Self-pay

## 2024-05-14 ENCOUNTER — Encounter (HOSPITAL_COMMUNITY): Payer: Self-pay

## 2024-05-14 ENCOUNTER — Emergency Department (HOSPITAL_COMMUNITY)
Admission: EM | Admit: 2024-05-14 | Discharge: 2024-05-14 | Disposition: A | Discharge status: Home / Self Care | Attending: Emergency Medicine | Admitting: Emergency Medicine

## 2024-05-14 DIAGNOSIS — Z7901 Long term (current) use of anticoagulants: Secondary | ICD-10-CM | POA: Insufficient documentation

## 2024-05-14 DIAGNOSIS — R5383 Other fatigue: Secondary | ICD-10-CM | POA: Diagnosis not present

## 2024-05-14 DIAGNOSIS — M25551 Pain in right hip: Secondary | ICD-10-CM | POA: Insufficient documentation

## 2024-05-14 DIAGNOSIS — M1611 Unilateral primary osteoarthritis, right hip: Secondary | ICD-10-CM | POA: Diagnosis not present

## 2024-05-14 DIAGNOSIS — S79911A Unspecified injury of right hip, initial encounter: Secondary | ICD-10-CM | POA: Diagnosis not present

## 2024-05-14 DIAGNOSIS — Z743 Need for continuous supervision: Secondary | ICD-10-CM | POA: Diagnosis not present

## 2024-05-14 DIAGNOSIS — W19XXXA Unspecified fall, initial encounter: Secondary | ICD-10-CM | POA: Diagnosis not present

## 2024-05-14 DIAGNOSIS — R059 Cough, unspecified: Secondary | ICD-10-CM | POA: Diagnosis not present

## 2024-05-14 DIAGNOSIS — R531 Weakness: Secondary | ICD-10-CM | POA: Diagnosis not present

## 2024-05-14 LAB — CBC WITH DIFFERENTIAL/PLATELET
Abs Immature Granulocytes: 0.03 K/uL (ref 0.00–0.07)
Basophils Absolute: 0 K/uL (ref 0.0–0.1)
Basophils Relative: 0 %
Eosinophils Absolute: 0.1 K/uL (ref 0.0–0.5)
Eosinophils Relative: 1 %
HCT: 30.2 % — ABNORMAL LOW (ref 36.0–46.0)
Hemoglobin: 9.9 g/dL — ABNORMAL LOW (ref 12.0–15.0)
Immature Granulocytes: 1 %
Lymphocytes Relative: 8 %
Lymphs Abs: 0.4 K/uL — ABNORMAL LOW (ref 0.7–4.0)
MCH: 33.1 pg (ref 26.0–34.0)
MCHC: 32.8 g/dL (ref 30.0–36.0)
MCV: 101 fL — ABNORMAL HIGH (ref 80.0–100.0)
Monocytes Absolute: 0.3 K/uL (ref 0.1–1.0)
Monocytes Relative: 6 %
Neutro Abs: 4.3 K/uL (ref 1.7–7.7)
Neutrophils Relative %: 84 %
Platelets: 255 K/uL (ref 150–400)
RBC: 2.99 MIL/uL — ABNORMAL LOW (ref 3.87–5.11)
RDW: 13 % (ref 11.5–15.5)
WBC: 5 K/uL (ref 4.0–10.5)
nRBC: 0 % (ref 0.0–0.2)

## 2024-05-14 LAB — COMPREHENSIVE METABOLIC PANEL WITH GFR
ALT: 19 U/L (ref 0–44)
AST: 24 U/L (ref 15–41)
Albumin: 4 g/dL (ref 3.5–5.0)
Alkaline Phosphatase: 51 U/L (ref 38–126)
Anion gap: 10 (ref 5–15)
BUN: 20 mg/dL (ref 8–23)
CO2: 19 mmol/L — ABNORMAL LOW (ref 22–32)
Calcium: 9.2 mg/dL (ref 8.9–10.3)
Chloride: 102 mmol/L (ref 98–111)
Creatinine, Ser: 1.32 mg/dL — ABNORMAL HIGH (ref 0.44–1.00)
GFR, Estimated: 42 mL/min — ABNORMAL LOW (ref >60)
Glucose, Bld: 93 mg/dL (ref 70–99)
Potassium: 5.4 mmol/L — ABNORMAL HIGH (ref 3.5–5.1)
Sodium: 131 mmol/L — ABNORMAL LOW (ref 135–145)
Total Bilirubin: 0.7 mg/dL (ref 0.0–1.2)
Total Protein: 6.7 g/dL (ref 6.5–8.1)

## 2024-05-14 MED ORDER — OXYCODONE-ACETAMINOPHEN 5-325 MG PO TABS
1.0000 | ORAL_TABLET | Freq: Once | ORAL | Status: AC
  Administered 2024-05-14: 1 via ORAL
  Filled 2024-05-14 (×2): qty 1

## 2024-05-14 MED ORDER — ONDANSETRON HCL 4 MG PO TABS: 4.0000 mg | ORAL_TABLET | Freq: Three times a day (TID) | ORAL | Status: DC | PRN

## 2024-05-14 MED ORDER — ACETAMINOPHEN 325 MG PO TABS
650.0000 mg | ORAL_TABLET | ORAL | Status: DC | PRN
Start: 2024-05-14 — End: 2024-05-15

## 2024-05-14 MED ORDER — OXYCODONE-ACETAMINOPHEN 5-325 MG PO TABS: 1.0000 | ORAL_TABLET | Freq: Two times a day (BID) | ORAL | 0 refills | Status: AC | PRN

## 2024-05-14 MED ORDER — ACETAMINOPHEN 500 MG PO TABS: 1000.0000 mg | ORAL_TABLET | Freq: Four times a day (QID) | ORAL | 0 refills | Status: AC | PRN

## 2024-05-14 NOTE — ED Provider Notes (Signed)
  Physical Exam  BP (!) 152/65 (BP Location: Left Arm)   Pulse (!) 53   Temp 99.1 F (37.3 C) (Oral)   Resp 18   SpO2 100%   Physical Exam  Procedures  Procedures  ED Course / MDM   Clinical Course as of 05/14/24 1933  Tue May 14, 2024  1446 PT comes in with cc of fall. Xray neg. Pain with ambulation - R hip. If CT neg, can go home.  [AN]    Clinical Course User Index [AN] Charlyn Sora, MD   Medical Decision Making Amount and/or Complexity of Data Reviewed Labs: ordered. Radiology: ordered.  Risk OTC drugs. Prescription drug management.   I assumed care of this patient from Dr. Dasie.  Patient had come in with a fall.  Fall was on Saturday.  Patient complaining of pain, x-ray negative, CT ordered.  CT is also negative.  I went to assess the patient.  Patient wanted to go home, however granddaughter came later on and indicated that it would be best if patient is put in rehab.    Fall was on Saturday.  Since then, patient has been relying more on the wheelchair.  She typically uses both wheelchair and walker.  With walking, she has significant discomfort over the right hip.  Upon ambulation attempt in the ER, patient was able to take 2 or 3 steps, but had significant pain.  After speaking with family, we had put in social work consult for placement.  However at 7 PM, I received a message from social work indicating that patient has changed her mind, she wants to leave.  She wants home health.  Home health order has been placed.       Charlyn Sora, MD 05/15/24 [id number]

## 2024-05-14 NOTE — Progress Notes (Signed)
 PT consult pending. CSW will speak with patient about SNF placement if she agrees and meets criteria.

## 2024-05-14 NOTE — ED Notes (Signed)
 TOA attempted, pt was only able to take about 2 steps due to the pain. Dr. Charlyn informed.

## 2024-05-14 NOTE — ED Provider Notes (Signed)
  EMERGENCY DEPARTMENT AT [facility] Provider Note   CSN: [id number] Arrival date & time: 05/14/24  1148     Patient presents with: Fall and Hip Pain   [name] is a 78 y.o. female.   78 year old female here mechanical fall several days ago onto her right hip.  Did not strike her head did not lose consciousness.  She is on Eliquis .  Denies any chest abdomen or neck pain.  They says she is unable to really bear weight on her right hip since this happened.  Denies any distal numbness or tingling to her right foot.  Called EMS was transported here       Prior to Admission medications   Medication Sig Start Date End Date Taking? Authorizing Provider  acetaminophen  (TYLENOL ) 500 MG tablet Take 1,000 mg by mouth every 6 (six) hours as needed (back/neck pain). For pain    [provider]  amiodarone  (PACERONE ) 200 MG tablet Take 1 tablet (200 mg total) by mouth daily. Patient taking differently: Take 200 mg by mouth 2 (two) times daily. 02/22/24   Lesia Ozell Barter, PA-C  carvedilol  (COREG ) 6.25 MG tablet Take 1 tablet (6.25 mg total) by mouth 2 (two) times daily. 02/22/24   Lesia Ozell Barter, PA-C  ELIQUIS  5 MG TABS tablet Take 1 tablet (5 mg total) by mouth 2 (two) times daily. 02/22/24   Lesia Ozell Barter, PA-C  ENTRESTO  49-51 MG Take 1 tablet by mouth 2 (two) times daily. 02/23/24   [provider]  gabapentin  (NEURONTIN ) 100 MG capsule Take 200 mg by mouth at bedtime. 11/17/21   [provider]  Glycerin-Hypromellose-PEG 400 (VISINE DRY EYE) 0.2-0.2-1 % SOLN Place 1-2 drops into both eyes 3 (three) times daily as needed (dry/irritated eyes).    [provider]  levothyroxine  (SYNTHROID ) 100 MCG tablet Take 100 mcg by mouth daily before breakfast. 07/08/22   [provider]  oxyCODONE -acetaminophen  (PERCOCET/ROXICET) 5-325 MG tablet Take 1 tablet by mouth every 6 (six) hours as needed for severe pain. 07/21/23    Cuthriell, Jonathan D, PA-C  PARoxetine  (PAXIL ) 30 MG tablet Take 60 mg by mouth every evening.    [provider]  pravastatin  (PRAVACHOL ) 40 MG tablet TAKE 1 TABLET BY MOUTH EVERY DAY IN THE EVENING 09/20/23   Waddell Danelle ORN, MD  sodium chloride  (OCEAN) 0.65 % SOLN nasal spray Place 1 spray into both nostrils as needed (dry nasal passages).    [provider]  spironolactone  (ALDACTONE ) 25 MG tablet Take 12.5 mg by mouth daily. 01/18/22   [provider]    Allergies: Morphine  and codeine, Codeine, Demerol [meperidine hcl], Meperidine hcl, and Pollen extract    Review of Systems  All other systems reviewed and are negative.   Updated Vital Signs BP (!) 147/66 (BP Location: Left Arm)   Pulse (!) 50   Temp 98.7 F (37.1 C) (Oral)   Resp 17   SpO2 100%   Physical Exam Vitals and nursing note reviewed.  Constitutional:      General: She is not in acute distress.    Appearance: Normal appearance. She is well-developed. She is not toxic-appearing.  HENT:     Head: Normocephalic and atraumatic.  Eyes:     General: Lids are normal.     Conjunctiva/sclera: Conjunctivae normal.     Pupils: Pupils are equal, round, and reactive to light.  Neck:     Thyroid : No thyroid  mass.     Trachea: No  tracheal deviation.  Cardiovascular:     Rate and Rhythm: Normal rate and regular rhythm.     Heart sounds: Normal heart sounds. No murmur heard.    No gallop.  Pulmonary:     Effort: Pulmonary effort is normal. No respiratory distress.     Breath sounds: Normal breath sounds. No stridor. No decreased breath sounds, wheezing, rhonchi or rales.  Abdominal:     General: There is no distension.     Palpations: Abdomen is soft.     Tenderness: There is no abdominal tenderness. There is no rebound.  Musculoskeletal:        General: No tenderness. Normal range of motion.     Cervical back: Normal range of motion and neck supple.       Legs:     Comments: Neurovascular  status intact at right foot.  Pain with range of motion at right hip.  Some shortening but no rotation  Skin:    General: Skin is warm and dry.     Findings: No abrasion or rash.  Neurological:     Mental Status: She is alert and oriented to person, place, and time. Mental status is at baseline.     GCS: GCS eye subscore is 4. GCS verbal subscore is 5. GCS motor subscore is 6.     Cranial Nerves: No cranial nerve deficit.     Sensory: No sensory deficit.     Motor: Motor function is intact.  Psychiatric:        Attention and Perception: Attention normal.        Speech: Speech normal.        Behavior: Behavior normal.     (all labs ordered are listed, but only abnormal results are displayed) Labs Reviewed - No data to display  EKG: None  Radiology: No results found.   Procedures   Medications Ordered in the ED - No data to display  Clinical Course as of 05/18/24 0729  Tue May 14, 2024  1446 PT comes in with cc of fall. Xray neg. Pain with ambulation - R hip. If CT neg, can go home.  [AN]    Clinical Course User Index [AN] Charlyn Sora, MD                                 Medical Decision Making Amount and/or Complexity of Data Reviewed Labs: ordered. Radiology: ordered.  Risk OTC drugs. Prescription drug management.   Plain x-rays of patient's right hip are without evidence of fracture.  Will require a CT scan and signed out to Dr. Charlyn     Final diagnoses:  None    ED Discharge Orders     None          Dasie Faden, MD 05/18/24 0730

## 2024-05-14 NOTE — Care Management (Addendum)
 ED RNCM met with patient and Justine daughter [name] [phone] to discuss SNF placement. Patient and grand daughter then stated the patient had changed her mind and would like to go home with [facility] services.  Discussed HH services explained that we would send out referral to several [facility] companies due to [facility] coverage limitations. They are agreeable referral was sent to [facility], [facility], [facility], and [facility].  They are agreeable. Grand daughter states that there are steps at the residence that she will not be able to transport patient home. Patient will be transported home by [facility].  Explained to patient and TOC/ICM will follow up with the [facility] company in the am.  Updated Nursing Staff, and Dr. Charlyn.

## 2024-05-14 NOTE — ED Notes (Signed)
 Cena Pollack Granddaughter Emergency Contact [phone]  Gdaughter would like update.

## 2024-05-14 NOTE — ED Provider Notes (Signed)
 Emergency Medicine Observation Re-evaluation Note  [name] is a 78 y.o. female, seen on rounds today.  Pt initially presented to the ED for complaints of Fall and Hip Pain  Current plan is discharge.  Family has the discharge after talking to social work.   Charlyn Sora, MD 05/14/24 [phone]

## 2024-05-14 NOTE — Discharge Instructions (Addendum)
 We saw you in the ER after you had a fall. All the imaging results are normal, no fractures seen.   It is possible that your pain is because of bruised your bone, muscle, soft tissue injury. Initially, we had placed social work consult to see if you can be placed to a rehab facility, but you have requested discharge with home health evaluation.  Please call the number provided to follow-up with the orthopedic surgery.  Weightbearing is allowed as tolerated.  Return to the ER if you have any worsening pain.  Please regarding possible to avoid falls.

## 2024-05-14 NOTE — ED Notes (Signed)
 [facility] called for transport to address in chart. 2nd on list.

## 2024-05-14 NOTE — ED Triage Notes (Signed)
 Pt arrives via EMS from home. Pt reports she accidentally slipped and fell on Saturday. Pt c/o pain to right hip. States she has not been able to walk on it. CMS is intact. No shortening or rotation noted. Pt does take eliquis , she denies hitting her head. Pt is AxOx4.

## 2024-05-15 ENCOUNTER — Telehealth: Payer: Self-pay | Admitting: Surgery

## 2024-05-15 NOTE — Telephone Encounter (Signed)
 ED RNCM contacted patient and Justine daughter to inform them that [facility] accepted referral and they would be notified by phone when they can arrange the initial home visit.  Verbalized understand and are agreeable.

## 2024-05-23 ENCOUNTER — Telehealth: Payer: Self-pay | Admitting: *Deleted

## 2024-05-23 NOTE — Telephone Encounter (Signed)
 Pt grand daughter, Cena called regarding not having heard from [facility] for home health services arranged on 7/30. RNCM reached out to liaison, Darleene to find that they had the wrong pt name and could not start service.  Darleene states he contacted referring case manager for clarification, but did not hear back.  Darleene accepted referral and plans to call family today.  RNCM returned call to Miranda to give her the message.  Rosalin Buster J. Debarah, BSN, RN, [facility]  IP Care Management  Nurse Case Manager  [facility] Emergency Departments  Operative Services  [phone]

## 2024-07-30 ENCOUNTER — Other Ambulatory Visit: Payer: Self-pay | Admitting: Internal Medicine

## 2024-07-30 ENCOUNTER — Other Ambulatory Visit: Payer: Self-pay | Admitting: Student

## 2024-07-30 DIAGNOSIS — I480 Paroxysmal atrial fibrillation: Secondary | ICD-10-CM

## 2024-08-01 ENCOUNTER — Telehealth: Payer: Self-pay | Admitting: Internal Medicine

## 2024-08-01 MED ORDER — PRAVASTATIN SODIUM 40 MG PO TABS: ORAL_TABLET | ORAL | 2 refills | Status: AC

## 2024-08-01 NOTE — Telephone Encounter (Signed)
*  STAT* If patient is at the pharmacy, call can be transferred to refill team.   1. Which medications need to be refilled? (please list name of each medication and dose if known) pravastatin  (PRAVACHOL ) 40 MG tablet    2. Would you like to learn more about the convenience, safety, & potential cost savings by using the [facility]?    3. Are you open to using the [facility] (Type [facility].  ).   4. Which pharmacy/location (including street and city if local pharmacy) is medication to be sent to?  [id number] - [place], [place] - [address]     5. Do they need a 30 day or 90 day supply? 90 day

## 2024-08-01 NOTE — Telephone Encounter (Signed)
 RX sent in

## 2024-08-09 DIAGNOSIS — E782 Mixed hyperlipidemia: Secondary | ICD-10-CM | POA: Diagnosis not present

## 2024-08-09 DIAGNOSIS — I4891 Unspecified atrial fibrillation: Secondary | ICD-10-CM | POA: Diagnosis not present

## 2024-08-09 DIAGNOSIS — I10 Essential (primary) hypertension: Secondary | ICD-10-CM | POA: Diagnosis not present

## 2024-08-09 DIAGNOSIS — I5020 Unspecified systolic (congestive) heart failure: Secondary | ICD-10-CM | POA: Diagnosis not present

## 2024-08-18 ENCOUNTER — Other Ambulatory Visit: Payer: Self-pay | Admitting: Student

## 2024-08-18 DIAGNOSIS — I480 Paroxysmal atrial fibrillation: Secondary | ICD-10-CM

## 2024-08-19 ENCOUNTER — Encounter: Payer: Self-pay | Admitting: Physician Assistant

## 2024-08-19 ENCOUNTER — Ambulatory Visit: Attending: Physician Assistant | Admitting: Cardiology

## 2024-08-19 VITALS — BP 112/62 | HR 97 | Ht 64.0 in | Wt 124.1 lb

## 2024-08-19 DIAGNOSIS — I4901 Ventricular fibrillation: Secondary | ICD-10-CM | POA: Diagnosis not present

## 2024-08-19 DIAGNOSIS — I480 Paroxysmal atrial fibrillation: Secondary | ICD-10-CM

## 2024-08-19 DIAGNOSIS — I4891 Unspecified atrial fibrillation: Secondary | ICD-10-CM

## 2024-08-19 DIAGNOSIS — Z95810 Presence of automatic (implantable) cardiac defibrillator: Secondary | ICD-10-CM | POA: Diagnosis not present

## 2024-08-19 LAB — CUP PACEART INCLINIC DEVICE CHECK
Battery Remaining Longevity: 80 mo
Brady Statistic RV Percent Paced: 1.9 %
Date Time Interrogation Session: 20251103160046
HighPow Impedance: 40.4338
Implantable Lead Connection Status: 753985
Implantable Lead Implant Date: 20101124
Implantable Lead Location: 753860
Implantable Pulse Generator Implant Date: 20230419
Lead Channel Impedance Value: 337.5 Ohm
Lead Channel Pacing Threshold Amplitude: 1.25 V
Lead Channel Pacing Threshold Amplitude: 1.25 V
Lead Channel Pacing Threshold Pulse Width: 0.5 ms
Lead Channel Pacing Threshold Pulse Width: 0.5 ms
Lead Channel Sensing Intrinsic Amplitude: 11.7 mV
Lead Channel Setting Pacing Amplitude: 2.5 V
Lead Channel Setting Pacing Pulse Width: 0.5 ms
Lead Channel Setting Sensing Sensitivity: 0.5 mV
Pulse Gen Serial Number: 9995581
Zone Setting Status: 755011

## 2024-08-19 MED ORDER — METOPROLOL SUCCINATE ER 25 MG PO TB24: 25.0000 mg | ORAL_TABLET | Freq: Every day | ORAL | 3 refills | Status: AC

## 2024-08-19 NOTE — Patient Instructions (Signed)
 Medication Instructions:  Start Metoprolol  Succinate (Toprol  XL) 25 mg take one tablet daily *If you need a refill on your cardiac medications before your next appointment, please call your pharmacy*  Lab Work: Today- CBC, CMET, TSH If you have labs (blood work) drawn today and your tests are completely normal, you will receive your results only by: MyChart Message (if you have MyChart) OR A paper copy in the mail If you have any lab test that is abnormal or we need to change your treatment, we will call you to review the results.   Follow-Up: At [facility], you and your health needs are our priority.  As part of our continuing mission to provide you with exceptional heart care, our providers are all part of one team.  This team includes your primary Cardiologist (physician) and Advanced Practice Providers or APPs (Physician Assistants and Nurse Practitioners) who all work together to provide you with the care you need, when you need it.  Your next appointment:   1 month(s)  Provider:   You will see one of the following Advanced Practice Providers on your designated Care Team:   Charlies Arthur, [place] Ozell Jodie Passey, PA-C Suzann Riddle, NP Daphne Barrack, NP Artist Pouch, PA-C

## 2024-08-19 NOTE — Progress Notes (Addendum)
 Electrophysiology Office Note:   ID:  [name], [name] 11/23/1945, MRN [id number]  Primary Cardiologist: None Electrophysiologist: Danelle Birmingham, MD      History of Present Illness:   [name] is a 78 y.o. female with h/o PAF, chronic systolic CHF, VF arrest, MR, on chronic Amiodarone  seen today for routine electrophysiology followup.   Patient was seen by Jodie Passey PA-C in May of this year, reported increased fatigue and shortness of breath. EKG that visit noted afib (onset felt to have been in March). Patient arranged for outpatient cardioversion, 4 weeks after clinic visit to ensure uninterrupted Eliquis . Patient has previously followed with Dr. Levern and it was noted at May visit that she had been unable to navigate the stairs at his office. Gen cards [facility] referral placed, though patient not yet seen. Patient then saw Dr. Birmingham for follow up in June a/p cardioversion, was maintaining sinus rhythm at that time on Amiodarone .   Since last being seen in our clinic the patient has developed recurrent afib, found by Dr. Levern at recent office visit. She was also noted to have LE edema and symptoms of orthopnea. Dr. Levern increased Amiodarone  to 200mg  BID and added 20mg  lasix  daily. Since starting Lasix , edema has resolved and patient feels her breathing at night is back to baseline. She endorses chronic fatigue today but does not feel that it is appreciably worse than earlier this year. In fact, other than a nagging nighttime cough, patient states that overall she feels better now than earlier in the year. Patient is minimally active at baseline due to chronic pain and spends most of her time in the house.  She states that she is able to complete ADLs and simple tasks around the house without dyspnea or palpitations. Chronic fatigue and pain are limiting factors. she otherwise denies chest pain, palpitations, nausea, vomiting, dizziness, syncope, weight gain.   Regarding Eliquis   compliance, patient reports missing several doses ~2 weeks ago after running out and having to wait on her pharmacy to get additional supply in stock. Otherwise takes BID as prescribed.  Per patient, she has been off Coreg  since seeing Dr. Birmingham in June.   Review of systems complete and found to be negative unless listed in HPI.   EP Information / Studies Reviewed:    EKG is ordered today. Personal review as below.  EKG Interpretation Date/Time:  Monday August 19 2024 14:35:40 EST Ventricular Rate:  97 PR Interval:    QRS Duration:  120 QT Interval:  398 QTC Calculation: 505 R Axis:   9  Text Interpretation: Atrial fibrillation Atrial fibrillation has replaced Junctional rhythm Vent. rate has increased BY  36 BPM QT has lengthened Confirmed by Trudy Birmingham [phone]) on 08/19/2024 3:16:35 PM    ICD Interrogation-  reviewed in detail today,  See [facility] report.  Arrhythmia/Device History Abbott single chamber ICD implanted 09/09/2009, gen change 01/2022   Physical Exam:   VS:  BP 112/62   Pulse 97   Ht 5' 4 (1.626 m)   Wt 124 lb 1.6 oz (56.3 kg)   SpO2 99%   BMI 21.30 kg/m    Wt Readings from Last 3 Encounters:  08/19/24 124 lb 1.6 oz (56.3 kg)  03/25/24 138 lb (62.6 kg)  03/14/24 165 lb (74.8 kg)     GEN: No acute distress. Frail appearing. NECK: No JVD; No carotid bruits CARDIAC: Irregularly irregular rate and rhythm, no murmurs, rubs, gallops RESPIRATORY:  Clear to auscultation without rales, wheezing or  rhonchi  ABDOMEN: Soft, non-tender, non-distended EXTREMITIES:  No edema; No deformity   ASSESSMENT AND PLAN:    Aborted Cardiac Arrest  s/p Abbott single chamber ICD  euvolemic today Normal ICD function. No ventricular arrhythmia.  See Elisabeth Art report Prefers in-clinic device checks only. Will return Amiodarone  to 200mg  daily (see below).  Persistent afib Patient s/p DCCV in May of this year. Was maintaining NSR at follow up a few weeks later with Dr.  Waddell. Now back in afib. Unknown time of recurrence with single lead ICD but rate histograms do not suggest significant RVR. Given that patient has now failed DCCV on chronic Amiodarone , has controlled ventricular rates on device histogram, and no significant cardiac symptoms (has chronic fatigue that pre-dates onset of afib), would favor long-term rate control rather than further attempts to restore NSR. Unlikely to achieve long-term rhythm control.  Decrease Amiodarone  back to 200mg  daily. Start Toprol -XL 25mg  once daily to assist in rate control. Continue Eliquis  5mg  BID. Reinforced importance of compliance and need to request early refills.  Check CBC, CMET, TSH today.   NICM Chronic HFrEF Patient followed by Dr. Levern. Recently found with recurrent afib and concurrent peripheral edema with PND. Now on Lasix  20mg  with resolution of edema. Thoracic impedence shows increasing volume prior to visit with Dr. Levern, now back to baseline on Lasix .  Continue current GDMT regimen: Enresto 49-51mg  BID, Spironolactone  12.5mg  daily, Lasix  20mg  per Dr. Levern. Start Toprol  XL 25mg  once daily as above.  Disposition:   Follow up with EP APP in 4 weeks. Long-term, patient will transition to Dr. Almetta from Dr. Waddell following his retirement.   Signed, Artist Pouch, PA-C

## 2024-08-20 ENCOUNTER — Ambulatory Visit: Payer: Self-pay | Admitting: Cardiology

## 2024-08-20 LAB — COMPREHENSIVE METABOLIC PANEL WITH GFR
ALT: 25 IU/L (ref 0–32)
AST: 41 IU/L — ABNORMAL HIGH (ref 0–40)
Albumin: 4.7 g/dL (ref 3.8–4.8)
Alkaline Phosphatase: 78 IU/L (ref 49–135)
BUN/Creatinine Ratio: 19 (ref 12–28)
BUN: 27 mg/dL (ref 8–27)
Bilirubin Total: 0.4 mg/dL (ref 0.0–1.2)
CO2: 23 mmol/L (ref 20–29)
Calcium: 9.7 mg/dL (ref 8.7–10.3)
Chloride: 97 mmol/L (ref 96–106)
Creatinine, Ser: 1.43 mg/dL — ABNORMAL HIGH (ref 0.57–1.00)
Globulin, Total: 2 g/dL (ref 1.5–4.5)
Glucose: 95 mg/dL (ref 70–99)
Potassium: 5.1 mmol/L (ref 3.5–5.2)
Sodium: 136 mmol/L (ref 134–144)
Total Protein: 6.7 g/dL (ref 6.0–8.5)
eGFR: 38 mL/min/1.73 — ABNORMAL LOW (ref >59)

## 2024-08-20 LAB — CBC
Hematocrit: 30.8 % — ABNORMAL LOW (ref 34.0–46.6)
Hemoglobin: 9.7 g/dL — ABNORMAL LOW (ref 11.1–15.9)
MCH: 33.1 pg — ABNORMAL HIGH (ref 26.6–33.0)
MCHC: 31.5 g/dL (ref 31.5–35.7)
MCV: 105 fL — ABNORMAL HIGH (ref 79–97)
Platelets: 291 x10E3/uL (ref 150–450)
RBC: 2.93 x10E6/uL — ABNORMAL LOW (ref 3.77–5.28)
RDW: 13.1 % (ref 11.7–15.4)
WBC: 5 x10E3/uL (ref 3.4–10.8)

## 2024-08-20 LAB — TSH: TSH: 0.221 u[IU]/mL — ABNORMAL LOW (ref 0.450–4.500)

## 2024-09-23 ENCOUNTER — Ambulatory Visit: Admitting: Student

## 2024-10-10 ENCOUNTER — Other Ambulatory Visit: Payer: Self-pay | Admitting: Student

## 2024-10-10 DIAGNOSIS — I480 Paroxysmal atrial fibrillation: Secondary | ICD-10-CM

## 2024-10-11 NOTE — Telephone Encounter (Signed)
 Prescription refill request for Eliquis  received. Indication: A-Fib Last office visit: 08/19/24 Scr: 1.43 08/19/24 Care Everywhere Age: 78 Weight: 56.3 Pt has passed 2 out of 3 Parameters. Please advise on a refill

## 2024-10-25 ENCOUNTER — Ambulatory Visit: Admitting: Student
